# Patient Record
Sex: Female | Born: 1955 | Race: Black or African American | Hispanic: Yes | Marital: Single
Health system: Midwestern US, Community
[De-identification: ages and names within clinical notes are randomized; demographics above are authoritative.]

## PROBLEM LIST (undated history)

## (undated) DIAGNOSIS — I6350 Cerebral infarction due to unspecified occlusion or stenosis of unspecified cerebral artery: Secondary | ICD-10-CM

## (undated) DIAGNOSIS — G894 Chronic pain syndrome: Secondary | ICD-10-CM

## (undated) DIAGNOSIS — E7800 Pure hypercholesterolemia, unspecified: Secondary | ICD-10-CM

## (undated) DIAGNOSIS — M545 Low back pain, unspecified: Secondary | ICD-10-CM

## (undated) DIAGNOSIS — R627 Adult failure to thrive: Secondary | ICD-10-CM

## (undated) DIAGNOSIS — F1120 Opioid dependence, uncomplicated: Secondary | ICD-10-CM

## (undated) DIAGNOSIS — D571 Sickle-cell disease without crisis: Secondary | ICD-10-CM

## (undated) DIAGNOSIS — D5720 Sickle-cell/Hb-C disease without crisis: Secondary | ICD-10-CM

## (undated) DIAGNOSIS — D649 Anemia, unspecified: Secondary | ICD-10-CM

## (undated) DIAGNOSIS — F1010 Alcohol abuse, uncomplicated: Secondary | ICD-10-CM

## (undated) DIAGNOSIS — I639 Cerebral infarction, unspecified: Secondary | ICD-10-CM

## (undated) DIAGNOSIS — Z813 Family history of other psychoactive substance abuse and dependence: Secondary | ICD-10-CM

## (undated) DIAGNOSIS — Z9181 History of falling: Secondary | ICD-10-CM

## (undated) DIAGNOSIS — I10 Essential (primary) hypertension: Secondary | ICD-10-CM

## (undated) DIAGNOSIS — K589 Irritable bowel syndrome without diarrhea: Secondary | ICD-10-CM

## (undated) DIAGNOSIS — E560 Deficiency of vitamin E: Secondary | ICD-10-CM

## (undated) DIAGNOSIS — R471 Dysarthria and anarthria: Secondary | ICD-10-CM

## (undated) DIAGNOSIS — K219 Gastro-esophageal reflux disease without esophagitis: Secondary | ICD-10-CM

## (undated) DIAGNOSIS — M2060 Acquired deformities of toe(s), unspecified, unspecified foot: Secondary | ICD-10-CM

## (undated) DIAGNOSIS — E538 Deficiency of other specified B group vitamins: Secondary | ICD-10-CM

## (undated) DIAGNOSIS — R569 Unspecified convulsions: Secondary | ICD-10-CM

## (undated) DIAGNOSIS — E559 Vitamin D deficiency, unspecified: Secondary | ICD-10-CM

## (undated) DIAGNOSIS — M48061 Spinal stenosis, lumbar region without neurogenic claudication: Secondary | ICD-10-CM

## (undated) DIAGNOSIS — G8929 Other chronic pain: Secondary | ICD-10-CM

## (undated) DIAGNOSIS — IMO0002 Reserved for concepts with insufficient information to code with codable children: Secondary | ICD-10-CM

## (undated) DIAGNOSIS — R634 Abnormal weight loss: Secondary | ICD-10-CM

## (undated) DIAGNOSIS — R928 Other abnormal and inconclusive findings on diagnostic imaging of breast: Secondary | ICD-10-CM

## (undated) HISTORY — DX: Dysarthria and anarthria: R47.1

## (undated) HISTORY — DX: Sickle-cell disease without crisis: D57.1

## (undated) HISTORY — DX: Anemia, unspecified: D64.9

## (undated) HISTORY — DX: Acquired deformities of toe(s), unspecified, unspecified foot: M20.60

## (undated) HISTORY — DX: Vitamin D deficiency, unspecified: E55.9

## (undated) HISTORY — DX: Gastro-esophageal reflux disease without esophagitis: K21.9

## (undated) HISTORY — PX: ABDOMINAL HYSTERECTOMY: SHX81

## (undated) HISTORY — DX: Family history of other psychoactive substance abuse and dependence: Z81.3

## (undated) HISTORY — DX: Alcohol abuse, uncomplicated: F10.10

## (undated) HISTORY — DX: Deficiency of vitamin E: E56.0

## (undated) HISTORY — DX: Irritable bowel syndrome, unspecified: K58.9

## (undated) HISTORY — DX: Essential (primary) hypertension: I10

## (undated) HISTORY — DX: History of falling: Z91.81

## (undated) HISTORY — DX: Deficiency of other specified B group vitamins: E53.8

## (undated) HISTORY — DX: Unspecified convulsions: R56.9

## (undated) HISTORY — DX: Sickle-cell/Hb-C disease without crisis: D57.20

## (undated) HISTORY — DX: Cerebral infarction, unspecified: I63.9

## (undated) MED ORDER — METHOCARBAMOL 750 MG TAB
750 mg | ORAL_TABLET | Freq: Four times a day (QID) | ORAL | Status: AC
Start: ? — End: 2011-09-23

---

## 2011-01-18 NOTE — ED Provider Notes (Signed)
MEDICATION ADMINISTRATION SUMMARY              Drug Name: Levaquin, Dose Ordered: 750 mg, Route: IV Med Infusion,         Status: Given, Time: 15:55 01/18/2011,          Drug Name: Ibuprofen, Dose Ordered: 400 mg, Route: Oral, Status:         Given, Time: 14:30 01/18/2011,          Drug Name: *Sodium Chloride 0.9%, Intravenous, Dose Ordered: 1000 mL,         Route: IV Fluid, Status: Given, Time: 14:30 01/18/2011,          Drug Name: Acetaminophen, Dose Ordered: 975 mg, Route: Oral, Status:         Given, Time: 14:30 01/18/2011, *Additional information available in         notes, Detailed record available in Medication Service section.       KNOWN ALLERGIES   Penicillins       TRIAGE Lucia Bitter Jan 18, 2011 13:04 MNL1)   PATIENT: NAME: Sofie Rower, AGE: 56, GENDER: female, DOB: [birth year]         [birth year], TIME OF GREET: Wed Jan 18, 2011 12:59, SSN: [id number],         KG WEIGHT: 45.4, HEIGHT: 165cm, MEDICAL RECORD NUMBER: [phone],         ACCOUNT NUMBER: [id number], PCP: Pt Denies,. Lucia Bitter Jan 18, 2011 13:04         MNL1)   ADMISSION: URGENCY: 2, AMBULANCE: [facility], TRANSPORT:         Ambulance - BLS, DEPT: Emergency, BED: 2ED 20Iso. Lucia Bitter Jan 18, 2011         13:04 MNL1)   COMPLAINT:  Medic Feels Sick. Lucia Bitter Jan 18, 2011 13:04 MNL1)   PRESENTING COMPLAINT:  Headache, generalized body aches for 2         weeks getting progressively worse. (13:13 MNL1)   PAIN: Patient complains of pain, On a scale 0-10 patient rates         pain as 5. (13:13 MNL1)   LMP: LMP: Hysterectomy. (13:13 MNL1)   TB SCREENING: TB screen negative for this patient. (13:13         MNL1)   ABUSE SCREENING: Patient denies physical abuse or threats. (13:13         MNL1)   FALL RISK: Patient has a low risk of falling. (13:13 MNL1)   SUICIDAL IDEATION: Suicidal ideation is not present. (13:13         MNL1)   ADVANCE DIRECTIVES: Patient does not have advance directives.         (13:13 MNL1)   PROVIDERS: TRIAGE NURSE: Trecia Rogers, RN, CEN. Lucia Bitter Jan 18, 2011 13:04 MNL1)       PRESENTING PROBLEM (13:04 MNL1)      Presenting problems: Fever, Body Aches (general).       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       CURRENT MEDICATIONS (13:14 MNL1)   Patient not taking meds       MEDICATION SERVICE   Acetaminophen:  Order: Acetaminophen - Dose: 975 mg :         Oral         Ordered by: Zola Button, PA-C         Entered by: Zola Button, PA-C Wed Jan 18, 2011 14:15 ,  Acknowledged by: Trecia Rogers, RN, CEN Wed Jan 18, 2011 14:28         Documented as given by: Trecia Rogers, RN, CEN Wed Jan 18, 2011         14:30          Patient, Medication, Dose, Route and Time verified prior to         administration.          Amount given: 975mg , Site: Medication administered P.O., Correct         patient, time, route, dose and medication confirmed prior to         administration, Patient advised of actions and side-effects prior to         administration, Allergies confirmed and medications reviewed prior to         administration, Patient in position of comfort, Side rails up, Cart         in lowest position, Call light in reach.   Ibuprofen:  Order: Ibuprofen - Dose: 400 mg : Oral         Ordered by: Zola Button, PA-C         Entered by: Zola Button, PA-C Wed Jan 18, 2011 14:15 ,          Acknowledged by: Trecia Rogers, RN, CEN Wed Jan 18, 2011 14:28         Documented as given by: Trecia Rogers, RN, CEN Wed Jan 18, 2011         14:30          Patient, Medication, Dose, Route and Time verified prior to         administration.          Amount given: 400mg , Site: Medication administered P.O., Correct         patient, time, route, dose and medication confirmed prior to         administration, Patient advised of actions and side-effects prior to         administration, Allergies confirmed and medications reviewed prior to         administration, Patient in position of comfort, Side rails up, Cart          in lowest position, Call light in reach.   Levaquin:  Order: Levaquin (Levofloxacin) - Dose: 750         mg : IV Med Infusion         Ordered by: Zola Button, PA-C         Entered by: Zola Button, PA-C Wed Jan 18, 2011 15:36 ,          Acknowledged by: Trecia Rogers, RN, CEN Wed Jan 18, 2011 15:40         Documented as given by: Trecia Rogers, RN, CEN Wed Jan 18, 2011         15:55          Patient, Medication, Dose, Route and Time verified prior to         administration.          Amount given: 750mg , IV SITE #1 into left forearm, IV SITE #1 IVPB         or drip, initial infusion, Premixed, via pump tubing, on an IV pump,         at 100 ml/hr, Connections checked prior to administration, Line         traced prior to administration, Catheter placement confirmed via  flush prior to administration, IV site without signs or symptoms of         infiltration during medication administration, No swelling during         administration, No drainage during administration, IV flushed after         administration, Correct patient, time, route, dose and medication         confirmed prior to administration, Patient advised of actions and         side-effects prior to administration, Allergies confirmed and         medications reviewed prior to administration, Patient in position of         comfort, Side rails up, Cart in lowest position, Call light in reach.    : Follow Up : _IV SITE #1:_, Medication infusion discontinued,         on Wed Jan 18, 2011 17:40, Total infusion time IV site 1 1 hour, 45         minutes, ., Total amount infused: . (17:40 MNL1)   Sodium Chloride 0.9%, Intravenous:  Order: Sodium Chloride 0.9%,         Intravenous (Sodium Chloride) - Dose: 1000 mL : IV Fluid         Notes: For Hydration         Ordered by: Zola Button, PA-C         Entered by: Zola Button, PA-C Wed Jan 18, 2011 14:15 ,          Acknowledged by: Trecia Rogers, RN, CEN Wed Jan 18, 2011 14:28          Documented as given by: Trecia Rogers, RN, CEN Wed Jan 18, 2011         14:30          Patient, Medication, Dose, Route and Time verified prior to         administration.          Amount given: , IV SITE #1 into left forearm, IV SITE #1 IV         fluids established, IV SITE #1 1st bag hung, amount 1 Liter, IV SITE         #1 bolus of 1000 ml established, IV SITE #1 Rate of bolus, wide open,         via primary tubing, Connections checked prior to administration, Line         traced prior to administration, Catheter placement confirmed via         flush prior to administration, IV site without signs or symptoms of         infiltration during medication administration, No swelling during         administration, No drainage during administration, IV flushed after         administration, Correct patient, time, route, dose and medication         confirmed prior to administration, Patient advised of actions and         side-effects prior to administration, Allergies confirmed and         medications reviewed prior to administration, Patient in position of         comfort, Side rails up, Cart in lowest position, Call light in reach.    : Follow Up : _IV SITE #1:_, IV fluid infusion discontinued, on         Wed Jan 18, 2011 16:05, Total fluid hydration time IV site 1 1 hour,  35 minutes, ., Total amount infused: . (16:05 MNL1)       ORDERS   BLOOD CULTURE:  Ordered for: Truddie Crumble, MD, Rob         Status: Active. (14:00 MNL1)      Ordered for: Truddie Crumble, MD, Rob         Status: Active. (14:00 MNL1)   Apply O2 if sat &lt;92% and/or symptomatic:  Ordered for: Truddie Crumble,         MD, Rob         Status: Done by Dalene Carrow RN, CEN, Marvetta Gibbons Jan 18, 2011 14:27.         (14:00 MNL1)   CBC, MANUAL DIFFERENTIAL:  Ordered for: Truddie Crumble, MD, Rob         Status: Active. (14:00 MNL1)   COMPREHENSIVE METABOLIC PANEL:  Ordered for: Truddie Crumble, MD, Rob          Status: Done by System Wed Jan 18, 2011 15:41. (14:00 MNL1)   Acetaminophen/ibuprofen Protocol:  Ordered for: Truddie Crumble, MD,         Rob         Status: Done by Dalene Carrow RN, CEN, Marvetta Gibbons Jan 18, 2011 14:27.         (14:00 MNL1)   CHEST 2 VIEWS:  Ordered for: Truddie Crumble, MD, Rob         Status: Active. (14:00 MNL1)   IV- Saline Lock:  Ordered for: Wenda Overland, DO, Jennifer         Status: Done by Dalene Carrow RN, CEN, Marvetta Gibbons Jan 18, 2011 14:54.         (14:14 NVR)   Cardiac Monitor:  Ordered for: Wenda Overland, DO, Jennifer         Status: Done by Dalene Carrow RN, CEN, Marvetta Gibbons Jan 18, 2011 14:28.         (14:14 NVR)   HAND HELD NEBULIZER:  Ordered for: Wenda Overland, DO, Jennifer         Status: Active. (14:14 NVR)   CBC, AUTOMATED DIFFERENTIAL:  Ordered for: Wenda Overland, DO,         Jennifer         Status: Done by System Wed Jan 18, 2011 15:12. (14:14 NVR)   LIPASE:  Ordered for: Wenda Overland, DO, Jennifer         Status: Done by System Wed Jan 18, 2011 15:32. (14:14 NVR)   BP Monitor:  Ordered for: Wenda Overland, DO, Jennifer         Status: Done by Dalene Carrow RN, CEN, Marvetta Gibbons Jan 18, 2011 14:27.         (14:14 NVR)   O2 sat Monitor:  Ordered for: Wenda Overland, DO, Victorino Dike         Status: Done by Dalene Carrow RN, CEN, Marvetta Gibbons Jan 18, 2011 14:28.         (14:14 NVR)   Urine dip (send for lab U/A if positive):  Ordered for: Wenda Overland, DO, Jennifer         Status: Done by Dalene Carrow RN, CEN, Marvetta Gibbons Jan 18, 2011 14:59.         (14:15 NVR)   DILANTIN:  Ordered for: Wenda Overland, DO, Jennifer         Status: Done by System Wed Jan 18, 2011 15:33. (14:16 NVR)   Flu PCR:  Ordered for: Wenda Overland, DO, Victorino Dike         Status:  Done by System Wed Jan 18, 2011 16:55. (14:16 NVR)   Please order I-Med pump Single:  Ordered for: Wenda Overland, DO,         Jennifer         Status: Done by Deforest Hoyles Wed Jan 18, 2011 15:45. (15:41         MNL1)    REGULAR for [facility] Hospitalist/Aleti:  Ordered for: Wenda Overland,         DO, Jennifer         Status: Done by Deforest Hoyles Wed Jan 18, 2011 18:11. (18:06         JHS2)   Elita Boone IV Cath:  Ordered for: Wenda Overland, DO, Jennifer         Status: Active. (18:55 MNL1)   Pump Tubing - Smart Site:  Ordered for: Himmel Salch, DO,         Jennifer         Status: Active. (18:55 MNL1)   IV Set - Primary Tubing:  Ordered for: Himmel Salch, DO, Jennifer         Status: Active. (18:55 MNL1)   BP Cuff Adult Small:  Ordered for: Himmel Salch, DO, Jennifer         Status: Active. (18:55 MNL1)   Elita Boone IV Cath:  Ordered for: Himmel Salch, DO, Jennifer         Status: Active. (18:55 MNL1)   IV Start kit:  Ordered for: Himmel Salch, DO, Jennifer         Status: Active. (18:55 MNL1)   MONITOR ELECTRODE:  Ordered for: Himmel Salch, DO, Jennifer         Status: Active. (18:55 MNL1)   IV Start kit:  Ordered for: Himmel Salch, DO, Jennifer         Status: Active. (18:55 MNL1)       NURSING ASSESSMENT: RESPIRATORY /CHEST (14:05 MNL1)   CONSTITUTIONAL: Complex assessment performed, History obtained         from patient, Patient arrives, via stretcher, via [facility], Gait steady, Patient appears comfortable, Patient         cooperative, Patient alert, Oriented to person, place and time, Skin         warm, Skin dry, Skin normal in color, Mucous membranes pink, Mucous         membranes moist.   RESPIRATORY/CHEST: Respiratory assessment findings include         respiratory effort easy, Respirations regular, Conversing normally,         no signs of distress, Breath sounds diminished, to bilateral lower         lobes, Associated with cough, non-productive.   SAFETY: Side rails up, Cart/Stretcher in lowest position, Family         at bedside, Call light within reach, Hospital ID band on.       NURSING PROCEDURE: ADMISSION (19:51 HCF1)   ADMISSION: Patient admitted to a medical-surgical unit, room          number 5105, Report faxed to unit, Fax receipt confirmed, by Jamesetta So,         Admission orders received and completed, Bed assigned at 1950, Report         called at 1950, Patient Admited at. 52, Transported via wheelchair.   BELONGINGS: Belongings remain with patient, Valuables remain with         patient.   SAFETY: Side rails up, Cart/Stretcher in lowest  position, Call         light within reach, Hospital ID band on.       NURSING PROCEDURE: IV (14:30 MNL1)   IV SITE 1: IV therapy indicated for hydration, IV therapy         indicated for medication administration, IV established, to the left         forearm, using a 22 gauge catheter, in one attempt, Saline lock         established, Flushed with normal saline (mls): 10, Labs drawn at time         of placement, labeled in the presence of the patient and sent to lab,         Labs drawn at 1430, Blood cultures drawn at time of placement,         labeled in the presence of the patient and sent to lab, Blood         cultures drawn at 1430, Tourniquet removed from patient after         procedure., Labs labeled in the presence of the patient and then sent         to the Lab, Blood Cultures labeled in the presence of the patient and         then sent to the Lab.       NURSING PROCEDURE: LAB DRAW (15:10 MNL1)   LAB DRAW: Lab draw indicated for obtaining specimens for         evaluation, Subsequent lab draw performed, by venipuncture, from         right forearm, in one attempt, Blood cultures labeled in the presence         of the patient and sent to lab, Tourniquet removed from patient after         procedure.   SAFETY: Side rails up, Cart/Stretcher in lowest position, Call         light within reach, Hospital ID band on.       NURSING PROCEDURE: NURSE NOTES   NURSES NOTES: Patient is improving, Patient in no apparent         distress, Patient resting quietly. (18:52 MNL1)     Notes: Chart faxed to [facility]. (19:43 HCF1)      Beverage given to patient, Meal tray given to patient. (19:49 HCF1)     Beverage given to patient, Meal tray given to patient. (20:00 HCF1)       DIAGNOSIS (18:05 JHS2)   FINAL: PRIMARY: Influenza with pneumonia.       DISPOSITION   PATIENT:  Disposition Type: Inpatient, Disposition: Regular Bed         Admission, Condition: Stable. (18:05 JHS2)      Frequent Flyer: No, Disposition Transport: Accompanied by staff. (20:56         HCF1)      Patient left the department. (20:57 HCF1)       VITAL SIGNS   VITAL SIGNS: BP: 125/77 (Sitting), Pulse: 111, Resp: 23, Temp:         102.4 (Oral), Pain: 5, O2 sat: 91 on Room air, Time: 01/18/2011 13:07.         (13:07 MNL1)     BP: 140/88 (Lying), Pulse: 107, Resp: 28, Temp: 99.6 (Oral), Pain: 5, O2         sat: 95 on Room air, Time: 01/18/2011 15:22. (15:22 MNL1)     BP: 126/71 (Lying), Pulse: 90, Resp: 25, Pain: 5, O2 sat: 91 on  Room air,         Time: 01/18/2011 17:18. (17:18 MNL1)     BP: 105/59 (Sitting), Pulse: 79, Resp: 23, Pain: 5, O2 sat: 95% on Room         air, Time: 01/18/2011 19:39. (19:39 HCF1)       PRESCRIPTION     No recorded prescriptions       ADMIN (20:56 HCF1)   DIGITAL SIGNATURE:  Susann Givens, RN, CEN, Henry (Clint).   Key:     CML2=Loebe, RN, [employer]  HCF1=Franklin, RN, CEN, Sherilyn Cooter (Clint)      JHS2=Himmel Adron Bene, DO, [employer]     MNL1=Lantry, RN, CEN, [employer]  NVR=Rice, PA-C, [employer]

## 2011-01-24 NOTE — ED Provider Notes (Signed)
KNOWN ALLERGIES   Penicillins: Source: Patient       TRIAGE Halford Decamp Jan 24, 2011 10:37 LDD0)   [name] NOTES:  headache, she states she was here recently and was         diagnosed with pneumonia, no relief from meds. (Tue Jan 24, 2011         10:37 LDD0)   PATIENT: NAME: Wanda Doyle, AGE: 56, GENDER: female, DOB: [birth year]         [birth year], TIME OF GREET: Tue Jan 24, 2011 10:28, Frequent Flyer:         No, SSN: [id number], MEDICAL RECORD NUMBER: [id number], ACCOUNT NUMBER:         [id number], PCP: Pt Denies,. (Tue Jan 24, 2011 10:37 LDD0)   ADMISSION: URGENCY: 3, TRANSPORT: Ambulatory, DEPT: Emergency,         BED: WAITING. (Tue Jan 24, 2011 10:37 LDD0)   VITAL SIGNS: BP 135/79, (Sitting), Pulse 101, Resp 20, Temp 97.8,         (Oral), Pain 5, O2 Sat 99, on Room air, Time 01/24/2011 10:34. (10:34         LDD0)   COMPLAINT:  Pneumonia/Poss Flu. (Tue Jan 24, 2011 10:37         LDD0)   PRESENTING COMPLAINT:  Pneumonia/Poss Flu. (11:33 AC)   LMP: LMP: Hysterectomy. (11:33 AC)   TB SCREENING: TB screen negative for this patient. (11:33         AC)   FALL RISK: Fall risk assessment not applicable to this patient.         (11:33 AC)   SUICIDAL IDEATION: Suicidal ideation is not present. (11:33         AC)   ADVANCE DIRECTIVES: Patient does not have advance directives.         (11:33 AC)   PROVIDERS: TRIAGE NURSE: Annamary Carolin, RN. Halford Decamp Jan 24, 2011 10:37         LDD0)   PREVIOUS VISIT ALLERGIES: Penicillins. Halford Decamp Jan 24, 2011 10:37         LDD0)       PRESENTING PROBLEM (Tue Jan 24, 2011 10:37 LDD0)      Presenting problems: Headache.       CURRENT MEDICATIONS     No recorded medications       ORDERS   CHEST 2 VIEWS:  Ordered for: Arvella Merles, MD, Rolm Gala         Status: Active. (11:48 DIH0)   I-Stat Chem8+ (complete):  Ordered for: Arvella Merles, MD, Rolm Gala         Status: Done by Campbell Lerner III, [facility]) Tue Jan 24, 2011         13:43. (13:22 [facility])   Pillow Disposable:  Ordered for: Arvella Merles, MD, Rolm Gala         Status: Active. (14:31 AC)    BP Cuff Adult Regular:  Ordered for: Arvella Merles, MD, Rolm Gala         Status: Active. (14:31 AC)       NURSING ASSESSMENT: ABDOMEN (11:35 AC)   PAIN:  generalized body aches, on a scale 0-10 patient rates pain         as 5.   ABDOMEN: Abdomen assessment findings include abdomen symmetrical,         Abdomen soft, non-tender, Bowel sound normal, no associated nausea,         no associated vomiting, no associated diarrhea, no associated         constipation,  Associated with appetite change, decrease.   GENITOURINARY FEMALE: no associated urinary complaints.       NURSING ASSESSMENT: RESPIRATORY /CHEST (11:33 AC)   CONSTITUTIONAL: Complex assessment performed, History obtained         from patient, Patient arrives ambulatory, Gait steady, Patient         appears comfortable, Patient cooperative, Patient alert, Oriented to         person, place and time, Skin warm, Skin dry, Skin normal in color,         Mucous membranes pink, Mucous membranes moist, Patient is         well-groomed.   RESPIRATORY/CHEST: Respiratory assessment findings include         respiratory effort easy, Respirations regular, Conversing normally,         Breath sounds diminished, Neck and chest exam findings include         trachea midline, Chest expansion equal, Chest movement symmetrical,         Associated with cough, white sputum, Associated with fever, oral, pt         staes she has expereinced sweats but has not taken her temp, Notes:         pt was seen here on wed and was diagnosised with pneumonia and the         flu pt was prescried antibiotics and tamiflu states shehas not had         relief.   ENT: Ear assessment findings include ear normal to inspection,         Nasal assessment findings include nose normal to inspection, Sinuses         normal, Nasal mucosa normal, Mouth and throat assessment findings         include mouth inspection normal, Uvula normal, Tonsils normal, Mucous          membranes pink, and moist, Able to swallow, Speech normal, Notes: pt         reprots feeling of dry mouth.       NURSING PROCEDURE: DISCHARGE NOTE (14:31 AC)   DISCHARGE: Patient discharged to home, ambulating without         assistance, transported via taxi, unaccompanied, Discharge         instructions given to patient, Simple or moderate discharge teaching         performed, Patient treated and evaluated by physician, Notes: pt         waiting for a medicare cab.       NURSING PROCEDURE: LAB DRAW (13:43 AAD)   PATIENT IDENTIFIER: Patient's identity verified by patient         stating name, Patient's identity verified by patient stating birth         date, Patient's identity verified by hospital ID bracelet, Patient         actively involved in identification process.   LAB DRAW: Lab draw indicated for obtaining specimens for         evaluation, Initial lab draw performed, by venipuncture, from left         antecubital, in one attempt, Tourniquet removed from patient after         procedure.   FOLLOW-UP: After procedure, dressing applied to site, After         procedure, no swelling at site, After procedure, no active bleeding         from site.   NOTES: Patient tolerated procedure well.   SAFETY: Side  rails up, Cart/Stretcher in lowest position, Call         light within reach, Hospital ID band on.       DIAGNOSIS (14:10 DIH0)   FINAL: PRIMARY: resolving pneumonia, ADDITIONAL: Hyponatremia.       DISPOSITION   PATIENT:  Disposition Type: Discharged, Disposition: Discharged,         Condition: Stable. (14:10 DIH0)      Patient left the department. (14:36 AC)       VITAL SIGNS (10:34 LDD0)   VITAL SIGNS: BP: 135/79 (Sitting), Pulse: 101, Resp: 20, Temp:         97.8 (Oral), Pain: 5, O2 sat: 99 on Room air, Time: 01/24/2011 10:34.       INSTRUCTION (14:11 DIH0)   DISCHARGE:  HYPONATREMIA (LOW SODIUM).   FOLLOWUP:  Val Eagle, , MEDICINE.   SPECIAL:  follow up at the clinic          Return to the ER if condition worsens or new symptoms develop.       PRESCRIPTION     No recorded prescriptions       ADMIN (14:31 AC)   DIGITAL SIGNATURE:  Katherine Roan, RN, Marchelle Folks.   Key:     AAD=Deguzman, ACT III, [facility] (Rain)  AC=Conley, RN, Marchelle Folks  DIH0=Houle,     PA-C, Lafonda Mosses     LDD0=David, RN, Lyn

## 2011-05-20 NOTE — Procedures (Signed)
Test Reason : Chest pain   Blood Pressure : ***/*** mmHG   Vent. Rate : 069 BPM     Atrial Rate : 069 BPM      P-R Int : 174 ms          QRS Dur : 080 ms       QT Int : 394 ms       P-R-T Axes : 073 -16 058 degrees      QTc Int : 422 ms   Normal sinus rhythm   Possible Left atrial enlargement   Low voltage QRS   Borderline ECG   No previous ECGs available   Confirmed by Ashby, M.D., Charles Jr. ([id number]) on 05/21/2011 7:31:37 PM   Referred By:             Overread By: Charles J    Ashby, M.D.

## 2011-05-20 NOTE — Procedures (Signed)
Test Reason : Chest pain   Blood Pressure : ***/*** mmHG   Vent. Rate : 069 BPM     Atrial Rate : 069 BPM      P-R Int : 174 ms          QRS Dur : 080 ms       QT Int : 394 ms       P-R-T Axes : 073 -16 058 degrees      QTc Int : 422 ms   Normal sinus rhythm   Possible Left atrial enlargement   Low voltage QRS   Borderline ECG   No previous ECGs available   Confirmed by Cleon Gustin, M.D., Madelynn Done. (30) on 05/21/2011 7:31:37 PM   Referred By:             Overread By: Merlene Pulling, M.D.

## 2011-05-20 NOTE — ED Provider Notes (Signed)
MEDICATION ADMINISTRATION SUMMARY              Drug Name: Morphine Sulfate, Dose Ordered: 4 mg, Route: IV Push,         Status: Given, Time: 23:53 05/20/2011,          Drug Name: Ibuprofen, Dose Ordered: 800 mg, Route: Oral, Status:         Given, Time: 23:53 05/20/2011, Detailed record available in Medication         Service section.       KNOWN ALLERGIES   Penicillins: Source: Patient       TRIAGE (Sat May 20, 2011 20:59 MAA2)   PATIENT: NAME: Wanda Doyle, AGE: 56, GENDER: female, DOB: Sun         1955/03/21, TIME OF GREET: Sat May 20, 2011 20:47, LANGUAGE:         English, Frequent Flyer: No, SSN: [id number], MEDICAL RECORD NUMBER:         [id number], ACCOUNT NUMBER: [id number], PCP: Pt Denies,. (Sat May 20, 2011         20:59 MAA2)   ADMISSION: URGENCY: 3, TRANSPORT: Ambulance - ALS, DEPT:         Emergency, BED: *QCC 08. (Sat May 20, 2011 20:59 MAA2)   COMPLAINT:  Medic; Cp. (Sat May 20, 2011 20:59 MAA2)   PRESENTING COMPLAINT:  L sided CP with radiation to L shoulder,         began a few days ago and hurts to palpation. (21:56 EAG1)   PAIN: Patient complains of pain, Pain described as sharp, On a         scale 0-10 patient rates pain as 8, L sided CP, Pain is intermittent,         Onset was a few days. (21:56 EAG1)     Exacerbating factors include Palpation. (21:56 EAG1)   LMP: LMP: Hysterectomy. (21:56 EAG1)   TREATMENT PRIOR TO ARRIVAL: Medication: NTG/ASA 324mg , Site: L         hand, Gauge: 20. (21:56 EAG1)   TB SCREENING: TB screen negative for this patient. (21:56         EAG1)   ABUSE SCREENING: Patient denies physical abuse or threats. (21:56         EAG1)   FALL RISK: Patient has a low risk of falling. (21:56 EAG1)   SUICIDAL IDEATION: Suicidal ideation is not present. (21:56         EAG1)   ADVANCE DIRECTIVES: Patient does not have advance directives.         (21:56 EAG1)   PROVIDERS: TRIAGE NURSE: Bennetta Laos, RN, MSN. [phone] May 20, 2011         20:59 MAA2)    PREVIOUS VISIT ALLERGIES: Penicillins. (Sat May 20, 2011 20:59         MAA2)       PRESENTING PROBLEM (Sat May 20, 2011 20:59 MAA2)      Presenting problems: Chest Pain - Adult.       CURRENT MEDICATIONS   Ventolin HFA:  2 puff(s) Oral As needed every four hours. (21:59         EAG1)   Flovent HFA:  2 puff(s) Oral 2 times a day. (21:59 EAG1)   Tobradex:  1 Drop(s) Eye 2 times a day. (22:00 EAG1)   Phenytoin:  100 mg Oral 2 times a day. (22:01 EAG1)   Pulmicort Flexhaler:  1 puff(s) Oral 2 times a day. (22:02  EAG1)   Keflex:  500 mg Oral every 6 hours. (22:02 EAG1)       MEDICATION SERVICE (23:53 ZOX0)   Ibuprofen:  Order: Ibuprofen - Dose: 800 mg : Oral         Ordered by: Vinnie Langton, MD         Entered by: Vinnie Langton, MD Sat May 20, 2011 22:55 ,          Acknowledged by: Berton Lan, LPN Sat May 20, 2011 23:15         Documented as given by: Berton Lan, LPN Sat May 20, 2011 23:53          Patient, Medication, Dose, Route and Time verified prior to         administration.          Amount given: 800mg , Site: Medication administered P.O., Correct         patient, time, route, dose and medication confirmed prior to         administration, Patient advised of actions and side-effects prior to         administration, Allergies confirmed and medications reviewed prior to         administration, Patient in position of comfort, Side rails up, Cart         in lowest position, Call light in reach.   Morphine Sulfate:  Order: Morphine Sulfate - Dose: 4 mg         : IV Push         Ordered by: Vinnie Langton, MD         Entered by: Vinnie Langton, MD Sat May 20, 2011 22:55 ,          Acknowledged by: Berton Lan, LPN Sat May 20, 2011 23:15         Documented as given by: Berton Lan, LPN Sat May 20, 2011 23:53          Patient, Medication, Dose, Route and Time verified prior to         administration.          Amount given: 4mg , IV SITE #2 into left antecubital, IVP, Initial          medication, Slowly, Connections checked prior to administration, Line         traced prior to administration, Catheter placement confirmed via         flush prior to administration, IV site without signs or symptoms of         infiltration during medication administration, No swelling during         administration, No drainage during administration, IV flushed after         administration, Correct patient, time, route, dose and medication         confirmed prior to administration, Patient advised of actions and         side-effects prior to administration, Allergies confirmed and         medications reviewed prior to administration, Patient in position of         comfort, Side rails up, Cart in lowest position, Call light in reach.       ORDERS   Monitor strip:  Ordered for: Arvella Merles, MD, Rolm Gala         Status: Done by Wallene Huh III, [place] Sat May 20, 2011 21:21.         (21:00 MAA2)   O2 sat Monitor:  Ordered for: Arvella Merles, MD, Rolm Gala  Status: Done by Hollice Espy, LPN, [facility] May 20, 2011 21:02. (21:00         MAA2)   IV- Saline Lock:  Ordered for: Arvella Merles, MD, Rolm Gala         Status: Done by Hollice Espy, LPN, [facility] May 20, 2011 21:01. (21:00         MAA2)   MYOGLOBIN (BLOOD):  Ordered for: Arvella Merles, MD, Rolm Gala         Status: Done by System Sat May 20, 2011 21:54. (21:00 MAA2)   CPK PROFILE:  Ordered for: Arvella Merles, MD, Rolm Gala         Status: Done by System Sat May 20, 2011 21:54. (21:00 MAA2)   Cardiac Monitor:  Ordered for: Arvella Merles, MD, Rolm Gala         Status: Done by Hollice Espy, LPN, [facility] May 20, 2011 21:01. (21:00         MAA2)   BASIC METABOLIC PANEL:  Ordered for: Arvella Merles, MD, Rolm Gala         Status: Done by System Sat May 20, 2011 21:54. (21:00 MAA2)   CBC, AUTOMATED DIFFERENTIAL:  Ordered for: Arvella Merles, MD, Rolm Gala         Status: Done by System Sat May 20, 2011 21:32. (21:00 MAA2)   O2 2L NC:  Ordered for: Arvella Merles, MD, Rolm Gala         Status: Done by Hollice Espy, LPN, [facility] May 20, 2011 21:02. (21:00         MAA2)    TROPONIN I:  Ordered for: Arvella Merles, MD, Rolm Gala         Status: Done by System Sat May 20, 2011 21:54. (21:00 MAA2)   BP Monitor:  Ordered for: Arvella Merles, MD, Rolm Gala         Status: Done by Hollice Espy, LPN, [facility] May 20, 2011 21:01. (21:00         MAA2)   12 LEAD EKG:  Ordered for: Arvella Merles, MD, Rolm Gala         Status: Active. (21:00 MAA2)   CHEST 2 VIEWS:  Ordered for: Arvella Merles, MD, Rolm Gala         Status: Active. (21:56 [email or web address])   D-DIMER:  Ordered for: Arvella Merles, MD, Rolm Gala         Status: Active. (21:56 ZOX0)   CTA Chest:  Ordered for: Arvella Merles, MD, Rolm Gala         Status: Active         Comment: Suspect pulmonary embolism. (22:48 EHK1)   BP Cuff Adult Regular:  Ordered for: Arvella Merles, MD, Rolm Gala         Status: Active. Wynelle Link May 21, 2011 00:03 TLD0)   IV Start kit:  Ordered for: Arvella Merles, MD, Rolm Gala         Status: Active. Wynelle Link May 21, 2011 00:03 TLD0)   MONITOR ELECTRODE:  Ordered for: Arvella Merles, MD, Rolm Gala         Status: Active. Wynelle Link May 21, 2011 00:03 TLD0)   CONTINUOUS PULSE OX:  Ordered for: Arvella Merles, MD, Rolm Gala         Status: Active. Wynelle Link May 21, 2011 00:03 TLD0)   Elita Boone IV Cath:  Ordered for: Arvella Merles, MD, Rolm Gala         Status: Active. Wynelle Link May 21, 2011 00:03 TLD0)      Ordered for: Arvella Merles, MD, Rolm Gala         Status: Active. Wynelle Link May 21, 2011 00:03 TLD0)       NURSING ASSESSMENT: CARDIOVASCULAR Wynelle Link May 21, 2011 00:01 PTS0)   CONSTITUTIONAL: Complex assessment performed, History obtained         from patient, Patient arrives ambulatory, Gait steady, Patient         appears comfortable, Patient cooperative, Patient alert, Oriented to         person, place and time, Skin warm, Skin dry, Skin normal in color,         Mucous membranes pink, Mucous membranes moist, Patient, poorly         groomed, with poor personal hygiene, improperly dressed for the         weather.   PAIN: sharp pain, to the left chest, Pain radiates, to the left         shoulder, Onset of pain a few days ago, on a scale 0-10 patient rates         pain as 8, Pain exacerbated by, palpation.    CARDIOVASCULAR: Cardiovascular assessment findings include heart         rate normal, Heart rhythm normal sinus, Heart sounds normal, S1, S2,         No history of pulmonary embolism, No history of DVT or leg swelling.   RESPIRATORY/CHEST: Respiratory assessment findings include         respiratory effort easy, Respirations regular, Conversing normally,         no signs of distress, Breath sounds clear, to bilateral upper lobes,         to bilateral lower lobes, Tenderness, to L Chest Wall.   SAFETY: Side rails up, Cart/Stretcher in lowest position, Call         light within reach, Hospital ID band on.       NURSING PROCEDURE: DISCHARGE NOTE Wynelle Link May 21, 2011 03:10 EAG1)   DISCHARGE: Patient discharged to home, ambulating without         assistance, transported via taxi, unaccompanied, Discharge         instructions given to patient, Simple or moderate discharge teaching         performed, by Arvella Merles, MD, Prescriptions given and instructions on side         effects given, Name of prescription(s) given: Oxycodone, Above         person(s) verbalized understanding of discharge instructions and         follow-up care, Notes: Pt is waiting for Medicaid cab in waiting         room, Cab was called at 0305, expected ETA of 0500.   IV FOLLOW-UP: After procedure, no drainage at IV site, After         procedure, no swelling at IV site, After procedure, no redness at IV         site, IV discontinued, due to patient being discharged, catheter         intact.   SAFETY: Side rails up, Cart/Stretcher in lowest position, Call         light within reach, Hospital ID band on.       NURSING PROCEDURE: EKG CHART (20:59 EAG1)   PATIENT IDENTIFIER: Patient's identity verified by patient         stating name, Patient's identity verified by hospital ID bracelet.   EKG: EKG indicated for complaint of chest pain, 12 lead EKG         performed on the left chest, done by Sampson Si, LPN, first EKG.    FOLLOW-UP: After procedure, EKG for interpretation given to Dr.  Dr. Ashley Royalty, MD, EKG was given to Dr. at 2055, Notes: EKG performed         at 2054.   SAFETY: Side rails up, Cart/Stretcher in lowest position, Call         light within reach, Hospital ID band on.       NURSING PROCEDURE: IV (23:22 TLD0)   IV SITE 2: IV therapy indicated for medication administration, IV         established, to the left antecubital, using a 20 gauge catheter, in         two attempts, Saline lock established, Flushed with normal saline         (mls): , Tourniquet removed from patient after procedure.,         Notes: 20G IV NEEDED FOR CT.   NOTES: Patient tolerated procedure well.   SAFETY: Side rails up, Cart/Stretcher in lowest position, Family         at bedside, Hospital ID band on.       NURSING PROCEDURE: LAB DRAW (22:28 EAG1)   PATIENT IDENTIFIER: Patient's identity verified by patient         stating name, Patient's identity verified by hospital ID bracelet.   LAB DRAW: Subsequent lab draw performed, by venipuncture, from         right antecubital, in one attempt, Labs were drawn at 2225, Lab         specimens labeled in the presence of the patient and sent to lab.   FOLLOW-UP: After procedure, dressing applied to site, After         procedure, no swelling at site, After procedure, no active bleeding         from site.   SAFETY: Side rails up, Cart/Stretcher in lowest position, Call         light within reach, Hospital ID band on.       NURSING PROCEDURE: TRANSPORT TO TESTS   PATIENT IDENTIFIER: Patient's identity verified by patient         stating name, Patient's identity verified by patient stating birth         date, Patient's identity verified by hospital ID bracelet. (22:01         CSG2)   TRANSPORT TO TESTS: Patient transported to x-ray, via wheelchair,         Accompanied by x-ray technician, JTS. (22:01 CSG2)   FOLLOW-UP: After procedure, patient returned to emergency          department, Notes: JTS. (22:06 CSG2)       DIAGNOSIS Wynelle Link May 21, 2011 00:32 [facility])   FINAL: PRIMARY: Pleuritic chest pain.       DISPOSITION   PATIENT:  Disposition Type: Discharged, Disposition: Discharge,         Condition: Stable. Wynelle Link May 21, 2011 00:32 [facility])      Patient left the department. Wynelle Link May 21, 2011 03:12 EAG1)       VITAL SIGNS   VITAL SIGNS: BP: 139/82 (Lying), Pulse: 78, Resp: 17, Temp: 98.         (Oral), Pain: 8, O2 sat: 98 on Room air, Time: 05/20/2011 21:07.         (21:07 TLD0)     BP: 142/79 (Lying), Pulse: 68, Resp: 16, Pain: 8, O2 sat: 100 on Room         air, Time: 05/20/2011 21:57. (21:57 EAG1)     BP: 178/97 (Lying), Pulse: 67, Resp: 14, Pain: 7, O2 sat: 100  on Room         air, Time: 05/20/2011 23:49. (23:49 EAG1)     BP: 153/74, Pulse: 72, Resp: 12, Pain: 5, Time: 05/21/2011 03:04. Wynelle Link May 21, 2011 03:04 EAG1)       INSTRUCTION Wynelle Link May 21, 2011 00:33 [facility])   DISCHARGE:  PLEURISY - WITH SPIRAL CT TO R/O PE (PLEURITIC CHEST         PAIN), PLEURISY [facility]).   FOLLOWUPErnst Spell, GMD, [address] [place] [id number], [place]         [id number], [phone].   SPECIAL:  Return if vomit, fever, more pain, short of breath         Follow up with primary care physician         Return to the ER if condition worsens or new symptoms develop.         Take 600 mg (3 tabs) ibuprofen OTC 3 times daily for inflammation         Do not drive or operate machinery while medicated         Advance activity as tolerated         No heavy lifting.       PRESCRIPTION Wynelle Link May 21, 2011 00:32 [facility])   Oxycodone Hydrochloride:  Capsule : 5 mg : Oral : Quantity: ***         1-2 *** Unit: pill Route: Oral Schedule: As needed every 6 hours         Dispense: *** 15 ***.   NOTES:  No refills          May use generic.   Key:     CSG2=Guise, RAD TECH, Chris  EAG1=Gibson, LPN, Alene Mires, MD,     Rolm Gala     MAA2=Alcos, RN, MSN, Mariana Kaufman  PTS0=Smith, RN, Boyd Kerbs  TLD0=Dunbar, ACT III,     [place]ette

## 2011-06-25 NOTE — ED Provider Notes (Signed)
MEDICATION ADMINISTRATION SUMMARY              Drug Name: Cephalexin Monohydrate, Dose Ordered: 500 mg, Route: Oral,         Status: Given, Time: 02:34 06/26/2011,          Drug Name: Benadryl, Dose Ordered: 50 mg, Route: Oral, Status: Given,         Time: 02:34 06/26/2011,          Drug Name: Pepcid, Dose Ordered: 20 mg, Route: IV Push, Status:         Given, Time: 22:42 06/25/2011,          Drug Name: DiphenhydrAMINE Hydrochloride, Dose Ordered: 25 mg, Route:         IV Push, Status: Given, Time: 22:40 06/25/2011,          Drug Name: Solu-Medrol, Dose Ordered: 125 mg, Route: IV Push, Status:         Given, Time: 22:38 06/25/2011, Detailed record available in Medication         Service section.       KNOWN ALLERGIES   Penicillins: Source: Patient       [name] Wynelle Link Jun 25, 2011 21:22 MWW1)   PATIENT: NAME: Wanda Doyle, AGE: 56, GENDER: female, DOB: Sun         02/08/1955, TIME OF GREET: Sun Jun 25, 2011 21:17, LANGUAGE:         English, Frequent Flyer: No, SSN: [id number], MEDICAL RECORD NUMBER:         [id number], ACCOUNT NUMBER: [id number], PCP: Pt Denies,. Wynelle Link Jun 25, 2011         21:22 MWW1)   ADMISSION: URGENCY: 3, AMBULANCE: [facility], TRANSPORT:         Ambulance - BLS, DEPT: Emergency, BED: 2ED 31. Wynelle Link Jun 25, 2011         21:22 MWW1)   VITAL SIGNS: BP 151/90, (Sitting), Pulse 73, Resp 16, Temp 98.4,         (Oral), Pain 8, O2 Sat 99%, on Room air, Time 06/25/2011 21:21. (21:21         MWW1)   COMPLAINT:  Medic Back Pain/Face Pain. Wynelle Link Jun 25, 2011 21:22         MWW1)   PRESENTING COMPLAINT:  hives to face and chronic back pain.         (21:24 MWW1)   PAIN: Patient complains of pain, Pain described as burning, On a         scale 0-10 patient rates pain as 8, face, Pain is constant. (21:24         MWW1)   TB SCREENING: TB screen negative for this patient. (21:24         MWW1)   ABUSE SCREENING: Patient denies physical abuse or threats. (21:24         MWW1)    FALL RISK: Patient has a low risk of falling, Patient has no         history of falling (0), No secondary diagnosis (0), None/bed         rest/nurse assist (0), No IV or IV access (0), Normal/bed         rest/wheelchair (0), Oriented to own ability (0), Total 0. (21:24         MWW1)   SUICIDAL IDEATION: Suicidal ideation is not present. (21:24         MWW1)   ADVANCE DIRECTIVES: Patient does not have advance directives.         (  21:24 MWW1)   PROVIDERS: TRIAGE NURSE: Hoyle Sauer, RN. Wynelle Link Jun 25, 2011         21:22 MWW1)   PREVIOUS VISIT ALLERGIES: Penicillins. Wynelle Link Jun 25, 2011 21:22         MWW1)       PRESENTING PROBLEM Wynelle Link Jun 25, 2011 21:22 MWW1)      Presenting problems: Allergic Reaction - Minor.       AMBULANCE (20:49 SNF2)   AMBULANCE: Ambulance: Sun Jun 25, 2011 20:49.       CURRENT MEDICATIONS   Pulmicort Flexhaler:  1 puff(s) Oral 2 times a day. (21:22         MWW1)   Tobradex:  1 Drop(s) Eye 2 times a day. (21:22 MWW1)   Phenytoin:  100 mg Oral 2 times a day. (21:22 MWW1)   Ventolin HFA:  2 puff(s) Oral As needed every four hours. (21:22         MWW1)   Flovent HFA:  2 puff(s) Oral 2 times a day. (21:22 MWW1)   Alendronate:  70 mg once a week. (21:23 MWW1)   Ketoconazole Topical:  once a day. (21:23 MWW1)   Prilosec:  20 mg once a day. (21:23 MWW1)       MEDICATION SERVICE   Benadryl:  Order: Benadryl (Diphenhydramine Hydrochloride) -         Dose: 50 mg : Oral         Ordered by: Farrel Demark, PA-C         Entered by: Farrel Demark, PA-C Mon Jun 26, 2011 02:07 ,          Acknowledged by: Hoyle Sauer, RN Mon Jun 26, 2011 02:09         Documented as given by: Hoyle Sauer, RN Mon Jun 26, 2011 02:34          Patient, Medication, Dose, Route and Time verified prior to         administration.          Amount given: 50mg , Site: Medication administered P.O., Correct         patient, time, route, dose and medication confirmed prior to          administration, Patient advised of actions and side-effects prior to         administration, Allergies confirmed and medications reviewed prior to         administration, Patient in position of comfort, Side rails up, Cart         in lowest position, Call light in reach.   Cephalexin Monohydrate:  Order: Cephalexin Monohydrate         (Cephalexin (As Monohydrate)) - Dose: 500 mg : Oral         POTENTIAL ALLERGY REACTION: 'Penicillins' - Not a true allergy         Ordered by: Farrel Demark, PA-C         Entered by: Farrel Demark, PA-C Mon Jun 26, 2011 02:08 ,          Acknowledged by: Hoyle Sauer, RN Mon Jun 26, 2011 02:08         Documented as given by: Hoyle Sauer, RN Mon Jun 26, 2011 02:34          Patient, Medication, Dose, Route and Time verified prior to         administration.          Amount given: 500mg , Site: Medication administered P.O., Correct  patient, time, route, dose and medication confirmed prior to         administration, Patient advised of actions and side-effects prior to         administration, Allergies confirmed and medications reviewed prior to         administration, Patient in position of comfort, Side rails up, Cart         in lowest position, Call light in reach.   DiphenhydrAMINE Hydrochloride:  Order: DiphenhydrAMINE         Hydrochloride (Diphenhydramine Hydrochloride) - Dose: 25 mg         : IV Push         Ordered by: Farrel Demark, PA-C         Entered by: Farrel Demark, PA-C Sun Jun 25, 2011 22:30          Documented as given by: Hoyle Sauer, RN Sun Jun 25, 2011 22:40          Patient, Medication, Dose, Route and Time verified prior to         administration.          Amount given: 25mg , IV SITE #1 into right antecubital, IV SITE #1         IVP, subsequent different medication, Slowly, Connections checked         prior to administration, Line traced prior to administration,         Catheter placement confirmed via flush prior to administration, IV          site without signs or symptoms of infiltration during medication         administration, No swelling during administration, No drainage during         administration, IV flushed after administration, Correct patient,         time, route, dose and medication confirmed prior to administration,         Patient advised of actions and side-effects prior to administration,         Allergies confirmed and medications reviewed prior to administration,         Patient in position of comfort, Side rails up, Cart in lowest         position, Call light in reach.   Pepcid:  Order: Pepcid (Famotidine) - Dose: 20 mg : IV         Push         Ordered by: Farrel Demark, PA-C         Entered by: Farrel Demark, PA-C Sun Jun 25, 2011 22:30          Documented as given by: Hoyle Sauer, RN Sun Jun 25, 2011 22:42          Patient, Medication, Dose, Route and Time verified prior to         administration.          Amount given: 20mg , IV SITE #1 into right antecubital, IV SITE #1         IVP, subsequent different medication, Slowly, Connections checked         prior to administration, Line traced prior to administration,         Catheter placement confirmed via flush prior to administration, IV         site without signs or symptoms of infiltration during medication         administration, No swelling during administration, No drainage during         administration, IV  flushed after administration, Correct patient,         time, route, dose and medication confirmed prior to administration,         Patient advised of actions and side-effects prior to administration,         Allergies confirmed and medications reviewed prior to administration,         Patient in position of comfort, Side rails up, Cart in lowest         position, Call light in reach.   Solu-Medrol:  Order: Solu-Medrol (Methylprednisolone Sodium         Succinate) - Dose: 125 mg : IV Push         Ordered by: Farrel Demark, PA-C          Entered by: Farrel Demark, PA-C Sun Jun 25, 2011 22:30          Documented as given by: Hoyle Sauer, RN Sun Jun 25, 2011 22:38          Patient, Medication, Dose, Route and Time verified prior to         administration.          Amount given: 125mg , IV SITE #1 into right antecubital, IV SITE #1         IVP, initial medication, Slowly, Connections checked prior to         administration, Line traced prior to administration, Catheter         placement confirmed via flush prior to administration, IV site         without signs or symptoms of infiltration during medication         administration, No swelling during administration, No drainage during         administration, IV flushed after administration, Correct patient,         time, route, dose and medication confirmed prior to administration,         Patient advised of actions and side-effects prior to administration,         Allergies confirmed and medications reviewed prior to administration,         Patient in position of comfort, Side rails up, Cart in lowest         position, Family at bedside, Call light in reach.       ORDERS   IV- Saline Lock:  Ordered for: Sherlon Handing, M.D., Christiane Ha         Status: Done by Kennon Portela, ACT III, Mathis Fare [facility]) Sun Jun 25, 2011         22:33. (22:29 DIH0)   Blood Glucose:  Ordered for: Sherlon Handing, M.D., Christiane Ha         Status: Done by Kennon Portela, ACT III, Mathis Fare [facility]) Sun Jun 25, 2011         22:36. (22:29 [facility])   Elita Boone IV Cath:  Ordered for: Sherlon Handing, M.D., Christiane Ha         Status: Active. [facility] Jun 26, 2011 02:43 MWW1)   IV Start kit:  Ordered for: Sherlon Handing, M.D., Christiane Ha         Status: Active. (Mon Jun 26, 2011 02:43 MWW1)       NURSING ASSESSMENT: ALLERGIC REACTION (21:25 MWW1)   CONSTITUTIONAL: History obtained from patient, Patient arrives,         via stretcher, via [facility], Gait steady, Patient         appears comfortable, Patient cooperative, Patient alert, Oriented to          person,  place and time, Skin warm, Skin dry, Skin normal in color,         Mucous membranes pink, Mucous membranes moist, Patient is         well-groomed.   ALLERGIC REACTION: Allergic reaction to unknown allergen,         Allergic reaction symptoms include no difficulty breathing, Allergic         reaction symptoms include no difficulty swallowing, Allergic reaction         symptoms include hives, Allergic reaction symptoms include no         wheezing, Notes: pt c/o hives with burning to face. states began         using medication for yeast infection 3 days ago and noted         blistering/hives to face today.   RESPIRATORY: Respiratory assessment findings include respiratory         effort easy, Respirations regular, Conversing normally, no signs of         distress, Breath sounds clear, to bilateral upper lobes, to bilateral         lower lobes.   SAFETY: Side rails up, Cart/Stretcher in lowest position, Call         light within reach, Hospital ID band on.       NURSING PROCEDURE: DISCHARGE NOTE (Mon Jun 26, 2011 02:44 MWW1)   DISCHARGE: Patient discharged to home, ambulating without         assistance, transported via taxi, unaccompanied, Discharge         instructions given to patient, Simple or moderate discharge teaching         performed, Prescriptions given and instructions on side effects         given, Above person(s) verbalized understanding of discharge         instructions and follow-up care.   BELONGINGS: Belongings remain with patient, Valuables remain with         patient.       NURSING PROCEDURE: IV   PATIENT IDENITIFIER: Patient's identity verified by patient         stating name, Patient's identity verified by patient stating birth         date, Patient's identity verified by hospital ID bracelet, Patient         actively involved in identification process. (22:36 AAD)   IV SITE 1: IV therapy indicated for hydration, IV therapy          indicated for medication administration, IV established, to the right         antecubital, using a 20 gauge catheter, in one attempt, Tourniquet         removed from patient after procedure., Notes: iv put in by student.         (22:36 AAD)   FOLLOW-UP SITE 1: After procedure, sterile transparent dressing         applied, After procedure, no drainage at IV site, After procedure, no         swelling at IV site, After procedure, no redness at IV site. (22:36         AAD)     IV discontinued, due to patient being discharged, catheter intact. [facility]         Jun 26, 2011 01:55 MWW1)   NOTES: Patient tolerated procedure well. (22:36 AAD)   SAFETY: Side rails up, Cart/Stretcher in lowest position, Call         light within reach, [facility]  ID band on. (22:36 AAD)       NURSING PROCEDURE: NURSE NOTES   NURSES NOTES: Patient in no apparent distress, Patient resting         quietly. (22:56 MWW1)     Notes: pt resting with no distress noted. Sheral Flow Jun 26, 2011 00:00         MWW1)       DIAGNOSIS (Mon Jun 26, 2011 01:46 DIH0)   FINAL: PRIMARY: rash face, ADDITIONAL: possible impetigo.       DISPOSITION   PATIENT:  Disposition Type: Discharged, Disposition: Discharge,         Condition: Stable. Sheral Flow Jun 26, 2011 01:46 [facility])      Patient left the department. [facility] Jun 26, 2011 02:45 MWW1)       VITAL SIGNS   VITAL SIGNS: BP: 151/90 (Sitting), Pulse: 73, Resp: 16, Temp:         98.4 (Oral), Pain: 8, O2 sat: 99% on Room air, Time: 06/25/2011 21:21.         (21:21 MWW1)     BP: 156/79 (Sitting), Pulse: 76, Resp: 16, Pain: 7, O2 sat: 98 on Room         air, Time: 06/25/2011 23:00. (23:00 AAD)     BP: 149/76, Pulse: 65, Resp: 16, Pain: 6, O2 sat: 100 on Room air, Time:         06/26/2011 01:52. (Mon Jun 26, 2011 01:52 [facility])       INSTRUCTION (Mon Jun 26, 2011 01:45 DIH0)   DISCHARGE:  IMPETIGO (SKIN INFECTION, STAPH INFECTION).   [address], [place]          [place] [address], [phone], Ivin Booty, DERMATOLOGY, [address]         [address], [place] [address], [phone].       PRESCRIPTION   Keflex:  Capsule : monohydrate 500 mg : Oral : Quantity: *** 1         *** Unit: Route: Oral Schedule: 4 times a day Dispense: *** 40 ***         POTENTIAL ALLERGY REACTION: 'Penicillins'         Override Rationale: Not a true allergy. Sheral Flow Jun 26, 2011 01:42         [facility])   NOTES:  No refills          May use generic. (Mon Jun 26, 2011 01:42 DIH0)   HydrOXYzine Hydrochloride:  Tablet : Hydrochloride 25 Mg : Oral :         Quantity: *** 1 *** Unit: tab(s) Route: Oral Schedule: As needed         every 6 hours Dispense: *** 24 ***. (Mon Jun 26, 2011 01:43         [facility])   NOTES:  prn itch          No refills          May use generic. [facility] Jun 26, 2011 01:43 [facility])       ADMIN (Mon Jun 26, 2011 02:45 MWW1)   DIGITAL SIGNATURE:  Claudette Laws, RN, Marcelino Duster.   Key:     AAD=Deguzman, ACT III, Mathis Fare (Rain)  DIH0=Houle, PA-C, Lafonda Mosses      MCC=Childs, ACT III, Huey Bienenstock)     MWW1=Watson, RN, Marcelino Duster  SNF2=Fleming, RN, Talbert Forest

## 2011-06-27 NOTE — ED Provider Notes (Signed)
MEDICATION ADMINISTRATION SUMMARY              Drug Name: Dexamethasone Sodium Phosphate, Dose Ordered: 10 mg,         Route: Intramuscular, Status: Given, Time: 21:47 06/27/2011,          Drug Name: DiphenhydrAMINE Hydrochloride, Dose Ordered: 50 mg, Route:         Intramuscular, Status: Given, Time: 21:47 06/27/2011, Detailed record         available in Medication Service section.       KNOWN ALLERGIES   Penicillins: Source: Patient       [name] (Tue Jun 27, 2011 21:08 MAA2)   PATIENT: NAME: Wanda Doyle, AGE: 56, GENDER: female, DOB: [birth year]         October 30, 1955, TIME OF GREET: Tue Jun 27, 2011 21:06, LANGUAGE:         English, Frequent Flyer: No, SSN: [id number], MEDICAL RECORD NUMBER:         [id number], ACCOUNT NUMBER: [id number], PCP: Pt Denies,. (Tue Jun 27, 2011         21:08 MAA2)   ADMISSION: URGENCY: 2, TRANSPORT: Ambulance - ALS, DEPT:         Emergency, BED: 2ED 32. (Tue Jun 27, 2011 21:08 MAA2)   COMPLAINT:  Medic Allergic Reaction. (Tue Jun 27, 2011 21:08         MAA2)   PRESENTING COMPLAINT:  Hives all over the body, mostly face and         back.Pateint keep taking prilosec and not sure if this medicine         causing the allergy. (21:09 MAA2)      allergic reaction. (21:33 TKT1)   PAIN: No complaint of pain. (21:33 TKT1)   TREATMENT PRIOR TO ARRIVAL: None. (21:33 TKT1)   TB SCREENING: TB screen negative for this patient. (21:33         TKT1)   ABUSE SCREENING: Patient denies physical abuse or threats. (21:33         TKT1)   FALL RISK: Patient has a low risk of falling. (21:33 TKT1)   SUICIDAL IDEATION: Suicidal ideation is not present. (21:33         TKT1)   ADVANCE DIRECTIVES: Patient does not have advance directives.         (21:33 TKT1)   PROVIDERS: TRIAGE NURSE: Bennetta Laos, RN, MSN. (Tue Jun 27, 2011         21:08 MAA2)   PREVIOUS VISIT ALLERGIES: Penicillins. (Tue Jun 27, 2011 21:08         MAA2)       PRESENTING PROBLEM (21:08 MAA2)      Presenting problems: Allergic Reaction - Major.        CURRENT MEDICATIONS (21:35 TKT1)   Prilosec:  20 mg once a day.   Ketoconazole Topical:  once a day.   Pulmicort Flexhaler:  1 puff(s) Oral 2 times a day.   Flovent HFA:  2 puff(s) Oral 2 times a day.   Phenytoin:  100 mg Oral 2 times a day.   Alendronate:  70 mg once a week.   Hydrochlorothiazide:  1 tab Oral once a day. x 25 mg - Oral -         QDAY.   Ventolin HFA:  2 puff(s) Oral As needed every four hours.   HydrOXYzine Hydrochloride:  1 tab(s) Oral As needed every 6         hours. x Hydrochloride  25 Mg - Oral - Q6PRN.   Tobradex:  1 Drop(s) Eye 2 times a day.       MEDICATION SERVICE (21:47 BAF)   Dexamethasone Sodium Phosphate:  Order: Dexamethasone Sodium         Phosphate - Dose: 10 mg : Intramuscular         Ordered by: Dixie Dials, MD         Entered by: Dixie Dials, MD Tue Jun 27, 2011 21:30 ,          Acknowledged by: Paulita Fujita Tue Jun 27, 2011 21:36         Documented as given by: Paulita Fujita Tue Jun 27, 2011 21:47          Patient, Medication, Dose, Route and Time verified prior to         administration.         IM medication, Amount given: 10mg , Medication administered to left         deltoid, Correct patient, time, route, dose and medication confirmed         prior to administration, Patient advised of actions and side-effects         prior to administration, Allergies confirmed and medications reviewed         prior to administration.   DiphenhydrAMINE Hydrochloride:  Order: DiphenhydrAMINE         Hydrochloride (Diphenhydramine Hydrochloride) - Dose: 50 mg         : Intramuscular         Ordered by: Dixie Dials, MD         Entered by: Dixie Dials, MD Tue Jun 27, 2011 21:30 ,          Acknowledged by: Paulita Fujita Tue Jun 27, 2011 21:36         Documented as given by: Paulita Fujita Tue Jun 27, 2011 21:47          Patient, Medication, Dose, Route and Time verified prior to         administration.         IM medication, Amount given: 50mg , Medication administered to right          deltoid, Correct patient, time, route, dose and medication confirmed         prior to administration, Patient advised of actions and side-effects         prior to administration, Allergies confirmed and medications reviewed         prior to administration.       ORDERS (21:53 TKT1)   Elita Boone IV Cath:  Ordered for: [name], MD, Ben         Status: Active.   IV Start kit:  Ordered for: [name], MD, Ben         Status: Active.       NURSING ASSESSMENT: ALLERGIC REACTION (21:35 TKT1)   CONSTITUTIONAL: History obtained from patient, Patient arrives,         via [facility], Gait steady, Patient appears         comfortable, Patient cooperative, Patient alert, Oriented to person,         place and time.   RESPIRATORY: Respirations regular, Conversing normally, Breath         sounds clear, to bilateral upper lobes, to bilateral lower lobes.   SKIN: Inspection findings include rash, flesh colored, itchy,         with pustules, to face, ears, chest, back, and arms.  NURSING PROCEDURE: DISCHARGE NOTE (21:52 TKT1)   DISCHARGE: Patient discharged to home, ambulating without         assistance, driving self, unaccompanied, Discharge instructions given         to patient, Prescriptions given and instructions on side effects         given, Name of prescription(s) given: HCTZ.   IV FOLLOW-UP: IV discontinued, as ordered, due to patient being         discharged, catheter intact.   BELONGINGS: Belongings and valuables with patient at time of         discharge include:, Belongings remain with patient, Valuables remain         with patient.       NURSING PROCEDURE: IV (21:18 JEA1)   PATIENT IDENITIFIER: Patient's identity verified by patient         stating name, Patient's identity verified by patient stating birth         date, Patient's identity verified by hospital ID bracelet.   IV SITE 1: IV established, to the right antecubital, using a 20          gauge catheter, in one attempt, Saline lock established, Flushed with         normal saline (mls): 8, Labs drawn at time of placement, labeled in         the presence of the patient and sent to lab, Tourniquet removed from         patient after procedure.   FOLLOW-UP SITE 1: After procedure, sterile transparent dressing         applied, After procedure, IV secured by, After procedure, IV line         connections checked and properly labeled, After procedure, no         drainage at IV site, After procedure, no swelling at IV site, After         procedure, no redness at IV site.   NOTES: Patient tolerated procedure well.   SAFETY: Side rails up, Cart/Stretcher in lowest position, Call         light within reach, Hospital ID band on.       DIAGNOSIS (21:31 BAF)   FINAL: PRIMARY: acute dermatitis, ADDITIONAL: Tinea cruris.       DISPOSITION   PATIENT:  Disposition Type: Discharged, Disposition: Discharge,         Condition: Stable. (21:31 BAF)      Patient left the department. (22:15 TKT1)       VITAL SIGNS (21:23 TNB2)   VITAL SIGNS: BP: 173/70 (Lying), Pulse: 59, Resp: 16, Temp: 97.7         (Oral), Pain: denies, O2 sat: 99 on Room air, Time: 06/27/2011 21:23.       INSTRUCTION (21:31 BAF)   DISCHARGE:  RASH, NONSPECIFIC - WITH DIPHENHYDRAMINE / BENADRYL         (VIRAL EXANTHEM), TINEA CRURIS (JOCK ITCH, FUNGAL INFECTION).   FOLLOWUP:  [facility] [place], , MEDICINE.   SPECIAL:  Follow up with primary care physician.       PRESCRIPTION (21:32 BAF)   Hydrochlorothiazide:  Tablet : 25 mg : Oral : Quantity: *** 1 ***         Unit: tab Route: Oral Schedule: once a day Dispense: *** 30 ***.   NOTES:  No refills          May use generic.   Key:     BAF=Fickenscher, MD, Ben  JEA1=Alindogan, ACT III, Barbara Cower  MAA2=Alcos, RN,     MSN, Mariana Kaufman     TKT1=Thomas, Tanyell  TNB2=Branch, ACT III, Myer Peer

## 2011-09-18 LAB — DRUG SCREEN, URINE
AMPHETAMINES: NEGATIVE
BARBITURATES: NEGATIVE
BENZODIAZEPINES: NEGATIVE
COCAINE: POSITIVE — AB
METHADONE: NEGATIVE
OPIATES: NEGATIVE
PCP(PHENCYCLIDINE): NEGATIVE
THC (TH-CANNABINOL): NEGATIVE

## 2011-09-18 LAB — METABOLIC PANEL, COMPREHENSIVE
A-G Ratio: 0.9 (ref 0.8–1.7)
ALT (SGPT): 30 U/L (ref 12.0–78.0)
AST (SGOT): 31 U/L (ref 15–37)
Albumin: 4 g/dL (ref 3.4–5.0)
Alk. phosphatase: 108 U/L (ref 50–136)
Anion gap: 11 mmol/L (ref 3.0–18)
BUN/Creatinine ratio: 13 (ref 12–20)
BUN: 10 MG/DL (ref 7.0–18)
Bilirubin, total: 0.4 MG/DL (ref 0.2–1.0)
CO2: 23 MMOL/L (ref 21–32)
Calcium: 8.9 MG/DL (ref 8.5–10.1)
Chloride: 113 MMOL/L — ABNORMAL HIGH (ref 100–108)
Creatinine: 0.8 MG/DL (ref 0.6–1.3)
GFR est AA: >60 mL/min/{1.73_m2} (ref >60)
GFR est non-AA: >60 mL/min/{1.73_m2} (ref >60)
Globulin: 4.3 g/dL — ABNORMAL HIGH (ref 2.0–4.0)
Glucose: 76 MG/DL (ref 74–99)
Potassium: 4.5 MMOL/L (ref 3.5–5.5)
Protein, total: 8.3 g/dL — ABNORMAL HIGH (ref 6.4–8.2)
Sodium: 147 MMOL/L — ABNORMAL HIGH (ref 136–145)

## 2011-09-18 LAB — URINALYSIS W/ RFLX MICROSCOPIC
Bilirubin: NEGATIVE
Blood: NEGATIVE
Glucose: NEGATIVE MG/DL
Ketone: NEGATIVE MG/DL
Leukocyte Esterase: NEGATIVE
Nitrites: NEGATIVE
Protein: NEGATIVE MG/DL
Specific gravity: <1.005 (ref 1.003–1.030)
Urobilinogen: 0.2 EU/DL (ref 0.2–1.0)
pH (UA): 5.5 (ref 5.0–8.0)

## 2011-09-18 LAB — CBC WITH AUTOMATED DIFF
ABS. BASOPHILS: 0 10*3/uL (ref 0.0–0.1)
ABS. EOSINOPHILS: 0.2 10*3/uL (ref 0.0–0.4)
ABS. LYMPHOCYTES: 2.6 10*3/uL (ref 0.9–3.6)
ABS. MONOCYTES: 0.4 10*3/uL (ref 0.05–1.2)
ABS. NEUTROPHILS: 3 10*3/uL (ref 1.8–8.0)
BASOPHILS: 0 % (ref 0–2)
EOSINOPHILS: 3 % (ref 0–5)
HCT: 34.4 % — ABNORMAL LOW (ref 35.0–45.0)
HGB: 12.1 g/dL (ref 12.0–16.0)
LYMPHOCYTES: 42 % (ref 21–52)
MCH: 30.7 PG (ref 24.0–34.0)
MCHC: 35.2 g/dL (ref 31.0–37.0)
MCV: 87.3 FL (ref 74.0–97.0)
MONOCYTES: 6 % (ref 3–10)
MPV: 10.3 FL (ref 9.2–11.8)
NEUTROPHILS: 49 % (ref 40–73)
PLATELET: 242 10*3/uL (ref 135–420)
RBC: 3.94 M/uL — ABNORMAL LOW (ref 4.20–5.30)
RDW: 13.5 % (ref 11.6–14.5)
WBC: 6.1 10*3/uL (ref 4.6–13.2)

## 2011-09-18 LAB — ETHYL ALCOHOL: ALCOHOL(ETHYL),SERUM: 213 MG/DL — ABNORMAL HIGH (ref 0–3)

## 2011-09-18 MED ADMIN — 0.9% sodium chloride 1,000 mL with mvi, adult no.4 with vit K 10 mL, thiamine 100 mg, folic acid 1 mg infusion: INTRAVENOUS | @ 20:00:00 | NDC 99999323202

## 2011-09-18 MED ADMIN — LORazepam (ATIVAN) injection 0.5 mg: INTRAVENOUS | @ 19:00:00 | NDC 10019010239

## 2011-09-18 MED ADMIN — sodium chloride 0.9 % bolus infusion 1,000 mL: INTRAVENOUS | @ 19:00:00 | NDC 00409798309

## 2011-09-18 MED FILL — LORAZEPAM 2 MG/ML IJ SOLN: 2 mg/mL | INTRAMUSCULAR | Qty: 1

## 2011-09-18 MED FILL — SODIUM CHLORIDE 0.9 % IV: INTRAVENOUS | Qty: 1000

## 2011-09-18 NOTE — ED Provider Notes (Signed)
I was personally available for consultation in the emergency department.  I have reviewed the chart prior to the patient being discharged and agree with the documentation recorded by the MLP, including the assessment, treatment plan, and disposition.  DANIELI Alekhya Gravlin, MD

## 2011-09-18 NOTE — ED Notes (Signed)
Bedside shift change report given to [place] RN (oncoming nurse) by Cheral Marker RN (offgoing nurse).  Report given with SBAR, ED Summary and MAR.

## 2011-09-18 NOTE — ED Notes (Signed)
Pt continues to rest. No new orders received. Will continue to monitor.

## 2011-09-18 NOTE — ED Notes (Addendum)
Pt sleeping. Respirations easy non-labored. Ativan presently held

## 2011-09-18 NOTE — ED Notes (Signed)
PA to bedside to speak with pt. Pt with increased agitation and agressiveness. Pt medicated as ordered.

## 2011-09-18 NOTE — ED Notes (Signed)
Pt able to void on own. Pt states shoulder and neck pain have been going on "for months"

## 2011-09-18 NOTE — ED Notes (Signed)
Healthy choice meal given along with gingerale.

## 2011-09-18 NOTE — ED Notes (Signed)
Received c/o pt. Lying in bed with eyes closed, arouses easily, but groggy with verbal stimulus. NAD observed.

## 2011-09-18 NOTE — ED Notes (Signed)
Pt sleeping. Respirations easy non-labored. VSS. Will continue to monitor.

## 2011-09-18 NOTE — ED Notes (Signed)
Pt log rolled off of backboard by Barbara Cower C PA. Pt with complaints of pain to shoulders. C-collar remains on. Pt encouraged to lay flat until further evaluation.

## 2011-09-18 NOTE — ED Notes (Signed)
Received shift change report from Evelyn, RN

## 2011-09-18 NOTE — ED Notes (Signed)
Pt with increased agitation and restless. Pt medicated as ordered.

## 2011-09-18 NOTE — ED Provider Notes (Addendum)
HPI Comments: Wanda Doyle is a  56 y.o. Normal build african [place] female who smells of ETOH h/o Seizures, HTN presents to the ED via EMS on backboard and c-collar after being found laying outside of a hotel room door on the cement. This was an unwitnessed fall. Patient relates pain to palpation of the neck.  She says the pain over her shoulder blades has been hurting for months.       Patient is a 56 y.o. female presenting with fall. The history is provided by the patient.   Fall         Past Medical History   Diagnosis Date   ??? Seizures    ??? Hypertension         History reviewed. No pertinent past surgical history.      History reviewed. No pertinent family history.     History     Social History   ??? Marital Status: SINGLE     Spouse Name: N/A     Number of Children: N/A   ??? Years of Education: N/A     Occupational History   ??? Not on file.     Social History Main Topics   ??? Smoking status: Not on file   ??? Smokeless tobacco: Not on file   ??? Alcohol Use:    ??? Drug Use:    ??? Sexually Active:      Other Topics Concern   ??? Not on file     Social History Narrative   ??? No narrative on file                  ALLERGIES: Penicillins      Review of Systems   Unable to perform ROS: Other       Filed Vitals:    09/18/11 1616 09/18/11 1617 09/18/11 1618 09/18/11 1715   BP:   96/55 112/67   Pulse: 67 69 71 72   Temp:       Resp: 14 13 16 15    SpO2: 100% 99% 100% 100%            Physical Exam   Nursing note and vitals reviewed.  Constitutional: She is oriented to person, place, and time. She appears well-developed and well-nourished. No distress.   HENT:   Head: Normocephalic and atraumatic.   Right Ear: Hearing, tympanic membrane, external ear and ear canal normal.   Left Ear: Hearing, tympanic membrane, external ear and ear canal normal.   Nose: Nose normal. No mucosal edema or rhinorrhea.   Mouth/Throat: Uvula is midline, oropharynx is clear and moist and mucous membranes are normal. No uvula swelling. No oropharyngeal  exudate, posterior oropharyngeal edema, posterior oropharyngeal erythema or tonsillar abscesses.   Eyes: Conjunctivae and EOM are normal. Pupils are equal, round, and reactive to light. Right eye exhibits no discharge. Left eye exhibits no discharge.   Neck: Trachea normal, normal range of motion, full passive range of motion without pain and phonation normal. Neck supple. No tracheal tenderness, no spinous process tenderness and no muscular tenderness present. No rigidity. No tracheal deviation, no edema, no erythema and normal range of motion present. No mass and no thyromegaly present.   Cardiovascular: Normal rate, regular rhythm and normal heart sounds.  Exam reveals no gallop and no friction rub.    No murmur heard.  Pulmonary/Chest: Effort normal and breath sounds normal. No accessory muscle usage or stridor. Not tachypneic. No respiratory distress. She has no decreased breath sounds. She  has no wheezes. She has no rhonchi. She has no rales.   Abdominal: Soft. Bowel sounds are normal. She exhibits no mass. There is no tenderness. There is no rigidity, no rebound, no guarding, no CVA tenderness, no tenderness at McBurney's point and negative Murphy's sign.   Musculoskeletal:        Right shoulder: Normal.        Left shoulder: Normal.        Cervical back: She exhibits pain and spasm. She exhibits normal range of motion, no tenderness, no bony tenderness, no swelling, no edema, no deformity and normal pulse.        Thoracic back: She exhibits pain and spasm. She exhibits normal range of motion, no tenderness, no bony tenderness, no swelling, no edema, no deformity, no laceration and normal pulse.        Lumbar back: Normal.        Cervical Spine: + Paracervical muscle TTP. No prevertebral swelling. No midline TTP.     Thoracic Spine: + Parathoracic muscle TTP. No midline TTP. Normal inspection.     L-S: Normal inspection. NTTP. No midline TTP.    Lymphadenopathy:     She has no cervical adenopathy.    Neurological: She is alert and oriented to person, place, and time. She has normal strength and normal reflexes. No sensory deficit.   Skin: Skin is warm and dry. She is not diaphoretic.   Psychiatric: She has a normal mood and affect. Her behavior is normal.        MDM     Differential Diagnosis; Clinical Impression; Plan:     DDX: Cervical Sprain/Strain, Fracture (Atlantoaxial Injury and Dysfunction), Brachial Plexus Injury, Cervical Disc Injury, Cervical Discogenic Pain Syndrome, Cervical Facet Syndrome, Cervical Radiculopathy, Myofascial Pain.    DDX: Sciatica, Lumbar radiculopathy, Back Sprain, Back Strain, DJD, HNP, Epidural abscess, Cauda Equina Syndrome, Contusion, Pyelonephritis, Renal Colic, Rib Fracture, Rib Contusion, Costochondritis, Pleurisy, Pleurodynia, Pneumothorax, Thoracic Aneurysm, Abdominal Aortic Aneurysm, Nephro-ureteral Calculus, Acute Myocardial Infarction.       XR CHEST PORT (Final result)   Result time:09/18/11 1459     Final result by Rad Results In Edi (09/18/11 14:59:41)     Impression:     IMPRESSION:    No evidence for active cardiopulmonary disease.       Narrative:     Chest One View    CPT CODE: 36644    HISTORY: Unwitnessed fall    COMPARISON: No priors.    FINDINGS: AP upright semierect chest. No pneumothorax is demonstrated. Linear  band of opacity left lung base compatible discoid atelectasis or fibrosis.  Asymmetrical smooth right apical pleural thickening is present consistent with  postinflammatory scar.    . Left basilar discoid atelectasis. The heart is  normal in size. The mediastinum appears normal. The lateral sulci are sharp.  Bone structures appear unremarkable for age.    .                   CT SPINE CERV WO CONT (Final result)   Result time:09/18/11 1439     Final result by Rad Results In Edi (09/18/11 14:39:57)     Impression:     IMPRESSION:    Negative for acute sequelae of trauma.     Narrative:     CT Cervical Spine    CPT CODE: 03474    AXIAL VIEWS OF THE  CERVICAL SPINE ARE REVIEWED.  IN ORDER TO ASSESS THE  ALIGNMENT AND DISC  HEIGHT RECONSTRUCTIONS WERE CREATED IN BOTH THE SAGITTAL AND  CORONAL PLANES    INDICATION: Trauma.    COMPARISON: No priors.    FINDINGS:    Images through the pulmonary apices show some smooth apical capping on the  right. There are some paraseptal and centrilobular emphysema..  There is no adenopathy or mass evident.  The thyroid is unremarkable.  The cervical alignment is satisfactory.  There is multilevel disc space narrowing.  There are some mild sclerosis of the facet joints.  No fracture or subluxation is evident  .  ..                   CT HEAD WO CONT (Final result)   Result time:09/18/11 1435     Final result by Rad Results In Edi (09/18/11 14:35:19)     Impression:     IMPRESSION:    Negative study    .  Marland Kitchen       Narrative:     CT OF THE BRAIN    CPT CODE: 14782    COMPARISON: None.    INDICATIONS: Fall.    TECHNIQUE: Axial sections through the brain at 5 mm collimation without IV  contrast are obtained.    FINDINGS:    The ventricles are normal in size and midline in position..  The brain parenchyma is of normal attenuation.. There is no hemorrhage.  No pathologic fluid collections are evident..  No mass lesions are identified..  No intra-or extra-axial fluid collections are seen..  The calvarium is intact.    The visible portions of the paranasal sinuses are clear.     5:29 PM  ETOH: 213   UDS: + Cocaine    CBC, CMP are largely unremarkable. UA is negative.     2 LNS have been given. Patient has received a total of Ativan 1 mg IV for sedation as she has become periodically agitated.     Discussed with PA Spenser Harren concerning patient Wanda Doyle, standard discussion of reason for visit, HPI, ROS, PE, and current results available. She agrees to assume the care of this patient at this time as the previous provider's shift has come to a close.     Michaelyn Barter, PA-C  September 18, 2011  6:00 PM     PROGRESS NOTES:     6:10 PM   Assumed  care of patient at the beginning of my shift from preceding provider Bronson Curb PA-C.   I introduced myself, explained that I was the provider who would be followed the remaining emergency department course, reaffirmed the history and reexamined the patient.   Resting at time of assuming care.   Avamarie Crossley, PA-C     9:09 PM   Pt was sleeping until now. She is awake and appears sober. Will ambulate. PLAN: d/c unless EtOH is still high.  Rozanna Box, PA-C       Amount and/or Complexity of Data Reviewed:   Clinical lab tests:  Ordered and reviewed  Tests in the radiology section of CPT??:  Ordered and reviewed  Tests in the medicine section of the CPT??:  Ordered and reviewed  Discussion of test results with the performing providers:  Yes   Decide to obtain previous medical records or to obtain history from someone other than the patient:  Yes   Obtain history from someone other than the patient:  No   Review and summarize past medical records:  Yes   Discuss the  patient with another provider:  Yes   Independant visualization of image, tracing, or specimen:  Yes  Risk of Significant Complications, Morbidity, and/or Mortality:   Presenting problems:  Moderate  Diagnostic procedures:  Moderate  Management options:  Moderate      Procedures    Recent Results (from the past 12 hour(s))   ETHYL ALCOHOL    Collection Time    09/18/11  2:20 PM       Component Value Range    ALCOHOL(ETHYL),SERUM 213 (*) 0 - 3 MG/DL   DRUG SCREEN, URINE    Collection Time    09/18/11  2:20 PM       Component Value Range    BENZODIAZEPINE NEGATIVE   NEGATIVE    BARBITURATES NEGATIVE   NEGATIVE    THC (TH-CANNABINOL) NEGATIVE   NEGATIVE    OPIATES NEGATIVE   NEGATIVE    PCP(PHENCYCLIDINE) NEGATIVE   NEGATIVE    COCAINE POSITIVE (*) NEGATIVE    AMPHETAMINE NEGATIVE   NEGATIVE    METHADONE NEGATIVE   NEGATIVE    HDSCOM (NOTE)     URINALYSIS W/ RFLX MICROSCOPIC    Collection Time    09/18/11  2:20 PM       Component Value Range    Color YELLOW       Appearance CLEAR      Specific gravity <1.005  1.003 - 1.030      pH 5.5  5.0 - 8.0      Protein NEGATIVE   NEGATIVE MG/DL    Glucose NEGATIVE   NEGATIVE MG/DL    Ketone NEGATIVE   NEGATIVE MG/DL    Bilirubin NEGATIVE   NEGATIVE    Blood NEGATIVE   NEGATIVE    Urobilinogen 0.2  0.2 - 1.0 EU/DL    Nitrites NEGATIVE   NEGATIVE    Leukocyte Esterase NEGATIVE   NEGATIVE   CBC WITH AUTOMATED DIFF    Collection Time    09/18/11  2:20 PM       Component Value Range    WBC 6.1  4.6 - 13.2 K/uL    RBC 3.94 (*) 4.20 - 5.30 M/uL    HGB 12.1  12.0 - 16.0 g/dL    HCT 11.9 (*) 14.7 - 45.0 %    MCV 87.3  74.0 - 97.0 FL    MCH 30.7  24.0 - 34.0 PG    MCHC 35.2  31.0 - 37.0 g/dL    RDW 82.9  56.2 - 13.0 %    PLATELET 242  135 - 420 K/uL    MPV 10.3  9.2 - 11.8 FL    NEUTROPHILS 49  40 - 73 %    LYMPHOCYTES 42  21 - 52 %    MONOCYTES 6  3 - 10 %    EOSINOPHILS 3  0 - 5 %    BASOPHILS 0  0 - 2 %    ABS. NEUTROPHILS 3.0  1.8 - 8.0 K/UL    ABS. LYMPHOCYTES 2.6  0.9 - 3.6 K/UL    ABS. MONOCYTES 0.4  0.05 - 1.2 K/UL    ABS. EOSINOPHILS 0.2  0.0 - 0.4 K/UL    ABS. BASOPHILS 0.0  0.0 - 0.1 K/UL    DF AUTOMATED     METABOLIC PANEL, COMPREHENSIVE    Collection Time    09/18/11  2:20 PM       Component Value Range    Sodium 147 (*) 136 - 145 MMOL/L  Potassium 4.5  3.5 - 5.5 MMOL/L    Chloride 113 (*) 100 - 108 MMOL/L    CO2 23  21 - 32 MMOL/L    Anion gap 11  3.0 - 18 mmol/L    Glucose 76  74 - 99 MG/DL    BUN 10  7.0 - 18 MG/DL    Creatinine 2.95  0.6 - 1.3 MG/DL    BUN/Creatinine ratio 13  12 - 20      GFR est AA >60  >60 ml/min/1.68m2    GFR est non-AA >60  >60 ml/min/1.85m2    Calcium 8.9  8.5 - 10.1 MG/DL    Bilirubin, total 0.4  0.2 - 1.0 MG/DL    ALT 30  62.1 - 30.8 U/L    AST 31  15 - 37 U/L    Alk. phosphatase 108  50 - 136 U/L    Protein, total 8.3 (*) 6.4 - 8.2 g/dL    Albumin 4.0  3.4 - 5.0 g/dL    Globulin 4.3 (*) 2.0 - 4.0 g/dL    A-G Ratio 0.9  0.8 - 1.7

## 2011-09-18 NOTE — ED Notes (Addendum)
Pt with increased agitation when asked to lay flat for BP then falls back asleep. Pt awakens easily to verbal stimuli. Will continue to monitor.

## 2011-09-18 NOTE — ED Notes (Signed)
I have reviewed discharge instructions with the patient.  The patient verbalized understanding. Patient armband removed and shredded

## 2011-09-18 NOTE — ED Notes (Addendum)
Pt laying outside of hotel room door on cement on EMS arrival Un witnessed if pt fell. Pt very inconsistent with story. Pt with pain to palpation by EMS to neck. Pt originally admitted to drinking wine today. Pt moving all extremities purposefully. Pt told EMS hands and legs went numb, tried to call for help and unable to so crawled out of door and laid flat on ground

## 2011-09-18 NOTE — ED Notes (Signed)
Pt voided approx 400 cc of urine. Pt able to use bedpan without difficulty.

## 2011-09-19 LAB — ETHYL ALCOHOL: ALCOHOL(ETHYL),SERUM: 84 MG/DL — ABNORMAL HIGH (ref 0–3)

## 2012-02-14 NOTE — Progress Notes (Signed)
ST DAILY TREATMENT NOTE    Patient Name: Wanda Doyle  Date:02/14/2012  DOB: April 11, 1955  [x]   Patient DOB Verified  [place]or: [place] MED[place]  Plan: [place] MEDICARE PART A & B  Product Type: Medicare    In time:2:05  Out time:3:35  Total Treatment Time (min): 150  Total Timed Codes (min): NA  1:1 Treatment Time (MC only): 150  Visit #: 1 of 24    SUBJECTIVE  Pain Level (0-10 scale):0  Any medication changes, allergies to medications, adverse drug reactions, diagnosis change, or new procedure performed?: [x]  No    []  Yes (see summary sheet for update)  Subjective functional status/changes:   []  No changes reported  I can remember things but when I start talking...she could not  communicate thee rest secondary to her aphasia.   Date of Onset: 11/21/11  Social History: widow; no children, currently unemployed. 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 She lives alone in a motel. She has friends and family in the area who check on her. She completed 10th grade with with some difficulty with reading and writing (but hard to understand this). She also communicated but said also that she had above average grades but this SLP could not be sure due to her aphasia.  She has a step-father who lives in [place], She has 3 brothers living and 3 deceased.   Prior Functional level: speech, language skills were normal. No dysarthria, verbal apraxia, nor articulation difficulty. She said that her memory was good.   Radiology:from MD notes: MRI and CT scans done at [facility] while she was hospitalized 11/12/-11/23/12 confirm Left lateral CVA.  OBJECTIVE    OUTPATIENT SPEECH-LANGUAGE EVALUATION    Receptive Language:  Receptive vocabulary    []  WNL    [x]  Impaired [x]  Mild [x]  Mod []  Severe  Reliability of yes/no responses to questions []  WNL    [x]  Impaired [x]  Mild [x]  Mod []  Severe   Reliability of responses to complex ideation []  WNL    []  Impaired [x]   Mild [x]  Mod []  Severe    Follow commands [x]  1 Step [x]  2 Step []  3 Step []  complex  Objective Information:Boston Diagnostic Aphasia Examination-3rd Edition (BDAE-3rd Ed). Short form administered:  Auditory Verbal Comprehension: Word Comprehension: Basic word discrimination: 15/16 chin/cheek. 60%ile ranking; commands: 40%ile ranking; She was able to follow 1, 2, 3 step commands with visual support with objects, partial completion of complex commands. Additional: She was partially correct for a 3 step command without visual support (auditory only). Complex Ideational Material (yes/no abstract questions, and answering yes/no questions from spoken paragraphs): 50%ile ranking (moderate deficits) breakdown beginning at the sentence level.    Overall: moderately impaired receptive language    Expressive Language  Automatic speech   [x]  WNL    []  Impaired []  Mild []  Mod []  Severe  Able to identify objects/pictures []  WNL    [x]  Impaired []  Mild [x]  Mod []  Severe  Preservations    []  WNL    []  Impaired [x]  Mild []  Mod []  Severe  Word retrieval    []  WNL    []  Impaired []  Mild []  Mod [x]  Severe   Paraphasias    [] None[]  Literal    [x]  Phonemic   [x]  Jargon   [x]  Semantic [x]  Neologisms   Able to make needs known at level []  Word   []  Phrase   []  Sentence   []   Gesture-she did gesture to augment her speech.       []  often unable verbally. verbal + gesture    Objective Information: Boston Diagnostic Aphasia Examination-3rd Edition (BDAE-3rd Ed.) short form was administered. Conversational speech: Her conversational speech was fluent  And anomic although the flow of her speech was frequently interrupted by word finding pauses or attempts to self-correct her errors. The sentences were of normal average phrase length and syntax. She spoke in sentences but had noticeable word retrieval difficulty and hesitations related to word retrieval. She exhibited many phonemic paraphasias which she would often recognize and with repeated  attempts to correct them. She often exhibited repeated attempts to come closer and closer to the target word in a conduit d'approach or further away in her attempts. She exhibited frequent circumlocution to compensate for the failure to achieve the correct word and often the circumlocutions were not much better. In her conversational sample only approximately 5% of her message was communicated without visual support (gestures) which increased to about 20-25% to convey her message. Picture description of "The Cookie Thief": Phonemic paraphasias in every sentence and in most partial sentences. She attempted self correction which were successful 50% of the time of her attempt. Approximately 30% they were not attempted. The content was relatively cummunicated correctly about 65%. She exhibited some minor syntax problems and there were a few semantic paraphasias.  Automatic sequences: 100%ile ranking; repetition: words; 30%ile ranking; breakdown at the multi-syllabic level. (severe deficits) Repetition of sentences: 30%ile ranking (severe deficits).  Responsive Naming; 30%ile ranking (severe deficits) She was able to gesture 3/5 of her responses correctly. Visual confrontation naming: short form of Boston Naming Test (BNT) :  3/15 spontaneously named correctly; no improvement with semantic cue. 12 phonemic cues given with no further successful naming. 12 multiple choice cues given after semantic and phonemic cues failed. 9 successful pointing to the target written word from a choice of 4 words. She often could not say the word from visual stimulus.  20%ile ranking (profound deficits). Conduit approach to word retrieval -trying to hone in on the word, closer to target but at times she would get farther away-often unsuccessful.    Aphia severity rating profile: rating of 2 -2.5 on a 5 point scale with 0 profound  and 5 minimal handicap. Most communication is fragmentary or with great need for inference questioning, and  guessing by the listener and the listener and patient carry the burden of communication. There are frequent failures to convey the idea.   Writing: []  L or []  R []  WNL    []  Impaired @ []  Word []  Sentence []  Paragraph  To be assessed.   Reading:  []  WNL    []  Impaired @ []  Word []  Sentence []  Paragraph  Wears reading glasses, has them today  She was able to recognize words to pictures in BNT and with medical hx form but not formally assessed due to time constraints. To be assessed.   Cognition:  Attention: [x]  Alert    []  Drowsy  Orientation: [x]  Person   [x]  Place    [x]  Time  Memory: []  WNL    []  Impaired ST   []  Impaired LT  Comments: Cognition appears to be relatively intact but unable to assess secondary to the severity of her aphasia.    Executive Functions:  Problem Solving: []  WNL     []  Impaired []  Mild []  Mod []  Severe to be determined. Unable to fully assess due to the severity  of her aphasia.  Neglect:  [x]  None    []  Left   []  Right  Self-regulation  [x]  Appropriate   []  Impulsive   []  Requires verbal cues  Awareness:  [x]  aware of her deficits. Poor initiation   []  Disinhibition   []  Constant supervision        Speech:  Oral Verbal Apraxia: [x]  None    []  Mild    []  Moderate    []  Severe   Dysarthria:  [x]  None    []  Mild    []  Moderate    []  Severe   Intelligibility:  []  WNL    []  Words   []  Phrases   []  Sentences   []  Conversation      % Intelligible:  Voice:   [x]  WNL    []  Hoarse   []  Breathy   Comments:  Fluency:  [x]  WNL    []  Dysfluent:    Patient Education: []  Review HEP    []  Progressed/Changed HEP based on:_  HEP/Handouts given:_none  Patient/Caregiver instruction/education:_testing instructions, results of assessment, short and long term goals were discussed.(minutes: 125 )      Pain Level (0-10 scale) post treatment: 0    ASSESSMENT  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   [x]   See Plan of Care    PLAN  []   Upgrade activities as tolerated     []   Continue plan of care  []   Discharge due  to:__  [x]  Other: Initiate speech therapy for treatment of aphasia__    Ronnell Gu[place], SLP 02/14/2012  2:12 PM

## 2012-02-14 NOTE — Progress Notes (Signed)
[facility] ??? [facility]  [address]  [place], [place] [address]  [phone]  [phone] fax    Plan of Care/ Statement of Necessity for Speech Therapy Services    Patient name: Wanda Doyle      Start of Care: 02/14/2012   Referral source: Ernst Spell, MD    DOB: 1955-07-24   Diagnosis: CVA (cerebral infarction) [434.91]   Onset Date: 11/21/11    Prior Hospitalization:11/12-11/15/13    Provider#: [id number]  Comorbidities: htn, asthma, arthritis, osteoporosis, epilepsy, ETHOH use,, cocaine use, tobacco use   Prior Level of Function: Normal speech, language and memory problems. Not driving, doing own bill pay with checking    The Plan of Care and following information is based on the information from the initial evaluation.  Assessment/ key information: Patient is a 57 year old RHD female s/p left lateral cerebrum CVA. She lives alone. She went to the dentist and apparently as this SLP could make out from her aphasic communication that she was sent from the office to ED. MD notes report that she was hospitalized at [facility] from 11/12-11/15/13. She presented with slurred speech and right facial and arm weakness. MD notes stated that [facility] [place] [facility] was scheduled but there were no further entries regarding follow up on that. Patient mentioned something about someone coming to her home but with the extent of her aphasia and decreased communication performance the message was not conveyed. She tried to give her history but it was very sketchy due to her aphasia. The above information from her MD was obtained after her visit as his office was closed by that time.      An aphasia evaluation was conducted with the following information. The short form of Boston Diagnostic Aphasia Examination-3rd Edition (BDAE-3rd Ed.) was administered. 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 Her auditory comprehension skills are moderately impaired. Her verbal skills  are severely impaired. She exhibited fluent anomic speech although the flow of her speech was frequently interrupted by word finding pauses or attempts to self-correct her very frequent phonemic paraphasic errors. She did exhibit semantic paraphasias as well and there were some neologisms and phonemic jargon occasionally.  The sentences were of normal average phrase length and mostly correct syntax and morphology.  She exhibited many phonemic paraphasias which she would often recognize and with repeated attempts to correct them. She often exhibited repeated attempts to come closer and closer to the target word in a conduit d'approach or further away in her attempts. She exhibited frequent circumlocution to compensate for the failure to achieve the correct word and often the circumlocutions were not much better. In her conversational sample only approximately 5% of her message was communicated without visual support (gestures) which increased to about 20-25% to convey her message. Picture description of "The Cookie Thief" as better but there were phonemic paraphasias in every sentence and in most partial sentences. She attempted self correction which were successful 50% of the time of her attempt. In about 30% they were not attempted. The content was relatively communicated correctly about 65%. She exhibited some minor syntax problems and there were a few semantic paraphasias.     Repetition of words, sentences, responsive naming and visual confrontation naming were severely impaired, with 30%ile ranking for each of these categories. She used the same honing in attempts to self correct and mostly unsuccessful. There were some perseverations noted in naming.  Automatic speech for counting, days/wk, mo/year were  all correct.   Gestures: Patient was able to augment her speech with gestures, observed in conversation and in responsive naming when she realized that there was a communication breakdown. She is not using  decreased decreased circumlocution once her message is successfully conveyed.    Aphia severity rating profile: rating of 2 -2.5 on a 5 point scale with 0 profound and 5 minimal handicap. Most communication is fragmentary or with great need for inference questioning, and guessing by the listener and the listener and patient carry the burden of communication. She is able to communicate some information for familiar subjects with assistance from the listener.There are frequent failures to convey the idea.     While reading was not formally assessed due to time constraints, she was able to match up the written word with the pictured object on BNT. Writing was not assessed due to time constraints and will be assessed. Her cognitive skills were not assessed due to the severity of her aphasia but they seem to be relatively intact (first impression, this could change).    It is recommended that Wanda Doyle receive skilled speech therapy services to address the above mentioned deficits as it is medically necessary.     Problem List:    [x] aphasic  [] dysarthric  [] dysphagic       [] alexic  [] agraphic  [] dysphonia       [] dysfluency   [] Cognitive-Linguistic Disorder       [] other   Treatment Plan may include any combination of the following: Aphasia Treatment      Patient / Family readiness to learn indicated by: asking questions, trying to perform skills and interest    Persons(s) to be included in education:   patient (P)    Barriers to Learning/Limitations: yes;  Speech: nature of communication with conduction aphasia (per naming research: Dr. Alanson Puls Raymer at [facility]: often unable to fully correct many paraphasias. Patient will need to learn stop honing in so much and with decreased circumlocution once her message is conveyed. Her communication partner will need to signal the attempt was successful and continue with communication.    Patient Goal (s): ???I want to.Marland KitchenMarland Kitchen??? She stopped, unable to find the rest  of the words. When SLP said talk better she said yes.    Rehabilitation Potential: good    Short Term Goals: To be accomplished in 16-18 visits   1. Patient will demonstrate increased auditory comprehension skills at answering simple to beginning moderately complex yes/no questions presented verbally by clinician with 80% accuracy with one repeat needed or min verbal cues.   2. Patient will follow  simple to beginning moderately 3 step commands with and without visual support demonstrate increased auditory comprehension skills at word level by pointing to objects named by clinician out of field of 2, 80% accuracy with min verbal cues.   3. Patient will demonstrate increased  auditory comprehension by answering yes/no and some WH questions for simple to beginning moderately complex short spoken paragraphs presented at 3/4 speed to normal speed with 70% accuracy with min cues.    4. Patient will demonstrate increased expressive language skills by naming basic objects, 60% accuracy, accepting errors that are very close to target word 50% of the time (see barriers to learning) with min-mod assist.  5. Patient will demonstrate increased expressive language skills by performing simple sentence completion tasks with 60% accuracy with min assist.  6. Patient will answer simple questions and perform responsive naming tasks and simple picture description  task  with 60% accuracy with min-mod assist, accepting accepting errors that are very close to target word (s) 30% of the time (see barriers to learning) with min-mod assist.  7. Patient will demonstrate improved ability to use compensatory communication strategies related to recognizing when her message was successful and decreased circumlocution once her message is conveyed, utilizing more gestures, asking for repeats with mod assist.   7. Patient will participate in additional speech/language assessments, including reading and writing within 6 treatment sessions.        Long Term Goals: To be accomplished in 36 visits   1. Patient will demonstrate increased auditory comprehension skills at moderately complex sentence level to short  paragraph level with  75% accuracy with min assist.   3. Patient will demonstrate increased expressive language skills to communicate her wants, needs, and thoughts for functional communication related to self, home and medical and basic community with 60% accuracy with min-mod assist and with alternative/augmentative communication device or gestures if needed.     Frequency / Duration: Patient to be seen 2-3 times per week for  16-24 treatments:    G Codes:  Expression: G9162 Current  CL= 60-79%  G9163 Goals  CK= 40-59%  The severity rating is based on the following outcomes:    National Outcomes Measures (NOMS)    Patient/ Caregiver education and instruction:results of assessment, short and long term goals were discussed     Certification Period: 02/14/2012 to 05/14/2012    Ronnell Guadalajara, SLP 02/14/2012 2:13 PM  ________________________________________________________________________    I certify that the above Therapy Services are being furnished while the patient is under my care. I agree with the treatment plan and certify that this therapy is necessary.    Physician's Signature:____________________  Date:___________Time:_________    Please sign and return to [facility] ??? [facility]  [address]  [place], [place] [address]  [phone]  [phone] fax     Thank you

## 2012-02-26 NOTE — Progress Notes (Signed)
ST DAILY TREATMENT NOTE    Patient Name: Wanda Doyle  Date:02/26/2012  DOB: [birth year]  [x]   Patient DOB Verified  Payor: [place] MEDICARE  Plan: [place] MEDICARE PART A & B  Product Type: Medicare    In time: 2:55  Out time:3: 33  Total Treatment Time (min): 38  Total Timed Codes (min): NA  1:1 Treatment Time (MC only): 38  Visit #: 2 of 16-24    SUBJECTIVE  Pain Level (0-10 scale): 0  Any medication changes, allergies to medications, adverse drug reactions, diagnosis change, or new procedure performed?: [x]  No    []  Yes (see summary sheet for update)  Subjective functional status/changes:   []  No changes reported  The Dr. Rhina Brackett me to the service and that's how they found they sent me to one. He want to get a cleaning.  I had a new one (tooth) -when she fell. It lost it and I don't know where it got to. They been gone a long time-in [place].     OBJECTIVE  Treatment provided includes:_Aphasia_  Increase/Improve:  []   Voice Quality []   Eating/Swallowing Skills  []   Writing Skills   []   Vocal Loudness []   Cognitive Linguistic Skills []   Speech Intelligibility   []   Fluency []   Auditory Comprehension []   Oral Motor Skills   []   Reading Comprehension []   Expressive Language []   Safety Awareness   []   Breath Support/Coord. []   Articulation      Decrease:  []   Dysphagia []   Apraxia []   Dysphonia   []   Dysarthria []   Dysfluency []   Cognitive Ling. Deficit   [x]   Aphasia         Patient Education: []  Review HEP    []  Progressed/Changed HEP based on:_  HEP/Handouts given:_  Patient/Caregiver instruction/education:_  (minutes: )  Patient was able to:  Other Objective/Functional Measures: Reading assessment :  Portions of Boston Diagnostic Aphasia Examination-3rd edition were administered with the following percentile rankings:short form:  Basic symbol recognition: 100%ile ranking number matching: 100%ile ranking;   Word identification:  Picture-word matching: 40%ile missed binocular-pointed to telescope. (moderate-severe  deficits)Oral reading: basic oral word reading: 40%ile; (moderate-severe deficits); oral reading of sentences with comprehension: oral sentence reading: 60%ile; (moderate deficits)Comprehension; 100%ile; Reading comprehension-sentence and paragraphs: 40%ile (moderate-severe deficits).   Writing: mechanics of writing: well-formedness of letters: 10%ile ranking, correctness of letter choice: 60%ile ranking; motor facility: 50%ile ranking.  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 She seemed to be discussing her teeth as that may be what she was comprehending. Reading comprehension is moderately impaired at the word level and moderate-severely impaired at the sentence and paragraph level. Oral reading of basic words and sentences is moderately impaired with phonemic paraphasias and attempts to correct them. There were also errors in changing the word to a different word (decreased attention to details).  Content for writing related to mechanics showed minor errors. There was some difficulty with motoric facility and well-formedness of the letters. Her writing was legible though for the assessment conducted so far.   []   See Plan of Care  []   See progress note/recertification  []   Patient will continue to benefit from skilled therapy to address remaining functional deficits: severe conduction aphasia    Progress towards goals / Updated goals:_  Working towards  STG# 7  PLAN  []   Upgrade activities as tolerated     [x]   Continue plan of care  []   Discharge due to:__  []  Other:__    Ronnell Guadalajara, SLP 02/26/2012  2:52 PM

## 2012-03-01 NOTE — Progress Notes (Signed)
ST DAILY TREATMENT NOTE    Patient Name: Wanda Doyle  Date:03/01/2012  DOB: 1955/10/27  [x]   Patient DOB Verified  Payor: [place] MEDICARE  Plan: [place] MEDICARE PART A & B  Product Type: Medicare    In time: 11:17  Out time:12:02  Total Treatment Time (min): 45  Total Timed Codes (min): NA  1:1 Treatment Time (MC only): 45  Visit #: 3 of 16-24    SUBJECTIVE  Pain Level (0-10 scale): 0  Any medication changes, allergies to medications, adverse drug reactions, diagnosis change, or new procedure performed?: []  No    []  Yes (see summary sheet for update)  Subjective functional status/changes:   []  No changes reported  I have trouble (and she gestured to her throat).    OBJECTIVE  Treatment provided includes:_Aphasia_  Increase/Improve:  []   Voice Quality []   Eating/Swallowing Skills  []   Writing Skills   []   Vocal Loudness []   Cognitive Linguistic Skills []   Speech Intelligibility   []   Fluency []   Auditory Comprehension []   Oral Motor Skills   []   Reading Comprehension [x]   Expressive Language []   Safety Awareness   []   Breath Support/Coord. []   Articulation      Decrease:  []   Dysphagia []   Apraxia []   Dysphonia   []   Dysarthria []   Dysfluency []   Cognitive Ling. Deficit   [x]   Aphasia       Patient was able to:  Data           #correct  #trials  %  Cues/Comments   Teaching: sentence: repetition; I have trouble talking 1 0 1 1 1 1 1 1    7/8 87 With mod cues dec to S   Simple sentence completions 1 1 1 1 1  0 1 1 1 1  12/13 92     1 1 1              Visual confrontation naming of simple pictured objects            22: pica score: 13.4=     89 See data sheet with stimulus and scores in paper chart   Production of multi-syllabic words with speech strategies:                 Bi-syllabic words (from reading stimulus)           28/35 80 With min assist   Verbal expression: communicate her thoughts             She was able to tell about her appointment with a neurologist (bone/brain) coming up next Wed. She was able to tell the name  of where she lives (best [place] hotel) with min A. Also she was able to communicate that her ride was not answering re her request to pick her up (with S) and also that she was out of allotted minutes on her cell phone.                     Patient Education: [x]  Review HEP    []  Progressed/Changed HEP based on:_  HEP/Handouts given:_speech strategies written, bisyllabic and tri-syllabic words, 2 word phrases using speech strategies.  Patient/Caregiver instruction/education:_strategies to improve production (decrease paraphasias, increase planning). Plan what you want to say first, think of the first letter and first syllable, tap out each syllable starting with the first syllable. Focus on the first syllable so that you don't move sounds from the middle to the front.   (  minutes: 20)    Other Objective/Functional Measures:    Pain Level (0-10 scale) post treatment: 0    ASSESSMENT  She is able to communicate more of her thoughts with decreased aphasia.     []   See Plan of Care  []   See progress note/recertification  []   Patient will continue to benefit from skilled therapy to address remaining functional deficits: severe conduction aphasia      Progress towards goals / Updated goals:_  Progressing with STG's 4,5,6, 7 STG's # 1,2,3 not yet targeted.  PLAN  []   Upgrade activities as tolerated     [x]   Continue plan of care  []   Discharge due to:__  []  Other:__    Ronnell Guadalajara, SLP 03/01/2012  11:20 AM

## 2012-04-09 NOTE — Progress Notes (Signed)
ST DAILY TREATMENT NOTE    Patient Name: Wanda Doyle  Date:04/09/2012  DOB: 1956/01/05  [x]   Patient DOB Verified  Payor: VA MEDICARE  Plan: VA MEDICARE PART A & B  Product Type: Medicare    In time: 12:22 Out time: 1:10  Total Treatment Time (min): 43  Total Timed Codes (min): NA  1:1 Treatment Time (MC only): 43   Visit #: 4 of 16-18     SUBJECTIVE  Pain Level (0-10 scale): 7/10 (right leg)  Any medication changes, allergies to medications, adverse drug reactions, diagnosis change, or new procedure performed?: [x]  No    []  Yes (see summary sheet for update)  Subjective functional status/changes:   []  No changes reported  She said that the cab people are not showing to pick her up from therapy. She has 2 friends that can help her with the cab and with HEP.   OBJECTIVE  Treatment provided includes:_Aphasia  Increase/Improve:  []   Voice Quality []   Eating/Swallowing Skills  []   Writing Skills   []   Vocal Loudness []   Cognitive Linguistic Skills []   Speech Intelligibility   []   Fluency [x]   Auditory Comprehension []   Oral Motor Skills   []   Reading Comprehension [x]   Expressive Language []   Safety Awareness   []   Breath Support/Coord. []   Articulation      Decrease:  []   Dysphagia []   Apraxia []   Dysphonia   []   Dysarthria []   Dysfluency []   Cognitive Ling. Deficit   [x]   Aphasia       Patient was able to:  Data  Pica scoring: 15= immediate, accurate, 14= named but with apraxic searching and apraxic off target (close to) response,13= accurate delayed; 12=incomplete; 10= corrected;  8= correct with cue; 6 = error.           #correct  #trials  %  Cues/Comments   VNSAnswer yes/no questions: VNS vol 1. p 19 9 15 15 15 15 15 15 15 15 15  17/20 85     15 15 15 6 6 15 15 15 15 15       p20 15 15 15 15 15 6 15 15 15  15 18/20 90     15 15 15 15 6 15 15 15 15 15       Simple sentence completion: opposites: from [facility] 1: Adult Rehab pp 61 and 62 15 9 15 8 8 8 15 9 15  15 24; pica score: 11.5= 77 Assist with: Right and left, big and  little, clean and dirty, empty and full, rough and smooth, cloudy and sunny, wet an dry, peace and war,more and less, whisper and shout, adult and child, straight and crooked, shiny and dull       15 8 15 15 8 15 9 15 8 9       6 15 8 14                                                                               Patient Education: [x]  Review HEP    []  Progressed/Changed HEP based on:_  HEP/Handouts given:Simple sentence completion: opposites: from Adult Rehab pp 52 and 62_  Patient/Caregiver instruction/education:_take the time  to process what you heard. If you don't know ask for a repeat. Explained missed answers (minutes:30 )    Other Objective/Functional Measures:    Pain Level (0-10 scale) post treatment: 7    ASSESSMENT  Patient exhibited mod difficulty with opposite phrase completions. Progressing with auditory comprehension at the abstract sentence level.    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   _    Progress towards goals / Updated goals:_  Working and progressing with STG's 1, 5. 7 other goals not yet targeted due to limited visits attended.  PLAN  []   Upgrade activities as tolerated     [x]   Continue plan of care  []   Discharge due to:__  []  Other:__    Ronnell Guadalajara, SLP 04/09/2012  12:22 PM

## 2012-05-02 NOTE — Progress Notes (Addendum)
ST DAILY TREATMENT NOTE    Patient Name: Wanda Doyle  Date:05/02/2012  DOB: 1955/06/25  [x]   Patient DOB Verified  [place]or: [place] MED[place]  Plan: [place] MED[place]ARE [place] A & B  Product Type: Medicare    In time: 12:22 Out time: 1:10  Total Treatment Time (min): 43  Total Timed Codes (min): NA  1:1 Treatment Time (MC only): 43   Visit #: 5 of 16-18     SUBJECTIVE  Pain Level (0-10 scale): 0  Any medication changes, allergies to medications, adverse drug reactions, diagnosis change, or new procedure performed?: [x]  No    []  Yes (see summary sheet for update)  Subjective functional status/changes:   [x]  No changes reported   She said that she got her friend to call the cab transport company and he got really mad with them and they changed companies. She doesn't think that transportation will be a problem anymore.   OBJECTIVE  Treatment provided includes:_Aphasia  Increase/Improve:  []   Voice Quality []   Eating/Swallowing Skills  []   Writing Skills   []   Vocal Loudness []   Cognitive Linguistic Skills []   Speech Intelligibility   []   Fluency [x]   Auditory Comprehension []   Oral Motor Skills   []   Reading Comprehension [x]   Expressive Language []   Safety Awareness   []   Breath Support/Coord. []   Articulation      Decrease:  []   Dysphagia []   Apraxia []   Dysphonia   []   Dysarthria []   Dysfluency []   Cognitive Ling. Deficit   [x]   Aphasia       Patient was able to:  Data  Pica scoring: 15= immediate, accurate, 14= named but with apraxic searching and apraxic off target (close to) response,13= accurate delayed; 12=incomplete; 10= corrected;  8= correct with cue; 6 = error.           #correct  #trials  %  Cues/Comments   VNSAnswer yes/no questions: VNS vol 1. p 21-moderately complex 1 0 1 1 1 0 1 1 0 0  12/20 60 Mod cues to take the time to process-wrong answers explained    0 1 1 0 1 1 1 0 0 1       Simple sentence completion: opposites: from [facility]: [facility] pp 61 and 62 (the ones that she missed last time) 15 15 15 15 6 15 15 15 8 6   23; pica score: 10.8= 72 Assist with: Right and left, big and little, clean and dirty, empty and full, rough and smooth, cloudy and sunny, wet an dry, peace and war,more and less, whisper and shout, adult and child, straight and crooked, shiny and dull       9 6 6 15 8 9 6 6 15 15       8 15 6              Visual confrontation naming common/less common pictured objects           20: pica score: 14.0= 93    Answer questions: responsive naming: tell fx of objects 1 1 1  1c 1 1c     4/6 67 Inc to 100% with min cues                                     Patient Education: [x]  Review HEP    []  Progressed/Changed HEP based on:_  HEP/Handouts given:Simple sentence completion: opposites: list given  the ones that were targeted today, pictures of missed vocabulary and new ones re to grooming (to name) _  Patient/Caregiver instruction/education:_take the time to process what you heard. If you don't know ask for a repeat. Explained missed answers, listen to what you say when you speak so that you can catch wrong words that you say and correct them. (minutes:30 )    Other Objective/Functional Measures:    Pain Level (0-10 scale) post treatment: 0    ASSESSMENT  Patient exhibited mod difficulty with opposite phrase completions which were given to her in HEP to study. She had moderate difficulty with auditory comprehension/processing at the abstract moderately complex sentence level for yes/no questions. Improved re to expressing her thoughts and naming.   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   _    Progress towards goals / Updated goals:_  Working and progressing with STG's 1,4, 5 6, 7 other goals not yet targeted due to limited visits attended.  PLAN  []   Upgrade activities as tolerated     [x]   Continue plan  of care  []   Discharge due to:__  []  Other:__    Ronnell Guadalajara, SLP 05/02/2012  12:22 PM

## 2012-05-08 NOTE — Progress Notes (Signed)
ST DAILY TREATMENT NOTE    Patient Name: Wanda Doyle  Date:05/08/2012  DOB: [birth year]  [x]   Patient DOB Verified  Payor: [place] MEDICARE  Plan: [place] MEDICARE PART A & B  Product Type: Medicare    In time:9:50  Out time: 10:35  Total Treatment Time (min): 45  Total Timed Codes (min): NA  1:1 Treatment Time (MC only): 45   Visit #: 6 of 16-24    SUBJECTIVE  Pain Level (0-10 scale): 6 right hip (intermittent, thobbing)  Any medication changes, allergies to medications, adverse drug reactions, diagnosis change, or new procedure performed?: [x]  No    []  Yes (see summary sheet for update)  Subjective functional status/changes:   []  No changes reported  I have to go sleep tomorrow morning at 7:30. (sleep study).She said she had 2 shots for pain for back and right hip last month and she said that she had to go back for more.     OBJECTIVE  Treatment provided includes:_re-assessment_  Increase/Improve:  []   Voice Quality []   Eating/Swallowing Skills  []   Writing Skills   []   Vocal Loudness []   Cognitive Linguistic Skills []   Speech Intelligibility   []   Fluency []   Auditory Comprehension []   Oral Motor Skills   []   Reading Comprehension []   Expressive Language []   Safety Awareness   []   Breath Support/Coord. []   Articulation      Decrease:  []   Dysphagia []   Apraxia []   Dysphonia   []   Dysarthria []   Dysfluency []   Cognitive Ling. Deficit   [x]   Aphasia       Patient was able to:  Data           #correct  #trials  %  Cues/Comments   Verbal expression: stating medical information (see subjective comments)                                                                                                                                  Patient Education: []  Review HEP    []  Progressed/Changed HEP based on:_  HEP/Handouts given:_continue previous HEP  Patient/Caregiver instruction/education:_testing instructions, results of re-assessment, continued and new STG's were discussed.(minutes:30 )    Other Objective/Functional Measures:   Objective Information:Boston Diagnostic Aphasia Examination-3rd Edition (BDAE-3rd Ed). Short form administered: scores compared to the initial assessment: Auditory Verbal Comprehension: Word Comprehension: Basic word discrimination: 15/16 chin/cheek. 60%ile ranking (no change, same error) ; commands: 70%ile ranking (increased 30) moderate deficits. ( She was able to follow 1, 2, 3 step commands with visual support with objects, partial completion of complex commands. Additional: She was partially correct for a 3 step command without visual support (auditory only) (omitted the middle command)-no change. Complex Ideational Material (yes/no abstract questions, and answering yes/no questions from spoken paragraphs): 70%ile ranking ( increased 20%) (moderate deficits) breakdown beginning at the paragraph level (improved-it was from the sentence level at the initial evaluation.  Objective Information: Boston Diagnostic Aphasia Examination-3rd Edition (BDAE-3rd Ed.) short form was administered. Conversational speech: Her conversational speech was fluent with some anomia although the flow of her speech has improved. She would use some semantic and phonemic paraphasias and did not try to correct as much. She spoke at a fast rate of speech which made her speech somewhat difficult to comprehend.  In her conversational sample about 60% of her message was communicated without visual support (gestures) (increased 60%). She would not finish some of her sentences and her sentence would sometimes trial off and the listener carried the burden of the communication for theses. Picture description of "The Cookie Thief": was at approx 80% accuracy, with a few self corrections. Content was at 90%  (increased approx 25%). Phonemic paraphasias in every sentence and in most partial sentences. She attempted self correction which were successful 50% of the time of her attempt. Approximately 30% they were not attempted. She exhibited some  minor syntax problems and there were a few semantic paraphasias.  repetition: words; 30%ile ranking (no change) breakdown at the multi-syllabic level. (severe deficits) Repetition of sentences: 60%ile ranking (increased 30% (moderate deficits, improved from severe deficits). Responsive Naming; 60%ile ranking (increased 30%) (moderate deficits, improved from severe deficits) . Visual confrontation naming: short form of Boston Naming Test (BNT) : 6/15 spontaneously named correctly;( increased 50%), 7/15 correct with semantic cue. 50%ile ranking increased 30% from the evaluation and improved from severe to moderate severity. 8/15 correct with phonemic cues.  7 multiple choice cues given after semantic and phonemic cues failed. With 4 successful pointing to the target written word from a choice of 4 words.       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  []   See Plan of Care  [x]   See progress note/recertification  [x]   Patient will continue to benefit from skilled therapy to address remaining functional deficits: see recert for this information.,    Progress towards goals / Updated goals:_  See recert  PLAN  []   Upgrade activities as tolerated     [x]   Continue plan of care-updated-recert  []   Discharge due to:__  []  Other:__    Ronnell Guadalajara, SLP 05/08/2012  9:50 AM

## 2012-05-08 NOTE — Progress Notes (Signed)
[facility] ??? [facility]  [address]  [place], [place] [address]  [phone]  [phone] fax    Continued Plan of Care/ Re-certification for Speech Therapy Services    Patient name: Wanda Doyle      Start of Care: 02/14/12  Referral source: Ernst Spell, MD     DOB: [birth year]   Diagnosis: CVA (cerebral infarction) [434.91]    Onset Date: 11/21/2011    Prior Hospitalization:see medical history    Provider#: [id number]  Prior Level of Function:Normal speech, language and memory problems. Not driving, doing own bill pay with checking           Visits from Start of Care: 6      Missed Visits: 6    The Plan of Care and following information is based on the patient's current status:  .  Goal:1. Patient will demonstrate increased auditory comprehension skills at answering simple to beginning moderately complex yes/no questions presented verbally by clinician with 80% accuracy with one repeat needed or min verbal cues.    Status at last note/certification: missed 50% of yes/no questions.    Current Status: Goal met. She was able to answer simple to beginning moderately complex questions with 85-90% accuracy with an occasional repeat and or cue to take the time to process. 60% accuracy for moderately complex.     Goal: 2. Patient will follow simple to beginning moderately 3 step commands with and without visual support demonstrate increased auditory comprehension skills at word level by pointing to objects named by clinician out of field of 2, 80% accuracy with min verbal cues.   Status at last note/certification: On BDAE: commands: 40%ile ranking; She was able to follow 1, 2, 3 step commands with visual support with objects, partial completion of complex commands. Additional: She was partially correct for a 3 step command without visual support (auditory only).      Current Status: not met due to not initiated due to work on the other goals and limited attendance. Continue goal.    Goal 3.  Patient will demonstrate increased auditory comprehension by answering yes/no and some WH questions for simple to beginning moderately complex short spoken paragraphs presented at 3/4 speed to normal speed with 70% accuracy with min cues.   Status at last note/certification: missed 50% of yes/no questions and great difficulty with answering questions from spoken paragraphs.    Current Status: not met due to not initiated due to work on lower level auditory comprehension goals first (see STG# 1) and the other goals and also due to limited attendance. Continue goal.     Goal 4:  Patient will demonstrate increased expressive language skills by naming basic objects, 60% accuracy, accepting errors that are very close to target word 50% of the time (see barriers to learning) with min-mod assist.   Status at last note/certification: severe naming deficits ( 20%ile ranking on [employer])    Current Status: not met but progressing. For 22 everyday vocabulary pictured items she attained 93% accuracy in visual confrontation naming. Continue goal for more vocabulary.    Goal 5: Patient will demonstrate increased expressive language skills by performing simple sentence completion tasks with 60% accuracy with min assist.   Status at last note/certification: poor  Current Status: not met but progressing. Goal not met due to decreased attendance.  For 24 completions she attained 77% accuracy, and 72% accuracy on the ones that she missed out of the 24. Continue goal  for more stimulus.    Goal 6 : Patient will answer simple questions and perform responsive naming tasks and simple picture description task with 60% accuracy with min-mod assist, accepting accepting errors that are very close to target word (s) 30% of the time (see barriers to learning) with min-mod assist.     Status at last note/certification: 30%ile ranking on BDAE-3rd Edition: (severe deficits). Poor picture description.     Current Status: not met due to  limited attendance and working on other goals but it was targeted one visit: progressing : 67% accuracy Continue goal for more opportunities.     Goal 7. Patient will demonstrate improved ability to use compensatory communication strategies related to recognizing when her message was successful and decreased circumlocution once her message is conveyed, utilizing more gestures, asking for repeats with mod assist.    Status at last note/certification:  Conveyed only 5% compressible speech     Current Status: progressing. She has increased this ability by approximately 30%. Continue goal as she has had decreased opportunity due to only attending 6 tx session.    Goal 7. Patient will participate in additional speech/language assessments, including reading and writing within 6 treatment sessions.     Status at last note/certification:  N/a: initial evaluation    Current Status: not met due to limited attendance and working on other goals but it was targeted one visit: progressing and partially met.  Reading: on 02/26/12: Reading assessment : Portions of Boston Diagnostic Aphasia Examination-3rd edition were administered with the following percentile rankings:short form: Basic symbol recognition: 100%ile ranking number matching: 100%ile ranking; Word identification: Picture-word matching: 40%ile missed binocular-pointed to telescope. (moderate-severe deficits)Oral reading: basic oral word reading: 40%ile; (moderate-severe deficits); oral reading of sentences with comprehension: oral sentence reading: 60%ile; (moderate deficits)Comprehension; 100%ile; Reading comprehension-sentence and paragraphs: 40%ile (moderate-severe deficits).   Writing: mechanics of writing: well-formedness of letters: 10%ile ranking, correctness of letter choice: 60%ile ranking; motor facility: 50%ile ranking.     Summary: Reading comprehension is moderately impaired at the word level and moderate-severely impaired at the sentence and paragraph  level. Oral reading of basic words and sentences is moderately impaired with phonemic paraphasias and attempts to correct them. There were also errors in changing the word to a different word (decreased attention to details). Content for writing related to mechanics showed minor errors. There was some difficulty with motoric facility and well-formedness of the letters. Her writing was legible though for the assessment conducted so far.      Key functional changes: Patient is able to comprehend more and is expressing more of her wants, needs, thoughts. She is able to communicate medical informaton with more ease. She has been able to explain issues with transportation, her right hip pain and back pain and treatments. She is exhibiting less semantic and phonemic paraphasias.      Problems/ barriers to goal attainment: excessive absences some due to transportion issues.     Problem List:      [x] aphasic  [] dysarthric  [] dysphagic       [] alexic  [] agraphic  [] dysphonia       [] dysfluency  [] Cognitive-Linguistic Disorder       [] other    Treatment Plan: Aphasia Treatment    Patient Goal (s) has been updated and includes: "I want to got my talking right"     Goals for this certification period to be accomplished in 16-24 treatments:  1. Patient will demonstrate increased auditory comprehension skills at answering moderately complex yes/no  questions presented verbally by clinician with 80% accuracy with one repeat needed or min verbal cues.   2. Patient will follow simple to beginning moderately 3 step commands with and without visual support demonstrate increased auditory comprehension skills at word level by pointing to objects named by clinician out of field of 2, 80% accuracy with min verbal cues.   3. Patient will demonstrate increased auditory comprehension by answering yes/no and some WH questions for simple to beginning moderately complex short spoken paragraphs presented at 3/4 speed to normal speed with 70%  accuracy with min cues.   4. Patient will demonstrate increased expressive language skills by naming basic objects, 80% accuracy, accepting errors that are very close to target word 40% of the time (see barriers to learning) with min-mod assist.   5. Patient will demonstrate increased expressive language skills by performing simple to beginning mod complex sentence completion tasks with 80% accuracy with min assist.   6. Patient will answer simple questions and perform responsive naming tasks and simple picture description task with 80% accuracy with min-mod assist, accepting accepting errors that are very close to target word (s) 30% of the time (see barriers to learning) with min-mod assist.   7. Patient will demonstrate improved ability to use compensatory communication strategies related to recognizing when her message was successful and decreased circumlocution once her message is conveyed, utilizing more gestures, asking for repeats with mod assist.  8. Patient will perform assorted reading comprehension tasks from the word to the beginning mod complex sentence level with 80% accuracy with min cues.    8. Patient will participate in additional speech/language assessments, writing and math computations within 6 treatment sessions.   9. Patient will receptive identify body parts on self with 90% accuracy with min cues to S to improve auditory word recognition.      Frequency / Duration: Patient to be seen  2-3 times per week for 16-24  treatments:    Assessment: Objective Information:Boston Diagnostic Aphasia Examination-3rd Edition (BDAE-3rd Ed). Short form administered: scores compared to the initial assessment: Auditory Verbal Comprehension: Word Comprehension: Basic word discrimination: 15/16 chin/cheek. 60%ile ranking (no change, same error) ; commands: 70%ile ranking (increased 30) moderate deficits. ( She was able to follow 1, 2, 3 step commands with visual support with objects, partial completion of  complex commands. Additional: She was partially correct for a 3 step command without visual support (auditory only) (omitted the middle command)-no change. Complex Ideational Material (yes/no abstract questions, and answering yes/no questions from spoken paragraphs): 70%ile ranking ( increased 20%) (moderate deficits) breakdown beginning at the paragraph level (improved-it was from the sentence level at the initial evaluation.   Objective Information: Boston Diagnostic Aphasia Examination-3rd Edition (BDAE-3rd Ed.) short form was administered. Conversational speech: Her conversational speech was fluent with some anomia although the flow of her speech has improved. She would use some semantic and phonemic paraphasias and did not try to correct as much. She spoke at a fast rate of speech which made her speech somewhat difficult to comprehend. In her conversational sample about 60% of her message was communicated without visual support (gestures) (increased 60%). She would not finish some of her sentences and her sentence would sometimes trial off and the listener carried the burden of the communication for theses. Picture description of "The Cookie Thief": was at approx 80% accuracy, with a few self corrections. Content was at 90% (increased approx 25%). Phonemic paraphasias in every sentence and in most partial sentences. She attempted  self correction which were successful 50% of the time of her attempt. Approximately 30% they were not attempted. She exhibited some minor syntax problems and there were a few semantic paraphasias. repetition: words; 30%ile ranking (no change) breakdown at the multi-syllabic level. (severe deficits) Repetition of sentences: 60%ile ranking (increased 30% (moderate deficits, improved from severe deficits). Responsive Naming; 60%ile ranking (increased 30%) (moderate deficits, improved from severe deficits) . Visual confrontation naming: short form of Boston Naming Test (BNT) : 6/15  spontaneously named correctly;( increased 50%), 7/15 correct with semantic cue. 50%ile ranking increased 30% from the evaluation and improved from severe to moderate severity. 8/15 correct with phonemic cues. 7 multiple choice cues given after semantic and phonemic cues failed. With 4 successful pointing to the target written word from a choice of 4 words.     Moderate conduction aphasia, improved from severe for auditory comprehension and verbal expression. Moderate reading deficits, with  moderate-severely impairement at the sentence and paragraph level.       Recommendations:Continue speech therapy to address the above mentioned deficits as it is medically necessary.     G Codes:    Expression: [id number] Current  CL= 60-79%  [id number] Goals  CK= 40-59%    The severity rating is based on the following outcomes:    National Outcomes Measures (NOMS)    Certification Period: 05/15/2012 to 08/13/2012    Ronnell Guadalajara, SLP 05/08/2012 10:31 AM    ________________________________________________________________________  I certify that the above Therapy Services are being furnished while the patient is under my care. I agree with the treatment plan and certify that this therapy is necessary.    []   I have read the above report and request that my patient continue as recommended.  []   I have read the above report and request that my patient continue therapy with the following changes/special instructions:________________________________________  [] I have read the above report and request that my patient be discharged from therapy.    Physician's Signature:_________________ Date:___________Time:__________      Please sign and return to [facility] ??? [facility]   [address]  [place], [place] [address]  [phone]   [phone] fax

## 2012-05-17 NOTE — Progress Notes (Addendum)
ST DAILY TREATMENT NOTE    Patient Name: Wanda Doyle  Date:05/17/2012  DOB: 04-29-1955  [x]   Patient DOB Verified  Payor: [place]  Plan: [place] MED[place] A & B  Product Type: Medicare    In time: 11: 35  Out time: 12:20  Total Treatment Time (min): 45  Total Timed Codes (min): NA  1:1 Treatment Time (MC only): 45   Visit #: 1 of 16-24    SUBJECTIVE  Pain Level (0-10 scale): 0  Any medication changes, allergies to medications, adverse drug reactions, diagnosis change, or new procedure performed?: [x]  No    []  Yes (see summary sheet for update)  Subjective functional status/changes:   [x]  No changes reported  She said that she does not have much appetite but denies any difficulty with swallowing, chewing. She said that the next time she goes to her doctor she will ask him for something for her appetite.    OBJECTIVE  Treatment provided includes:_aphasia_  Increase/Improve:  []   Voice Quality []   Eating/Swallowing Skills  []   Writing Skills   []   Vocal Loudness []   Cognitive Linguistic Skills []   Speech Intelligibility   []   Fluency [x]   Auditory Comprehension []   Oral Motor Skills   []   Reading Comprehension [x]   Expressive Language []   Safety Awareness   []   Breath Support/Coord. []   Articulation      Decrease:  []   Dysphagia []   Apraxia []   Dysphonia   []   Dysarthria []   Dysfluency []   Cognitive Ling. Deficit   [x]   Aphasia       Patient was able to:  Data  Pica scoring: 15= immediate, accurate, 14= named but with apraxic searching and apraxic off target (close to) response,13= accurate delayed; 12=incomplete; 10= corrected;  8= correct with cue; 6 = error.Pica scoring: 15= immediate, accurate, 14= named but with apraxic searching and apraxic off target (close to) response,13= accurate delayed; 12=incomplete; 10= corrected;  8= correct with cue; 6 = error.           #correct  #trials  %  Cues/Comments   Receptive ID of body parts           22 93 Trouble with chin, eyebrow and wrist: min cues   Answer questions  re to self, orientation to time, biographical information 8 8 13 13 15 6 6 15 8 13  10:  Pica score: 11.5 77  Orientation to time: Current month, current year, current date, age, dob, current address,city, state, zip code, name of motel that she lives in; Dr's name, type of insurance, phone number                   State current address 8 8 13 8 11 11 11 11 14 13 10  Pica score: 9.4= 63 Mod cues to plan and use tapping   State medical insurance 6 8 8 13 13 8 13 13 13 13  10: pica score: 10.5= 70 With min cues   Sentence completions:  Responsive naming:  Opposites (previously missed) 15 9 15 14 15 15 13 9 15 8  10: pica score 12.8= 85   She needed a few reps of stimulus and 2 needed min cues.                                     Patient Education: [x]  Review HEP    []  Progressed/Changed  HEP based on:_  HEP/Handouts given:_opposites from today, missed items from today's questions  Patient/Caregiver instruction/education: cuing with missed items today, plan assist with missed it; plan what you to say, tap out each syllable slowly. Stay louder to the end of your utterance, don't get softer._  (minutes: 30 )    Other Objective/Functional Measures:    Pain Level (0-10 scale) post treatment: 0    ASSESSMENT  Progressing with sentence completion opposites missed last session. Mod difficulty answering questions about orientation to time, biographical information. Min difficulty with receptive ID of body parts.  []   See Plan of Care  []   See progress note/recertification  []   Patient will continue to benefit from skilled therapy to address remaining functional deficits: Moderate conduction aphasia, with deficits in auditory comprehension and verbal expression. Moderate reading deficits, with moderate-severely impairement at the sentence and paragraph level.     G Codes:   Expression: G9162 Current CL= 60-79%   [id number] Goals CK= 40-59%   The severity rating is based on the following outcomes:   National Outcomes Measures (NOMS)     Progress towards goals / Updated goals:_  Progressing with STG# 9, 5 and 6. The other goals were not targeted yet.   PLAN  []   Upgrade activities as tolerated     [x]   Continue plan of care  []   Discharge due to:__  []  Other:__    Ronnell Guadalajara, SLP 05/17/2012  11:40 AM

## 2012-06-06 NOTE — Progress Notes (Signed)
ST DAILY TREATMENT NOTE    Patient Name: Wanda Doyle  Date:06/06/2012  DOB: 13-Jan-1955  [x]   Patient DOB Verified  Payor: [place] MEDICARE  Plan: [place] MEDICARE PART A & B  Product Type: Medicare    In time: 10: 10  Out time: 10:50 (patient 10 minutes late due to transportation issues)   Total Treatment Time (min): 40  Total Timed Codes (min): NA  1:1 Treatment Time (MC only): 40  Visit #: 2 of 16-24    SUBJECTIVE  Pain Level (0-10 scale): 0  Any medication changes, allergies to medications, adverse drug reactions, diagnosis change, or new procedure performed?: [x]  No    []  Yes (see summary sheet for update)  Subjective functional status/changes:   [x]  No changes reported  She said that she she had started PT at [facility] but did not have that information to give me. She said that she is is to return to Dr. Girtha Hake soon and went to a pain center. She still does not have much appetite and will address this issue with him at her next appointment.She apologized for being late-said the medicaid cab was late and it did not matter what time they tell them they are still late.  OBJECTIVE  Treatment provided includes:_aphasia treatment/mini-re-assessment as she had a seizure she said on 5/16. _  Increase/Improve:  []   Voice Quality []   Eating/Swallowing Skills  []   Writing Skills   []   Vocal Loudness []   Cognitive Linguistic Skills []   Speech Intelligibility   []   Fluency [x]   Auditory Comprehension []   Oral Motor Skills   []   Reading Comprehension [x]   Expressive Language []   Safety Awareness   []   Breath Support/Coord. []   Articulation      Decrease:  []   Dysphagia []   Apraxia []   Dysphonia   []   Dysarthria []   Dysfluency []   Cognitive Ling. Deficit   [x]   Aphasia       Patient was able to:  Data  Pica scoring: 15= immediate, accurate, 14= named but with apraxic searching and apraxic off target (close to) response,13= accurate delayed; 12=incomplete; 10= corrected;  8= correct with cue; 6 = error.Pica scoring: 15= immediate,  accurate, 14= named but with apraxic searching and apraxic off target (close to) response,13= accurate delayed; 12=incomplete; 10= corrected;  8= correct with cue; 6 = error.           #correct  #trials  %  Cues/Comments   Receptive ID of body parts           22 100    Answer questions re to self, orientation to time, biographical information 8 8 15 15 15 15 15 15 15 15  10:  Pica score: 13.6 91  Orientation to time: Current month, current year, current date, age, dob, current address,city, state, zip code, name of motel that she lives in; Dr's name, type of insurance, phone number (increased 14% from last session)   Visual confrontation naming: electrical appliances            20: pica score: 11.1= 74 Difficulty pronouncing vacuum, crockpot (kept saying crackpot), freezer, remote control, refrigerator. frequent perseveration for the last 10 items compared to the first.See data sheet with stimulus and scores in paper chart    Trials: imitative to produce the words missed with naming:            10 minutes  The only word she was successful woth was remote control. Mod verbal/visual cues re  to tapping out each syllable, reading each words/syllable correctly   Verbal expression-expressing thoughts-medical, appointments, feelings            70% With listener bearing more than half the burden,  Requests for repeats and clarification with patient unable to impart portions of information-said she could not recall or remember.    Recognizing her inability to communicate her message effectively             approx 10% recognition in conversation today      Patient Education: [x]  Review HEP    []  Progressed/Changed HEP based on:_  HEP/Handouts given:_new vocabulary words and corresponding picturespictures  Patient/Caregiver instruction/education:  As you are reading the word say it syllable for syllable. Assist with word production for multi-syllabic words missed. Slow your speech and take your time.plan what you want to say.  Do not keep saying the same thing for words that we have finished going over.     (minutes: 30 )    Other Objective/Functional Measures:Boston Diagnostic Aphasia Examination-3rd Edition (BDAE-3rd Ed. Short and long versons  Compared to 05/08/12: Auditory word recognition:short form  100%ile ranking (inShe was able to follow 1, 2, 3 step commands, partial completion of complex commands. No change: 40%ile ranking (missed complex) Complex Ideational Material (yes/no abstract questions, and answering yes/no questions from spoken paragraphs): long form: 10/12 : 80%ile ranking  Short form  (Inc 30% from 05/08/12 for short form all correct on these). Responsive naming: short form: 100%ile ranking (inc 40%).    Pain Level (0-10 scale) post treatment: 0    ASSESSMENT  Re-assessment shows no decline in her communication skills. In fact she improved on standardized aphasia re-assessment (short assessment). Significant improvement stating biographical information. Min A with stating current time with semantic paraphasias. Moderate difficulty with visual confrontation naming for new core of vocabulary today. Some errors due to patient having difficulty with pronounciation (phonemic paraphasias and perseverations noted). Not much improvement with practicing the missed words, made the same phonemic errors. Great with receptive ID of body parts. She was able to express more of her thoughts re to medical information, upcoming appointments, her frustrations with   []   See Plan of Care  []   See progress note/recertification  []   Patient will continue to benefit from skilled therapy to address remaining functional deficits: Moderate conduction aphasia, with now mild deficits in auditory comprehension. mild and verbal expression. Moderate reading deficits, with moderate-severely impairement at the sentence and paragraph level.     Progress towards goals / Updated goals:_  Progressing with STG# 1,4,6, 7, 8 Goal # 9 met. Progressing with  STG's 5   The other goals were not targeted yet.   PLAN  []   Upgrade activities as tolerated     [x]   Continue plan of care  []   Discharge due to:__  []  Other:__    Ronnell Guadalajara, SLP 06/06/2012  10:10  AM (note : check in time said 11:13 but error: it was off by ~1 hr

## 2012-06-19 NOTE — Progress Notes (Signed)
ST DAILY TREATMENT NOTE    Patient Name: Wanda Doyle  Date:06/19/2012  DOB: 1955-07-25  [x]   Patient DOB Verified  Payor: [place] MEDICARE  Plan: [place] MEDICARE PART A & B  Product Type: Medicare    In time: 9: 12  Out time: 10:00   Total Treatment Time (min): 48  Total Timed Codes (min): NA  1:1 Treatment Time (MC only): 48  Visit #: 3 of 16-24    SUBJECTIVE  Pain Level (0-10 scale): 0 but it is worse with her   Any medication changes, allergies to medications, adverse drug reactions, diagnosis change, or new procedure performed?: [x]  No    []  Yes (see summary sheet for update)  Subjective functional status/changes:   [x]  No changes reported  She said that she not started any PT. This left side (pointing to her right) is bothering her when she walks-it comes and goes. I can't walk on it-my shoes. She said that social services had scheduled appointment that was in conflict with her appointment on June 9th. I had mail in my drawer that I didn't open. She called on that morning   OBJECTIVE  Treatment provided includes:_aphasia _  Increase/Improve:  []   Voice Quality []   Eating/Swallowing Skills  []   Writing Skills   []   Vocal Loudness []   Cognitive Linguistic Skills [x]   Speech Intelligibility   []   Fluency [x]   Auditory Comprehension []   Oral Motor Skills   []   Reading Comprehension [x]   Expressive Language []   Safety Awareness   []   Breath Support/Coord. [x]   Articulation-motor speech      Decrease:  []   Dysphagia [x]   Apraxia []   Dysphonia   []   Dysarthria []   Dysfluency []   Cognitive Ling. Deficit   [x]   Aphasia       Patient was able to:  Data  Pica scoring: 15= immediate, accurate, 14= named but with apraxic searching and apraxic off target (close to) response,13= accurate delayed; 12=incomplete; 10= corrected;  8= correct with cue; 6 = error.Pica scoring: 15= immediate, accurate, 14= named but with apraxic searching and apraxic off target (close to) response,13= accurate delayed; 12=incomplete; 10= corrected;  8=  correct with cue; 6 = error.           #correct  #trials  %  Cues/Comments   Auditory comp yes/no and wh questions-spoken paragraphs 3-6 statements. p 159 paragraph # 1 1 0 1c 1 0c      2/5 40 Inc to 60% with min cue.   1 repeat of paragraph #1 1 0c 1 1 0c      3/5 60 No improvement with mult choice cues.   Unable to repeat 'smith'     (dec length/complexity to level 1 3 sentences paragraphs answering yes/no and wh questions: p 157 #1 1 1 1 1 1       5/5 100    #2  1 1 1  1c       3/4 75 Inc to 100% with mult choice cue   #3 1 1c 1 1 1       5/5 100 Min cue to plan first and speak slowly   #4 1 1 1 1  0c 1     5/6 83                 89% avg acc for the 4 paragraphs, inc to 96% acc with min cues.   Answer questions re to self, orientation to time, biographical information 15 10  10 10 15 15 15 15 10 15  13:  Pica score: 12.7 85  Orientation to time: Current month, current year, current date, age, dob, current address,city, state, zip code, name of motel that she lives in; Dr's name, type of insurance, phone number (increased 14% from last session) (scores decreased from 15 to 10 if she did not plan first and self corrected)    15 12 8              Visual confrontation naming: electrical appliances            20: pica score: 11.6= 77 Difficulty pronouncing several of the words: worked on tapping, speech strategies to produce them; .See data sheet with stimulus and scores in paper chart : increased 3% from last session.   Trials: imitative to produce the words missed with naming:            10 minutes  ~40 . Mod verbal/visual cues re to tapping out each syllable, reading each words/syllable correctly   Verbal expression-expressing thoughts-medical, appointments,             8 minutes 60% With listener bearing more than half the burden,  Requests for repeats and clarification with patient unable to impart portions of information-decreased planning and decreased referencing her pronouns    Recognizing her inability to  communicate her message effectively             approx 20% recognition in conversation today      Patient Education: [x]  Review HEP    []  Progressed/Changed HEP based on:_  HEP/Handouts given:_same HEP as last time   Patient/Caregiver instruction/education:  Speak slowly, over-exaggerate your speech-say each sound and syllable. Reference what you want to say. Plan what you want to say before speaking.      (minutes: 20 )    Other Objective/Functional Measures:    Pain Level (0-10 scale) post treatment: 0    ASSESSMENT    []   See Plan of Care  []   See progress note/recertification  []   Patient will continue to benefit from skilled therapy to address remaining functional deficits: Moderate conduction aphasia, with now mild deficits in auditory comprehension. mild and verbal expression. Moderate reading deficits, with moderate-severely impairement at the sentence and paragraph level.     Progress towards goals / Updated goals:_   Decreased planning today affected stating biographical information and naming pictured vocabulary. Decreased semantic paraphasias and perseveration today. She appears to have some dysarthria as well as aphasia. She is generally not aware of decreased ability to convey her message in conversation. Progressing with STG# 1,4,6, 7, 8 Goal # 9 met. Progressing with STG's 5 Working on STG# 3.   The other goals were not targeted yet.   PLAN  []   Upgrade activities as tolerated     [x]   Continue plan of care  []   Discharge due to:__  []  Other:__    Ronnell Guadalajara, SLP 06/19/2012  9:12 AM

## 2012-06-26 NOTE — Progress Notes (Signed)
ST DAILY TREATMENT NOTE    Patient Name: Wanda Doyle  Date:06/26/2012  DOB: 05-12-1955  [x]   Patient DOB Verified  Payor: [place] MEDICARE  Plan: [place] MEDICARE PART A & B  Product Type: Medicare    In time: 9: 10  Out time: 10:50   Total Treatment Time (min): 40  Total Timed Codes (min): NA  1:1 Treatment Time (MC only): 40  Visit #: 4 of 16-24    SUBJECTIVE  Pain Level (0-10 scale): 0 but it is worse with her   Any medication changes, allergies to medications, adverse drug reactions, diagnosis change, or new procedure performed?: [x]  No    []  Yes (see summary sheet for update)  Subjective functional status/changes:   [x]  No changes reported  She said that her brother died on May 28, 2022 and that she is going to [facility] for the funeral (min cues to slow down and organize her speech). Last time she was weighed she 97 lb. I am staying the 90's. I used to weigh 112 dollars... She said that the dr called her to see another kind of doctor. She said she has an appointment with an gyn md-she has to have a papsmear or some other procedure-she did not know the name of it-conflict with her June 30th appointment.    OBJECTIVE  Treatment provided includes:_aphasia _  Increase/Improve:  []   Voice Quality []   Eating/Swallowing Skills  []   Writing Skills   []   Vocal Loudness []   Cognitive Linguistic Skills [x]   Speech Intelligibility   []   Fluency [x]   Auditory Comprehension []   Oral Motor Skills   []   Reading Comprehension [x]   Expressive Language []   Safety Awareness   []   Breath Support/Coord. [x]   Articulation-motor speech      Decrease:  []   Dysphagia [x]   Apraxia []   Dysphonia   []   Dysarthria []   Dysfluency []   Cognitive Ling. Deficit   [x]   Aphasia       Patient was able to:  Data  Pica scoring: 15= immediate, accurate, 14= named but with apraxic searching and apraxic off target (close to) response,13= accurate delayed; 12=incomplete; 10= corrected;  8= correct with cue; 6 = error.Pica scoring: 15= immediate, accurate, 14=  named but with apraxic searching and apraxic off target (close to) response,13= accurate delayed; 12=incomplete; 10= corrected;  8= correct with cue; 6 = error.           #correct  #trials  %  Cues/Comments   Auditory comp yes/no and wh questions-spoken paragraphs 3-6 statements. p 159 paragraph # 1 1 0 1c 1 0c      2/5 40 Inc to 60% with min cue.   1 repeat of paragraph #1 1 0c 1 1 0c      3/5 60 No improvement with mult choice cues.   Unable to repeat 'smith'     (dec length/complexity to level 1 3 sentences paragraphs answering yes/no and wh questions: p 157 #1 1 1 1 1 1       5/5 100    #2  1 1 1  1c       3/4 75 Inc to 100% with mult choice cue   #3 1 1c 1 1 1       5/5 100 Min cue to plan first and speak slowly   #4 1 1 1 1  0c 1     5/6 83  89% avg acc for the 4 paragraphs, inc to 96% acc with min cues.   Answer questions re to self, orientation to time, biographical information 15 15 10 15 15 10 13 15 15 15  13:  Pica score: 13.5 90  Orientation to time: Current month, current year, current date, age, dob, current address,city, state, zip code, name of motel that she lives in; Dr's name, type of insurance, phone number (increased 5% from last session)     15 15 8              Visual confrontation naming: electrical appliances            20: pica score: 11.6= 80 Difficulty pronouncing several of the words: worked on tapping, speech strategies to produce them which helped just a little; Marland KitchenSee data sheet with stimulus and scores in paper chart : increased 3% from last session.   Trials: imitative to produce the words missed with naming:            15 minutes  ~50 . Mod verbal/visual cues re to tapping out each syllable, reading each words/syllable correctly : for smoke alarm; extension cord, sewing machine, remote control, coffee maker, heating pad; refrigerator, crackpot/crockpot, mixer    Verbal expression-expressing thoughts-medical, appointments,             7 minutes ~75% With listener bearing  little more than half the burden,  Requests for repeats and clarification occasionally and min cues to take her time, plan and choose her words carefully (see subjective comments by patient)     Recognizing her inability to communicate her message effectively             approx 40% recognition in conversation today      Patient Education: [x]  Review HEP    []  Progressed/Changed HEP based on:_  HEP/Handouts given:_same HEP as last time -added the names on the back of more of the pictures   Patient/Caregiver instruction/education:  Speak slowly, over-exaggerate your speech-say each sound and syllable. Reference what you want to say. Plan what you want to say before speaking.  Stop smoking completely . Say each sound and syllable for the words with tapping slowly, pacing. Don't forget to say the first. The name of the picture was added to the back last time-be sure to study them.   (minutes: 20 )    Other Objective/Functional Measures:    Pain Level (0-10 scale) post treatment: 0    ASSESSMENT    []   See Plan of Care  []   See progress note/recertification  []   Patient will continue to benefit from skilled therapy to address remaining functional deficits: Moderate conduction aphasia, with now mild deficits in auditory comprehension. mild and verbal expression. Moderate reading deficits, with moderate-severely impairement at the sentence and paragraph level.     Progress towards goals / Updated goals:_   Decreased planning today affected naming pictures today, with min improvement with speech strategies of tapping pacing. Better with stating biographical information-still a problem with decreased planning and production.  Decreased semantic paraphasias and perseveration today. She appears to have some mild articulation deficits pre-stroke per patient. She had increased ability to convey her message in conversation re to self, family and medical information.    . Progressing with STG# 1,4,6, 7, 8 Goal # 9 met. Progressing  with STG's 5 Working on STG# 3.   The other goals were not targeted yet.   PLAN  []   Upgrade activities as tolerated     [  x]  Continue plan of care  []   Discharge due to:__  []  Other:__    Ronnell Guadalajara, SLP 06/26/2012  9:10 AM

## 2012-07-23 NOTE — Progress Notes (Signed)
[facility] ??? [facility]  [address]  [place], [place] [address]  [phone]  [phone] fax    Speech Therapy Discharge Summary    Patient name: Wanda Doyle Referral source: Ernst Spell, MD   DOB: May 31, 1955 Diagnosis: CVA (cerebral infarction) [434.91]     Start of Care: 02/14/12    Visits from Start of Care: 10 Missed Visits: 15   Reporting Period:05/15/12  to 07/23/12        Summary of Care:  Status with Short Term Goals:  Goal:1. Patient will demonstrate increased auditory comprehension skills at answering moderately complex yes/no questions presented verbally by clinician with 80% accuracy with one repeat needed or min verbal cues.     Status at last note/certification: She was able to answer simple to beginning moderately complex questions with 85-90% accuracy with an occasional repeat and or cue to take the time to process. 60% accuracy for moderately complex.     Status at discharge: not met: not initiated due to work on the other goals and limited attendance.      Goal:2. Patient will follow simple to beginning moderately 3 step commands with and without visual support demonstrate increased auditory comprehension skills at word level by pointing to objects named by clinician out of field of 2, 80% accuracy with min verbal cues.   Status at last note/certification:not initiated due to work on the other goals and limited attendance.     Status at discharge: not met    Goal:3. Patient will demonstrate increased auditory comprehension by answering yes/no and some WH questions for simple to beginning moderately complex short spoken paragraphs presented at 3/4 speed to normal speed with 70% accuracy with min cues.   Status at last note/certification: not initiated due to work on lower level comprehension goals first and limited attendance. 60% accuracy for moderately complex yes/no questions.    Status at discharge: For level 1 sentence paragraphs, 89% avg acc for the 4  paragraphs, inc to 96% acc with min cues.   not met due to limited attendance.    Goal 4. Patient will demonstrate increased expressive language skills by naming basic objects, 80% accuracy, accepting errors that are very close to target word 40% of the time (see barriers to learning) with min-mod assist.   :  Status at last note/certification: progressing. For 22 everyday vocabulary pictured items she attained 93% accuracy in visual confrontation naming. Continue goal for more vocabulary.    Status at discharge: not met due to limited attendance, but progressing.Visual confrontation naming: electrical appliances (20) 80% accuracy    Goal:5. Patient will demonstrate increased expressive language skills by performing simple to beginning mod complex sentence completion tasks with 80% accuracy with min assist.   Status at last note/certification: For 24 completions she attained 77% accuracy, and 72% accuracy on the ones that she missed out of the 24. Continue goal for more stimulus.     Status at discharge: not met due to decreased targeting due to poor attendence. For 10 0pposites (previously missed) 85% accuracy.     Goal:6. Patient will answer simple questions and perform responsive naming tasks and simple picture description task with 80% accuracy with min-mod assist, accepting accepting errors that are very close to target word (s) 30% of the time (see barriers to learning) with min-mod assist.   Status at last note/certification: not met due to limited attendance and working on other goals but it was targeted one visit: progressing :  67% accuracy Continue goal for more opportunities.    Status at discharge: not met due to limited attendance but progressing. For answering questions re to self, orientation to time, biographical information: 90% accuracy.; Expressing thoughts re information from medical appointments: 75% accuracy     Goal:7. Patient will demonstrate improved ability to use compensatory communication  strategies related to recognizing when her message was successful and decreased circumlocution once her message is conveyed, utilizing more gestures, asking for repeats with mod assist.   Status at last note/certification: increased approx 30% but still with decreased opportunity due to patient's excessive cancellations  Status at discharge: not met but progressing. Range 10-. 40% accuracy (at last visit it was 40%)    Goal 8:  Patient will perform assorted reading comprehension tasks from the word to the beginning mod complex sentence level with 80% accuracy with min cues.   Status at last note/certification: not a previous goal  Status at discharge: not met due to not initiated due to limited attendance and work on her other goals.     Goal:  9. Patient will participate in additional speech/language assessments, writing and math computations within 6 treatment sessions.   Status at last note/certification: she had reading assessment but no others due to limited attendance and work on other goals.  Status at discharge: not met due to limited attendance    Goal 10: Patient will receptive identify body parts on self with 90% accuracy with min cues to S to improve auditory word recognition.   Status at last note/certification: not previously targeted goal  Status at discharge:  Met with 92% accuracy.     Status with Long Term Goals  Goal:1. Patient will demonstrate increased auditory comprehension skills at moderately complex sentence level to short paragraph level with 75% accuracy with min assist.   Status at last note/certification:progressing  Status at discharge: not menot met due to  poor attendance.    Goal:2. Patient will demonstrate increased expressive language skills to communicate her wants, needs, and thoughts for functional communication related to self, home and medical and basic community with 60% accuracy with min-mod assist and with alternative/augmentative communication device or gestures if needed.      Status at last note/certification: progressing    Status at discharge: not met due to poor attendance, but progressed. She was able to express her medical information from her doctor visits with ~75% accuracy with min cues.  She was able to answer questions re to self, orientation to time, biographical information: 90% accuracy.    Assessment at her last visit 06/26/12: Decreased planning today affected naming pictures today, with min improvement with speech strategies of tapping pacing. Better with stating biographical information-still a problem with decreased planning and production. Decreased semantic paraphasias and perseveration today. She appears to have some mild articulation deficits pre-stroke per patient. She had increased ability to convey her message in conversation re to self, family and medical information.    ASSESSMENT: Patient has missed the past 2 treatment sessions due to transportation not picking her up she said. She did not return for re-assessment prior to discharge.     G Codes:  Since she was unexpected d/c due to cancelling tx and then d/c'd due to too many absences she was not re-assessed so not G codes will be applied.   This SLP spoke with patient and the man who she lives with regarding patient's excessive and continued absences. The clinic policy is 3 or more no shows or  cancellations a patient can be discharged and the physician notified.  They said that the medicaid transportation was not picking her up and at time no available driver. He said that since the distance was so short that the cab drivers may have wanted more money with a longer drive. (she lives about 8 minutes away from the clinic). However, there is another patient that comes that lives just 5 minutes away and there has been no trouble with transportation. Patient is being discharged due to poor attendance due to so many cancellations.     ( A copy of the telephone logs can be requested by her physician documenting  the many cancellations and telephone discussion with patient and her s/o).    RECOMMENDATIONS:  [x] Discontinue therapy: [] Patient has reached or is progressing toward set goals      [] Patient is non-compliant or has abdicated        [x] Due to lack of appreciable progress towards set goals due to poor attendance.    Ronnell Guadalajara, SLP 07/23/2012 11:53 AM

## 2012-10-30 LAB — CBC WITH AUTOMATED DIFF
BASOPHILS: 0.3 % (ref 0–3)
EOSINOPHILS: 0.9 % (ref 0–5)
HCT: 32.2 % — ABNORMAL LOW (ref 37.0–50.0)
HGB: 11.2 gm/dl — ABNORMAL LOW (ref 13.0–17.2)
IMMATURE GRANULOCYTES: 0.2 % (ref 0.0–3.0)
LYMPHOCYTES: 27.9 % — ABNORMAL LOW (ref 28–48)
MCH: 28.6 pg (ref 25.4–34.6)
MCHC: 34.8 gm/dl (ref 30.0–36.0)
MCV: 82.1 fL (ref 80.0–98.0)
MONOCYTES: 9.5 % (ref 1–13)
MPV: 10.8 fL — ABNORMAL HIGH (ref 6.0–10.0)
NEUTROPHILS: 61.2 % (ref 34–64)
NRBC: 0 (ref 0–0)
PLATELET: 282 10*3/uL (ref 140–450)
RBC: 3.92 M/uL (ref 3.60–5.20)
RDW-SD: 36.7 (ref 36.4–46.3)
WBC: 6.6 10*3/uL (ref 4.0–11.0)

## 2012-10-30 LAB — METABOLIC PANEL, BASIC
BUN: 13 mg/dl (ref 7–25)
CO2: 26 mEq/L (ref 21–32)
Calcium: 8.6 mg/dl (ref 8.5–10.1)
Chloride: 106 mEq/L (ref 98–107)
Creatinine: 1 mg/dl (ref 0.6–1.3)
GFR est AA: >60
GFR est non-AA: 60
Glucose: 85 mg/dl (ref 74–106)
Potassium: 3.9 mEq/L (ref 3.5–5.1)
Sodium: 138 mEq/L (ref 136–145)

## 2012-10-30 NOTE — ED Provider Notes (Signed)
[facility]  EMERGENCY DEPARTMENT TREATMENT REPORT  NAME:  Wanda Doyle  SEX:   F  ADMIT: 10/30/2012  DOB:   1955-07-22  MR#    [id number]  ROOM:    TIME DICTATED: 01 32 PM  ACCT#  [id number]    cc: Estrellita Ludwig M.D.    PRIMARY CARE PHYSICIAN:  Estrellita Ludwig, MD     EVALUATION TIME:   9:25 a.m.    CHIEF COMPLAINT:  Seizure and fall.    HISTORY OF PRESENT ILLNESS:  A 57 year old female had a seizure yesterday and has pain in her right wrist   and her left cheek following the seizure.  She states that she has been placed   on Keppra recently, and she has not had a seizure in a few weeks.  She denies   a severe headache or vision changes.  Denies any pain in her neck or back,   was concerned because the facial swelling to be evaluated.    REVIEW OF SYSTEMS:  CONSTITUTIONAL:  No fevers, no chills.  EYES:  No vision changes.  ENT:  No congestion.  RESPIRATORY:  Intermittent mild cough, no dyspnea.  GASTROINTESTINAL:  No vomiting.  She has chronic abdominal pain and is being   worked up by her doctor as an outpatient.  MUSCULOSKELETAL:  Right wrist pain and facial pain.  SKIN:  No lacerations.  NEUROLOGIC:  Seizure yesterday.    PAST MEDICAL HISTORY:  Chronic back pain.  She has hepatitis C, hypertension, seizure disorder, prior   stroke, arthritis, asthma, hysterectomy, anxiety.    SOCIAL HISTORY:  Is a smoker.    FAMILY HISTORY:  Not known.    ALLERGIES:  PENICILLIN.    MEDICATIONS:  Listed and reviewed in [facility].    PHYSICAL EXAMINATION:  GENERAL APPEARANCE:  Patient appears well developed and well nourished.    Appearance and behavior are age and situation appropriate.  VITAL SIGNS:  Blood pressure 122/70, pulse 72, respirations 17, temperature   98.2, rating pain at an 8, 99% on room air.  HEENT:  Eyes:  Pupils equal, symmetrical and normally reactive.  The patient   has bruising and swelling below the left eye which is tender in the   infraorbital region.  She has extraocular movements intact; however, she  is a   little sluggish with her left eye saying it hurts to try to move it.    Mouth/Throat:  Surfaces of the pharynx, palate, and tongue are pink, moist,   and without lesions.  NECK:  Supple, nontender, symmetrical, no masses or JVD, trachea midline,   thyroid not enlarged, nodular, or tender.  No cervical or submandibular   lymphadenopathy palpated.  RESPIRATORY:  Clear and equal breath sounds.  No respiratory distress,   tachypnea, or accessory muscle use.   CARDIOVASCULAR:   Heart regular, without murmurs, gallops, rubs, or thrills.    GASTROINTESTINAL:  Abdomen is soft.  She complains of generalized pain, but   nothing that has been tender to palpation.  Again this has been a chronic   problem.  She has been worked up as an outpatient by her doctor.    SPINE:  There is no localized cervical, thoracic, lumbar or sacral body   tenderness to palpation or fist percussion.  There are no bony step-offs,   ecchymosis, areas of soft tissue swelling or deformities.    EXTREMITIES:  Intact throughout with the exception of her right wrist which is   sore.  She  says that this is also chronically painful but worse after she had   a seizure last night.  She does not have any pain or snuff box or in her   elbow or shoulder with intact range of motion.  NEUROLOGIC:  The patient is awake, alert, converses normally.  She has normal   speech and sensation.  She has normal strength throughout.  PSYCHIATRIC:  Recent and remote memory appear to be intact.     ASSESSMENT AND MANAGEMENT PLAN:  The patient after a seizure last evening having some facial pain.  She has   bruising, swelling and some pain with extraocular movements.  We will get a CT   to look for any acute fracture of the orbit.  She has no headache or   neurologic symptoms.  I do not think we need to do a head CT.  We will check a   chemistry to look for any electrolyte abnormalities as well as get an x-ray    of her wrist.  She says she is taking her Keppra as prescribed.  Would have   her follow up with her doctors as an outpatient to see if they want to adjust   the medication.    DIAGNOSTIC INTERPRETATION:  CT orbit shows a small left intraorbital soft tissue hematoma and ethmoid   sinusitis.  No fracture.  Chemistry and CBC are unremarkable.  Wrist film   showed soft tissue swelling with a deformity of the radius and ulna from an   old fracture and otherwise normal study by Dr. Jodie Echevaria.  Dr. Jimmey Ralph did not see   any acute S-T segment or T-wave abnormalities that are consistent with acute   ischemia or infarction.     COURSE IN THE EMERGENCY ROOM:    The patient was given Tylenol.  She was placed in a wrist splint.  She is   advised to ice and elevate and follow up with her family doctor as an   outpatient.  She is given a prescription for Norco to take 1/2 to 1 tablet as   needed, dispensed 10.  Advised these can make her drowsy.  She is to see her   doctor concerning her seizure as well.    DIAGNOSES:   1.  Seizure.   2.  Facial contusion.  3.  Wrist sprain.    DISPOSITION:   Home.  The patient was personally evaluated by myself and Dr. Jimmey Ralph who   agrees with the above assessment and plan.   The patient is discharged home in   stable condition, with instructions to follow up with their regular doctor.    They are advised to return immediately for any worsening or symptoms of   concern.     CONTINUATION BY Calayah Guadarrama A. Jimmey Ralph, MD:    I interviewed and examined the patient.  I discussed with the mid-level   provider and agree with their evaluation and plan as documented here.  This is   a 57 year old female who has a history of a seizure disorder.  She had a   breakthrough seizure.  She also hit her face and sprained her wrist.  At this   time, there is no evidence of any fracture.  We are sending her home with   Norco for pain.  She is to follow up with Dr. Girtha Hake and return for any change   or worsening.       ___________________  Johny Drilling MD  Dictated By:  Marlana Salvage, PA    My signature above authenticates this document and my orders, the final  diagnosis (es), discharge prescription (s), and instructions in the [facility]   [facility] record.  Nursing notes have been reviewed by the physician/mid-level provider.    If you have any questions please contact [phone].    LH  D:10/30/2012 13:32:05  T: 10/30/2012 14:22:00  [id number]  Authenticated by Johny Drilling, M.D. On 11/06/2012 03:45:19 PM

## 2013-03-18 NOTE — Progress Notes (Signed)
Advance Care Planning:   Patient was offered the opportunity to discuss advance care planning YES   Does patient have an Advance Directive:  NO   If no, did you provide information on [facility]?  YES

## 2013-03-18 NOTE — Patient Instructions (Signed)
Hepatitis C: After Your Visit  Your Care Instructions  Hepatitis C is an infection of the liver caused by a virus. This virus spreads when blood or body fluids from an infected person enter another person's body. This occurs most often when people share needles that have the virus on them. In the past, people got the virus through blood transfusions and organ transplants. But since 1992, all donated blood and organs have been screened for hepatitis C. So getting the virus this way is now very rare.  Less often, hepatitis C can spread through sex and sharing items such as razor blades or toothbrushes. Needles used for tattoos and body piercings can also spread the virus.  The virus doesn't always cause symptoms. But you may feel tired. And you may have a headache, sore muscles, nausea, and pain in the upper right belly. Other symptoms include yellowish skin and dark urine. Home treatment can help ease symptoms. And your doctor may prescribe antiviral medicine.  Long-term infection can lead to severe liver damage. So make sure to go to your follow-up appointments.  Follow-up care is a key part of your treatment and safety. Be sure to make and go to all appointments, and call your doctor if you are having problems. It's also a good idea to know your test results and keep a list of the medicines you take.  How can you care for yourself at home?  ?? Be safe with medicines. If your doctor prescribes antiviral medicine, take it exactly as prescribed. Call your doctor if you think you are having a problem with your medicine.  ?? Do not drink alcohol. Alcohol can damage the liver. Tell your doctor if you need help to quit. Counseling, support groups, and sometimes medicines can help you stay sober.  ?? Do not take drugs or herbal medicines. They can make liver problems worse.  ?? Make sure your doctor knows all of the medicines you take. Some medicines, such as acetaminophen (Tylenol), can make liver problems worse. Do not  take any new medicines unless your doctor tells you to. This includes over-the-counter medicines.  ?? Maintain a healthy lifestyle. Get plenty of exercise if you feel up to it. Eat a healthy diet.  ?? Drink plenty of fluids, enough so that your urine is light yellow or clear like water. If you have kidney, heart, or liver disease and have to limit fluids, talk with your doctor before you increase the amount of fluids you drink.  ?? Get the vaccines (if you have not already) to protect yourself from hepatitis A and hepatitis B, influenza, and pneumococcus.  ?? The infection can make you itch. Keep cool and stay out of the sun. Try to wear cotton clothing. Talk to your doctor about using over-the-counter medicines for itching. These include diphenhydramine (Benadryl) and chlorpheniramine (Chlor-Trimeton). Follow the instructions on the label.  ?? If you feel depressed, talk to your doctor about treatment. Many people who have long-term illnesses get depressed. Keep in mind that antiviral medicine can make depression worse.  To avoid spreading hepatitis C to others  ?? Tell the people that you live with or have sex with about your illness as soon as you can.  ?? Don't share needles to inject drugs. Don't share other equipment (such as cotton, spoons, and water) with others. Find out if a needle exchange program is available in your area, and use it. Get into a drug treatment program.  ?? Practice safer sex. Reduce your number   of sex partners if you have more than one. Unless you are in a long-term relationship in which neither partner has sex with anyone else, always use latex condoms when you have sex.  ?? Don't donate blood or blood products, organs, semen, or eggs (ova).  ?? Make sure that all equipment is sterilized if you get a tattoo, have your body pierced, or have acupuncture.  ?? Do not share your personal items. These include razors, toothbrushes, towels, and nail files.  ?? Tell your doctor, dentist, and anyone else who  may come in contact with your blood about your illness.  ?? Prevent others from coming in contact with your blood and other body fluids. Keep any cuts, scrapes, or blisters covered.  ?? Wash your hands???and any object that has come in contact with your blood???thoroughly with water and soap.  When should you call for help?  Call 911 anytime you think you may need emergency care. For example, call if:  ?? You passed out (lost consciousness).  ?? You have severe trouble breathing.  ?? You feel very confused and can't think clearly.  ?? You vomit blood or what looks like coffee grounds.  ?? You pass maroon or very bloody stools.  Call your doctor now or seek immediate medical care if:  ?? You have any trouble breathing.  ?? You have new bruises or blood spots under your skin.  ?? Your stools are black and tarlike or have streaks of blood.  ?? You have signs of needing more fluids. You have sunken eyes and a dry mouth, and you pass only a little dark urine.  Watch closely for changes in your health, and be sure to contact your doctor if:  ?? You have a nosebleed.  ?? Your gums bleed when you brush your teeth.   Where can you learn more?   Go to [email or web address]  Enter [phone] in the search box to learn more about "Hepatitis C: After Your Visit."   ?? 2006-2014 [employer]. Care instructions adapted under license by [employer] (which disclaims liability or warranty for this information). This care instruction is for use with your licensed healthcare professional. If you have questions about a medical condition or this instruction, always ask your healthcare professional. [employer] disclaims any warranty or liability for your use of this information.  Content Version: 10.2.346038; Current as of: June 12, 2012              Learning About High Blood Pressure  What is high blood pressure?     Blood pressure is a measure of how hard the blood pushes against the walls of your arteries. It's normal  for blood pressure to go up and down throughout the day, but if it stays up, you have high blood pressure. Another name for high blood pressure is hypertension.  Two numbers tell you your blood pressure. The first number is the systolic pressure. It shows how hard the blood pushes when your heart is pumping. The second number is the diastolic pressure. It shows how hard the blood pushes between heartbeats, when your heart is relaxed and filling with blood.  A blood pressure of less than 120/80 (say "120 over 80") is ideal for an adult. High blood pressure is 140/90 or higher. Many people fall into the category in between, called prehypertension. People with prehypertension need to make lifestyle changes to bring their blood pressure down and help prevent or delay high blood pressure.  What happens when  you have high blood pressure?  ?? Blood flows through your arteries with too much force. Over time, this damages the walls of your arteries. But you can't feel it. High blood pressure usually doesn't cause symptoms.  ?? Fat and calcium start to build up in your arteries. This buildup is called plaque. Plaque makes your arteries narrower and stiffer. Blood can't flow through them as easily.  ?? This lack of good blood flow starts to damage some of the organs in your body. This can lead to problems such as coronary artery disease and heart attack, heart failure, stroke, kidney failure, and eye damage.  How can you prevent high blood pressure?  ?? Stay at a healthy weight.  ?? Try to limit how much sodium you eat to less than 2,300 milligrams (mg) a day. And try to limit the sodium you eat to less than 1,500 mg a day if you are 51 or older, are black, or have high blood pressure, diabetes, or chronic kidney disease.  ?? Buy foods that are labeled "unsalted," "sodium-free," or "low-sodium." Foods labeled "reduced-sodium" and "light sodium" may still have too much sodium.  ?? Flavor your food with garlic, lemon juice, onion,  vinegar, herbs, and spices instead of salt. Do not use soy sauce, steak sauce, onion salt, garlic salt, mustard, or ketchup on your food.  ?? Use less salt (or none) when recipes call for it. You can often use half the salt a recipe calls for without losing flavor.  ?? Be physically active. Get at least 30 minutes of exercise on most days of the week. Walking is a good choice. You also may want to do other activities, such as running, swimming, cycling, or playing tennis or team sports.  ?? Limit alcohol to 2 drinks a day for men and 1 drink a day for women.  ?? Eat plenty of fruits, vegetables, and low-fat dairy products. Eat less saturated and total fats.  How is high blood pressure treated?  ?? Your doctor will suggest making lifestyle changes. For example, your doctor may ask you to eat healthy foods, quit smoking, lose extra weight, and be more active.  ?? If lifestyle changes don't help enough or your blood pressure is very high, you will have to take medicine every day.  Follow-up care is a key part of your treatment and safety. Be sure to make and go to all appointments, and call your doctor if you are having problems. It's also a good idea to know your test results and keep a list of the medicines you take.   Where can you learn more?   Go to [email or web address]  Enter P501 in the search box to learn more about "Learning About High Blood Pressure."   ?? 2006-2014 [employer]. Care instructions adapted under license by [employer] (which disclaims liability or warranty for this information). This care instruction is for use with your licensed healthcare professional. If you have questions about a medical condition or this instruction, always ask your healthcare professional. [employer] disclaims any warranty or liability for your use of this information.  Content Version: 10.2.346038; Current as of: March 20, 2012              Stopping Smoking: After Your Visit  Your Care  Instructions  Cigarette smokers crave the nicotine in cigarettes. Giving it up is much harder than simply changing a habit. Your body has to stop craving the nicotine. It is hard to quit, but you can  do it. There are many tools that people use to quit smoking. You may find that combining tools works best for you.  There are several steps to quitting. First you get ready to quit. Then you get support to help you. After that, you learn new skills and behaviors to become a nonsmoker. For many people, a necessary step is getting and using medicine.  Your doctor will help you set up the plan that best meets your needs. You may want to attend a smoking cessation program to help you quit smoking. When you choose a program, look for one that has proven success. Ask your doctor for ideas. You will greatly increase your chances of success if you take medicine as well as get counseling or join a cessation program.  Some of the changes you feel when you first quit tobacco are uncomfortable. Your body will miss the nicotine at first, and you may feel short-tempered and grumpy. You may have trouble sleeping or concentrating. Medicine can help you deal with these symptoms. You may struggle with changing your smoking habits and rituals. The last step is the tricky one: Be prepared for the smoking urge to continue for a time. This is a lot to deal with, but keep at it. You will feel better.  Follow-up care is a key part of your treatment and safety. Be sure to make and go to all appointments, and call your doctor if you are having problems. It???s also a good idea to know your test results and keep a list of the medicines you take.  How can you care for yourself at home?  ?? Ask your family, friends, and coworkers for support. You have a better chance of quitting if you have help and support.  ?? Join a support group, such as Nicotine Anonymous, for people who are trying to quit smoking.  ?? Consider signing up for a smoking cessation  program, such as the American [employer] Association's Freedom from Smoking program.  ?? Set a quit date. Pick your date carefully so that it is not right in the middle of a big deadline or stressful time. Once you quit, do not even take a puff. Get rid of all ashtrays and lighters after your last cigarette. Clean your house and your clothes so that they do not smell of smoke.  ?? Learn how to be a nonsmoker. Think about ways you can avoid those things that make you reach for a cigarette.  ?? Avoid situations that put you at greatest risk for smoking. For some people, it is hard to have a drink with friends without smoking. For others, they might skip a coffee break with coworkers who smoke.  ?? Change your daily routine. Take a different route to work or eat a meal in a different place.  ?? Cut down on stress. Calm yourself or release tension by doing an activity you enjoy, such as reading a book, taking a hot bath, or gardening.  ?? Talk to your doctor or pharmacist about nicotine replacement therapy, which replaces the nicotine in your body. You still get nicotine but you do not use tobacco. Nicotine replacement products help you slowly reduce the amount of nicotine you need. These products come in several forms, many of them available over-the-counter:  ?? Nicotine patches  ?? Nicotine gum and lozenges  ?? Nicotine inhaler  ?? Ask your doctor about bupropion (Wellbutrin) or varenicline (Chantix), which are prescription medicines. They do not contain nicotine. They help you by  reducing withdrawal symptoms, such as stress and anxiety.  ?? Some people find hypnosis, acupuncture, and massage helpful for ending the smoking habit.  ?? Eat a healthy diet and get regular exercise. Having healthy habits will help your body move past its craving for nicotine.  ?? Be prepared to keep trying. Most people are not successful the first few times they try to quit. Do not get mad at yourself if you smoke again. Make a list of things you learned  and think about when you want to try again, such as next week, next month, or next year.   Where can you learn more?   Go to [email or web address]  Enter Y522 in the search box to learn more about "Stopping Smoking: After Your Visit."   ?? 2006-2014 [employer]. Care instructions adapted under license by [employer] (which disclaims liability or warranty for this information). This care instruction is for use with your licensed healthcare professional. If you have questions about a medical condition or this instruction, always ask your healthcare professional. [employer] disclaims any warranty or liability for your use of this information.  Content Version: 10.2.346038; Current as of: August 15, 2011              Stroke: After Your Visit  Your Care Instructions  You have had a stroke. This means that the blood flow to a part of your brain was blocked for some time, which damages the nerve cells in that part of the brain. The part of your body controlled by that part of your brain may not function properly now.  The brain is an amazing organ that can heal itself to some degree. The stroke you had damaged part of your brain. But other parts of your brain may take over in some way for the damaged areas. You have already started this process.  Your doctor will talk with you about what you can do to prevent another stroke. High blood pressure, high cholesterol, and diabetes are all risk factors for stroke. If you have any of these conditions, work with your doctor to make sure they are under control. Other risk factors for stroke include being overweight, smoking, and not getting regular exercise.  Going home may be hard for you and your family. The more you can try to do for yourself, the better. Remember to take each day one at a time.  Follow-up care is a key part of your treatment and safety. Be sure to make and go to all appointments, and call your doctor if you are having  problems. It's also a good idea to know your test results and keep a list of the medicines you take.  How can you care for yourself at home?  ?? Enter a stroke rehabilitation (rehab) program, if your doctor recommends it. Physical, speech, and occupational therapies can help you manage bathing, dressing, eating, and other basics of daily living.  ?? Do not drive until your doctor says it is okay.  ?? It is normal to feel sad or depressed after a stroke. If these feelings last, talk to your doctor.  ?? If you are having problems with urine leakage, go to the bathroom at regular times, including when you first wake up and at bedtime. Also, limit fluids after dinner.  ?? If you are constipated, drink plenty of fluids, enough so that your urine is light yellow or clear like water. If you have kidney, heart, or liver disease and have to limit fluids,  talk with your doctor before you increase the amount of fluids you drink. Set up a regular time for using the toilet. If you continue to have constipation, your doctor may suggest using a bulking agent, such as Metamucil, or a stool softener, laxative, or enema.  Medicines  ?? Take your medicines exactly as prescribed. Call your doctor if you think you are having a problem with your medicine. You may be taking several medicines. ACE (angiotensin-converting enzyme) inhibitors, angiotensin II receptor blockers (ARBs), beta-blockers, diuretics (water pills), and calcium channel blockers control your blood pressure. Statins help lower cholesterol. Your doctor may also prescribe medicines for depression, pain, sleep problems, anxiety, or agitation.  ?? If your doctor has given you a blood thinner to prevent another stroke, be sure you get instructions about how to take your medicine safely. Blood thinners can cause serious bleeding problems.  ?? Do not take any over-the-counter medicines or herbal products without talking to your doctor first.  ?? If you take birth control pills or  hormone therapy, talk to your doctor about whether they are right for you.  For family members and caregivers  ?? Make the home safe. Set up a room so that your loved one does not have to climb stairs. Be sure the bathroom is on the same floor. Move throw rugs and furniture that could cause falls. Make sure that the lighting is good. Put grab bars and seats in tubs and showers.  ?? Find out what your loved one can do and what he or she needs help with. Try not to do things for your loved one that your loved one can do on his or her own. Help him or her learn and practice new skills.  ?? Visit and talk with your loved one often. Try doing activities together that you both enjoy, such as playing cards or board games. Keep in touch with your loved one's friends as much as you can. Encourage them to visit.  ?? Take care of yourself. Do not try to do everything yourself. Ask other family members to help. Eat well, get enough rest, and take time to do things that you enjoy. Keep up with your own doctor visits, and make sure to take your medicines regularly. Get out of the house as much as you can. Join a local support group. Find out if you qualify for home health care visits to help with rehab or for adult day care.  When should you call for help?  Call 911 anytime you think you may need emergency care. For example, call if:  ?? You have signs of another stroke. These may include:  ?? Sudden numbness, paralysis, or weakness in your face, arm, or leg, especially on only one side of your body.  ?? New problems with walking or balance.  ?? Sudden vision changes.  ?? Drooling or slurred speech.  ?? New problems speaking or understanding simple statements, or you feel confused.  ?? A sudden, severe headache that is different from past headaches.  Call 911 even if these symptoms go away in a few minutes.  Call your doctor now or seek immediate medical care if:  ?? You have new symptoms that may be related to your stroke, such as falls or  trouble swallowing.  Watch closely for changes in your health, and be sure to contact your doctor if you have any problems.   Where can you learn more?   Go to [email or web address]  Enter C294 in the  search box to learn more about "Stroke: After Your Visit."   ?? 2006-2014 [employer]. Care instructions adapted under license by [employer] (which disclaims liability or warranty for this information). This care instruction is for use with your licensed healthcare professional. If you have questions about a medical condition or this instruction, always ask your healthcare professional. [employer] disclaims any warranty or liability for your use of this information.  Content Version: 10.2.346038; Current as of: May 15, 2012

## 2013-03-18 NOTE — Progress Notes (Signed)
HISTORY OF PRESENT ILLNESS  Wanda Doyle is a 58 y.o. BLACK OR [place] female.  Chief Complaint   Patient presents with   ??? Establish Care     Pt have weakness on the left side due to a stroke that happen over a year ago, also have back pain. Previous provider Dr. Metro Kunghandi   ??? Weight Loss     pt is concerned about constant weight loss   ??? Nicotine Dependence       HPI Comments: Note: pt pleasant and cooperative, but an extremely poor historian.  Markedly dysarthric from her previous Lt CVA. Pt with multiple medical issues. Old records unavailable at this time for review.    Establish Care  The history is provided by the patient (Pt seeking to establish new PCP.). Pertinent negatives include no chest pain, no abdominal pain, no headaches and no shortness of breath.   Weight Loss  The history is provided by the patient (Pt with c/o continued weight loss, poor appetite). The problem has been gradually worsening. Pertinent negatives include no chest pain, no abdominal pain, no headaches and no shortness of breath.   Nicotine Dependence  The history is provided by the patient (Relates she continues to smoke sevral cigarettes daily). The problem has not changed since onset.Pertinent negatives include no chest pain, no abdominal pain, no headaches and no shortness of breath.     Past Medical History   Diagnosis Date   ??? Seizures    ??? Hypertension    ??? Stroke      Current Outpatient Prescriptions   Medication Sig Dispense Refill   ??? megestrol (MEGACE) 40 mg tablet Take 40 mg by mouth two (2) times a day.       ??? simvastatin (ZOCOR) 40 mg tablet Take 40 mg by mouth nightly.       ??? omeprazole (PRILOSEC) 20 mg capsule Take 20 mg by mouth daily.       ??? hydrochlorothiazide (HYDRODIURIL) 25 mg tablet Take 25 mg by mouth daily.       ??? levETIRAcetam (KEPPRA) 500 mg tablet Take  by mouth two (2) times a day.       ??? potassium chloride (KLOR-CON) 20 mEq packet Take 20 mEq by mouth daily.       ??? aspirin 81 mg chewable  tablet 162 mg.       ??? acetaminophen (TYLENOL) 325 mg tablet 650 mg.       ??? albuterol (PROVENTIL HFA, VENTOLIN HFA, PROAIR HFA) 90 mcg/actuation inhaler 2 Puffs.         Allergies   Allergen Reactions   ??? Penicillins Nausea and Vomiting       Review of Systems   Constitutional: Positive for weight loss and malaise/fatigue. Negative for fever, chills and diaphoresis.   HENT: Negative for congestion, hearing loss, sore throat and tinnitus.    Eyes: Negative for blurred vision, double vision and pain.   Respiratory: Negative for cough, hemoptysis, sputum production, shortness of breath and wheezing.    Cardiovascular: Negative for chest pain, palpitations, orthopnea and PND.   Gastrointestinal: Positive for heartburn. Negative for nausea, vomiting, abdominal pain, diarrhea, constipation, blood in stool and melena.   Genitourinary: Negative for dysuria, urgency and frequency.   Musculoskeletal: Positive for back pain and neck pain. Negative for falls.        CHRONIC PAIN IN HER NECK AND BACK FOR WHICH SHE IS FOLLOWED BY PAIN MANAGEMENT   Skin: Negative for itching  and rash.   Neurological: Positive for speech change, focal weakness, seizures and weakness (Chronic Rt hemiparesis since CVA). Negative for dizziness, tremors, loss of consciousness and headaches.   Endo/Heme/Allergies: Negative for polydipsia. Does not bruise/bleed easily.   Psychiatric/Behavioral: Positive for memory loss. Negative for depression, suicidal ideas, hallucinations and substance abuse. The patient is not nervous/anxious and does not have insomnia.        Physical Exam   Constitutional: She is oriented to person, place, and time. She appears distressed.   Frail woman who appears older than stated age.   HENT:   Head: Normocephalic and atraumatic.   Mouth/Throat: No oropharyngeal exudate.   Eyes: Conjunctivae and EOM are normal. Pupils are equal, round, and reactive to light. No scleral icterus.   Neck: Neck supple. No tracheal deviation  present. No thyromegaly present.   Cardiovascular: Normal rate and regular rhythm.  Exam reveals no gallop and no friction rub.    No murmur heard.  Pulmonary/Chest: No respiratory distress. She has no wheezes. She has no rales.   Musculoskeletal: She exhibits no edema or tenderness.   Lymphadenopathy:     She has no cervical adenopathy.   Neurological: She is alert and oriented to person, place, and time.   Skin: She is not diaphoretic.   Psychiatric: She has a normal mood and affect. Her behavior is normal. Thought content normal.       ASSESSMENT and PLAN    ICD-9-CM    1. Chronic back pain 724.5     338.29     Pt followed at [facility]( Dr. Geanie Logan)   2. Hemiparesis affecting right side as late effect of cerebrovascular accident 438.20    3. Hepatitis C 070.70    4. Tobacco use disorder 305.1 VITAMIN D, 1, 25 DIHYDROXY   5. Hyperlipidemia 272.4 simvastatin (ZOCOR) 40 mg tablet     METABOLIC PANEL, COMPREHENSIVE     LIPID PANEL   6. Weight loss, unintentional 783.21 megestrol (MEGACE) 40 mg tablet     METABOLIC PANEL, COMPREHENSIVE     CBC WITH AUTOMATED DIFF     T4, FREE     TSH, 3RD GENERATION     VITAMIN D, 1, 25 DIHYDROXY   7. Cerebral artery occlusion with cerebral infarction 434.91 aspirin 81 mg chewable tablet   8. Unspecified asthma 493.90    9. HTN (hypertension) 401.9 hydrochlorothiazide (HYDRODIURIL) 25 mg tablet     potassium chloride (KLOR-CON) 20 mEq packet   10. GERD (gastroesophageal reflux disease) 530.81 omeprazole (PRILOSEC) 20 mg capsule   11. Seizure disorder 345.90 levETIRAcetam (KEPPRA) 500 mg tablet     Wanda Doyle was seen today for establish care, weight loss and nicotine dependence.    Diagnoses and associated orders for this visit:    Chronic back pain  Comments: Pt followed at [facility]( Dr. Geanie Logan)    Hemiparesis affecting right side as late effect of cerebrovascular accident    Hepatitis C    Tobacco use disorder  - VITAMIN D, 1, 25  DIHYDROXY    Hyperlipidemia  - METABOLIC PANEL, COMPREHENSIVE  - LIPID PANEL    Weight loss, unintentional  - METABOLIC PANEL, COMPREHENSIVE  - CBC WITH AUTOMATED DIFF  - T4, FREE  - TSH, 3RD GENERATION  - VITAMIN D, 1, 25 DIHYDROXY    Cerebral artery occlusion with cerebral infarction    Unspecified asthma    HTN (hypertension)    GERD (gastroesophageal reflux disease)  Seizure disorder        Follow-up Disposition:  Return in about 4 weeks (around 04/15/2013) for RE-EVALUATION.  lab results and schedule of future lab studies reviewed with patient  very strongly urged to quit smoking to reduce cardiovascular risk    Reviewed with patient the treatment plan, goals of treatment plan, and limitations of treatment plan.      Patient verbalized understanding and is in agreement with treatment plan as outlined above.  All questions answered.

## 2013-03-20 LAB — METABOLIC PANEL, COMPREHENSIVE
A-G Ratio: 1.5 (ref 1.1–2.5)
ALT (SGPT): 18 IU/L (ref 0–32)
AST (SGOT): 26 IU/L (ref 0–40)
Albumin: 4.7 g/dL (ref 3.6–4.8)
Alk. phosphatase: 47 IU/L (ref 39–117)
BUN/Creatinine ratio: 12 (ref 11–26)
BUN: 12 mg/dL (ref 8–27)
Bilirubin, total: 1.2 mg/dL (ref 0.0–1.2)
CO2: 19 mmol/L (ref 18–29)
Calcium: 10 mg/dL (ref 8.7–10.3)
Chloride: 105 mmol/L (ref 97–108)
Creatinine: 0.97 mg/dL (ref 0.57–1.00)
GFR est AA: 73 mL/min/{1.73_m2} (ref >59)
GFR est non-AA: 64 mL/min/{1.73_m2} (ref >59)
GLOBULIN, TOTAL: 3.1 g/dL (ref 1.5–4.5)
Glucose: 89 mg/dL (ref 65–99)
Potassium: 4.9 mmol/L (ref 3.5–5.2)
Protein, total: 7.8 g/dL (ref 6.0–8.5)
Sodium: 145 mmol/L — ABNORMAL HIGH (ref 134–144)

## 2013-03-20 LAB — CBC WITH AUTOMATED DIFF
ABS. BASOPHILS: 0 10*3/uL (ref 0.0–0.2)
ABS. EOSINOPHILS: 0.1 10*3/uL (ref 0.0–0.4)
ABS. IMM. GRANS.: 0 10*3/uL (ref 0.0–0.1)
ABS. MONOCYTES: 0.4 10*3/uL (ref 0.1–0.9)
ABS. NEUTROPHILS: 3.1 10*3/uL (ref 1.4–7.0)
Abs Lymphocytes: 2.4 10*3/uL (ref 0.7–3.1)
BASOPHILS: 0 %
EOSINOPHILS: 2 %
HCT: 36.8 % (ref 34.0–46.6)
HGB: 12.2 g/dL (ref 11.1–15.9)
IMMATURE GRANULOCYTES: 0 %
Lymphocytes: 39 %
MCH: 29.8 pg (ref 26.6–33.0)
MCHC: 33.2 g/dL (ref 31.5–35.7)
MCV: 90 fL (ref 79–97)
MONOCYTES: 7 %
NEUTROPHILS: 52 %
PLATELET: 282 10*3/uL (ref 150–379)
RBC: 4.1 x10E6/uL (ref 3.77–5.28)
RDW: 14.3 % (ref 12.3–15.4)
WBC: 6 10*3/uL (ref 3.4–10.8)

## 2013-03-20 LAB — LIPID PANEL
Cholesterol, total: 171 mg/dL (ref 100–199)
HDL Cholesterol: 87 mg/dL (ref >39)
LDL, calculated: 72 mg/dL (ref 0–99)
Triglyceride: 58 mg/dL (ref 0–149)
VLDL, calculated: 12 mg/dL (ref 5–40)

## 2013-03-20 LAB — TSH 3RD GENERATION: TSH: 1.38 u[IU]/mL (ref 0.450–4.500)

## 2013-03-20 LAB — VITAMIN D, 1, 25 DIHYDROXY: Calcitriol (Vit D 1, 25 di-OH): 37.1 pg/mL (ref 10.0–75.0)

## 2013-03-20 LAB — T4, FREE: T4, Free: 1.06 ng/dL (ref 0.82–1.77)

## 2013-03-21 ENCOUNTER — Encounter

## 2013-03-21 MED ORDER — MEGESTROL 400 MG/10 ML ORAL SUSP
400 mg/10 mL (10 mL) | Freq: Two times a day (BID) | ORAL | Status: DC
Start: 2013-03-21 — End: 2013-06-23

## 2013-03-21 NOTE — Telephone Encounter (Signed)
Notified pt that she should increase her water intake. Encouraged pt to pick up medication from pharmacy. Pt repeated several times that she was unable to drink a lot of water and she would get someone to pick up her medications. Pt did not verbalize understanding. After several attempts pt continued not to verbalize understanding. Appt made for May 5 at 10am.

## 2013-03-21 NOTE — Telephone Encounter (Signed)
Please inform Pt labs good except elevated NA+. NEEDS TO INCREASE FREE WATER INTAKE. Also need to increase Megace dose, new Rx sent to pharmacy.

## 2013-03-26 ENCOUNTER — Encounter

## 2013-03-26 NOTE — Telephone Encounter (Signed)
Pt requested a referral to speech therapy through her insurance. Insurance company would like a order fax to [phone]. After order sent, they will be able to recommend pt to speech therapy.

## 2013-04-09 ENCOUNTER — Encounter

## 2013-04-09 NOTE — Telephone Encounter (Signed)
According to chart pt is being followed [facility].  Per Pt, she is no longer seeing [facility], pt states they gave her a 30 day supply of pain medication  and told her see needs to find another physician. Pt has a f/u appt scheduled for 05/12/13. Unable to get clear responses from patient via telephone. Please see message below.

## 2013-04-09 NOTE — Telephone Encounter (Signed)
Pt called stating she had discussed with Dr. Yetta BarreJones a referral regarding her back but has heard nothing further. She also stated she is in a lot of pain & out of medication.

## 2013-04-09 NOTE — Telephone Encounter (Signed)
Will send referral to pain mangement

## 2013-04-09 NOTE — Telephone Encounter (Signed)
Called [facility] and they was unable to give accurate information on the patient call. Contacted pt with not answer, [facility] and they was unable to give accurate information on respiratory supplies needed. Pt is schedule to be seen on 05/12/13, another attempt will be made to contact pt concerning what is needed.

## 2013-04-09 NOTE — Telephone Encounter (Signed)
Wanda Doyle with [facility] called stating she received a call from the patient stating she is unable to get her respiratory supplies from her current supplier because they do not accept her insurance. [facility] [phone]) does but they require a RX. Please contact ASAP.

## 2013-04-15 NOTE — Telephone Encounter (Signed)
Unable to reach pt.

## 2013-04-17 NOTE — Telephone Encounter (Signed)
Patient stated pain was a 8 out 10. I advised patient to go to er or make an appt. Patient stated that she had no transportation to get to doctor office so I advised patient to go to er. Patient said ok.

## 2013-04-17 NOTE — Telephone Encounter (Signed)
Pt will not be able to get an appointment with pain management for 3-5 weeks & states she is in a lot of pain. Please call her to discuss. [phone]    room 116

## 2013-04-22 ENCOUNTER — Encounter

## 2013-04-22 MED ORDER — HYDROCODONE-ACETAMINOPHEN 5 MG-325 MG TAB
5-325 mg | ORAL_TABLET | Freq: Three times a day (TID) | ORAL | Status: DC | PRN
Start: 2013-04-22 — End: 2013-05-07

## 2013-04-22 NOTE — Telephone Encounter (Signed)
ERROR

## 2013-04-22 NOTE — Telephone Encounter (Signed)
Pts friend Casimiro NeedleMichael called, pt requesting something for her back pain.  Pt had a stroke recently so she has trouble speaking. Please call them at room 116 at number listed above.  Pts referral for pain management was submitted on 04/09/13.  They request 4-6 weeks to process and review paperwork before they accept new pts.  Dr. Maggie FontHolzer.

## 2013-04-22 NOTE — Telephone Encounter (Signed)
Pt called back.

## 2013-04-22 NOTE — Telephone Encounter (Signed)
Please refer pt to a speech therapist. Pt insurance was suppose to notify pt of a speech therapist but per pt, they have not. Pt is also request pain medication. Pt referred to Dr. Maggie FontHolzer with a 4-6 week process. Pt verbalized understanding. Pt was offered and earlier appt but declined due to transportation issue. Pt was notified that if rx is written, she would have to come to office to pick up prescription.

## 2013-04-30 NOTE — Telephone Encounter (Signed)
Wanda EvensMichael Doyle [phone] room 116) called stating pt is in a lot of pain. She was referred to pain management but it could be 4-6 weeks before she can get an appt & it looks like her Norco RX may be out. Pt had a stroke & has trouble communicating. Please call him ASAP to discuss.

## 2013-04-30 NOTE — Telephone Encounter (Signed)
Spoke with Lemmie EvensMichael Law concerning pt. Pt c/o swelling and pain. Explained to Mr. Law that a rx is ready for patient to pick up. Due to transportation issue pt cannot come pick up her prescription. Pt was offered a early appointment and encouraged pt to go to ER due to current complaints. Ms. Gayla DossJoyner and Mr. Law verbalized understanding and is willing to go to the er today (04/30/13). Encourage them to call back with an update concerning pt on 05/01/13. Pt needs a new pain management referral completed due to [facility]. Pt may have an issue with insurance being accepted at other pain management physicians.

## 2013-05-01 ENCOUNTER — Encounter

## 2013-05-07 MED ORDER — METHOCARBAMOL 500 MG TAB
500 mg | ORAL | Status: AC
Start: 2013-05-07 — End: 2013-05-07
  Administered 2013-05-07: 14:00:00 via ORAL

## 2013-05-07 MED ORDER — HYDROCODONE-ACETAMINOPHEN 5 MG-325 MG TAB
5-325 mg | ORAL_TABLET | ORAL | Status: DC | PRN
Start: 2013-05-07 — End: 2013-05-12

## 2013-05-07 MED ORDER — HYDROCODONE-ACETAMINOPHEN 5 MG-325 MG TAB
5-325 mg | ORAL | Status: AC
Start: 2013-05-07 — End: 2013-05-07
  Administered 2013-05-07: 14:00:00 via ORAL

## 2013-05-07 MED ORDER — METHOCARBAMOL 500 MG TAB
500 mg | ORAL_TABLET | Freq: Four times a day (QID) | ORAL | Status: DC
Start: 2013-05-07 — End: 2013-05-21

## 2013-05-07 MED FILL — HYDROCODONE-ACETAMINOPHEN 5 MG-325 MG TAB: 5-325 mg | ORAL | Qty: 1

## 2013-05-07 MED FILL — METHOCARBAMOL 500 MG TAB: 500 mg | ORAL | Qty: 1

## 2013-05-07 NOTE — ED Notes (Signed)
Patient complains of back pain all over and it goes down her leg.

## 2013-05-07 NOTE — ED Notes (Signed)
I have reviewed discharge instructions with the patient.  The patient verbalized understanding. All belongings with patient. All questions answered.

## 2013-05-07 NOTE — ED Notes (Addendum)
Pt c/o lower chronic back pain that radiates left leg.  Pt denies loss of bowel and bladder function.  Pt is able to ambulate with an unsteady gait.

## 2013-05-07 NOTE — ED Provider Notes (Signed)
Patient is a 58 y.o. female presenting with back pain.   Back Pain        58 yo female to the ER with c/o Acute on chronic lower back pain x 1 week, pt repots chronic lower back pain for years, pt reports that she has been doing more work around the house since the weather has gotten warm, pt reports that her pain today is the same pain that she normally has in her back, pt reports that she is waiting for Pain management appt next week, pt reports that her PCP Dr Yetta BarreJones referred her to pain management, pt reports that she has prescription for norco waiting to be picked up at PCP's office but has been able to find a ride, pt denies any fever, nausea, vomiting, diarrhea, CP, SOB, HA, dizziness, ABD pain, weakness, paresthesias, injury/surgery/trauma, or any other concerns, pt reports that her pain is exacerbated with movement, improved with rest, pt didn't drive to the ER        Past Medical History   Diagnosis Date   ??? Seizures (HCC)    ??? Hypertension    ??? Stroke [facility](HCC)         Past Surgical History   Procedure Laterality Date   ??? Hx hysterectomy           Family History   Problem Relation Age of Onset   ??? Family history unknown: Yes        History     Social History   ??? Marital Status: SINGLE     Spouse Name: N/A     Number of Children: N/A   ??? Years of Education: N/A     Occupational History   ??? Not on file.     Social History Main Topics   ??? Smoking status: Light Tobacco Smoker     Types: Cigarettes   ??? Smokeless tobacco: Never Used   ??? Alcohol Use: No      Comment: drinks a beer every once in a while   ??? Drug Use: No   ??? Sexual Activity: Not on file     Other Topics Concern   ??? Not on file     Social History Narrative                  ALLERGIES: Penicillins      Review of Systems   Musculoskeletal: Positive for back pain.     ROS   Constitutional:?? Denies malaise, fever, chills.   Head:?? Denies injury.   Face:?? Denies injury or pain.   ENMT:?? Denies sore throat.   Neck:?? Denies injury or pain.   Chest:?? Denies  injury.   Cardiac:?? Denies chest pain or palpitations.   Respiratory:?? Denies cough, wheezing, difficulty breathing, shortness of breath.   GI/ABD:?? Denies injury, pain, distention, nausea, vomiting, diarrhea.   GU:?? Denies injury, pain, dysuria or urgency.   Back:?? see HPI  Pelvis:?? Denies injury or pain.   Extremity/MS:?? Denies injury or pain.   Neuro:?? Denies headache, LOC, dizziness, neurologic symptoms/ deficits/ paresthesias.   Skin: Denies injury, rash, itching or skin changes.       Filed Vitals:    05/07/13 0954 05/07/13 1019   BP: 129/89 138/78   Pulse: 102 88   Temp: 97.8 ??F (36.6 ??C)    Resp: 18 18   Height: 5\' 5"  (1.651 m)    Weight: 45.36 kg (100 lb)    SpO2: 96% 97%  Physical Exam   Constitutional: She is oriented to person, place, and time. She appears well-developed and well-nourished.   HENT:   Head: Normocephalic and atraumatic.   Right Ear: External ear normal.   Left Ear: External ear normal.   Nose: Nose normal.   Mouth/Throat: Oropharynx is clear and moist.   +uvula midline, +able to handle oral secretions without difficulty, +airway patent   Eyes: Conjunctivae and EOM are normal. Pupils are equal, round, and reactive to light.   Neck: Normal range of motion. Neck supple.   Non-tender   Cardiovascular: Normal rate, regular rhythm and normal heart sounds.    Pulmonary/Chest: Effort normal and breath sounds normal. No respiratory distress. She has no wheezes. She has no rales.   Lungs CTA bilaterally, no wheezing, rhonchi or rales   Abdominal: Soft. Bowel sounds are normal. She exhibits no distension. There is no tenderness. There is no rebound and no guarding.   Positive BS noted, ABD soft with no tenderness, no rebound, guarding or rigidity noted   Musculoskeletal:   BACK FROM in back with no midline spinal tenderness, +Bilateral lumbar paraspinal muscle tenderness, no swelling, bruising, ecchymosis, deformity or step off noted    EXT FROM in BUE and BLE with no tenderness, swelling,  bruising, ecchymosis, deformity, dislocation or open wound noted, nv intact, no sensory deficits, motor 5/5, normal gait   Neurological: She is alert and oriented to person, place, and time.   No  Neuro deficits, no nystagmus   Skin: Skin is warm and dry.   Psychiatric: She has a normal mood and affect. Her behavior is normal.   Nursing note and vitals reviewed.       MDM  Number of Diagnoses or Management Options  Diagnosis management comments: DDX: Sciatica, Lumbar radiculopathy, Back Sprain, Back Strain, DJD, HNP, Epidural abscess, Cauda Equina Syndrome, Contusion, Pyelonephritis, Renal Colic, Rib Fracture, Rib Contusion, Costochondritis, Pleurisy, Pleurodynia, Pneumothorax, Thoracic Aneurysm, Abdominal Aortic Aneurysm, Nephro-ureteral Calculus, Acute Myocardial Infarction.     Plan pt presented  to the ER with c/o Acute on chronic lower back pain x 1 week, pt repots chronic lower back pain for years, pt reports that she has been doing more work around the house since the weather has gotten warm, pt reports that her pain today is the same pain that she normally has in her back, pt reports that she is waiting for Pain management appt next week, pt reports that her PCP Dr Yetta BarreJones referred her to pain management, pt reports that she has prescription for norco waiting to be picked up at PCP's office but has been able to find a ride, no injury/surgery/trauma no weakness, paresthesias incontinence, pt is able to ambulate around the ER without difficulty, no emergent imaging needed, will discharge home with norco, robaxin, and outpt f/u       Amount and/or Complexity of Data Reviewed  Decide to obtain previous medical records or to obtain history from someone other than the patient: yes  Review and summarize past medical records: yes    Risk of Complications, Morbidity, and/or Mortality  Presenting problems: low  Diagnostic procedures: low  Management options: low    Patient Progress  Patient progress: stable       Procedures    Patient was counseled on risks and long term effects of smoking, Patient was advised of importance of smoking cessation, Patient verbally acknowledged understanding the risks of smoking and benefits of smoking cessation.     Progress Note  Patient is improved, resting quietly and comfortably, The patient will be discharged home.  The patient was reassured that these symptoms do not appear to represent a serious or life threatening condition at this time.  Warning signs of worsening condition were discussed and understood by the patient.  Based on patient's age, coexisting illness, exam, and the results of this ED evaluation, the decision to treat as an outpatient was made.  Based on the information available at time of discharge, acute pathology requiring immediate intervention was deemed relative unlikely.  While it is impossible to completely exclude the possibility of underlying serious disease or worsening of condition, I feel the relative likelihood is extremely low.  I discussed this uncertainty with the patient, who understood ED evaluation and treatment and felt comfortable with the outpatient treatment plan.  All questions regarding care, test results, and follow up were answered.  The patient is stable and appropriate to discharge.  They understand that they should return to the emergency department for any new or worsening symptoms.  I stressed the importance of follow up for repeat assessment and possibly further evaluation/treatment.      Zella Richer, PA  May 07, 2013  10:20 AM

## 2013-05-12 MED ORDER — ALBUTEROL SULFATE 2.5 MG/0.5 ML NEB SOLUTION
2.5 mg/0.5 mL | RESPIRATORY_TRACT | Status: DC | PRN
Start: 2013-05-12 — End: 2014-04-21

## 2013-05-12 MED ORDER — HYDROCODONE-ACETAMINOPHEN 5 MG-325 MG TAB
5-325 mg | ORAL_TABLET | ORAL | Status: DC | PRN
Start: 2013-05-12 — End: 2013-05-22

## 2013-05-12 MED ORDER — ALBUTEROL SULFATE HFA 90 MCG/ACTUATION AEROSOL INHALER
90 mcg/actuation | Freq: Four times a day (QID) | RESPIRATORY_TRACT | Status: AC | PRN
Start: 2013-05-12 — End: ?

## 2013-05-12 NOTE — Progress Notes (Signed)
Internal Medicine  Progress Note  Today's Date:  05/12/2013   Patient:  Wanda Doyle  Patient DOB:  [birth year]    Subjective:     Chief Complaint   Patient presents with   ??? Hypertension     follow up   ??? Seizure     follow up        HPI: Wanda Doyle is a 58 y.o. African American female who presents for scheduled f/u. Pt requesting refill of medications. States speech and pain management appointments are pending. Continues to have LBP. States megace has helped her appetite.    ROS:   General: negative for - chills, fatigue, fever, intentional weight gain.  Resp: negative for - cough, sputum production, hemoptysis, shortness of breath, or wheezing  CV: negative for - chest pain, edema or palpitations  GI: negative for - abdominal pain, change in bowel habits, constipation, diarrhea or nausea/vomiting  GU: negative for - dysuria,frequency, urgency, hematuria, incontinence  Neuro: negative for - confusion, seizures or weakness  Psych: negative for - anxiety, depression, irritability or mood swings,suicidal, or homicidal ideation      Past Medical History   Diagnosis Date   ??? Seizures ([place])    ??? Hypertension    ??? Stroke [facility])      Past Surgical History   Procedure Laterality Date   ??? Hx hysterectomy           Current Outpatient Meds and Allergies     Current Outpatient Prescriptions   Medication Sig Dispense Refill   ??? albuterol (PROVENTIL HFA, VENTOLIN HFA, PROAIR HFA) 90 mcg/actuation inhaler Take 2 Puffs by inhalation every six (6) hours as needed for Wheezing.  1 Inhaler  6   ??? HYDROcodone-acetaminophen (NORCO) 5-325 mg per tablet Take 1 Tab by mouth every four (4) hours as needed for Pain. Max Daily Amount: 6 Tabs.  12 Tab  0   ??? albuterol sulfate (PROVENTIL;VENTOLIN) 2.5 mg/0.5 mL nebu nebulizer solution 0.5 mL by Nebulization route every four (4) hours as needed for Wheezing.  20 mL  11   ??? methocarbamol (ROBAXIN) 500 mg tablet Take 1 Tab by mouth four (4) times daily.  20 Tab  0   ??? simvastatin (ZOCOR)  40 mg tablet Take 40 mg by mouth nightly.       ??? omeprazole (PRILOSEC) 20 mg capsule Take 20 mg by mouth daily.       ??? hydrochlorothiazide (HYDRODIURIL) 25 mg tablet Take 25 mg by mouth daily.       ??? levETIRAcetam (KEPPRA) 500 mg tablet Take  by mouth two (2) times a day.       ??? potassium chloride (KLOR-CON) 20 mEq packet Take 20 mEq by mouth daily.       ??? aspirin 81 mg chewable tablet 162 mg.       ??? megestrol (MEGACE) 400 mg/10 mL (10 mL) suspension Take 10 mL by mouth two (2) times a day.  600 mL  3         Allergies   Allergen Reactions   ??? Penicillins Nausea and Vomiting          Objective:       BP Readings from Last 3 Encounters:   05/12/13 125/78   05/07/13 138/78   03/18/13 116/76     VS:  BP 125/78   Pulse 80   Temp(Src) 98.3 ??F (36.8 ??C) (Oral)   Resp 18   Ht 5' 5"  (1.651 m)  Wt 103 lb (46.72 kg)   BMI 17.14 kg/m2   SpO2 97%    Body mass index is 17.14 kg/(m^2).  General:   W/D, W/N, well-groomed,  pleasant, in no acute distress.      Head:  NCAT,                                  Eyes:             PERRLA,EOMI, sclera clear, conjunctiva pink.  Neck:              Supple with normal ROM for age, no adenopathy, thyromegaly, JVD, or bruits.  Cardiovasc:   Regular rate and rhythm, no murmurs, no rubs, no gallops.  Pulmonary:    Normal respiratory effort, good air movement, no wheezing, rales, rhonchi.    Abdomen:      Soft, nontender, nondistended, no HSM, nl bowel sounds.  Extremities:   No clubbing,cyanosis, edema.  Neuro:   A&Ox3, markedly dysarthric, appropriate.     Results for orders placed in visit on 56/43/32   METABOLIC PANEL, COMPREHENSIVE       Result Value Ref Range    Glucose 89  65 - 99 mg/dL    BUN 12  8 - 27 mg/dL    Creatinine 0.97  0.57 - 1.00 mg/dL    GFR est non-AA 64  >59 mL/min/1.73    GFR est AA 73  >59 mL/min/1.73    BUN/Creatinine ratio 12  11 - 26    Sodium 145 (*) 134 - 144 mmol/L    Potassium 4.9  3.5 - 5.2 mmol/L    Chloride 105  97 - 108 mmol/L    CO2 19  18 - 29 mmol/L     Calcium 10.0  8.7 - 10.3 mg/dL    Protein, total 7.8  6.0 - 8.5 g/dL    Albumin 4.7  3.6 - 4.8 g/dL    GLOBULIN, TOTAL 3.1  1.5 - 4.5 g/dL    A-G Ratio 1.5  1.1 - 2.5    Bilirubin, total 1.2  0.0 - 1.2 mg/dL    Alk. phosphatase 47  39 - 117 IU/L    AST 26  0 - 40 IU/L    ALT 18  0 - 32 IU/L   CBC WITH AUTOMATED DIFF       Result Value Ref Range    WBC 6.0  3.4 - 10.8 x10E3/uL    RBC 4.10  3.77 - 5.28 x10E6/uL    HGB 12.2  11.1 - 15.9 g/dL    HCT 36.8  34.0 - 46.6 %    MCV 90  79 - 97 fL    MCH 29.8  26.6 - 33.0 pg    MCHC 33.2  31.5 - 35.7 g/dL    RDW 14.3  12.3 - 15.4 %    PLATELET 282  150 - 379 x10E3/uL    NEUTROPHILS 52      Lymphocytes 39      MONOCYTES 7      EOSINOPHILS 2      BASOPHILS 0      ABS. NEUTROPHILS 3.1  1.4 - 7.0 x10E3/uL    Abs Lymphocytes 2.4  0.7 - 3.1 x10E3/uL    ABS. MONOCYTES 0.4  0.1 - 0.9 x10E3/uL    ABS. EOSINOPHILS 0.1  0.0 - 0.4 x10E3/uL    ABS. BASOPHILS 0.0  0.0 - 0.2  x10E3/uL    IMMATURE GRANULOCYTES 0      ABS. IMM. GRANS. 0.0  0.0 - 0.1 x10E3/uL   T4, FREE       Result Value Ref Range    T4, Free 1.06  0.82 - 1.77 ng/dL   TSH, 3RD GENERATION       Result Value Ref Range    TSH 1.380  0.450 - 4.500 uIU/mL   VITAMIN D, 1, 25 DIHYDROXY       Result Value Ref Range    Calcitriol (Vit D 1, 25 di-OH) 37.1  10.0 - 75.0 pg/mL   LIPID PANEL       Result Value Ref Range    Cholesterol, total 171  100 - 199 mg/dL    Triglyceride 58  0 - 149 mg/dL    HDL Cholesterol 87  >39 mg/dL    VLDL, calculated 12  5 - 40 mg/dL    LDL, calculated 72  0 - 99 mg/dL       Assessment/Plan & Orders:     Imojean was seen today for hypertension and seizure.    Diagnoses and associated orders for this visit:    Unspecified asthma(493.90)    Dysarthria due to cerebrovascular accident  Comments: Pt to see Speech therapy in next few days.    Essential hypertension  Comments: Stable    Chronic LBP  Comments: Pt management appt pending, will continue pain mediction here in the short term.    Tobacco use disorder   Comments: Pt states she has cut down, but is still smoking.    Other Orders  - albuterol (PROVENTIL HFA, VENTOLIN HFA, PROAIR HFA) 90 mcg/actuation inhaler; Take 2 Puffs by inhalation every six (6) hours as needed for Wheezing.  - HYDROcodone-acetaminophen (NORCO) 5-325 mg per tablet; Take 1 Tab by mouth every four (4) hours as needed for Pain. Max Daily Amount: 6 Tabs.  - albuterol sulfate (PROVENTIL;VENTOLIN) 2.5 mg/0.5 mL nebu nebulizer solution; 0.5 mL by Nebulization route every four (4) hours as needed for Wheezing.          Follow-up Disposition:  Return in about 6 weeks (around 06/23/2013) for RE-EVALUATION.   Reviewed with patient the treatment plan, goals of treatment plan, and limitations of treatment plan, to include the potential for side effects from medications.      Patient verbalized understanding and is in agreement with treatment plan as outlined above.  All questions answered.        Charlett Nose MD  [facility]  Ph - [phone]  Fax [phone]

## 2013-05-12 NOTE — Progress Notes (Signed)
1. Have you been to the ER, urgent care clinic since your last visit?  Hospitalized since your last visit?[facility]    2. Have you seen or consulted any other health care providers outside of [facility] since your last visit?  Include any pap smears or colon screening. No     Pt states Methocarbamol 500mg  is making her drowsy and fall asleep fast.

## 2013-05-12 NOTE — Patient Instructions (Signed)
Asthma in Adults: After Your Visit  Your Care Instructions     During an asthma attack, your airways swell and narrow as a reaction to certain things (triggers). This makes it hard to breathe.  You may be able to prevent asthma attacks if you avoid the things that set off your asthma symptoms. Keeping your asthma under control and treating symptoms before they get bad can help you avoid severe attacks.  If you can control your asthma, you may be able to do all of your normal daily activities. You may also avoid asthma attacks and trips to the hospital.  Follow-up care is a key part of your treatment and safety. Be sure to make and go to all appointments, and call your doctor if you are having problems. It's also a good idea to know your test results and keep a list of the medicines you take.  How can you care for yourself at home?  ?? Follow your asthma action plan so you can manage your symptoms at home. An asthma action plan will help you prevent and control airway reactions and will tell you what to do during an asthma attack. If you do not have an asthma action plan, work with your doctor to build one.  ?? Take your asthma medicine exactly as prescribed. Medicine plays an important role in controlling asthma. Talk to your doctor right away if you have any questions about what to take and how to take it.  ?? Use your quick-relief medicine when you have symptoms of an attack. Quick-relief medicine often is an albuterol inhaler. Some people need to use quick-relief medicine before they exercise.  ?? Take your controller medicine every day, not just when you have symptoms. Controller medicine is usually an inhaled corticosteroid. The goal is to prevent problems before they occur. Do not use your controller medicine to try to treat an attack that has already started. It does not work fast enough to help.  ?? If your doctor prescribed corticosteroid pills to use during an attack, take them as directed. They may take  hours to work, but they may shorten the attack and help you breathe better.  ?? Keep your quick-relief medicine with you at all times.  ?? Talk to your doctor before using other medicines. Some medicines, such as aspirin, can cause asthma attacks in some people.  ?? If you have a peak flow meter, use it to check how well you are breathing. This can help you predict when an asthma attack is going to occur. Then you can take medicine to prevent the asthma attack or make it less severe.  ?? See your doctor regularly. These visits will help you learn more about asthma and what you can do to control it. Your doctor will monitor your treatment to make sure the medicine is helping you.  ?? Keep track of your asthma attacks and your treatment. After you have had an attack, write down what triggered it, what helped end it, and any concerns you have about your asthma action plan. Take your diary when you see your doctor. You can then review your asthma action plan and decide if it is working.  ?? Do not smoke or allow others to smoke around you. Avoid smoky places. Smoking makes asthma worse. If you need help quitting, talk to your doctor about stop-smoking programs and medicines. These can increase your chances of quitting for good.  ?? Learn what triggers an asthma attack for you, and avoid   the triggers when you can. Common triggers include colds, smoke, air pollution, dust, pollen, mold, pets, cockroaches, stress, and cold air.  ?? Avoid colds and the flu. Get a pneumococcal vaccine shot. If you have had one before, ask your doctor whether you need a second dose. Get a flu vaccine every fall. If you must be around people with colds or the flu, wash your hands often.  When should you call for help?  Call 911 anytime you think you may need emergency care. For example, call if:  ?? You have severe trouble breathing.  Call your doctor now or seek immediate medical care if:  ?? Your symptoms do not get better after you have followed your  asthma action plan.  ?? You cough up yellow, dark brown, or bloody mucus (sputum).  Watch closely for changes in your health, and be sure to contact your doctor if:  ?? Your coughing and wheezing get worse.  ?? You need to use quick-relief medicine on more than 2 days a week (unless it is just for exercise).  ?? You need help figuring out what is triggering your asthma attacks.   Where can you learn more?   Go to [email or web address].[email or web address]/[email or web address]  Enter P597 in the search box to learn more about "Asthma in Adults: After Your Visit."   ?? 2006-2015 Healthwise, Incorporated. Care instructions adapted under license by [employer] (which disclaims liability or warranty for this information). This care instruction is for use with your licensed healthcare professional. If you have questions about a medical condition or this instruction, always ask your healthcare professional. Healthwise, Incorporated disclaims any warranty or liability for your use of this information.  Content Version: 10.4.390249; Current as of: September 17, 2012              Developing a Pain Management Plan: After Your Visit  Your Care Instructions  Some diseases and injuries can cause pain that lasts a long time. You don't need to live with uncontrolled pain. A pain management plan helps you find ways to control pain with side effects you can live with.  There are things you can do to help with pain. Only you know how much pain you feel. Constant pain can make you depressed. It can cause stress and make it hard for you to eat and sleep. But there are ways to control the pain. They can help you stay active, improve your mood, and heal faster.  Your plan can include many types of pain control. You may take prescription or over-the-counter drugs. You can also try physical treatments and behavioral methods. Some medical treatments also help with pain. For example, radiation can be used to reduce pain from bone cancer.  You and your doctor will work to  make your plan. Be sure to read it often. Change it if your pain is not under control.  Follow-up care is a key part of your treatment and safety. Be sure to make and go to all appointments, and call your doctor if you are having problems. It's also a good idea to know your test results and keep a list of the medicines you take.  How can you care for yourself at home?  Physical treatments  ?? Be safe with medicines. Take pain medicines exactly as directed.  ?? If the doctor gave you a prescription medicine for pain, take it exactly as prescribed.  ?? If you are not taking a prescription pain medicine, ask your doctor if you can take  an over-the-counter medicine.  ?? If your pain medicine causes side effects such as constipation or nausea, you may need to take other medicines for those problems. Talk to your doctor about any side effects you have.  ?? Hot or cold compresses applied directly to the skin can help sore muscles. Put ice or a cold pack on the painful area for 10 to 20 minutes at a time. Put a thin cloth between the ice and your skin. You may find that it helps to switch between cold and heat. Try a heating pad set on low or a warm cloth on the area for 10 to 15 minutes at a time.  ?? Hydrotherapy uses flowing water to relax muscles. You may want to sit in a hot tub or a steam bath. Or use a shower or a sitz bath to help your pain.  ?? Massage therapy is rubbing the soft tissues of the body. It eases tension and pain. It also improves blood flow and helps you relax.  ?? Transcutaneous electrical nerve stimulation (TENS) uses a gentle electric current applied to the skin for pain relief. You can use TENS at home after you learn how.  ?? Acupuncture is a form of traditional [place] medicine. It uses very thin needles inserted into certain points of the body. It is done by a person with special training (acupuncturist).  ?? If you get physical therapy, make sure to do any home exercises or stretching your therapist has  prescribed.  ?? Stay as active as you can. Try to get some physical activity every day.  Behavioral treatments   ?? Biofeedback teaches you to control a body function that you normally don't think about. These are things like skin temperature, heart rate, and blood pressure. At first, you will use a machine to give you feedback on the function you want to control. Then a therapist will teach you what to do next. Over time, you can stop using the machine.  ?? Breathing techniques can help you relax and get rid of tension.  ?? Guided imagery is a series of thoughts and images that can focus your attention away from your pain. When you do it, you use all your senses to help your body respond as though what you are imagining is real.  ?? Hypnosis is a state of focused concentration that makes you less aware of your surroundings. It may cause your brain to release chemicals that relieve pain. You can have a therapist help you through hypnosis. Or you can learn to use it on yourself.  ?? Cognitive behavioral therapy is a type of counseling. It helps you change your thought patterns. Changes in your thoughts can help change your behavior and the way you perceive your body.  Other treatments and ideas  ?? Aromatherapy uses the scent of oils obtained from plants to help you relax or to relieve stress.  ?? Meditation focuses your attention to give a clear awareness of your life. You sit quietly, focus on one image or sound, and breathe deeply.  ?? Yoga uses stretching and exercises (called postures) to reduce stress and improve flexibility and health.  ?? Keep track of your pain in a pain diary. This can help you understand how the things you do affect your pain.  ?? In rare cases, your doctor may advise you to get a nerve block to help with pain. A nerve block is a shot of medicine to interrupt pain signals to your brain.  ??  Some hospitals have special pain clinics or centers. Your doctor may refer you to a pain clinic or center. Or  you can ask your doctor about them.   Where can you learn more?   Go to [email or web address].huhttp://www.healthwise.net/[email or web address]  Enter I556 in the search box to learn more about "Developing a Pain Management Plan: After Your Visit."   ?? 2006-2015 Healthwise, Incorporated. Care instructions adapted under license by [employer] (which disclaims liability or warranty for this information). This care instruction is for use with your licensed healthcare professional. If you have questions about a medical condition or this instruction, always ask your healthcare professional. Healthwise, Incorporated disclaims any warranty or liability for your use of this information.  Content Version: 10.4.390249; Current as of: April 25, 2012              Stopping Smoking: After Your Visit  Your Care Instructions  Cigarette smokers crave the nicotine in cigarettes. Giving it up is much harder than simply changing a habit. Your body has to stop craving the nicotine. It is hard to quit, but you can do it. There are many tools that people use to quit smoking. You may find that combining tools works best for you.  There are several steps to quitting. First you get ready to quit. Then you get support to help you. After that, you learn new skills and behaviors to become a nonsmoker. For many people, a necessary step is getting and using medicine.  Your doctor will help you set up the plan that best meets your needs. You may want to attend a smoking cessation program to help you quit smoking. When you choose a program, look for one that has proven success. Ask your doctor for ideas. You will greatly increase your chances of success if you take medicine as well as get counseling or join a cessation program.  Some of the changes you feel when you first quit tobacco are uncomfortable. Your body will miss the nicotine at first, and you may feel short-tempered and grumpy. You may have trouble sleeping or concentrating. Medicine can help you deal with these symptoms.  You may struggle with changing your smoking habits and rituals. The last step is the tricky one: Be prepared for the smoking urge to continue for a time. This is a lot to deal with, but keep at it. You will feel better.  Follow-up care is a key part of your treatment and safety. Be sure to make and go to all appointments, and call your doctor if you are having problems. It???s also a good idea to know your test results and keep a list of the medicines you take.  How can you care for yourself at home?  ?? Ask your family, friends, and coworkers for support. You have a better chance of quitting if you have help and support.  ?? Join a support group, such as Nicotine Anonymous, for people who are trying to quit smoking.  ?? Consider signing up for a smoking cessation program, such as the American Lung Association's Freedom from Smoking program.  ?? Set a quit date. Pick your date carefully so that it is not right in the middle of a big deadline or stressful time. Once you quit, do not even take a puff. Get rid of all ashtrays and lighters after your last cigarette. Clean your house and your clothes so that they do not smell of smoke.  ?? Learn how to be a nonsmoker. Think about ways you  can avoid those things that make you reach for a cigarette.  ?? Avoid situations that put you at greatest risk for smoking. For some people, it is hard to have a drink with friends without smoking. For others, they might skip a coffee break with coworkers who smoke.  ?? Change your daily routine. Take a different route to work or eat a meal in a different place.  ?? Cut down on stress. Calm yourself or release tension by doing an activity you enjoy, such as reading a book, taking a hot bath, or gardening.  ?? Talk to your doctor or pharmacist about nicotine replacement therapy, which replaces the nicotine in your body. You still get nicotine but you do not use tobacco. Nicotine replacement products help you slowly reduce the amount of nicotine you  need. These products come in several forms, many of them available over-the-counter:  ?? Nicotine patches  ?? Nicotine gum and lozenges  ?? Nicotine inhaler  ?? Ask your doctor about bupropion (Wellbutrin) or varenicline (Chantix), which are prescription medicines. They do not contain nicotine. They help you by reducing withdrawal symptoms, such as stress and anxiety.  ?? Some people find hypnosis, acupuncture, and massage helpful for ending the smoking habit.  ?? Eat a healthy diet and get regular exercise. Having healthy habits will help your body move past its craving for nicotine.  ?? Be prepared to keep trying. Most people are not successful the first few times they try to quit. Do not get mad at yourself if you smoke again. Make a list of things you learned and think about when you want to try again, such as next week, next month, or next year.   Where can you learn more?   Go to [facility]  Enter Y522 in the search box to learn more about "Stopping Smoking: After Your Visit."   ?? 2006-2015 Healthwise, Incorporated. Care instructions adapted under license by [employer] (which disclaims liability or warranty for this information). This care instruction is for use with your licensed healthcare professional. If you have questions about a medical condition or this instruction, always ask your healthcare professional. Healthwise, Incorporated disclaims any warranty or liability for your use of this information.  Content Version: 10.4.390249; Current as of: September 17, 2012

## 2013-05-14 NOTE — Telephone Encounter (Signed)
Pt called but I was unable to help her, I could not understand her reason for calling, pt indicated that she would have someone else call.

## 2013-05-21 NOTE — Telephone Encounter (Signed)
Requested Prescriptions     Pending Prescriptions Disp Refills   ??? methocarbamol (ROBAXIN) 500 mg tablet 20 Tab 0     Sig: Take 1 Tab by mouth four (4) times daily.

## 2013-05-21 NOTE — Telephone Encounter (Signed)
Pt stated that she is in a lot of pain and need a refill on pain medicine. Pt did not state which medication she needed. It was very hard to understand what she was saying.

## 2013-05-22 MED ORDER — HYDROCODONE-ACETAMINOPHEN 5 MG-325 MG TAB
5-325 mg | ORAL_TABLET | ORAL | Status: DC | PRN
Start: 2013-05-22 — End: 2013-06-23

## 2013-05-22 MED ORDER — METHOCARBAMOL 500 MG TAB
500 mg | ORAL_TABLET | Freq: Four times a day (QID) | ORAL | Status: DC
Start: 2013-05-22 — End: 2013-06-23

## 2013-05-22 NOTE — Telephone Encounter (Signed)
Pt is requesting refill on pain medication.

## 2013-05-23 NOTE — Telephone Encounter (Signed)
Attempted to reach pt concerning this matter, tried number on chart, unable to reach pt. Pt has a rx that is ready for pickup.

## 2013-05-23 NOTE — Telephone Encounter (Signed)
Pt says the muscle relaxers are not helping her, and she is getting worse, not able to walk very well.  Please call and discuss options.Marland Kitchen..Marland Kitchen

## 2013-05-26 NOTE — Telephone Encounter (Signed)
1st attempt to reach patient.

## 2013-05-26 NOTE — Telephone Encounter (Signed)
Notified pt that rx is ready for pick-up. Pt verbalized understanding.

## 2013-05-26 NOTE — Telephone Encounter (Signed)
Pt is requesting to speak with a nurse reguarding transportation to pick up meds.

## 2013-05-27 NOTE — Telephone Encounter (Signed)
Pt called stating she received a letter from our office & she stated she had "already done all that". Please call her to discuss.

## 2013-05-28 NOTE — Telephone Encounter (Signed)
Medication is upfront waiting for pick up.

## 2013-06-09 NOTE — Telephone Encounter (Signed)
Spoke with [employer], MD's office and asked status of referral- Dr Towana Badger will see pt- they will call today to set up an appointment.  I spoke with pts friend Casimiro Needle, informed pt to be expecting a call today from Dr Towana Badger.

## 2013-06-09 NOTE — Telephone Encounter (Signed)
Cindy with  pain management stated that the practice will not accept patient Jniah Fishburne as a patient due to no stable home environment. Stated she spoke with Barry Dienes on the pt's behalf. Stated the address on file for her is a hotel and has been living there for 3 years. Arline Asp stated Barry Dienes stated she will have a stable residence with in a month. Arline Asp stated cannot accept pt.

## 2013-06-10 NOTE — Progress Notes (Signed)
[facility]  [address], [address]  [place], [place] [address]  Ph: [phone] Fax: [phone]     Plan of Care/ Statement of Necessity for Speech Therapy Services    Wanda Doyle        Date: 06/10/2013   DOB: 1955-06-07  Daryll Drown, MD  Diagnosis: Aphasia  Onset Date:11/21/2011  Start of Care: 06/10/2013  Prior Level of Function: normal speech-language function, not driving, managing finances and appointments, independent with husband  Comorbidities: HTN, asthma, arthritis, osteoporosis, epilepsy, ETOH use, cocaine use, tobacco use    The Plan of Care and following information is based on the information from the initial evaluation.  Assessment/ key information: Patient is a 58 year old female s/p left lateral cerebellar CVA. She currently lives alone. Patient reports that she does not remember the CVA; she woke up in the hospital unable to move the right side of her body, swallow, or talk. She reports her current level of speech functioning has worsened since the day of CVA. Per initial interview patient is deemed a poor historian, presenting with conflicting stories.    An evaluation was conducted with the use of various sub-tests of Aphasia assessments revealing the following results. 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 Her verbal skills are severely impaired with frequent breakdown at the word level, articulation errors, and circumlocution. Patient exhibited instances of semantic paraphasias and phonemic jargon to avoid breakdown in speech. She often exhibited repeated attempts to reach desired word or phrase using a conduit d'approach and circumlocutions; it is important to note that these attempts would often end in giving up saying "I don't know what I am trying to say." Patient's basic auditory comprehension was within  functional limits with the ability to answer simple yes/no questions, basic problem solving statements, simple orientation and awareness questions, and reading comprehension questions.     Skilled speech therapy intervention warranted to facilitate effective expressive communication for participation in daily living activities requiring intact communication; specifically communication ADLs and social re-integration.     Problem List:   Aphasia    Treatment Plan may include any combination of the following: Aphasia Treatment    Patient / Family readiness to learn indicated by: trying to perform skills    Persons(s) to be included in education:   patient (P)    Barriers to Learning/Limitations: yes; Speech; nature of broca's aphasia    Patient Goal (s): "I want to speak better"    Rehabilitation Potential: fair    Short Term Goals: To be accomplished in 8-10  weeks  1. Patient will continue to participate in diagnostic procedures to further assess deficits within 6 treatment sessions.  2. Patient will demonstrate increased expressive language skills by performing simple sentence completion tasks with 80% accuracy with min assist.  3. Patient will answer simple questions and perform responsive naming tasks and simple picture description task with 80% accuracy with min-mod assist.  4. Patient will demonstrate improved ability to use compensatory communication strategies related to recognizing when her message was successful and decreased circumlocution once her message is conveyed, with mod visual/verbal cues.    Long Term Goals: To be accomplished in 12  weeks   1. Patient will demonstrate increased expressive language skills to communicate her wants, needs, and thoughts for functional communication related to self-care, safety awaraeness, and medical and community environments with  80% accuracy with min-mod assist.     Frequency / Duration: Patient to be seen 1-2 times per week for 12 weeks:    Patient/ Caregiver  education and instruction: Alternate Communication modes and Compensatory Techniques    Certification Period: 06/20/2013 - 09/02/2013    Dana Allan SLP Intern for  Felecia Jan, SLP 06/10/2013 2:40 PM    OUTPATIENT COGNITIVE-COMMUNICATION EVALUATION    OBJECTIVE  Receptive Language:  Receptive vocabulary    []  WNL    []  Impaired []  Mild []  Mod []  Severe  Reliability of yes/no responses to questions [x]  WNL    []  Impaired []  Mild []  Mod []  Severe   Reliability of responses to complex ideation []  WNL    []  Impaired []  Mild []  Mod []  Severe  Auditory retention/processing   []  WNL    [x]  Impaired []  Mild []  Mod []  Severe  Follow commands []  1 Step []  2 Step []  3 Step []  Plex  Objective Information:    Expressive Language  Automatic speech   []  WNL    []  Impaired []  Mild [x]  Mod []  Severe  Able to identify objects/pictures  []  WNL    []  Impaired []  Mild [x]  Mod []  Severe  Preservations    []  WNL    []  Impaired []  Mild []  Mod []  Severe  Word retrieval    []  WNL    []  Impaired []  Mild []  Mod [x]  Severe  Paraphasias    []  Literal    [x]  Phenomic   []  Jargon   [x]  Semantic  Able to make needs known at level []  Word   [x]  Phrase   []  Sentence   []  Gesture  Objective Information:    Cognition:  Attention:  [x]  Alert    []  Drowsy  Orientation:  [x]  Person   []  Place    []  Time  Attention to task: []  Able to sustain for (min):     []  Able to redirect with cues Min:    Mod:    Max:   Focused Attention:  Memory:  [x]  WNL    []  Impaired ST   []  Impaired LT   Digit Span: 4   Sentence Length:   Paragraph Length:  Objective information:    Executive Functions:  Neglect:  []  None    []  Left   []  Right  Self-regulation  [x]  Appropriate   []  Impulsive   []  Requires verbal cues  Awareness:  []  Poor initiation   []  Disinhibition   []  Constant supervision  Problem Solving: [x]  WNL    []  Impaired []  Mild []  Mod []  Severe  Verbal Problem Solving:  Situational Problem Solving:  Judgement:  Reasoning:  Inferences:  Abstract Language:   Language Organization:    Speech:  Oral Verbal Apraxia: []  None    []  Mild    [x]  Moderate    []  Severe   Dysarthria:  [x]  None    []  Mild    []  Moderate    []  Severe   Intelligibility:  []  WNL    []  Words   []  Phrases   []  Sentences   [x]  Conversation      % Intelligible: 50%    Writing:  []  L or []  R []  WNL    [x]  Impaired @ [x]  Word []  Sentence []  Paragraph    Reading:  []  WNL    [x]  Impaired @ []  Word []  Sentence [x]  Paragraph  ________________________________________________________________________    I certify that the above Therapy Services are being furnished while the patient is under my care. I agree with the treatment plan and certify that this therapy is necessary.    Y or N I have read the above and request that my patient continue as recommended.  Y or N I have read the above report and request that my patient continue therapy with the following changes/special instructions:  Y or N I have read the above report and request that my patient be discharged from therapy    Physician's Signature:____________________  Date/Time:________________      Please sign and return to [facility]   Fax: [phone]

## 2013-06-23 MED ORDER — METHOCARBAMOL 500 MG TAB
500 mg | ORAL_TABLET | Freq: Four times a day (QID) | ORAL | Status: DC
Start: 2013-06-23 — End: 2014-06-17

## 2013-06-23 MED ORDER — HYDROCODONE-ACETAMINOPHEN 5 MG-325 MG TAB
5-325 mg | ORAL_TABLET | ORAL | Status: DC | PRN
Start: 2013-06-23 — End: 2013-07-04

## 2013-06-23 NOTE — Progress Notes (Signed)
Internal Medicine  Progress Note  Today's Date:  06/23/2013   Patient:  Wanda Doyle  Patient DOB:  09/12/1955    Subjective:     Chief Complaint   Patient presents with   ??? Back Pain   ??? Knee Pain        HPI: Laqueshia Cihlar is a 58 y.o. African American female who presents for scheduled f/u. Pt still with complaint of LBP, now with c/o Rt knee pain as well. Denies specific knee injury, or previous similar episodes. No swelling, discoloration, locking, or popping.     ROS:   General: negative for - chills, fatigue, fever, weight change  ENT: negative for - headaches,eye pain.  Resp: negative for - cough, shortness of breath, or wheezing  CV: negative for - chest pain, edema or palpitations  Heme/ Lymph: negative for - bleeding problems, bruising, pallor or swollen lymph nodes  Derm: negative for - dry skin, hair changes, rash or skin lesion changes  Psych: negative for - anxiety, depression, irritability or mood swings,suicidal, or homicidal ideation      Past Medical History   Diagnosis Date   ??? Seizures ([place])    ??? Hypertension    ??? Stroke [facility])      Past Surgical History   Procedure Laterality Date   ??? Hx hysterectomy           Current Outpatient Meds and Allergies     Current Outpatient Prescriptions   Medication Sig Dispense Refill   ??? ribavirin (RIBASPHERE) 200 mg tablet Take 200 mg by mouth two (2) times a day. Take two tablets in the am and two tablets in the pm by mouth       ??? sofosbuvir (SOVALDI) 400 mg tab Take  by mouth.       ??? methocarbamol (ROBAXIN) 500 mg tablet Take 1 Tab by mouth four (4) times daily.  20 Tab  0   ??? HYDROcodone-acetaminophen (NORCO) 5-325 mg per tablet Take 1 Tab by mouth every four (4) hours as needed for Pain. Max Daily Amount: 6 Tabs.  12 Tab  0   ??? albuterol (PROVENTIL HFA, VENTOLIN HFA, PROAIR HFA) 90 mcg/actuation inhaler Take 2 Puffs by inhalation every six (6) hours as needed for Wheezing.  1 Inhaler  6   ??? albuterol sulfate (PROVENTIL;VENTOLIN) 2.5 mg/0.5 mL nebu  nebulizer solution 0.5 mL by Nebulization route every four (4) hours as needed for Wheezing.  20 mL  11   ??? simvastatin (ZOCOR) 40 mg tablet Take 40 mg by mouth nightly.       ??? omeprazole (PRILOSEC) 20 mg capsule Take 20 mg by mouth daily.       ??? hydrochlorothiazide (HYDRODIURIL) 25 mg tablet Take 25 mg by mouth daily.       ??? levETIRAcetam (KEPPRA) 500 mg tablet Take  by mouth two (2) times a day.       ??? potassium chloride (KLOR-CON) 20 mEq packet Take 20 mEq by mouth daily.       ??? aspirin 81 mg chewable tablet 162 mg.       ??? megestrol (MEGACE) 400 mg/10 mL (40 mg/mL) suspension     3         Allergies   Allergen Reactions   ??? Penicillins Nausea and Vomiting          Objective:       BP Readings from Last 3 Encounters:   06/23/13 139/88   05/12/13 125/78  05/07/13 138/78     VS:  BP 139/88   Pulse 84   Temp(Src) 98.5 ??F (36.9 ??C) (Oral)   Resp 18   Ht _0  (1.651 m)   Wt 109 lb (49.442 kg)   BMI 18.14 kg/m2   SpO2 97%    Body mass index is 18.14 kg/(m^2).  General:   W/D, W/N, well-groomed,  pleasant, in no acute distress.      Head:  NCAT,                                  Eyes:             PERRLA,EOMI, sclera clear, conjunctiva pink.  Neck:              no adenopathy, thyromegaly, JVD, or bruits.  Cardiovasc:   Regular rate and rhythm, no murmurs, no rubs, no gallops.  Pulmonary:    Normal respiratory effort, good air movement, no wheezing, rales, rhonchi.   Extremities:   No clubbing,cyanosis, edema.  MUS/SKE:     Tenderness in para-spinous muscles L-S spine region, reduced ROM due too pain. Rt knee FROM, minimal crepitus, no swelling or discoloration. Tenderness along medial joint line and over the patella, ligaments intact.  Integumentary: no rashes, or lesions noted. Skin dry, smooth, and warm.  Neuro:   A&Ox3, conversant, appropriate,remains markedly dysarthric.    Results for orders placed in visit on 95/62/13   METABOLIC PANEL, COMPREHENSIVE       Result Value Ref Range    Glucose 89  65 - 99 mg/dL     BUN 12  8 - 27 mg/dL    Creatinine 0.97  0.57 - 1.00 mg/dL    GFR est non-AA 64  >59 mL/min/1.73    GFR est AA 73  >59 mL/min/1.73    BUN/Creatinine ratio 12  11 - 26    Sodium 145 (*) 134 - 144 mmol/L    Potassium 4.9  3.5 - 5.2 mmol/L    Chloride 105  97 - 108 mmol/L    CO2 19  18 - 29 mmol/L    Calcium 10.0  8.7 - 10.3 mg/dL    Protein, total 7.8  6.0 - 8.5 g/dL    Albumin 4.7  3.6 - 4.8 g/dL    GLOBULIN, TOTAL 3.1  1.5 - 4.5 g/dL    A-G Ratio 1.5  1.1 - 2.5    Bilirubin, total 1.2  0.0 - 1.2 mg/dL    Alk. phosphatase 47  39 - 117 IU/L    AST 26  0 - 40 IU/L    ALT 18  0 - 32 IU/L   CBC WITH AUTOMATED DIFF       Result Value Ref Range    WBC 6.0  3.4 - 10.8 x10E3/uL    RBC 4.10  3.77 - 5.28 x10E6/uL    HGB 12.2  11.1 - 15.9 g/dL    HCT 36.8  34.0 - 46.6 %    MCV 90  79 - 97 fL    MCH 29.8  26.6 - 33.0 pg    MCHC 33.2  31.5 - 35.7 g/dL    RDW 14.3  12.3 - 15.4 %    PLATELET 282  150 - 379 x10E3/uL    NEUTROPHILS 52      Lymphocytes 39      MONOCYTES 7      EOSINOPHILS 2  BASOPHILS 0      ABS. NEUTROPHILS 3.1  1.4 - 7.0 x10E3/uL    Abs Lymphocytes 2.4  0.7 - 3.1 x10E3/uL    ABS. MONOCYTES 0.4  0.1 - 0.9 x10E3/uL    ABS. EOSINOPHILS 0.1  0.0 - 0.4 x10E3/uL    ABS. BASOPHILS 0.0  0.0 - 0.2 x10E3/uL    IMMATURE GRANULOCYTES 0      ABS. IMM. GRANS. 0.0  0.0 - 0.1 x10E3/uL   T4, FREE       Result Value Ref Range    T4, Free 1.06  0.82 - 1.77 ng/dL   TSH, 3RD GENERATION       Result Value Ref Range    TSH 1.380  0.450 - 4.500 uIU/mL   VITAMIN D, 1, 25 DIHYDROXY       Result Value Ref Range    Calcitriol (Vit D 1, 25 di-OH) 37.1  10.0 - 75.0 pg/mL   LIPID PANEL       Result Value Ref Range    Cholesterol, total 171  100 - 199 mg/dL    Triglyceride 58  0 - 149 mg/dL    HDL Cholesterol 87  >39 mg/dL    VLDL, calculated 12  5 - 40 mg/dL    LDL, calculated 72  0 - 99 mg/dL       Assessment/Plan & Orders:     Kailany was seen today for no specified reason.    Diagnoses and associated orders for this visit:    Chronic LBP   Comments: Awaiting Pain Management evaluation  - Cancel: XR SPINE LUMB 2 OR 3 V; Future  - XR SPINE LUMB MIN 4 V; Future    Knee pain, acute, right  - XR KNEE RT MIN 4 V; Future    Chronic hepatitis C without hepatic coma ([place])  Comments: Per GI    Serum sodium elevated  - METABOLIC PANEL, BASIC    Other Orders  - methocarbamol (ROBAXIN) 500 mg tablet; Take 1 Tab by mouth four (4) times daily.  - HYDROcodone-acetaminophen (NORCO) 5-325 mg per tablet; Take 1 Tab by mouth every four (4) hours as needed for Pain. Max Daily Amount: 6 Tabs.          Follow-up Disposition:  Return in about 8 weeks (around 08/18/2013) for RE-EVALUATION.   Reviewed with patient the treatment plan, goals of treatment plan, and limitations of treatment plan, to include the potential for side effects from medications and procedures. If side effects occur, it is the responsibility of the patient to inform the clinic so that a change in the treatment plan can be made in a safe manner. The patient is advised that stopping prescribed medication may cause an increase in symptoms and possible medication withdrawal symptoms. The patient is informed an emergency room evaluation may be necessary if this occurs.      Patient verbalized understanding and is in agreement with treatment plan as outlined above.  All questions answered.        Charlett Nose MD  [facility]  Ph - [phone]  Fax [phone]

## 2013-06-23 NOTE — Progress Notes (Signed)
1. Have you been to the ER, urgent care clinic since your last visit?  Hospitalized since your last visit?No    2. Have you seen or consulted any other health care providers outside of the [facility] since your last visit?  Include any pap smears or colon screening. No     Pt was referred to Dr. Kristin BruinsAntonio Quidgley-Nevares, MD for pain management. Pt states that she is cannot afford the visit co-pays. Referral has also been made to [facility].

## 2013-06-24 ENCOUNTER — Encounter

## 2013-06-24 LAB — METABOLIC PANEL, BASIC
BUN/Creatinine ratio: 20 (ref 11–26)
BUN: 19 mg/dL (ref 8–27)
CO2: 17 mmol/L — ABNORMAL LOW (ref 18–29)
Calcium: 9.5 mg/dL (ref 8.7–10.3)
Chloride: 100 mmol/L (ref 97–108)
Creatinine: 0.97 mg/dL (ref 0.57–1.00)
GFR est AA: 73 mL/min/{1.73_m2} (ref >59)
GFR est non-AA: 64 mL/min/{1.73_m2} (ref >59)
Glucose: 78 mg/dL (ref 65–99)
Potassium: 4.4 mmol/L (ref 3.5–5.2)
Sodium: 138 mmol/L (ref 134–144)

## 2013-06-24 NOTE — Progress Notes (Signed)
Quick Note:    Please inform Pt lab normal. Continue present management, f/u as scheduled, or PRN.    ______

## 2013-06-24 NOTE — Progress Notes (Signed)
Quick Note:    Please inform Pt Xrays were remarkable for some minimal degenerative changes. Will need MRI of back, and PT before any further Narcotics are written from this office    ______

## 2013-06-24 NOTE — Progress Notes (Signed)
ST DAILY TREATMENT NOTE    Patient Name: Wanda Doyle  Date:06/24/2013  DOB: 1955/03/10  [x]   Patient DOB Verified  Payor: [place] [employer] / Plan: [place]  [employer] / Product Type: Managed Care Medicare /   In time:1130  Out time:1155  Total Treatment Time (min): 25  Total Timed Codes (min): 25  1:1 Treatment Time (MC only): 25   Visit #: 2 of 2    SUBJECTIVE  Pain Level (0-10 scale): 0  Any medication changes, allergies to medications, adverse drug reactions, diagnosis change, or new procedure performed?: [x]  No    []  Yes (see summary sheet for update)  Subjective functional status/changes:   []  No changes reported  "I just want to get my talking straight"    OBJECTIVE  Treatment provided includes:__  Increase/Improve:  []   Voice Quality []   Eating/Swallowing Skills  []   Writing Skills   []   Vocal Loudness []   Cognitive Linguistic Skills []   Speech Intelligibility   []   Fluency []   Auditory Comprehension []   Oral Motor Skills   []   Reading Comprehension [x]   Expressive Language []   Safety Awareness   []   Breath Support/Coord. []   Articulation      Decrease:  []   Dysphagia []   Apraxia []   Dysphonia   []   Dysarthria []   Dysfluency []   Cognitive Ling. Deficit   [x]   Aphasia       Patient was able to:  Data           #correct  #trails  %  Cues/Comments   See descriptive date below.                                                                                    Patient Education: []  Review HEP    []  Progressed/Changed HEP based on:_  HEP/Handouts given:_    Patient/Caregiver instruction/education:_ Educated patient regarding purpose of therapy tasks and diagnostic results.  (minutes: 5)    Pain Level (0-10 scale) post treatment: 0    ASSESSMENT  []   See Plan of Care  []   See progress note/recertification  [x]   Patient will continue to benefit from skilled therapy to address remaining functional deficits: _ Aphasia    Progress towards goals / Updated goals:_ Patient  presented to therapy ready to engage in all therapy tasks, which included administering subtests of various aphasia assessments for diagnostic purposes. Pt completed 4-sentence paragraph level retell task with 4/18 details retold (22%) with 0 cues, demonstrating breakdown in mod complex comprehension skills.  Patient appropriately answered logic questions correctly in 4/8 (50%) opportunities using the conduit d???approache x3 times. Pt sequenced daily activities in 2/5 (40%) opportunities allowing for circumlocutions x4. During verbal expression task targeting familiar word definition at phrase level, pt produced appropriate response in 70% opportunities x10 again allowing for circumlocutions. Pt continue to be motivated during ST sessions. Continue POC as indicated.    PLAN  []   Upgrade activities as tolerated     [x]   Continue plan of care  []   Discharge due to:__  []  Other:__    1. Patient will continue to participate in  diagnostic procedures to further assess deficits within 6 treatment sessions.  2. Patient will demonstrate increased expressive language skills by performing simple sentence completion tasks with 80% accuracy with min assist.  3. Patient will answer simple questions and perform responsive naming tasks and simple picture description task with 80% accuracy with min-mod assist.  4. Patient will demonstrate improved ability to use compensatory communication strategies related to recognizing when her message was successful and decreased circumlocution once her message is conveyed, with mod visual/verbal cues.    Dana Allanebecca Zens SLP Intern for  Wanda Doyle, SLP 06/24/2013  3:11 PM

## 2013-06-26 NOTE — Telephone Encounter (Signed)
Carney LivingLinda Clark with [facility] stated spoke with pt Wanda DimmerShiley Einstein. Bonita QuinLinda Stated pt requested to have MRI done at [facility] because she lives in [place].  Bonita QuinLinda also stated that pt has concerns with one of her medications but she did not specify which one.

## 2013-06-26 NOTE — Telephone Encounter (Signed)
MRI will be fax[facility] to [facility].

## 2013-06-26 NOTE — Telephone Encounter (Signed)
-----   Message from Daryll DrownEric L Jones, MD sent at 06/24/2013  8:28 AM EDT -----  Please inform Pt lab normal.  Continue present management, f/u as scheduled, or PRN.

## 2013-06-26 NOTE — Telephone Encounter (Signed)
Notified pt of lab and x ray results. Encouraged pt to get MRI completed soon. Pt understands that narcotics will not be rx'ed from this clinic. Pt verbalized understanding.

## 2013-07-01 NOTE — Progress Notes (Signed)
ST DAILY TREATMENT NOTE    Patient Name: Wanda Doyle  Date:07/01/2013  DOB: 11/26/1955    Patient DOB Verified  Payor: [place] [employer] [employer] / Plan: [facility] [place] / Product Type: Managed Care Medicare /   In time:930  Out time:1000  Total Treatment Time (min): 30  Total Timed Codes (min): 30  1:1 Treatment Time (MC only): 30   Visit #: 3 of 3    SUBJECTIVE  Pain Level (0-10 scale): 0  Any medication changes, allergies to medications, adverse drug reactions, diagnosis change, or new procedure performed?:  No     Yes (see summary sheet for update)  Subjective functional status/changes:    No changes reported  "I'm as good as I will be"    OBJECTIVE  Treatment provided includes:__  Increase/Improve:    Voice Audiological scientistQuality   Eating/Swallowing Skills    Writing Skills     Vocal Loudness   Cognitive Linguistic Skills   Speech Intelligibility     Fluency   Auditory Comprehension   Oral Motor Skills     Reading Comprehension   Expressive Language   Safety Awareness     Breath Support/Coord.   Articulation      Decrease:    Dysphagia   Apraxia   Dysphonia     Dysarthria   Dysfluency   Cognitive Ling. Deficit     Aphasia       Patient was able to:  Data           #correct  #trails  %  Cues/Comments   See descriptive data below.                                                                                  Patient Education:  Review HEP     Progressed/Changed HEP based on:_  HEP/Handouts given:_ Memory Techniques; paragraphs  Patient/Caregiver instruction/education:_ Discussed the importance of completing HEP; pt verbalized understanding  (minutes: 5)    Pain Level (0-10 scale) post treatment: 0    ASSESSMENT    See Plan of Care    See progress note/recertification    Patient will continue to benefit from skilled therapy to address remaining functional deficits: _ aphasia    Progress towards goals / Updated goals:_ Patient presented to therapy  ready to engage in all therapy tasks. Pt was able to repeat 2 sentence paragraphs with ~15% accuracy given 0 cues, 65% accuracy when using visualization memory enhancing strategies x1 occurrence of conduite d'approache. During degenerative naming task, pt was able to name 5 items in ~3 minutes given max cues for last two items. Pt continue to be motivated during ST sessions. Continue POC as indicated.      PLAN    Upgrade activities as tolerated       Continue plan of care    Discharge due to:__   Other:__    1. Patient will continue to participate in diagnostic procedures to further assess deficits within 6 treatment sessions.  2. Patient will demonstrate increased expressive language skills by performing simple sentence completion tasks with 80% accuracy with min assist.  3. Patient will answer simple questions  and perform responsive naming tasks and simple picture description task with 80% accuracy with min-mod assist.  4. Patient will demonstrate improved ability to use compensatory communication strategies related to recognizing when her message was successful and decreased circumlocution once her message is conveyed, with mod visual/verbal cues.    Dana Allanebecca Zens SLP Intern for  Janann Colonelara R Pak, SLP 07/01/2013  11:30 AM

## 2013-07-03 NOTE — Progress Notes (Signed)
ST DAILY TREATMENT NOTE    Patient Name: Wanda Doyle  Date:07/03/2013  DOB: 12/21/1955    Patient DOB Verified  Payor: [place] [employer] [employer] [facility] [facility] / Plan: [facility] / Product Type: Managed Care Medicare /   In time:0930  Out time:1000  Total Treatment Time (min): 30  Total Timed Codes (min): 30  1:1 Treatment Time (MC only): 30   Visit #: 4 of 4    SUBJECTIVE  Pain Level (0-10 scale): 0  Any medication changes, allergies to medications, adverse drug reactions, diagnosis change, or new procedure performed?:  No     Yes (see summary sheet for update)  Subjective functional status/changes:    No changes reported  "I have trouble finding my words still."    OBJECTIVE  Treatment provided includes:__  Increase/Improve:    Voice Location managerQuality   Eating/Swallowing Skills    Writing Skills     Vocal Loudness   Cognitive Linguistic Skills   Speech Intelligibility     Fluency   Auditory Comprehension   Oral Motor Skills     Reading Comprehension   Expressive Language   Safety Awareness     Breath Support/Coord.   Articulation      Decrease:    Dysphagia   Apraxia   Dysphonia     Dysarthria   Dysfluency   Cognitive Ling. Deficit     Aphasia       Patient was able to:  Data           #correct  #trails  %  Cues/Comments   See descriptive data below.                                                                                    Patient Education:  Review HEP     Progressed/Changed HEP based on:_    HEP/Handouts given:_ Analogies  Patient/Caregiver instruction/education:_ importance of completing HEP for carryover; pt verbalized comprehension.  (minutes: 5)    Pain Level (0-10 scale) post treatment: 0    ASSESSMENT    See Plan of Care    See progress note/recertification    Patient will continue to benefit from skilled therapy to address remaining functional deficits: _ Aphasia    Progress towards goals / Updated goals:_ Patient presented to therapy  ready to engage in all therapy tasks. SLP reviewed HEP memory enhancing techniques including visualization, rehearsal, and finding the main idea. Using memory enhancing techniques, pt was able to recall 2 sentence paragraphs with 50% accuracy independently when utilizing memory strategies. During sentence completion task, patient completed simple analogies independently with 30% accuracy, 90% accuracy given maximum cues and support. Pt continue to be motivated during ST sessions. Continue POC as indicated.     PLAN    Upgrade activities as tolerated       Continue plan of care    Discharge due to:__   Other:__    1. Patient will continue to participate in diagnostic procedures to further assess deficits within 6 treatment sessions.  2. Patient will demonstrate increased expressive language skills by performing simple sentence completion tasks with 80% accuracy with min assist.  3. Patient will answer simple questions and perform responsive naming tasks and simple picture description task with 80% accuracy with min-mod assist.  4. Patient will demonstrate improved ability to use compensatory communication strategies related to recognizing when her message was successful and decreased circumlocution once her message is conveyed, with mod visual/verbal cues.    Dana Allanebecca Zens  SLP Intern for  Felecia Janossana A Cardoso, SLP 07/03/2013  10:03 AM

## 2013-07-04 MED ORDER — HYDROCODONE-ACETAMINOPHEN 5 MG-325 MG TAB
5-325 mg | ORAL_TABLET | ORAL | Status: DC | PRN
Start: 2013-07-04 — End: 2013-09-09

## 2013-07-04 NOTE — Telephone Encounter (Signed)
Requested Prescriptions     Pending Prescriptions Disp Refills   ??? HYDROcodone-acetaminophen (NORCO) 5-325 mg per tablet 12 Tab 0     Sig: Take 1 Tab by mouth every four (4) hours as needed for Pain. Max Daily Amount: 6 Tabs.     Pt states will be going to pain management sometime next month. Need a refill on pain meds until then. States all out.

## 2013-07-04 NOTE — Telephone Encounter (Signed)
Inform Pt that no further narcotics will be written by this office after this refill. Am willing to refer Pt to a Opiate treatment program for alternative therapy

## 2013-07-08 NOTE — Progress Notes (Signed)
ST DAILY TREATMENT NOTE    Patient Name: Wanda Doyle  Date:07/08/2013  DOB: [birth year]    Patient DOB Verified  Payor: [facility] [place] [employer] [employer] [facility] / Plan: [facility] / Product Type: Managed Care Medicare /   In time:0900  Out time:0930  Total Treatment Time (min): 30  Total Timed Codes (min): 30  1:1 Treatment Time (MC only): 30   Visit #: 5 of 5    SUBJECTIVE  Pain Level (0-10 scale): 0  Any medication changes, allergies to medications, adverse drug reactions, diagnosis change, or new procedure performed?:  No     Yes (see summary sheet for update)  Subjective functional status/changes:    No changes reported  "I have been relaxing a lot"    OBJECTIVE  Treatment provided includes:__  Increase/Improve:    Voice Location managerQuality   Eating/Swallowing Skills    Writing Skills     Vocal Loudness   Cognitive Linguistic Skills   Speech Intelligibility     Fluency   Auditory Comprehension   Oral Motor Skills     Reading Comprehension   Expressive Language   Safety Awareness     Breath Support/Coord.   Articulation      Decrease:    Dysphagia   Apraxia   Dysphonia     Dysarthria   Dysfluency   Cognitive Ling. Deficit     Aphasia       Patient was able to:  Data           #correct  #trails  %  Cues/Comments   See descriptive data below.                                                                                  Patient Education:  Review HEP     Progressed/Changed HEP based on:_   HEP/Handouts given:_ analogy worksheet  Patient/Caregiver instruction/education:_ importance of completing HEP for carryover; pt verbalized comprehension.  (minutes: 5)    Pain Level (0-10 scale) post treatment: 0    ASSESSMENT    See Plan of Care    See progress note/recertification    Patient will continue to benefit from skilled therapy to address remaining functional deficits: _ aphasia    Progress towards goals / Updated goals:_  Patient presented to therapy  initiating review of her completed HEP packet. Pt was able to describe HEP answers with a notable decrease in instances of conduite d'approache; pt needed maximum verbal cues and multiple models to correct instances of semantic/phonemic paraphasias (x9). During delayed recall memory tasks, patient was able to recall ~89% of listed terms (sets of 3). During detail memory recall, patient was able to recall 60% of information when cued to use visualization and rehearsal memory enhancing technique. Pt continues motivated by ST; continue POC as indicated.    PLAN    Upgrade activities as tolerated       Continue plan of care    Discharge due to:__   Other:__    1. Patient will continue to participate in diagnostic procedures to further assess deficits within 6 treatment sessions.  2. Patient will demonstrate increased expressive language skills by performing  simple sentence completion tasks with 80% accuracy with min assist.  3. Patient will answer simple questions and perform responsive naming tasks and simple picture description task with 80% accuracy with min-mod assist.  4. Patient will demonstrate improved ability to use compensatory communication strategies related to recognizing when her message was successful and decreased circumlocution once her message is conveyed, with mod visual/verbal cues.    Prudencio PairRebecca A Zens 07/08/2013  10:11 AM  SLP Intern for   Tamsen Sniderossana Cardoso, MS CCC-SLP

## 2013-07-22 NOTE — Progress Notes (Signed)
ST DAILY TREATMENT NOTE    Patient Name: Salia Cangemi  Date:07/22/2013  DOB: 1955/10/28    Patient DOB Verified  Payor: [place] [employer] [employer] [facility] / Plan: [facility] / Product Type: Managed Care Medicare /   In time:1000  Out time:1030  Total Treatment Time (min): 30  Total Timed Codes (min): 30  1:1 Treatment Time (MC only): 30   Visit #: 6 of 6    SUBJECTIVE  Pain Level (0-10 scale): 0  Any medication changes, allergies to medications, adverse drug reactions, diagnosis change, or new procedure performed?:  No     Yes (see summary sheet for update)  Subjective functional status/changes:    No changes reported  "I'm doing all right today."    OBJECTIVE  Treatment provided includes:__  Increase/Improve:    Voice Location manager   Auditory Comprehension   Oral Motor Skills     Reading Comprehension   Expressive Language   Safety Awareness     Breath Support/Coord.   Articulation      Decrease:    Dysphagia   Apraxia   Dysphonia     Dysarthria   Dysfluency   Cognitive Ling. Deficit     Aphasia       Patient was able to:   SLP reviewed HEP word naming task; pt able to complete analogies with 75% accuracy when given maximum cues. During memory recall task, pt able to list 2/3 words in 3 trials when using chaining technique; 2-3/4 in 2 trials when given maximum cues and prompts. Pt held conversation with notable decrease in conduite d'approache; patient needed moderate cues and models to correct instances of semantic/phonemic paraphasias (x6) throughout the session.      Patient Education:  Review HEP     Progressed/Changed HEP based on:_  HEP/Handouts given:_ 3-4 word recall activity  Patient/Caregiver instruction/education:_ importance of completing HEP for carryover; pt verbalized comprehension.  (minutes: 5)    Pain Level (0-10 scale) post treatment: 0     ASSESSMENT    See Plan of Care    See progress note/recertification    Patient will continue to benefit from skilled therapy to address remaining functional deficits: _ Expressive language    Progress towards goals / Updated goals:_  Pt continues to make slow but steady gains. Pt continues to be motivated during ST sessions. Continue POC as indicated.     PLAN    Upgrade activities as tolerated       Continue plan of care    Discharge due to:__   Other:__     1. Patient will continue to participate in diagnostic procedures to further assess deficits within 6 treatment sessions.  2. Patient will demonstrate increased expressive language skills by performing simple sentence completion tasks with 80% accuracy with min assist.  3. Patient will answer simple questions and perform responsive naming tasks and simple picture description task with 80% accuracy with min-mod assist.  4. Patient will demonstrate improved ability to use compensatory communication strategies related to recognizing when her message was successful and decreased circumlocution once her message is conveyed, with mod visual/verbal cues.    Prudencio Pair Zens 07/22/2013  11:25 AM  SLP Intern for   Tamsen Snider, MS CCC-SLP

## 2013-07-24 NOTE — Progress Notes (Signed)
ST DAILY TREATMENT NOTE    Patient Name: Wanda Doyle  Date:07/24/2013  DOB: November 28, 1955    Patient DOB Verified  Payor: [place] [employer] [employer] [facility] [facility] / [facility]: [facility] / Product Type: Managed Care Medicare /    In time:1015  Out time:1030  Total Treatment Time (min): 15  Total Timed Codes (min): 15  1:1 Treatment Time (MC only): 15   Visit #: 7 of 7    SUBJECTIVE  Pain Level (0-10 scale): 0  Any medication changes, allergies to medications, adverse drug reactions, diagnosis change, or new procedure performed?:  No     Yes (see summary sheet for update)  Subjective functional status/changes:    No changes reported  "I'm doing fine."    OBJECTIVE  Treatment provided includes:__  Increase/Improve:    Voice Location managerQuality   Eating/Swallowing Skills    Writing Skills     Vocal Loudness   Cognitive Linguistic Skills   Speech Intelligibility     Fluency   Auditory Comprehension   Oral Motor Skills     Reading Comprehension   Expressive Language   Safety Awareness     Breath Support/Coord.   Articulation      Decrease:    Dysphagia   Apraxia   Dysphonia     Dysarthria   Dysfluency   Cognitive Ling. Deficit     Aphasia       Patient was able to:    Patient was able to complete divergent naming task independently with 50% accuracy. Patient able to complete opposite naming task with 61% accuracy given semantic cues on 3/23 targeted words, otherwise independently.        Patient Education:  Review HEP     Progressed/Changed HEP based on:_  HEP/Handouts given:_ Opposite naming task.  Patient/Caregiver instruction/education:_ importance of completing HEP for carryover; pt verbalized comprehension.  (minutes: 5)    Pain Level (0-10 scale) post treatment: 0    ASSESSMENT    See Plan of Care    See progress note/recertification    Patient will continue to benefit from skilled therapy to address remaining functional deficits: _ Aphasia     Progress towards goals / Updated goals: Patient making steady progress. Continues motivated by speech therapy. Continue POC as indicated.    PLAN    Upgrade activities as tolerated       Continue plan of care    Discharge due to:__   Other:__    1. Patient will continue to participate in diagnostic procedures to further assess deficits within 6 treatment sessions.  2. Patient will demonstrate increased expressive language skills by performing simple sentence completion tasks with 80% accuracy with min assist.  3. Patient will answer simple questions and perform responsive naming tasks and simple picture description task with 80% accuracy with min-mod assist.  4. Patient will demonstrate improved ability to use compensatory communication strategies related to recognizing when her message was successful and decreased circumlocution once her message is conveyed, with mod visual/verbal cues.    Rodney LangtonRebecca A Zens 07/24/2013  12:28 PM  SLP Intern for   Tamsen Sniderossana Cardoso, MS CCC-SLP

## 2013-07-25 NOTE — Telephone Encounter (Signed)
Spoke with patient and reiterated that Dr. Yetta Barre will not rx more narcotics from this office. Pt should call pain management physician to schedule appointment.

## 2013-07-25 NOTE — Telephone Encounter (Signed)
Pt states ran out of pain meds was not able to go to the pain manangement appt. States had a seizure at home and was not able to go the hospital and could not call. States need more pain medication until September. Related msg from Nurses stateting that she will not be able to get anymore pain meds from this office. Pt states does not understand why she cannot get anymore pain meds for her back.

## 2013-07-25 NOTE — Telephone Encounter (Signed)
Per Dr. Yetta Barre 06/23/13 refill encounter, pain medication will not be dispensed from this office. Will send to provider.

## 2013-07-25 NOTE — Telephone Encounter (Signed)
Requested Prescriptions     Pending Prescriptions Disp Refills   ??? megestrol (MEGACE) 400 mg/10 mL (40 mg/mL) suspension  3   ??? HYDROcodone-acetaminophen (NORCO) 5-325 mg per tablet 12 Tab 0     Sig: Take 1 Tab by mouth every four (4) hours as needed for Pain. Max Daily Amount: 6 Tabs.

## 2013-07-25 NOTE — Telephone Encounter (Signed)
No longer prescribing narcotics from this office.

## 2013-07-25 NOTE — Telephone Encounter (Signed)
Requested Prescriptions     Pending Prescriptions Disp Refills   ??? megestrol (MEGACE) 400 mg/10 mL (40 mg/mL) suspension  3

## 2013-07-25 NOTE — Telephone Encounter (Signed)
Notified pt that narcotics will not be filled at this office. Encouraged pt to call pain management to see if she can get an earlier appointment. Spoke with Casimiro Needle, and repeated the information. Both understood and will make appointment that is scheduled for august.

## 2013-07-28 MED ORDER — MEGESTROL 40 MG/ML ORAL SUSP
400 mg/10 mL (40 mg/mL) | Freq: Two times a day (BID) | ORAL | Status: DC
Start: 2013-07-28 — End: 2013-10-30

## 2013-07-28 NOTE — Telephone Encounter (Signed)
Requested Prescriptions     Pending Prescriptions Disp Refills   ??? megestrol (MEGACE) 400 mg/10 mL (40 mg/mL) suspension  3        Pt also requested "her pain medicine"  I see that per Steffi's last note no more narcotics from this office, I told her that, she indicated that she understood.

## 2013-07-29 NOTE — Progress Notes (Signed)
ST DAILY TREATMENT NOTE    Patient Name: Wanda Doyle  Date:07/29/2013  DOB: February 14, 1955    Patient DOB Verified  Payor: [place] [employer] [facility] / [facility]: [facility] / Product Type: Managed Care Medicare /   In time:1015  Out time:1045  Total Treatment Time (min): 30  Total Timed Codes (min): 30  1:1 Treatment Time (MC only): n/a   Visit #: 8 of 8    SUBJECTIVE  Pain Level (0-10 scale): 0  Any medication changes, allergies to medications, adverse drug reactions, diagnosis change, or new procedure performed?:  No     Yes (see summary sheet for update)  Subjective functional status/changes:    No changes reported  "Sometimes I can talk, sometimes not"    OBJECTIVE  Treatment provided includes:__  Increase/Improve:    Voice Audiological scientistQuality   Eating/Swallowing Skills    Writing Skills     Vocal Loudness   Cognitive Linguistic Skills   Speech Intelligibility     Fluency   Auditory Comprehension   Oral Motor Skills     Reading Comprehension   Expressive Language   Safety Awareness     Breath Support/Coord.   Articulation      Decrease:    Dysphagia   Apraxia   Dysphonia     Dysarthria   Dysfluency   Cognitive Ling. Deficit     Aphasia       Patient was able to:  Demonstrate increased expressive language skills by performing simple sentence completion tasks with 70% accuracy with min assist.   Answer simple questions and perform responsive naming tasks and simple picture description task with 80% accuracy with min-mod assist.  Use compensatory communication strategies related to recognizing when her message was successful and decreased circumlocution once her message is conveyed, with mod-max visual/verbal cues.   Complete divergent naming tasks with 70% acc, mod A.    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 Strong encouragement necessary for use of compensatory strategies outside of therapy session; pt responds "I don't have a lot of people to talk to". Pt further reports that her deficits are apparent more in person vs with phone calls; pt reports she is better understood when communicating over the phone.     PLAN    Upgrade activities as tolerated       Continue plan of care    Discharge due to:__   Other:__    1. Patient will continue to participate in diagnostic procedures to further assess deficits within 6 treatment sessions.  2. Patient will demonstrate increased expressive language skills by performing simple sentence completion tasks with 80% accuracy with min assist.  3. Patient will answer simple questions and perform responsive naming tasks and simple picture description task with 80% accuracy with min-mod assist.  4. Patient will demonstrate improved ability to use compensatory communication strategies related to recognizing when her message was successful  and decreased circumlocution once her message is conveyed, with mod visual/verbal cues.    Felecia Jan, SLP 07/29/2013  10:26 AM

## 2013-08-05 NOTE — Progress Notes (Addendum)
ST DAILY TREATMENT NOTE    Patient Name: Wanda Doyle  Date:08/05/2013  DOB: 03/15/1955    Patient DOB Verified  Payor: [place] [employer] / Plan: [facility] [place] [employer] [facility] / Product Type: Managed Care Medicare /   In time:1000  Out time:1030  Total Treatment Time (min): 30  Total Timed Codes (min): 30  1:1 Treatment Time (MC only): n/a   Visit #: 9 of 9    SUBJECTIVE  Pain Level (0-10 scale): 0  Any medication changes, allergies to medications, adverse drug reactions, diagnosis change, or new procedure performed?:  No     Yes (see summary sheet for update)  Subjective functional status/changes:    No changes reported  "I feel like I still can't speak well"    OBJECTIVE  Treatment provided includes:__  Increase/Improve:    Voice Location managerQuality   Eating/Swallowing Skills    Writing Skills     Vocal Loudness   Cognitive Linguistic Skills   Speech Intelligibility     Fluency   Auditory Comprehension   Oral Motor Skills     Reading Comprehension   Expressive Language   Safety Awareness     Breath Support/Coord.   Articulation      Decrease:    Dysphagia   Apraxia   Dysphonia     Dysarthria   Dysfluency   Cognitive Ling. Deficit     Aphasia       Patient was able to:  Patient was able to immediately recall 3/5 details from a picture; 5 minute delay patient able to recall 5/6 details. Patient was able complete complex divergent naming task with 40% accuracy; abstract naming task with 29% accuracy independently.    Patient Education:  Review HEP     Progressed/Changed HEP based on:_  HEP/Handouts given:_ Continue HEP from prior sessions  Patient/Caregiver instruction/education:_ importance of completing HEP for carryover; pt verbalized comprehension.  (minutes: 5)    Pain Level (0-10 scale) post treatment: 0    ASSESSMENT    See Plan of Care    See progress note/recertification    Patient will continue to benefit from skilled therapy to address remaining functional deficits: _ Aphasia     Progress towards goals / Updated goals:_ Patient continues to make slow and steady improvement in therapy given multimodal cues from clinician. Patient continues motivated by speech therapy. Continue POC as indicated.    PLAN    Upgrade activities as tolerated       Continue plan of care    Discharge due to:__   Other:__    1. Patient will continue to participate in diagnostic procedures to further assess deficits within 6 treatment sessions. (completed 08/05/13)  2. Patient will demonstrate increased expressive language skills by performing simple sentence completion tasks with 80% accuracy with min assist.  3. Patient will answer simple questions and perform responsive naming tasks and simple picture description task with 80% accuracy with min-mod assist.  4. Patient will demonstrate improved ability to use compensatory communication strategies related to recognizing when her message was successful and decreased circumlocution once her message is conveyed, with mod visual/verbal cues.    Prudencio PairRebecca A Zens 08/05/2013  11:21 AM  SLP Intern for   Tamsen Sniderossana Cardoso, MS CCC-SLP

## 2013-08-07 NOTE — Progress Notes (Signed)
ST DAILY TREATMENT NOTE    Patient Name: Wanda Doyle  Date:08/07/2013  DOB: [birth year]    Patient DOB Verified  Payor: [place] [employer] / Plan[place] [facility] [place] [employer] [facility] / Product Type: Managed Care Medicare /   In time:1000  Out time:1030  Total Treatment Time (min): 30  Total Timed Codes (min): 30  1:1 Treatment Time (MC only): n/a   Visit #: 10 of 10    SUBJECTIVE  Pain Level (0-10 scale): 0  Any medication changes, allergies to medications, adverse drug reactions, diagnosis change, or new procedure performed?:  No     Yes (see summary sheet for update)  Subjective functional status/changes:    No changes reported  "I am doing okay today"    OBJECTIVE  Treatment provided includes:__  Increase/Improve:    Voice Location manager   Auditory Comprehension   Oral Motor Skills     Reading Comprehension   Expressive Language   Safety Awareness     Breath Support/Coord.   Articulation      Decrease:    Dysphagia   Apraxia   Dysphonia     Dysarthria   Dysfluency   Cognitive Ling. Deficit     Aphasia       Patient was able to:  Patient was able to label concrete categories with 60% accuracy given x2 verbal cues; abstract categories with 80% accuracy given x3 max verbal cues. Patient was able to deduce item with 3 word descriptions with 70% accuracy with x2 verbal cues.    Patient Education:  Review HEP     Progressed/Changed HEP based on:_  HEP/Handouts given:_ Item deduction exercises.  Patient/Caregiver instruction/education:_ importance of completing HEP for carryover; pt verbalized comprehension.  (minutes: 5)    Pain Level (0-10 scale) post treatment: 0    ASSESSMENT    See Plan of Care    See progress note/recertification    Patient will continue to benefit from skilled therapy to address remaining functional deficits: _ aphasia     Progress towards goals / Updated goals:_ Patient making steady gains with verbal cues to visualize and take extra time with word retrieval. Patient continues motivated by speech therapy. Continue POC as indicated.    PLAN    Upgrade activities as tolerated       Continue plan of care    Discharge due to:__   Other:__    1. Patient will continue to participate in diagnostic procedures to further assess deficits within 6 treatment sessions. (completed 08/05/13)  2. Patient will demonstrate increased expressive language skills by performing simple sentence completion tasks with 80% accuracy with min assist.  3. Patient will answer simple questions and perform responsive naming tasks and simple picture description task with 80% accuracy with min-mod assist.  4. Patient will demonstrate improved ability to use compensatory communication strategies related to recognizing when her message was successful and decreased circumlocution once her message is conveyed, with mod visual/verbal cues.    Rodney Langton 08/07/2013  12:48 PM  SLP Intern for   Tamsen Snider, MS CCC-SLP

## 2013-08-08 LAB — METABOLIC PANEL, COMPREHENSIVE
A-G Ratio: 0.8 (ref 0.8–1.7)
ALT (SGPT): 22 U/L (ref 13–56)
AST (SGOT): 27 U/L (ref 15–37)
Albumin: 3.7 g/dL (ref 3.4–5.0)
Alk. phosphatase: 52 U/L (ref 45–117)
Anion gap: 12 mmol/L (ref 3.0–18)
BUN/Creatinine ratio: 10 — ABNORMAL LOW (ref 12–20)
BUN: 8 MG/DL (ref 7.0–18)
Bilirubin, total: 0.5 MG/DL (ref 0.2–1.0)
CO2: 19 mmol/L — ABNORMAL LOW (ref 21–32)
Calcium: 8.6 MG/DL (ref 8.5–10.1)
Chloride: 108 mmol/L (ref 100–108)
Creatinine: 0.78 MG/DL (ref 0.6–1.3)
GFR est AA: >60 mL/min/{1.73_m2} (ref >60)
GFR est non-AA: >60 mL/min/{1.73_m2} (ref >60)
Globulin: 4.8 g/dL — ABNORMAL HIGH (ref 2.0–4.0)
Glucose: 66 mg/dL — ABNORMAL LOW (ref 74–99)
Potassium: 4.1 mmol/L (ref 3.5–5.5)
Protein, total: 8.5 g/dL — ABNORMAL HIGH (ref 6.4–8.2)
Sodium: 139 mmol/L (ref 136–145)

## 2013-08-08 LAB — CBC WITH AUTOMATED DIFF
ABS. BASOPHILS: 0 10*3/uL (ref 0.0–0.1)
ABS. EOSINOPHILS: 0.2 10*3/uL (ref 0.0–0.4)
ABS. LYMPHOCYTES: 2.5 10*3/uL (ref 0.9–3.6)
ABS. MONOCYTES: 0.4 10*3/uL (ref 0.05–1.2)
ABS. NEUTROPHILS: 2.4 10*3/uL (ref 1.8–8.0)
BASOPHILS: 0 % (ref 0–2)
EOSINOPHILS: 4 % (ref 0–5)
HCT: 36.3 % (ref 35.0–45.0)
HGB: 12.4 g/dL (ref 12.0–16.0)
LYMPHOCYTES: 45 % (ref 21–52)
MCH: 29.1 PG (ref 24.0–34.0)
MCHC: 34.2 g/dL (ref 31.0–37.0)
MCV: 85.2 FL (ref 74.0–97.0)
MONOCYTES: 8 % (ref 3–10)
MPV: 9.3 FL (ref 9.2–11.8)
NEUTROPHILS: 43 % (ref 40–73)
PLATELET: 250 10*3/uL (ref 135–420)
RBC: 4.26 M/uL (ref 4.20–5.30)
RDW: 14.4 % (ref 11.6–14.5)
WBC: 5.5 10*3/uL (ref 4.6–13.2)

## 2013-08-08 LAB — MAGNESIUM: Magnesium: 1.9 mg/dL (ref 1.8–2.4)

## 2013-08-08 LAB — CK: CK: 119 U/L (ref 26–192)

## 2013-08-08 MED ORDER — HYDROMORPHONE (PF) 1 MG/ML IJ SOLN
1 mg/mL | INTRAMUSCULAR | Status: AC
Start: 2013-08-08 — End: 2013-08-08
  Administered 2013-08-08: 15:00:00 via INTRAVENOUS

## 2013-08-08 MED FILL — HYDROMORPHONE (PF) 1 MG/ML IJ SOLN: 1 mg/mL | INTRAMUSCULAR | Qty: 1

## 2013-08-08 NOTE — ED Notes (Signed)
Pt arrived via EMS in stable condition without distress. Pt with Left hand PIV #20gauge. EMS also stated pt was given 325mg  of baby ASA and EKG done.

## 2013-08-08 NOTE — ED Notes (Signed)
I have reviewed discharge instructions with the patient.  The patient verbalized understanding. Disc instruction sheet given to pt. Pt's IV d/c cather intact and site without any problems. Friend with patient taking pt home. Pt leaving in stable condition without distress or c/o

## 2013-08-08 NOTE — ED Provider Notes (Signed)
HPI Comments: Wanda Doyle is a 58 y.o. female who presents to the ED C/O pain in her right side. Pt states that she had a CVA in December 2014 and has had waxing and waning pain in her back and all along her right side. Pt states that she is not sure what exacerbates or relieves her pain. Pt has R side weakness and difficulty speaking from the CVA and is in therapy. PT states that her doctor has her on pain medication for this right sided pain. Pt denies abdominal pain and any other Sx.        Past Medical History   Diagnosis Date   ??? Seizures (HCC)    ??? Hypertension    ??? Stroke [facility])         Past Surgical History   Procedure Laterality Date   ??? Hx hysterectomy           Family History   Problem Relation Age of Onset   ??? Family history unknown: Yes        History     Social History   ??? Marital Status: SINGLE     Spouse Name: N/A     Number of Children: N/A   ??? Years of Education: N/A     Occupational History   ??? Not on file.     Social History Main Topics   ??? Smoking status: Light Tobacco Smoker     Types: Cigarettes   ??? Smokeless tobacco: Never Used   ??? Alcohol Use: No      Comment: drinks a beer every once in a while   ??? Drug Use: No   ??? Sexual Activity: Not on file     Other Topics Concern   ??? Not on file     Social History Narrative                  ALLERGIES: Penicillins      Review of Systems   Constitutional: Negative for fever and chills.   HENT: Negative for sore throat.    Respiratory: Negative for cough, shortness of breath and wheezing.    Cardiovascular: Negative for chest pain and palpitations.   Gastrointestinal: Negative for nausea, vomiting, abdominal pain, diarrhea and abdominal distention.   Genitourinary: Negative for dysuria and urgency.   Musculoskeletal: Positive for back pain.        See HPI.   Skin: Negative for rash.   Neurological: Positive for speech difficulty and weakness. Negative for dizziness, syncope, facial asymmetry and headaches.    All other systems reviewed and are negative.      There were no vitals filed for this visit.         Physical Exam   Constitutional: She is oriented to person, place, and time. She appears well-developed.   HENT:   Head: Normocephalic and atraumatic.   Eyes: EOM are normal. Pupils are equal, round, and reactive to light.   Neck: Normal range of motion. Neck supple.   Cardiovascular: Normal rate, regular rhythm and normal heart sounds.  Exam reveals no friction rub.    No murmur heard.  Pulmonary/Chest: Effort normal and breath sounds normal. No respiratory distress. She has no wheezes.   Abdominal: Soft. She exhibits no distension. There is no tenderness. There is no rebound and no guarding.   Musculoskeletal: Normal range of motion.   Neurological: She is alert and oriented to person, place, and time.   Patient with a prior CVA essentially  unable to lift up the right leg or lower left leg.  Unable to plantar flex either foot.  Strength is approximately 4-5 in each upper arm,     cranial nerves II through XII are grossly intact  Except patient does have slurred speech.       Skin: Skin is warm and dry.   Psychiatric: She has a normal mood and affect. Her behavior is normal. Thought content normal.        MDM  Number of Diagnoses or Management Options  Diagnosis management comments: 230 58-year-old female presents with pain.  Reportedly since her last stroke she's had diffuse right-sided pain that radiates up and down her right side occasionally shooting up into the left side.  This waxes and wanes over time.  She states that she's been given pain medication by her primary care doctor and has been referred to physical therapy as well as sees Drs. Approximately 2-3 times a week for her continued care.      Etiology is unclear however given duration of low likelihood of a  Problem requiring immediate medical or surgical intervention, will check basic labs and reevaluate       Patient feels better symptoms have been going on intermittently for approximately one year will refer back to pcp.   11:58 AM Patient is feeling better.      Procedures    Scribe Attestation:   August 08, 2013 at 9:54 AM - Janet BerlinGeorge Tiefenback scribing for and in the presence of Dr.Jalesha Plotz Lorie ApleyE Kimisha Eunice, MD     Janet BerlinGeorge Tiefenback, Scribe      Provider Attestation:   I personally performed the services described in the documentation, reviewed the documentation, as recorded by the scribe in my presence, and it accurately and completely records my words and actions. August 08, 2013 at 12:04 PM - Algis DownsMichael E. Raynelle Fujikawa, MD    '

## 2013-08-11 NOTE — Telephone Encounter (Signed)
NO NARCOTICS ARE TO BE WRITTEN FROM THIS OFFICE. AS PREVIOUSLY STATED, WILL ADDRESS ALL OTHER MEDICAL ISSUES EXCEPT PAIN CONTROL.

## 2013-08-11 NOTE — Telephone Encounter (Signed)
Notified pt of decision. Pt stated that she understood.

## 2013-08-11 NOTE — Telephone Encounter (Signed)
Pt states went to the hospital on Friday and is requesting pain medication. States does not know the name of the hospital. Pt is very hard to understand. Advised will give relate msg.

## 2013-08-11 NOTE — Telephone Encounter (Signed)
Pt is requesting pain medication, pt went to ER on 08/08/13.

## 2013-08-19 NOTE — Progress Notes (Signed)
[facility] ??? [facility]  [address] [address], [place], [place] [id number] - Phone: [phone]  Fax: [phone]  PLAN OF CARE / STATEMENT OF MEDICAL NECESSITY FOR PHYSICAL THERAPY SERVICES  Patient Name: Wanda Doyle DOB: 1955/03/29   Medical   Diagnosis: Lumbago [724.2]  Unspecified cerebral artery occlusion with cerebral infarction [facility](HCC) [434.91] Treatment Diagnosis: Chronic LBP   Onset Date: 12/2012     Referral Source: Daryll DrownJones, Eric L, MD Start of Care [facility](SOC): 08/19/2013   Prior Hospitalization: See medical history Provider #: [id number]   Prior Level of Function: independent    Comorbidities: Depression, osteoporosis, arthritis, HTN, asthma, Hepatitis C, alcohol and tobacco use, visual impairment   Medications: Verified on Patient Summary List   The Plan of Care and following information is based on the information from the initial evaluation.   ===========================================================================================  Assessment / key information:  Patient is a 58 y.o. year old female with chief complaint of LBP.  Patient has aphasia which could contribute to her difficulty providing a history.  She reports back pain began after her CVA in 12/2012.  She cannot provide information on any activities that increase or decrease pain.  Patient with decreased trunk AROM, decreased flexibility in B LE's (R>L), decreased strength in B LE's, and decreased core strength (evidenced by + form.force closure test).   Patient with a Functional Status score of 49 on FOTO (Focused on Therapeutic Outcomes), which corresponds to a functional limitation of 51%.  Patient will benefit from skilled physical therapy services to address these issues.  ===========================================================================================  Problem List: pain affecting function, decrease ROM, decrease strength,  impaired gait/ balance, decrease ADL/ functional abilitiies, decrease activity tolerance, decrease flexibility/ joint mobility and decrease transfer abilities   Treatment Plan may include any combination of the following: Therapeutic exercise, Therapeutic activities, Neuromuscular re-education, Physical agent/modality, Gait/balance training, Manual therapy and Patient education  Patient / Family readiness to learn indicated by: asking questions, trying to perform skills and interest  Persons(s) to be included in education: patient (P)  Barriers to Learning/Limitations: no  Measures taken:    Patient Goal (s): "decrease pain"   Patient self reported health status: fair  Rehabilitation Potential: good  ? Short Term Goals: To be accomplished in  2-3  weeks:  1.  Patient will be compliant with home exercise program.    ? Long Term Goals: To be accomplished in  4-6  weeks:  1.  Patient will increase FOTO Functional Status score to 57 to indicate decreased functional limitations.  2.  Patient will report ability to stand for 30 minutes with LBP < 5/10 to increase activity tolerance.  3.  Patient will be independent with home exercise program for self management of symptoms.    Frequency / Duration:   Patient to be seen  2-3  times per week for 4-6  weeks:  Patient / Caregiver education and instruction: self care  G-Codes (GP): PositionG8981 Current  CK= 40-59%  G8982 Goal  CK= 40-59%.  The severity rating is based on the FOTO Score   Therapist Signature: Elsie Lincolnerrilyn  Dali Kraner, PT Date: 08/19/2013   Certification Period: 08/19/2013 - 11/18/2013 Time: 2:23 PM   ===========================================================================================  I certify that the above Physical Therapy Services are being furnished while the patient is under my care.  I agree with the treatment plan and certify that this therapy is necessary.    Physician Signature:        Date:  Time:      Please sign and return to [facility] or you may fax the signed copy to [phone].  Thank you.

## 2013-08-19 NOTE — Progress Notes (Signed)
PHYSICAL THERAPY - DAILY TREATMENT NOTE    Patient Name: Wanda Doyle        Date: 08/19/2013  DOB: [birth year]   YES Patient DOB Verified  Visit #:   1   of   8-12  Insurance: Payor: [place] [employer] / Plan[place] [facility] [place] [employer] [employer] [facility] [facility] / Product Type: Managed Care Medicare /      In time: 9:20 Out time: 10:00   Total Treatment Time: 40     Medicare Time Tracking (below)   Total Timed Codes (min):  NA 1:1 Treatment Time:  NA     TREATMENT AREA =  L/S    SUBJECTIVE    Pain Level (on 0 to 10 scale):  9  / 10   Medication Changes/New allergies or changes in medical history, any new surgeries or procedures?    NO    If yes, update Summary List   Subjective Functional Status/Changes:    No changes reported     Reports LBP and R LE pain since her CVA in 12/2012.  States she has been to the ER for LBP 2-3 times.  Patient has expressive aphasia.  States the pain is constant in nature.  Patient is a poor historian and it is difficult to determine any activities that aggravate or alleviate pain.  States walking sometimes makes it worse. Reports pain in her upper back and pain in her R arm.  States intermittent numbness in R LE and R UE.  States she is currently living in a motel (1st floor).            OBJECTIVE  LOW BACK EVALUATION  Objective:      Gait: wide BOS with R LE held in an abducted position; antalgic limp on R LE    Palpation/Sensation:increased muscle turgor noted in B lumbar paraspinals    (N - normal; R - reduced; MR - markedly reduced)       L/S ROM      Range   Effect  Strength (MMT)          Right        Left    Flex 50% p! Psoas (L2,3) 3 3   Ext 10% p! Quads (L3) 3 5   R-lat flex 25% p! Ant tib(L4) 3 3   L-lat flex 25% p! Ext Hall (L4,L5)     R-rot   Glut Med(L5) 3 3   L-rot   Peroneals (L5,S1)        Hamstrings(S1,S2) 2 3     Strength (MMT)       Right     Left          Gastroc (S1,S2)     Glut Max (S1,S2) 2 3        Core: Sup Bridge     Core: Side Bridge      Core: Prone plank            Special Tests                       Right     Left          Flexibility         Right              Left    Slump   90/90 HS -45 -35   SLR   Ely     Form/Force closure  + + USAA  Sl screen 3/5=70% LR   Ober     Gaenslen test   Hip IR (prone)     Patrick/FABER sign   Hip ER (prone)     Thigh thrust        Distraction        Lateral Compression                  Effect of:  DKC    Supine Bridge    SL Supine Bridge    Prone on Elbows Increase R LE pain   RFIS    REIL          SI Symmetry:    Standing        Supine            Prone                Dynamic      +/- R/L  ASIS    Fwd Flex    IC level   Stork    PSIS level   Seated FF        Other Objective/Functional Measures:    See eval     Post Treatment Pain Level (on 0 to 10) scale:   9  / 10     ASSESSMENT    Assessment/Changes in Function:     See eval       See Progress Note/Recertification   Patient will continue to benefit from skilled PT services to modify and progress therapeutic interventions, address functional mobility deficits, address ROM deficits, address strength deficits, analyze and address soft tissue restrictions, analyze and cue movement patterns, analyze and modify body mechanics/ergonomics, assess and modify postural abnormalities and instruct in home and community integration to attain remaining goals.     Progress toward goals / Updated goals:    Goals established.      PLAN      Upgrade activities as tolerated YES Continue plan of care     Discharge due to :      Other:      Therapist: Elsie Lincoln, PT    Date: 08/19/2013 Time: 9:30 AM     Future Appointments  Date Time Provider Department Center   08/19/2013 10:30 AM Felecia Jan, SLP [facility]   08/20/2013 12:00 PM Daryll Drown, MD [facility]ED   08/21/2013 10:30 AM Lottie Rater [facility]   08/21/2013 11:00 AM Felecia Jan, SLP [facility]   08/28/2013 10:30 AM Sherlyn Lick, PT [facility]    08/28/2013 11:00 AM Felecia Jan, SLP [facility]   09/02/2013 9:30 AM Felecia Jan, SLP [facility]   09/02/2013 10:00 AM Lottie Rater [facility]   09/04/2013 9:30 AM Lottie Rater [facility]   09/04/2013 10:00 AM Felecia Jan, SLP [facility]

## 2013-08-19 NOTE — Progress Notes (Signed)
ST DAILY TREATMENT NOTE    Patient Name: Wanda Doyle  Date:08/19/2013  DOB: October 19, 1955    Patient DOB Verified  Payor: [employer] / Plan: [facility] [employer] / Product Type: Managed Care Medicare /   In time:1030  Out time:1100  Total Treatment Time (min): 30  Total Timed Codes (min): 30  1:1 Treatment Time ([facility] only): n/a   Visit #: 11 of 11    SUBJECTIVE  Pain Level (0-10 scale): 0  Any medication changes, allergies to medications, adverse drug reactions, diagnosis change, or new procedure performed?:  No     Yes (see summary sheet for update)  Subjective functional status/changes:    No changes reported  "I just can't speak".     OBJECTIVE  Treatment provided includes:__  Increase/Improve:    Voice Location managerQuality   Eating/Swallowing Skills    Writing Skills     Vocal Loudness   Cognitive Linguistic Skills   Speech Intelligibility     Fluency   Auditory Comprehension   Oral Motor Skills     Reading Comprehension   Expressive Language   Safety Awareness     Breath Support/Coord.   Articulation      Decrease:    Dysphagia   Apraxia   Dysphonia     Dysarthria   Dysfluency   Cognitive Ling. Deficit     Aphasia       Patient was able to:  1. demonstrate increased expressive language skills by performing simple sentence completion tasks: 70% multimodal cues  2. simple picture description task, providing 3 descriptions, with 80% accuracy with min A.  3. demonstrate improved ability to use compensatory communication strategies related to recognizing when her message was successful: 50% max A    Patient Education:  Review HEP     Progressed/Changed HEP based on:_  HEP/Handouts given:_continue HEP as indicated  Patient/Caregiver instruction/education:_importance of ID when message is not being rec'd by communication partner without getting frustrated with partner. Needs strong reinforcement.  (minutes: 5)    Pain Level (0-10 scale) post treatment: 0    ASSESSMENT     See Plan of Care    See progress note/recertification    Patient will continue to benefit from skilled therapy to address remaining functional deficits: _aphasia/apraxia/dysfluency    Progress towards goals / Updated goals:_Pt appears to be reaching plateau across all therapy goals; however, pt does not feel her speech is "where it should be". It was noted during therapy sessions, as well as with front desk staff, that when pt becomes frustrated with communication partner when message is not clearly received, pt's fluency and errors increase. However, during therapy session and with SLP, pt able to effectively communicate/describe situations in pictures. SLP educated pt at length re: importance of ID'ing when message is not being rec'd by communication partner, in order for pt to re-assess utterance and execution. Pt verbalized comprehension and in agreement with SLP education. Continue 1-3 visits to address effective communication as pt appears to be reaching plateau.    PLAN    Upgrade activities as tolerated       Continue plan of care    Discharge due to:__   Other:__    1. Patient will continue to participate in diagnostic procedures to further assess deficits within 6 treatment sessions. (completed 08/05/13)   2. Patient will demonstrate increased expressive language skills by performing simple sentence completion tasks with 80% accuracy with min assist.   3.  Patient will answer simple questions and perform responsive naming tasks and simple picture description task with 80% accuracy with min-mod assist.   4. Patient will demonstrate improved ability to use compensatory communication strategies related to recognizing when her message was successful and decreased circumlocution once her message is conveyed, with mod visual/verbal cues.    Felecia Jan, SLP 08/19/2013  1:19 PM

## 2013-08-20 MED ORDER — GABAPENTIN 300 MG CAP
300 mg | ORAL_CAPSULE | ORAL | Status: DC
Start: 2013-08-20 — End: 2013-09-09

## 2013-08-20 NOTE — Progress Notes (Signed)
1. Have you been to the ER, urgent care clinic since your last visit?  Hospitalized since your last visit? [facility], c/o pain    2. Have you seen or consulted any other health care providers outside of the [facility] since your last visit?  Include any pap smears or colon screening. Yes, physical Therapy

## 2013-08-20 NOTE — Progress Notes (Signed)
Internal Medicine  Progress Note  Today's Date:  08/20/2013   Patient:  Wanda Doyle  Patient DOB:  08/08/1955    Subjective:     Chief Complaint   Patient presents with   ??? Pain (Chronic)     Follow up        HPI: Wanda Doyle is a 58 y.o. African American female who presents for scheduled f/u, Pt still with complaint of LBP. Has experienced some improvement with PT.    ROS:   General: negative for - chills, fatigue, fever, weight change  Resp: negative for - cough, shortness of breath, or wheezing  CV: negative for - chest pain, edema or palpitations  GI: negative for - abdominal pain, change in bowel habits, constipation, diarrhea or nausea/vomiting  GU: negative for - dysuria,frequency, urgency, hematuria, incontinence.  Neuro: negative for -  seizures or weakness  Psych: negative for - anxiety, depression, irritability or mood swings,suicidal, or homicidal ideation      Past Medical History   Diagnosis Date   ??? Seizures ([place])    ??? Hypertension    ??? Stroke [facility])      Past Surgical History   Procedure Laterality Date   ??? Hx hysterectomy           Current Outpatient Meds and Allergies     Current Outpatient Prescriptions   Medication Sig Dispense Refill   ??? megestrol (MEGACE) 400 mg/10 mL (40 mg/mL) suspension Take 10 mL by mouth two (2) times a day. 480 mL 3   ??? HYDROcodone-acetaminophen (NORCO) 5-325 mg per tablet Take 1 Tab by mouth every four (4) hours as needed for Pain. Max Daily Amount: 6 Tabs. 12 Tab 0   ??? ribavirin (RIBASPHERE) 200 mg tablet Take 200 mg by mouth two (2) times a day. Take two tablets in the am and two tablets in the pm by mouth     ??? sofosbuvir (SOVALDI) 400 mg tab Take  by mouth.     ??? methocarbamol (ROBAXIN) 500 mg tablet Take 1 Tab by mouth four (4) times daily. 20 Tab 0   ??? albuterol (PROVENTIL HFA, VENTOLIN HFA, PROAIR HFA) 90 mcg/actuation inhaler Take 2 Puffs by inhalation every six (6) hours as needed for Wheezing. 1 Inhaler 6    ??? albuterol sulfate (PROVENTIL;VENTOLIN) 2.5 mg/0.5 mL nebu nebulizer solution 0.5 mL by Nebulization route every four (4) hours as needed for Wheezing. 20 mL 11   ??? simvastatin (ZOCOR) 40 mg tablet Take 40 mg by mouth nightly.     ??? omeprazole (PRILOSEC) 20 mg capsule Take 20 mg by mouth daily.     ??? hydrochlorothiazide (HYDRODIURIL) 25 mg tablet Take 25 mg by mouth daily.     ??? levETIRAcetam (KEPPRA) 500 mg tablet Take  by mouth two (2) times a day.     ??? potassium chloride (KLOR-CON) 20 mEq packet Take 20 mEq by mouth daily.     ??? aspirin 81 mg chewable tablet 162 mg.           Allergies   Allergen Reactions   ??? Penicillins Nausea and Vomiting          Objective:       BP Readings from Last 3 Encounters:   08/20/13 141/96   08/08/13 131/70   06/23/13 139/88     VS:  BP 141/96 mmHg   Pulse 92   Temp(Src) 97.7 ??F (36.5 ??C) (Oral)   Resp 18   Ht 5' 5"  (  1.651 m)   Wt 108 lb 3.2 oz (49.079 kg)   BMI 18.01 kg/m2   SpO2 98%    Body mass index is 18.01 kg/(m^2).  General:   W/D, W/N, well-groomed,  pleasant, in no acute distress. Remains dysarthric.   Head:  NCAT,                                  Eyes:             PERRLA,EOMI, sclera clear, conjunctiva pink.  Cardiovasc:   Regular rate and rhythm, no murmurs, no rubs, no gallops.  Pulmonary:    Normal respiratory effort, good air movement, no wheezing, rales, rhonchi.   Extremities:   No clubbing,cyanosis, edema.  Neuro:   A&Ox3, conversant, appropriate.    Results for orders placed or performed during the hospital encounter of 08/08/13   CBC WITH AUTOMATED DIFF   Result Value Ref Range    WBC 5.5 4.6 - 13.2 K/uL    RBC 4.26 4.20 - 5.30 M/uL    HGB 12.4 12.0 - 16.0 g/dL    HCT 36.3 35.0 - 45.0 %    MCV 85.2 74.0 - 97.0 FL    MCH 29.1 24.0 - 34.0 PG    MCHC 34.2 31.0 - 37.0 g/dL    RDW 14.4 11.6 - 14.5 %    PLATELET 250 135 - 420 K/uL    MPV 9.3 9.2 - 11.8 FL    NEUTROPHILS 43 40 - 73 %    LYMPHOCYTES 45 21 - 52 %    MONOCYTES 8 3 - 10 %    EOSINOPHILS 4 0 - 5 %     BASOPHILS 0 0 - 2 %    ABS. NEUTROPHILS 2.4 1.8 - 8.0 K/UL    ABS. LYMPHOCYTES 2.5 0.9 - 3.6 K/UL    ABS. MONOCYTES 0.4 0.05 - 1.2 K/UL    ABS. EOSINOPHILS 0.2 0.0 - 0.4 K/UL    ABS. BASOPHILS 0.0 0.0 - 0.1 K/UL    DF AUTOMATED     METABOLIC PANEL, COMPREHENSIVE   Result Value Ref Range    Sodium 139 136 - 145 mmol/L    Potassium 4.1 3.5 - 5.5 mmol/L    Chloride 108 100 - 108 mmol/L    CO2 19 (L) 21 - 32 mmol/L    Anion gap 12 3.0 - 18 mmol/L    Glucose 66 (L) 74 - 99 mg/dL    BUN 8 7.0 - 18 MG/DL    Creatinine 0.78 0.6 - 1.3 MG/DL    BUN/Creatinine ratio 10 (L) 12 - 20      GFR est AA >60 >60 ml/min/1.39m    GFR est non-AA >60 >60 ml/min/1.770m   Calcium 8.6 8.5 - 10.1 MG/DL    Bilirubin, total 0.5 0.2 - 1.0 MG/DL    ALT 22 13 - 56 U/L    AST 27 15 - 37 U/L    Alk. phosphatase 52 45 - 117 U/L    Protein, total 8.5 (H) 6.4 - 8.2 g/dL    Albumin 3.7 3.4 - 5.0 g/dL    Globulin 4.8 (H) 2.0 - 4.0 g/dL    A-G Ratio 0.8 0.8 - 1.7     CK   Result Value Ref Range    CK 119 26 - 192 U/L   MAGNESIUM   Result Value Ref Range    Magnesium 1.9  1.8 - 2.4 mg/dL       Assessment/Plan & Orders:     [name] was seen today for pain (chronic).    Diagnoses and associated orders for this visit:    Chronic LBP    Dysarthria due to cerebrovascular accident  Comments: Speech has improved.    Healthcare maintenance  - MAM MAMMO BI SCREENING DIGTL; Future    Other Orders  - gabapentin (NEURONTIN) 300 mg capsule; One tab at bedtime for 5 days, then one tab twice daily.          Follow-up Disposition:  Return in about 2 weeks (around 09/03/2013) for RE-EVALUATION.   Reviewed with patient the treatment plan, goals of treatment plan, and limitations of treatment plan, to include the potential for side effects from medications and procedures. If side effects occur, it is the responsibility of the patient to inform the clinic so that a change in the treatment plan can be made in a safe manner.       Patient verbalized understanding and is in agreement with treatment plan as outlined above.  All questions answered.        Charlett Nose MD  [facility]  Ph - [phone]  Fax [phone]

## 2013-08-27 ENCOUNTER — Encounter

## 2013-08-29 NOTE — Telephone Encounter (Signed)
Please read message below

## 2013-08-29 NOTE — Telephone Encounter (Signed)
Pt states the medication neurontin in not working.  Does not know what to do with them.

## 2013-09-01 NOTE — Telephone Encounter (Signed)
Please instruct Pt to take 1 tab tid. F/U as scheduled.

## 2013-09-01 NOTE — Telephone Encounter (Signed)
Spoke with patient in regards to note below

## 2013-09-04 NOTE — Progress Notes (Signed)
[facility]  [address]  [place], [place] [address]  Ph: [phone] Fax: [phone]     Speech Pathology -- Discharge Summary    Name: Wanda Doyle        Date: 09/04/2013   DOB: [birth year]   MD: Daryll Drown, MD    Treatment Diagnosis: Aphasia   Start of Care: 06/10/13    Visits from Start of Care: 14  Missed Visits: 3    Summary of Care: Pt was being seen for expressive aphasia, making minimal gains. However, pt has been unable to attend last four scheduled visits as a result of difficulty with transportation. Please note that out pt clinic staff has attempted to assist pt with transportation issues; albeit unsuccessful. Due to the inability to further her care from non-attendance, we are discharging the patient from speech therapy at this time. SLP called and discussed with pt and pt's dtr; all parties agreeable to d/c.     Assessment / Recommendations:   Discontinue therapy due to lack of attendance or compliance.    Felecia Jan, SLP 09/04/2013 10:22 AM    ________________________________________________________________________    NOTE TO PHYSICIAN:  Please complete the following and fax to:   [facility] at  [facility] at [phone]    . Retain this original for your records.  If you are unable to process this request in   24 hours, please contact our office.     ____ I have read the above report and request that my patient continue therapy with the following changes/special instructions:  ____ I have read the above report and request that my patient be discharged from therapy    Physician's Signature:_____________________ Date:___________Time:__________

## 2013-09-09 MED ORDER — GABAPENTIN 300 MG CAP
300 mg | ORAL_CAPSULE | ORAL | Status: DC
Start: 2013-09-09 — End: 2013-11-04

## 2013-09-09 NOTE — Patient Instructions (Signed)
TAKE GABAPENTIN, 300 MG TABLETS 3 TIMES A DAY    Hepatitis C: After Your Visit  Your Care Instructions  Hepatitis C is an infection of the liver caused by a virus. This virus spreads when blood or body fluids from an infected person enter another person's body. This occurs most often when people share needles that have the virus on them. In the past, people got the virus through blood transfusions and organ transplants. But since 1992, all donated blood and organs have been screened for hepatitis C. So getting the virus this way is now very rare.  Less often, hepatitis C can spread through sex and sharing items such as razor blades or toothbrushes. Needles used for tattoos and body piercings can also spread the virus.  The virus doesn't always cause symptoms. But you may feel tired. And you may have a headache, sore muscles, nausea, and pain in the upper right belly. Other symptoms include yellowish skin and dark urine. Home treatment can help ease symptoms. And your doctor may prescribe antiviral medicine.  Long-term infection can lead to severe liver damage. So make sure to go to your follow-up appointments.  Follow-up care is a key part of your treatment and safety. Be sure to make and go to all appointments, and call your doctor if you are having problems. It's also a good idea to know your test results and keep a list of the medicines you take.  How can you care for yourself at home?  ?? Be safe with medicines. If your doctor prescribes antiviral medicine, take it exactly as prescribed. Call your doctor if you think you are having a problem with your medicine.  ?? Do not drink alcohol. Alcohol can damage the liver. Tell your doctor if you need help to quit. Counseling, support groups, and sometimes medicines can help you stay sober.  ?? Do not take drugs or herbal medicines. They can make liver problems worse.  ?? Make sure your doctor knows all of the medicines you take. Some  medicines, such as acetaminophen (Tylenol), can make liver problems worse. Do not take any new medicines unless your doctor tells you to. This includes over-the-counter medicines.  ?? Maintain a healthy lifestyle. Get plenty of exercise if you feel up to it. Eat a healthy diet.  ?? Drink plenty of fluids, enough so that your urine is light yellow or clear like water. If you have kidney, heart, or liver disease and have to limit fluids, talk with your doctor before you increase the amount of fluids you drink.  ?? Get the vaccines (if you have not already) to protect yourself from hepatitis A and hepatitis B, influenza, and pneumococcus.  ?? The infection can make you itch. Keep cool and stay out of the sun. Try to wear cotton clothing. Talk to your doctor about using over-the-counter medicines for itching. These include diphenhydramine (Benadryl) and chlorpheniramine (Chlor-Trimeton). Follow the instructions on the label.  ?? If you feel depressed, talk to your doctor about treatment. Many people who have long-term illnesses get depressed. Keep in mind that antiviral medicine can make depression worse.  To avoid spreading hepatitis C to others  ?? Tell the people that you live with or have sex with about your illness as soon as you can.  ?? Don't share needles to inject drugs. Don't share other equipment (such as cotton, spoons, and water) with others. Find out if a needle exchange program is available in your area, and use it. Get into  a drug treatment program.  ?? Practice safer sex. Reduce your number of sex partners if you have more than one. Unless you are in a long-term relationship in which neither partner has sex with anyone else, always use latex condoms when you have sex.  ?? Don't donate blood or blood products, organs, semen, or eggs (ova).  ?? Make sure that all equipment is sterilized if you get a tattoo, have your body pierced, or have acupuncture.   ?? Do not share your personal items. These include razors, toothbrushes, towels, and nail files.  ?? Tell your doctor, dentist, and anyone else who may come in contact with your blood about your illness.  ?? Prevent others from coming in contact with your blood and other body fluids. Keep any cuts, scrapes, or blisters covered.  ?? Wash your hands???and any object that has come in contact with your blood???thoroughly with water and soap.  When should you call for help?  Call 911 anytime you think you may need emergency care. For example, call if:  ?? You passed out (lost consciousness).  ?? You have severe trouble breathing.  ?? You feel very confused and can't think clearly.  ?? You vomit blood or what looks like coffee grounds.  ?? You pass maroon or very bloody stools.  Call your doctor now or seek immediate medical care if:  ?? You have any trouble breathing.  ?? You have new bruises or blood spots under your skin.  ?? Your stools are black and tarlike or have streaks of blood.  ?? You have signs of needing more fluids. You have sunken eyes and a dry mouth, and you pass only a little dark urine.  Watch closely for changes in your health, and be sure to contact your doctor if:  ?? You have a nosebleed.  ?? Your gums bleed when you brush your teeth.   Where can you learn more?   Go to [employer]  Enter [id number] in the search box to learn more about "Hepatitis C: After Your Visit."   ?? 2006-2015 Healthwise, Incorporated. Care instructions adapted under license by [employer] (which disclaims liability or warranty for this information). This care instruction is for use with your licensed healthcare professional. If you have questions about a medical condition or this instruction, always ask your healthcare professional. [place] any warranty or liability for your use of this information.  Content Version: 10.5.422740; Current as of: November 22, 2012               Learning About Hepatitis C  What is hepatitis C?     Hepatitis C is a liver infection. It is caused by the hepatitis C virus. The virus is spread through infected blood and body fluids.  Hepatitis C is often spread when a person shares infected needles used to inject illegal drugs. It also can be spread if a person uses a needle that has infected blood on it. This could happen when you get a tattoo or piercing. Or it can happen when you get a shot in some developing countries where they use needles more than once to give shots.  In rare cases, a mother with hepatitis C can spread the virus to her baby at birth. Or a health care worker may accidentally be exposed to blood that is infected with hepatitis C.  Experts are not sure if you can get hepatitis C through sexual contact. If there is a risk of getting the virus through  sexual contact, it is very small. The risk is higher if your sex partner has hepatitis C or if you have many sex partners.  You can't get hepatitis C from casual contact. This is contact such as hugging, kissing, sneezing, coughing, and sharing food or drinks.  What happens when you have hepatitis C?  Some people who get hepatitis C have it for a short time and then get better. This is called acute hepatitis C.  But most people get long-term, or chronic, hepatitis C. This can lead to liver damage as well as cirrhosis, liver cancer, and liver failure.  Experts recommend that certain groups of people get tested for the virus. These include people who have signs of liver disease, have ever shared needles while using illegal drugs, or have had many sex partners. Ask your doctor if testing is right for you.  You can also buy a home test called a Home Access Hepatitis C Check kit at most drugstores. If the test shows that you have been exposed to the virus in the past, be sure to talk to your doctor to find out if you have the virus now.  What are the symptoms?   Most people who get hepatitis C do not have symptoms at first. Symptoms may include:  ?? Tiredness.  ?? Headache.  ?? Sore muscles.  ?? Nausea.  ?? Pain in the upper right belly.  ?? Yellowing of your skin and eyes (jaundice).  ?? Dark urine.  How can you prevent hepatitis C?  There is no vaccine to prevent the disease. Anyone who has hepatitis C can spread the virus to someone else. You can take steps to make infection less likely.  ?? Do not share needles to inject drugs.  ?? Follow safety guidelines if you work in a health care setting. Wear protective gloves and clothing. Dispose of needles and other sharp objects properly.  ?? Make sure all instruments and supplies are sterilized if you get a tattoo, have your body pierced, or have acupuncture.  To avoid spreading hepatitis C if you have it:  ?? Do not share needles or other equipment, such as cotton, spoons, and water, if you use needles to inject drugs.  ?? Keep cuts, scrapes, and blisters covered. This will prevent others from coming in contact with your blood and other body fluids. Throw out any blood-soaked items such as used bandages.  ?? Do not donate blood or sperm.  ?? Wash your hands and anything that has come in contact with your blood. Use soap and water.  ?? Do not share your toothbrush, razor, nail clippers, or anything else that might have your blood on it.  How is hepatitis C treated?  ?? If you have acute hepatitis C, your doctor will probably prescribe medicine.  ?? If you have chronic hepatitis C, your treatment depends on whether you have liver damage, other health problems you may have, and how much virus is in your body and what type it is.  ?? You will need to see your doctor regularly to have blood tests to check your liver.  Follow-up care is a key part of your treatment and safety. Be sure to make and go to all appointments, and call your doctor if you are having problems. It's also a good idea to know your test results and keep a list  of the medicines you take.   Where can you learn more?   Go to [employer]  Enter C666 in the  search box to learn more about "Learning About Hepatitis C."   ?? 2006-2015 Healthwise, Incorporated. Care instructions adapted under license by [employer] (which disclaims liability or warranty for this information). This care instruction is for use with your licensed healthcare professional. If you have questions about a medical condition or this instruction, always ask your healthcare professional. [place] any warranty or liability for your use of this information.  Content Version: 10.5.422740; Current as of: June 12, 2012              Stopping Smoking: After Your Visit  Your Care Instructions  Cigarette smokers crave the nicotine in cigarettes. Giving it up is much harder than simply changing a habit. Your body has to stop craving the nicotine. It is hard to quit, but you can do it. There are many tools that people use to quit smoking. You may find that combining tools works best for you.  There are several steps to quitting. First you get ready to quit. Then you get support to help you. After that, you learn new skills and behaviors to become a nonsmoker. For many people, a necessary step is getting and using medicine.  Your doctor will help you set up the plan that best meets your needs. You may want to attend a smoking cessation program to help you quit smoking. When you choose a program, look for one that has proven success. Ask your doctor for ideas. You will greatly increase your chances of success if you take medicine as well as get counseling or join a cessation program.  Some of the changes you feel when you first quit tobacco are uncomfortable. Your body will miss the nicotine at first, and you may feel short-tempered and grumpy. You may have trouble sleeping or concentrating. Medicine can help you deal with these symptoms. You may struggle with  changing your smoking habits and rituals. The last step is the tricky one: Be prepared for the smoking urge to continue for a time. This is a lot to deal with, but keep at it. You will feel better.  Follow-up care is a key part of your treatment and safety. Be sure to make and go to all appointments, and call your doctor if you are having problems. It???s also a good idea to know your test results and keep a list of the medicines you take.  How can you care for yourself at home?  ?? Ask your family, friends, and coworkers for support. You have a better chance of quitting if you have help and support.  ?? Join a support group, such as Nicotine Anonymous, for people who are trying to quit smoking.  ?? Consider signing up for a smoking cessation program, such as the American Lung Association's Freedom from Smoking program.  ?? Set a quit date. Pick your date carefully so that it is not right in the middle of a big deadline or stressful time. Once you quit, do not even take a puff. Get rid of all ashtrays and lighters after your last cigarette. Clean your house and your clothes so that they do not smell of smoke.  ?? Learn how to be a nonsmoker. Think about ways you can avoid those things that make you reach for a cigarette.  ?? Avoid situations that put you at greatest risk for smoking. For some people, it is hard to have a drink with friends without smoking. For others, they might skip a coffee break with coworkers who smoke.  ??  Change your daily routine. Take a different route to work or eat a meal in a different place.  ?? Cut down on stress. Calm yourself or release tension by doing an activity you enjoy, such as reading a book, taking a hot bath, or gardening.  ?? Talk to your doctor or pharmacist about nicotine replacement therapy, which replaces the nicotine in your body. You still get nicotine but you do not use tobacco. Nicotine replacement products help you slowly reduce  the amount of nicotine you need. These products come in several forms, many of them available over-the-counter:  ?? Nicotine patches  ?? Nicotine gum and lozenges  ?? Nicotine inhaler  ?? Ask your doctor about bupropion (Wellbutrin) or varenicline (Chantix), which are prescription medicines. They do not contain nicotine. They help you by reducing withdrawal symptoms, such as stress and anxiety.  ?? Some people find hypnosis, acupuncture, and massage helpful for ending the smoking habit.  ?? Eat a healthy diet and get regular exercise. Having healthy habits will help your body move past its craving for nicotine.  ?? Be prepared to keep trying. Most people are not successful the first few times they try to quit. Do not get mad at yourself if you smoke again. Make a list of things you learned and think about when you want to try again, such as next week, next month, or next year.   Where can you learn more?   Go to [employer].com.cy  Enter Y522 in the search box to learn more about "Stopping Smoking: After Your Visit."   ?? 2006-2015 Healthwise, Incorporated. Care instructions adapted under license by [employer] (which disclaims liability or warranty for this information). This care instruction is for use with your licensed healthcare professional. If you have questions about a medical condition or this instruction, always ask your healthcare professional. [place] any warranty or liability for your use of this information.  Content Version: 10.5.422740; Current as of: September 17, 2012

## 2013-09-09 NOTE — Progress Notes (Signed)
1. Have you been to the ER, urgent care clinic since your last visit?  Hospitalized since your last visit?No    2. Have you seen or consulted any other health care providers outside of the [facility] since your last visit?  Include any pap smears or colon screening. No

## 2013-09-09 NOTE — Progress Notes (Signed)
Internal Medicine  Progress Note  Today's Date:  09/09/2013   Patient:  Wanda Doyle  Patient DOB:  May 12, 1955    Subjective:     Chief Complaint   Patient presents with   ??? Medication Evaluation     follow up on new medication        HPI: Wanda Doyle is a 58 y.o. African American female who presents for scheduled f/u, states pain symptoms are improved to some degree on gabapentin. Otherwise no new complaints. States she continues to smoke several cigars weekly. Is being followed by GI for her Hep C, which is presently stable.    ROS:   General: negative for - chills, fatigue, fever, patient with intentional weight gain.  ENT: negative for - headaches, sneezing or sore throat  Resp: negative for - cough, sputum production, hemoptysis, shortness of breath, or wheezing  CV: negative for - chest pain, edema or palpitations  GI: negative for - abdominal pain, change in bowel habits, constipation, diarrhea or nausea/vomiting  GU: negative for - dysuria,frequency, urgency, hematuria, incontinence  Neuro: negative for - confusion, seizures.  Psych: negative for - anxiety, depression, irritability or mood swings,suicidal, or homicidal ideation.      Past Medical History   Diagnosis Date   ??? Seizures ([place])    ??? Hypertension    ??? Stroke [facility])      Past Surgical History   Procedure Laterality Date   ??? Hx hysterectomy           Current Outpatient Meds and Allergies     Current Outpatient Prescriptions   Medication Sig Dispense Refill   ??? gabapentin (NEURONTIN) 300 mg capsule Take one tab po tid  Indications: NEUROPATHIC PAIN 90 Cap 2   ??? megestrol (MEGACE) 400 mg/10 mL (40 mg/mL) suspension Take 10 mL by mouth two (2) times a day. 480 mL 3   ??? ribavirin (RIBASPHERE) 200 mg tablet Take 200 mg by mouth two (2) times a day. Take two tablets in the am and two tablets in the pm by mouth     ??? sofosbuvir (SOVALDI) 400 mg tab Take  by mouth.     ??? albuterol (PROVENTIL HFA, VENTOLIN HFA, PROAIR HFA) 90 mcg/actuation  inhaler Take 2 Puffs by inhalation every six (6) hours as needed for Wheezing. 1 Inhaler 6   ??? albuterol sulfate (PROVENTIL;VENTOLIN) 2.5 mg/0.5 mL nebu nebulizer solution 0.5 mL by Nebulization route every four (4) hours as needed for Wheezing. 20 mL 11   ??? simvastatin (ZOCOR) 40 mg tablet Take 40 mg by mouth nightly.     ??? omeprazole (PRILOSEC) 20 mg capsule Take 20 mg by mouth daily.     ??? hydrochlorothiazide (HYDRODIURIL) 25 mg tablet Take 25 mg by mouth daily.     ??? levETIRAcetam (KEPPRA) 500 mg tablet Take  by mouth two (2) times a day.     ??? potassium chloride (KLOR-CON) 20 mEq packet Take 20 mEq by mouth daily.     ??? aspirin 81 mg chewable tablet 162 mg.     ??? methocarbamol (ROBAXIN) 500 mg tablet Take 1 Tab by mouth four (4) times daily. 20 Tab 0         Allergies   Allergen Reactions   ??? Penicillins Nausea and Vomiting          Objective:       BP Readings from Last 3 Encounters:   09/09/13 125/80   08/20/13 141/96   08/08/13 131/70  VS:  BP 125/80 mmHg   Pulse 81   Temp(Src) 98 ??F (36.7 ??C) (Oral)   Resp 18   Ht 5' 5"  (1.651 m)   Wt 110 lb 9.6 oz (50.168 kg)   BMI 18.40 kg/m2   SpO2 98%    Body mass index is 18.4 kg/(m^2).  General:   W/D, W/N, well-groomed,  pleasant, in no acute distress. Remains markedly dysarthric     Head:  NCAT,                                  Neck:              Supple with normal ROM for age, no adenopathy, thyromegaly.  Cardiovasc:   Regular rate and rhythm, no murmurs, no rubs, no gallops.  Pulmonary:    Normal respiratory effort, good air movement, no wheezing, rales, rhonchi.   Extremities:   No clubbing,cyanosis, edema.  Neuro:   A&Ox3, conversant, appropriate.    Results for orders placed or performed during the hospital encounter of 08/08/13   CBC WITH AUTOMATED DIFF   Result Value Ref Range    WBC 5.5 4.6 - 13.2 K/uL    RBC 4.26 4.20 - 5.30 M/uL    HGB 12.4 12.0 - 16.0 g/dL    HCT 36.3 35.0 - 45.0 %    MCV 85.2 74.0 - 97.0 FL    MCH 29.1 24.0 - 34.0 PG     MCHC 34.2 31.0 - 37.0 g/dL    RDW 14.4 11.6 - 14.5 %    PLATELET 250 135 - 420 K/uL    MPV 9.3 9.2 - 11.8 FL    NEUTROPHILS 43 40 - 73 %    LYMPHOCYTES 45 21 - 52 %    MONOCYTES 8 3 - 10 %    EOSINOPHILS 4 0 - 5 %    BASOPHILS 0 0 - 2 %    ABS. NEUTROPHILS 2.4 1.8 - 8.0 K/UL    ABS. LYMPHOCYTES 2.5 0.9 - 3.6 K/UL    ABS. MONOCYTES 0.4 0.05 - 1.2 K/UL    ABS. EOSINOPHILS 0.2 0.0 - 0.4 K/UL    ABS. BASOPHILS 0.0 0.0 - 0.1 K/UL    DF AUTOMATED     METABOLIC PANEL, COMPREHENSIVE   Result Value Ref Range    Sodium 139 136 - 145 mmol/L    Potassium 4.1 3.5 - 5.5 mmol/L    Chloride 108 100 - 108 mmol/L    CO2 19 (L) 21 - 32 mmol/L    Anion gap 12 3.0 - 18 mmol/L    Glucose 66 (L) 74 - 99 mg/dL    BUN 8 7.0 - 18 MG/DL    Creatinine 0.78 0.6 - 1.3 MG/DL    BUN/Creatinine ratio 10 (L) 12 - 20      GFR est AA >60 >60 ml/min/1.3m    GFR est non-AA >60 >60 ml/min/1.717m   Calcium 8.6 8.5 - 10.1 MG/DL    Bilirubin, total 0.5 0.2 - 1.0 MG/DL    ALT 22 13 - 56 U/L    AST 27 15 - 37 U/L    Alk. phosphatase 52 45 - 117 U/L    Protein, total 8.5 (H) 6.4 - 8.2 g/dL    Albumin 3.7 3.4 - 5.0 g/dL    Globulin 4.8 (H) 2.0 - 4.0 g/dL    A-G Ratio 0.8 0.8 - 1.7  CK   Result Value Ref Range    CK 119 26 - 192 U/L   MAGNESIUM   Result Value Ref Range    Magnesium 1.9 1.8 - 2.4 mg/dL       Assessment/Plan & Orders:     Yaffa was seen today for medication evaluation.    Diagnoses and associated orders for this visit:    Chronic LBP  Comments: Increase gabapentin from 345m bid to 300 mg tid.    Essential hypertension  - METABOLIC PANEL, COMPREHENSIVE    Tobacco use disorder  Comments: Pt still smoking, again discussed smoking cessation.    [place]  Comments: Stable    Chronic hepatitis C without hepatic coma (HCC)  - METABOLIC PANEL, COMPREHENSIVE    History of metabolic alkalosis  - METABOLIC PANEL, COMPREHENSIVE    Other Orders  - gabapentin (NEURONTIN) 300 mg capsule; Take one tab po tid  Indications: NEUROPATHIC PAIN           Follow-up Disposition:  Return in about 8 weeks (around 11/04/2013) for RE-EVALUATION.   Reviewed with patient the treatment plan, goals of treatment plan, and limitations of treatment plan, to include the potential for side effects from medications and procedures. If side effects occur, it is the responsibility of the patient to inform the clinic so that a change in the treatment plan can be made in a safe manner.      Patient verbalized understanding and is in agreement with treatment plan as outlined above.  All questions answered.        ECharlett NoseMD  [facility]  Ph - [phone] Fax [phone]

## 2013-09-10 LAB — METABOLIC PANEL, COMPREHENSIVE
A-G Ratio: 1.4 (ref 1.1–2.5)
ALT (SGPT): 11 IU/L (ref 0–32)
AST (SGOT): 17 IU/L (ref 0–40)
Albumin: 4.6 g/dL (ref 3.6–4.8)
Alk. phosphatase: 40 IU/L (ref 39–117)
BUN/Creatinine ratio: 16 (ref 11–26)
BUN: 16 mg/dL (ref 8–27)
Bilirubin, total: 1 mg/dL (ref 0.0–1.2)
CO2: 21 mmol/L (ref 18–29)
Calcium: 9.8 mg/dL (ref 8.7–10.3)
Chloride: 100 mmol/L (ref 97–108)
Creatinine: 1.01 mg/dL — ABNORMAL HIGH (ref 0.57–1.00)
GFR est AA: 70 mL/min/{1.73_m2} (ref >59)
GFR est non-AA: 61 mL/min/{1.73_m2} (ref >59)
GLOBULIN, TOTAL: 3.2 g/dL (ref 1.5–4.5)
Glucose: 78 mg/dL (ref 65–99)
Potassium: 4.6 mmol/L (ref 3.5–5.2)
Protein, total: 7.8 g/dL (ref 6.0–8.5)
Sodium: 139 mmol/L (ref 134–144)

## 2013-09-10 NOTE — Progress Notes (Signed)
Quick Note:        Please inform Pt labs normal. Continue present management, f/u as scheduled, or PRN.    ______

## 2013-09-29 NOTE — Progress Notes (Signed)
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    The patient was scheduled for 12 visits.  They attended 1 of them and was last seen on 08/19/2013.    Patient did not return to PT after her initial evaluation.  She had to cancel several appointments due to transportation issues.    Feel she may be better suited for home health physical therapy instead of outpatient physical therapy due to transportation issues.       We appreciate the kind referral and would willingly work with this patient again, should they be able to arrange regular attendance.  Your patient's health is our primary concern.    Should you have any further questions or concerns, please feel free to contact me at your convenience.    Sincerely,     Elsie Lincoln, PT                   09/29/2013

## 2013-10-08 NOTE — Telephone Encounter (Signed)
Mr. Wanda Doyle called to inform us that pt has an [facility]  Their original appt needed to be RS because for insurance reasons, she had to wait 1 year.

## 2013-10-30 ENCOUNTER — Encounter

## 2013-10-30 MED ORDER — MEGESTROL 40 MG/ML ORAL SUSP
400 mg/10 mL (40 mg/mL) | Freq: Two times a day (BID) | ORAL | Status: DC
Start: 2013-10-30 — End: 2014-01-06

## 2013-10-30 NOTE — Telephone Encounter (Signed)
Requested Prescriptions     Pending Prescriptions Disp Refills   ??? megestrol (MEGACE) 400 mg/10 mL (40 mg/mL) suspension 480 mL 3     Sig: Take 10 mL by mouth two (2) times a day.       Pharmacy confirmed, pts husband states that this is the second time this week he has called ( I see no record)- he does not remember the name of the person whom he spoke to.  Pt is totally out of medication.  Please call them when we send script to pharmacy

## 2013-10-30 NOTE — Telephone Encounter (Signed)
Following up on refill request. Casimiro NeedleMichael stated called the pharmacy and not in yet. I told him the request is in the system once provider approves it, it will be sent to the pharmacy and they will call when ready for pick up. Stated understood.

## 2013-11-04 ENCOUNTER — Ambulatory Visit
Admit: 2013-11-04 | Discharge: 2013-11-04 | Payer: MEDICARE | Attending: Internal Medicine | Primary: Student in an Organized Health Care Education/Training Program

## 2013-11-04 DIAGNOSIS — I10 Essential (primary) hypertension: Secondary | ICD-10-CM

## 2013-11-04 MED ORDER — GABAPENTIN 300 MG CAP
300 mg | ORAL_CAPSULE | ORAL | Status: DC
Start: 2013-11-04 — End: 2014-01-19

## 2013-11-04 MED ORDER — NICOTINE 21 MG/24 HR DAILY PATCH
21 mg/24 hr | MEDICATED_PATCH | TRANSDERMAL | Status: AC
Start: 2013-11-04 — End: 2013-12-04

## 2013-11-04 MED ORDER — LISINOPRIL-HYDROCHLOROTHIAZIDE 10 MG-12.5 MG TAB
ORAL_TABLET | Freq: Every day | ORAL | Status: DC
Start: 2013-11-04 — End: 2014-03-12

## 2013-11-04 NOTE — Patient Instructions (Signed)
DASH Diet: After Your Visit  Your Care Instructions  The DASH diet is an eating plan that can help lower your blood pressure. DASH stands for Dietary Approaches to Stop Hypertension. Hypertension is high blood pressure.  The DASH diet focuses on eating foods that are high in calcium, potassium, and magnesium. These nutrients can lower blood pressure. The foods that are highest in these nutrients are fruits, vegetables, low-fat dairy products, nuts, seeds, and legumes. But taking calcium, potassium, and magnesium supplements instead of eating foods that are high in those nutrients does not have the same effect. The DASH diet also includes whole grains, fish, and poultry.  The DASH diet is one of several lifestyle changes your doctor may recommend to lower your high blood pressure. Your doctor may also want you to decrease the amount of sodium in your diet. Lowering sodium while following the DASH diet can lower blood pressure even further than just the DASH diet alone.  Follow-up care is a key part of your treatment and safety. Be sure to make and go to all appointments, and call your doctor if you are having problems. It's also a good idea to know your test results and keep a list of the medicines you take.  How can you care for yourself at home?  Following the DASH diet  ?? Eat 4 to 5 servings of fruit each day. A serving is 1 medium-sized piece of fruit, ?? cup chopped or canned fruit, 1/4 cup dried fruit, or 4 ounces (?? cup) of fruit juice. Choose fruit more often than fruit juice.  ?? Eat 4 to 5 servings of vegetables each day. A serving is 1 cup of lettuce or raw leafy vegetables, ?? cup of chopped or cooked vegetables, or 4 ounces (?? cup) of vegetable juice. Choose vegetables more often than vegetable juice.  ?? Get 2 to 3 servings of low-fat and fat-free dairy each day. A serving is 8 ounces of milk, 1 cup of yogurt, or 1 ?? ounces of cheese.   ?? Eat 6 to 8 servings of grains each day. A serving is 1 slice of bread, 1 ounce of dry cereal, or ?? cup of cooked rice, pasta, or cooked cereal. Try to choose whole-grain products as much as possible.  ?? Limit lean meat, poultry, and fish to 2 servings each day. A serving is 3 ounces, about the size of a deck of cards.  ?? Eat 4 to 5 servings of nuts, seeds, and legumes (cooked dried beans, lentils, and split peas) each week. A serving is 1/3 cup of nuts, 2 tablespoons of seeds, or ?? cup of cooked beans or peas.  ?? Limit fats and oils to 2 to 3 servings each day. A serving is 1 teaspoon of vegetable oil or 2 tablespoons of salad dressing.  ?? Limit sweets and added sugars to 5 servings or less a week. A serving is 1 tablespoon jelly or jam, ?? cup sorbet, or 1 cup of lemonade.  ?? Eat less than 2,300 milligrams (mg) of sodium a day. If you have high blood pressure, diabetes, or chronic kidney disease, if you are African-American, or if you are older than age 50, try to limit the amount of sodium you eat to less than 1,500 mg a day.  Tips for success  ?? Start small. Do not try to make dramatic changes to your diet all at once. You might feel that you are missing out on your favorite foods and then be more   likely to not follow the plan. Make small changes, and stick with them. Once those changes become habit, add a few more changes.  ?? Try some of the following:  ?? Make it a goal to eat a fruit or vegetable at every meal and at snacks. This will make it easy to get the recommended amount of fruits and vegetables each day.  ?? Try yogurt topped with fruit and nuts for a snack or healthy dessert.  ?? Add lettuce, tomato, cucumber, and onion to sandwiches.  ?? Combine a ready-made pizza crust with low-fat mozzarella cheese and lots of vegetable toppings. Try using tomatoes, squash, spinach, broccoli, carrots, cauliflower, and onions.  ?? Have a variety of cut-up vegetables with a low-fat dip as an appetizer  instead of chips and dip.  ?? Sprinkle sunflower seeds or chopped almonds over salads. Or try adding chopped walnuts or almonds to cooked vegetables.  ?? Try some vegetarian meals using beans and peas. Add garbanzo or kidney beans to salads. Make burritos and tacos with mashed pinto beans or black beans.   Where can you learn more?   Go to [email or web address]  Enter H967 in the search box to learn more about "DASH Diet: After Your Visit."   ?? 2006-2015 [employer]. Care instructions adapted under license by [employer] (which disclaims liability or warranty for this information). This care instruction is for use with your licensed healthcare professional. If you have questions about a medical condition or this instruction, always ask your healthcare professional. [employer], [employer] disclaims any warranty or liability for your use of this information.  Content Version: 10.5.422740; Current as of: February 28, 2013              Chronic Pain: After Your Visit  Your Care Instructions  Chronic pain is pain that lasts a long time (months or even years) and may or may not have a clear cause. It is different from acute pain, which usually does have a clear cause???like an injury or illness???and gets better over time. Chronic pain:  ?? Lasts over time but may vary from day to day.  ?? Does not go away despite efforts to end it.  ?? May disrupt your sleep and lead to fatigue.  ?? May cause depression or anxiety.  ?? May make your muscles tense, causing more pain.  ?? Can disrupt your work, hobbies, home life, and relationships with friends and family.  Chronic pain is a very real condition. It is not just in your head. Treatment can help and usually includes several methods used together, such as medicines, physical therapy, exercise, and other treatments. Learning how to relax and changing negative thought patterns can also help you cope.   Chronic pain is complex. Taking an active role in your treatment will help you better manage your pain. Tell your doctor if you have trouble dealing with your pain. You may have to try several things before you find what works best for you.  Follow-up care is a key part of your treatment and safety. Be sure to make and go to all appointments, and call your doctor if you are having problems. It???s also a good idea to know your test results and keep a list of the medicines you take.  How can you care for yourself at home?  ?? Pace yourself. Break up large jobs into smaller tasks. Save harder tasks for days when you have less pain, or go back and forth between hard tasks and easier  ones. Take rest breaks.  ?? Relax, and reduce stress. Relaxation techniques such as deep breathing or meditation can help.  ?? Keep moving. Gentle, daily exercise can help reduce pain over the long run. Try low- or no-impact exercises such as walking, swimming, and stationary biking. Do stretches to stay flexible.  ?? Try heat, cold packs, and massage.  ?? Get enough sleep. Chronic pain can make you tired and drain your energy. Talk with your doctor if you have trouble sleeping because of pain.  ?? Think positive. Your thoughts can affect your pain level. Do things that you enjoy to distract yourself when you have pain instead of focusing on the pain. See a movie, read a book, listen to music, or spend time with a friend.  ?? If you think you are depressed, talk to your doctor about treatment.  ?? Keep a daily pain diary. Record how your moods, thoughts, sleep patterns, activities, and medicine affect your pain. You may find that your pain is worse during or after certain activities or when you are feeling a certain emotion. Having a record of your pain can help you and your doctor find the best ways to treat your pain.  ?? Take pain medicines exactly as directed.  ?? If the doctor gave you a prescription medicine for pain, take it as prescribed.   ?? If you are not taking a prescription pain medicine, ask your doctor if you can take an over-the-counter medicine.  Reducing constipation caused by pain medicine  ?? Include fruits, vegetables, beans, and whole grains in your diet each day. These foods are high in fiber.  ?? Drink plenty of fluids, enough so that your urine is light yellow or clear like water. If you have kidney, heart, or liver disease and have to limit fluids, talk with your doctor before you increase the amount of fluids you drink.  ?? If your doctor recommends it, get more exercise. Walking is a good choice. Bit by bit, increase the amount you walk every day. Try for at least 30 minutes on most days of the week.  ?? Schedule time each day for a bowel movement. A daily routine may help. Take your time and do not strain when having a bowel movement.  When should you call for help?  Call your doctor now or seek immediate medical care if:  ?? Your pain gets worse or is out of control.  ?? You feel down or blue, or you do not enjoy things like you once did. You may be depressed, which is common in people with chronic pain. Depression can be treated.  ?? You have vomiting or cramps for more than 2 hours.  Watch closely for changes in your health, and be sure to contact your doctor if:  ?? You cannot sleep because of pain.  ?? You are very worried or anxious about your pain.  ?? You have trouble taking your pain medicine.  ?? You have any concerns about your pain medicine.  ?? You have trouble with bowel movements, such as:  ?? No bowel movement in 3 days.  ?? Blood in the anal area, in your stool, or on the toilet paper.  ?? Diarrhea for more than 24 hours.   Where can you learn more?   Go to [email or web address]  Enter N004 in the search box to learn more about "Chronic Pain: After Your Visit."   ?? 2006-2015 [employer], [employer]. Care instructions adapted under license by [employer] (which disclaims  liability or warranty for this  information). This care instruction is for use with your licensed healthcare professional. If you have questions about a medical condition or this instruction, always ask your healthcare professional. [employer], [employer] disclaims any warranty or liability for your use of this information.  Content Version: 10.5.422740; Current as of: February 28, 2013              Deciding About Using Medicines To Quit Smoking  What are the medicines you can use?  Your doctor may prescribe varenicline (Chantix) or bupropion (Zyban). These medicines can help you cope with cravings for tobacco. They are pills that don't contain nicotine.  You also can use nicotine replacement products. These do contain nicotine. There are many types.  ?? Gum and lozenges slowly release nicotine into your mouth.  ?? Patches stick to your skin. They slowly release nicotine into your bloodstream.  ?? An inhaler has a holder that contains nicotine. You breathe in a puff of nicotine vapor through your mouth and throat.  What are key points about this decision?  ?? Using medicines can double your chances of quitting smoking. They can ease cravings and withdrawal symptoms.  ?? Getting counseling along with using medicine can raise your chances of quitting even more.  ?? If you smoke fewer than 10 cigarettes a day, you may not need medicines to help you quit smoking.  ?? These medicines have less nicotine than cigarettes. And by itself, nicotine is not nearly as harmful as smoking. The tars, carbon monoxide, and other toxic chemicals in tobacco cause the harmful effects.  ?? The side effects of nicotine replacement products depend on the type of product. For example, a patch can make your skin red and itchy. Medicines in pill form can make you sick to your stomach. They can also cause dry mouth and trouble sleeping. For most people, the side effects are not bad enough to make them stop using the products.   ?? FDA warning. The U[place] Food and Drug Administration (FDA) warns that people who are taking bupropion or varenicline and who have any serious or unusual changes in mood or behavior or who feel like hurting themselves or someone else should stop taking the medicine and call a doctor right away. If you already have a mood or behavior problem, be sure to tell your doctor before you decide to use these medicines.  Why might you choose to use medicines to quit smoking?  ?? You have tried on your own to stop smoking, but you were not able to stop.  ?? You smoke more than 10 cigarettes a day.  ?? You want to increase your chances of quitting smoking.  ?? You want to reduce your cravings and withdrawal symptoms.  ?? You feel the benefits of medicine outweigh the side effects.  Why might you choose not to use medicine?  ?? You want to try quitting on your own by stopping all at once ("cold [place]").  ?? You want to cut back slowly on the number of cigarettes you smoke.  ?? You smoke fewer than 10 cigarettes a day.  ?? You do not like using medicine.  ?? You feel the side effects of medicines outweigh the benefits.  ?? You are worried about the cost of medicines.  Your decision  Thinking about the facts and your feelings can help you make a decision that is right for you. Be sure you understand the benefits and risks of your options, and think  about what else you need to do before you make the decision.   Where can you learn more?   Go to [email or web address].[email or web address].[email or web address]  Enter [phone])[id number] in the search box to learn more about "Deciding About Using Medicines To Quit Smoking."   ?? 2006-2015 [employer], [employer]. Care instructions adapted under license by [employer] (which disclaims liability or warranty for this information). This care instruction is for use with your licensed healthcare professional. If you have questions about a medical condition or this instruction, always ask your healthcare professional. [employer]wise,  [employer]corpor[employer] disclaims any warranty or liability for your use of this information.  Content Version: 10.5.422740; Current as of: September 17, 2012              Stopping Smoking: After Your Visit  Your Care Instructions  Cigarette smokers crave the nicotine in cigarettes. Giving it up is much harder than simply changing a habit. Your body has to stop craving the nicotine. It is hard to quit, but you can do it. There are many tools that people use to quit smoking. You may find that combining tools works best for you.  There are several steps to quitting. First you get ready to quit. Then you get support to help you. After that, you learn new skills and behaviors to become a nonsmoker. For many people, a necessary step is getting and using medicine.  Your doctor will help you set up the plan that best meets your needs. You may want to attend a smoking cessation program to help you quit smoking. When you choose a program, look for one that has proven success. Ask your doctor for ideas. You will greatly increase your chances of success if you take medicine as well as get counseling or join a cessation program.  Some of the changes you feel when you first quit tobacco are uncomfortable. Your body will miss the nicotine at first, and you may feel short-tempered and grumpy. You may have trouble sleeping or concentrating. Medicine can help you deal with these symptoms. You may struggle with changing your smoking habits and rituals. The last step is the tricky one: Be prepared for the smoking urge to continue for a time. This is a lot to deal with, but keep at it. You will feel better.  Follow-up care is a key part of your treatment and safety. Be sure to make and go to all appointments, and call your doctor if you are having problems. It???s also a good idea to know your test results and keep a list of the medicines you take.  How can you care for yourself at home?   ?? Ask your family, friends, and coworkers for support. You have a better chance of quitting if you have help and support.  ?? Join a support group, such as Nicotine Anonymous, for people who are trying to quit smoking.  ?? Consider signing up for a smoking cessation program, such as the American Lung Association's Freedom from Smoking program.  ?? Set a quit date. Pick your date carefully so that it is not right in the middle of a big deadline or stressful time. Once you quit, do not even take a puff. Get rid of all ashtrays and lighters after your last cigarette. Clean your house and your clothes so that they do not smell of smoke.  ?? Learn how to be a nonsmoker. Think about ways you can avoid those things that make you reach for a cigarette.  ?? Avoid situations that  put you at greatest risk for smoking. For some people, it is hard to have a drink with friends without smoking. For others, they might skip a coffee break with coworkers who smoke.  ?? Change your daily routine. Take a different route to work or eat a meal in a different place.  ?? Cut down on stress. Calm yourself or release tension by doing an activity you enjoy, such as reading a book, taking a hot bath, or gardening.  ?? Talk to your doctor or pharmacist about nicotine replacement therapy, which replaces the nicotine in your body. You still get nicotine but you do not use tobacco. Nicotine replacement products help you slowly reduce the amount of nicotine you need. These products come in several forms, many of them available over-the-counter:  ?? Nicotine patches  ?? Nicotine gum and lozenges  ?? Nicotine inhaler  ?? Ask your doctor about bupropion (Wellbutrin) or varenicline (Chantix), which are prescription medicines. They do not contain nicotine. They help you by reducing withdrawal symptoms, such as stress and anxiety.  ?? Some people find hypnosis, acupuncture, and massage helpful for ending the smoking habit.   ?? Eat a healthy diet and get regular exercise. Having healthy habits will help your body move past its craving for nicotine.  ?? Be prepared to keep trying. Most people are not successful the first few times they try to quit. Do not get mad at yourself if you smoke again. Make a list of things you learned and think about when you want to try again, such as next week, next month, or next year.   Where can you learn more?   Go to [email or web address]://www.[email or web address].[email or web address]  Enter Y522 in the search box to learn more about "Stopping Smoking: After Your Visit."   ?? 2006-2015 Healthwise, Inc[employer]. Care instructions adapted under license by [employer] [place] [employer] (which disclaims liability or warranty for this information). This care instruction is for use with your licensed healthcare professional. If you have questions about a medical condition or this instruction, always ask your healthcare professional. [employer], [employer] disclaims any warranty or liability for your use of this information.  Content Version: 10.5.422740; Current as of: September 17, 2012

## 2013-11-04 NOTE — Progress Notes (Signed)
Internal Medicine  Progress Note  Today's Date:  11/04/2013   Patient:  Wanda Doyle  Patient DOB:  12/23/1955    Subjective:     Chief Complaint   Patient presents with   ??? Hypertension     follow up   ??? Aphasia     pt would like someone to come out to her home   ??? Medication Refill        HPI: Wanda Doyle is a 58 y.o. African American female who presents for scheduled f/u, Pt presenting with her significant other. Pt recently discharged from Pain Management due to testing positive for cocaine, continues to complain of LBP. Pt requesting re-evaluation by Speech Therapy. She continues to smoke 1 ppd. Pt never got MRI of L-spine done. Is to f/u with GI to re-evaluate Hep C status.    ROS:   General: negative for - chills, fatigue, fever, intentional weight gain.  ENT: negative for - headaches, sneezing or sore throat  Resp: negative for - cough, shortness of breath, or wheezing  CV: negative for - chest pain, edema or palpitations.  Heme/ Lymph: negative for - bleeding problems, bruising.  Neuro: negative for - confusion, seizures.  Psych: negative for - anxiety, depression, irritability or mood swings,suicidal, or homicidal ideation.      Past Medical History   Diagnosis Date   ??? Seizures ([facility])    ??? Hypertension    ??? Stroke [facility])      Past Surgical History   Procedure Laterality Date   ??? Hx hysterectomy           Current Outpatient Meds and Allergies     Current Outpatient Prescriptions   Medication Sig Dispense Refill   ??? gabapentin (NEURONTIN) 300 mg capsule Take one tab po tid  Indications: NEUROPATHIC PAIN 90 Cap 2   ??? lisinopril-hydrochlorothiazide (PRINZIDE, ZESTORETIC) 10-12.5 mg per tablet Take 1 Tab by mouth daily. Indications: HYPERTENSION 30 Tab 2   ??? nicotine (NICODERM CQ) 21 mg/24 hr 1 Patch by TransDERmal route every twenty-four (24) hours for 30 days. Indications: NICOTINE WITHDRAWAL SYMPTOMS, SMOKING CESSATION 30 Patch 1    ??? megestrol (MEGACE) 400 mg/10 mL (40 mg/mL) suspension Take 10 mL by mouth two (2) times a day. 480 mL 3   ??? ribavirin (RIBASPHERE) 200 mg tablet Take 200 mg by mouth two (2) times a day. Take two tablets in the am and two tablets in the pm by mouth     ??? sofosbuvir (SOVALDI) 400 mg tab Take  by mouth.     ??? methocarbamol (ROBAXIN) 500 mg tablet Take 1 Tab by mouth four (4) times daily. 20 Tab 0   ??? albuterol (PROVENTIL HFA, VENTOLIN HFA, PROAIR HFA) 90 mcg/actuation inhaler Take 2 Puffs by inhalation every six (6) hours as needed for Wheezing. 1 Inhaler 6   ??? albuterol sulfate (PROVENTIL;VENTOLIN) 2.5 mg/0.5 mL nebu nebulizer solution 0.5 mL by Nebulization route every four (4) hours as needed for Wheezing. 20 mL 11   ??? simvastatin (ZOCOR) 40 mg tablet Take 40 mg by mouth nightly.     ??? omeprazole (PRILOSEC) 20 mg capsule Take 20 mg by mouth daily.     ??? levETIRAcetam (KEPPRA) 500 mg tablet Take  by mouth two (2) times a day.     ??? potassium chloride (KLOR-CON) 20 mEq packet Take 20 mEq by mouth daily.     ??? aspirin 81 mg chewable tablet 162 mg.  Allergies   Allergen Reactions   ??? Penicillins Nausea and Vomiting          Objective:       BP Readings from Last 3 Encounters:   11/04/13 143/90   09/09/13 125/80   08/20/13 141/96     VS:  BP 143/90 mmHg   Pulse 89   Temp(Src) 98.6 ??F (37 ??C) (Oral)   Resp 18   Ht 5' 5"  (1.651 m)   Wt 111 lb (50.349 kg)   BMI 18.47 kg/m2   SpO2 99%    Body mass index is 18.47 kg/(m^2).  General:   W/D, W/N,  pleasant, in no acute distress.      Head:  NCAT,                                  Eyes:             PERRLA,EOMI, sclera clear, conjunctiva pink.  Neck:              Supple with normal ROM for age, no adenopathy, thyromegaly.  Cardiovasc:   Regular rate and rhythm, no murmurs, no rubs, no gallops.  Pulmonary:    Normal respiratory effort, good air movement, no wheezing, rales, rhonchi.    Extremities:   No clubbing,cyanosis, edema.   Neuro:   A&Ox3, conversant, appropriate.    Results for orders placed or performed in visit on 96/29/52   METABOLIC PANEL, COMPREHENSIVE   Result Value Ref Range    Glucose 78 65 - 99 mg/dL    BUN 16 8 - 27 mg/dL    Creatinine 1.01 (H) 0.57 - 1.00 mg/dL    GFR est non-AA 61 >59 mL/min/1.73    GFR est AA 70 >59 mL/min/1.73    BUN/Creatinine ratio 16 11 - 26    Sodium 139 134 - 144 mmol/L    Potassium 4.6 3.5 - 5.2 mmol/L    Chloride 100 97 - 108 mmol/L    CO2 21 18 - 29 mmol/L    Calcium 9.8 8.7 - 10.3 mg/dL    Protein, total 7.8 6.0 - 8.5 g/dL    Albumin 4.6 3.6 - 4.8 g/dL    GLOBULIN, TOTAL 3.2 1.5 - 4.5 g/dL    A-G Ratio 1.4 1.1 - 2.5    Bilirubin, total 1.0 0.0 - 1.2 mg/dL    Alk. phosphatase 40 39 - 117 IU/L    AST 17 0 - 40 IU/L    ALT 11 0 - 32 IU/L       Assessment/Plan & Orders:     Ladon was seen today for hypertension, aphasia and medication refill.    Diagnoses and associated orders for this visit:    Essential hypertension  Comments: Adjusting BP MEDICATION    Dysarthria due to cerebrovascular accident  - REFERRAL TO HOME HEALTH    Tobacco dependency  Comments: Again discussed smoking cessation    Hepatitis C virus carrier state  Comments: Pt to f/u with GI.    Chronic LBP  - MRI LUMB SPINE W WO CONT; Future    Narcotic dependency, continuous (HCC)  Comments: Recommend seeking opiate treatment program    Other Orders  - gabapentin (NEURONTIN) 300 mg capsule; Take one tab po tid  Indications: NEUROPATHIC PAIN  - lisinopril-hydrochlorothiazide (PRINZIDE, ZESTORETIC) 10-12.5 mg per tablet; Take 1 Tab by mouth daily. Indications: HYPERTENSION  - nicotine (NICODERM CQ) 21 mg/24  hr; 1 Patch by TransDERmal route every twenty-four (24) hours for 30 days. Indications: NICOTINE WITHDRAWAL SYMPTOMS, SMOKING CESSATION          Follow-up Disposition:  Return in about 6 weeks (around 12/16/2013) for RE-EVALUATION.     Reviewed with patient the treatment plan, goals of treatment plan, and  limitations of treatment plan.    AVOID FURTHER ILLICIT DRUG USE    I spent 40 minutes with the patient in face-to-face consultation, of which greater than 50% was spent in counseling and coordination of care as described above.       Patient verbalized understanding and is in agreement with treatment plan as outlined above.  All questions answered.        Charlett Nose MD  [facility]  Ph - [phone]  Fax [phone]

## 2013-11-05 NOTE — Telephone Encounter (Signed)
MRI pt prefer [facility].

## 2013-11-07 NOTE — Telephone Encounter (Signed)
Received a fax stating that pt requested to be seen On Monday 11/10/2013, at that she will be out of the 48 hour window for admission. Called [facility] and she stated that she will see pt on 11/10/13 and that the message that was sent over only needs to be noted. Understanding was verbalized.

## 2013-11-10 NOTE — Telephone Encounter (Signed)
Pt's husband called stating pt is currently out of town due to a death in the family & will return on 11/11/13. She was scheduled for home health speech therapy today & he requested it be canceled. [facility] @ [phone] & spoke with Abby & canceled today's appt.

## 2013-11-13 NOTE — Telephone Encounter (Signed)
There is not a medication that any insurance covers that I am aware of.  If she checks with her insurance and they have a suggestion, I will review it.

## 2013-11-13 NOTE — Telephone Encounter (Signed)
Pt's husband stated insurance will not cover nicotine patches & requesting a RX that insurance will cover.

## 2013-11-13 NOTE — Telephone Encounter (Signed)
Please read message below

## 2013-11-14 NOTE — Telephone Encounter (Signed)
Spoke with pt spouse, he will contact insurance and see what medication is covered. He will get back with office concerning specific type of medication. He verbalized understanding.

## 2013-12-02 NOTE — Telephone Encounter (Signed)
Pt is scheduled for her L spine  MRI on 12/22/13 at 9:00 am @ [facility].

## 2013-12-02 NOTE — Telephone Encounter (Signed)
Message noted

## 2013-12-02 NOTE — Telephone Encounter (Signed)
Mammo is scheduled for 12/26/13 at [facility].

## 2013-12-10 NOTE — Telephone Encounter (Signed)
Pt husband stated patient has not had a bowel movement for a while and stated she was in pain from not having one. Advised husband to purchase MOM OTC. Husband verbalized understanding.

## 2013-12-16 ENCOUNTER — Ambulatory Visit
Admit: 2013-12-16 | Discharge: 2013-12-16 | Payer: MEDICARE | Attending: Internal Medicine | Primary: Student in an Organized Health Care Education/Training Program

## 2013-12-16 ENCOUNTER — Encounter

## 2013-12-16 DIAGNOSIS — Z0000 Encounter for general adult medical examination without abnormal findings: Secondary | ICD-10-CM

## 2013-12-16 NOTE — Progress Notes (Signed)
Wanda Doyle is a 58 y.o. female and presents for annual Medicare Wellness Visit.    Problem List: Reviewed with patient and discussed risk factors.    Patient Active Problem List   Diagnosis Code   ??? Tobacco use disorder Z72.0   ??? Essential hypertension I10   ??? Hepatitis C B19.20   ??? Cerebral artery occlusion with cerebral infarction (HCC) I63.9   ??? Unspecified asthma(493.90)    ??? Seizure disorder (HCC) G40.909   ??? HTN (hypertension) I10   ??? GERD (gastroesophageal reflux disease) K21.9   ??? Dysarthria due to cerebrovascular accident I69.922   ??? Chronic LBP M54.5, G89.29   ??? Failure to thrive in adult R62.7       Current medical providers:  Patient Care Team:  Daryll DrownEric L Latyra Jaye, MD as PCP - General (Internal Medicine)  None  Nurudeen Darin EngelsS Lawani, DO (Internal Medicine)  Samuel BoucheJoseph B Hollis, MD as Physician (Gastroenterology)  [facility] Krystal ClarkQuidgley Nevares, MD (Physical Medicine and Rehab)    PSH: Reviewed with patient  Past Surgical History   Procedure Laterality Date   ??? Hx hysterectomy          SH: Reviewed with patient  History   Substance Use Topics   ??? Smoking status: Light Tobacco Smoker     Types: Cigarettes   ??? Smokeless tobacco: Never Used   ??? Alcohol Use: No      Comment: drinks a beer every once in a while       FH: Reviewed with patient  Family History   Problem Relation Age of Onset   ??? Family history unknown: Yes       Immunizations reviewed,   -- Vaccinations:    Influenza vaccine given 8/15   Pneumococcal vaccine given 8/15    Subjective:  Health Maintenance History colonoscopy: TBA, Eye exam:11/15  Medications/Allergies: Reviewed with patient  Current Outpatient Prescriptions on File Prior to Visit   Medication Sig Dispense Refill   ??? gabapentin (NEURONTIN) 300 mg capsule Take one tab po tid  Indications: NEUROPATHIC PAIN 90 Cap 2   ??? lisinopril-hydrochlorothiazide (PRINZIDE, ZESTORETIC) 10-12.5 mg per tablet Take 1 Tab by mouth daily. Indications: HYPERTENSION 30 Tab 2    ??? megestrol (MEGACE) 400 mg/10 mL (40 mg/mL) suspension Take 10 mL by mouth two (2) times a day. 480 mL 3   ??? ribavirin (RIBASPHERE) 200 mg tablet Take 200 mg by mouth two (2) times a day. Take two tablets in the am and two tablets in the pm by mouth     ??? sofosbuvir (SOVALDI) 400 mg tab Take  by mouth.     ??? methocarbamol (ROBAXIN) 500 mg tablet Take 1 Tab by mouth four (4) times daily. 20 Tab 0   ??? albuterol (PROVENTIL HFA, VENTOLIN HFA, PROAIR HFA) 90 mcg/actuation inhaler Take 2 Puffs by inhalation every six (6) hours as needed for Wheezing. 1 Inhaler 6   ??? simvastatin (ZOCOR) 40 mg tablet Take 40 mg by mouth nightly.     ??? omeprazole (PRILOSEC) 20 mg capsule Take 20 mg by mouth daily.     ??? levETIRAcetam (KEPPRA) 500 mg tablet Take  by mouth two (2) times a day.     ??? potassium chloride (KLOR-CON) 20 mEq packet Take 20 mEq by mouth daily.     ??? aspirin 81 mg chewable tablet 162 mg.     ??? albuterol sulfate (PROVENTIL;VENTOLIN) 2.5 mg/0.5 mL nebu nebulizer solution 0.5 mL by Nebulization route every four (4) hours  as needed for Wheezing. 20 mL 11     No current facility-administered medications on file prior to visit.      Allergies   Allergen Reactions   ??? Penicillins Nausea and Vomiting       Objective:  BP 168/106 mmHg   Pulse 76   Temp(Src) 97.2 ??F (36.2 ??C) (Oral)   Resp 16   Ht 5\' 5"  (1.651 m)   Wt 116 lb 6.4 oz (52.799 kg)   BMI 19.37 kg/m2   SpO2 98% Body mass index is 19.37 kg/(m^2).  No exam data present    Assessment of cognitive impairment: Alert and oriented x 3    Depression Screen:   PHQ 2 / 9, over the last two weeks 03/18/2013   Little interest or pleasure in doing things Not at all   Feeling down, depressed or hopeless Not at all   Total Score PHQ 2 0       Fall Risk Assessment:    Fall Risk Assessment, last 12 mths 12/16/2013   Able to walk? Yes   Fall in past 12 months? No       Functional Ability:   Does the patient exhibit a steady gait?  NO    How long did it take the patient to get up and walk from a sitting position?6 SECONDS   Is the patient self reliant?  (ie can do own laundry, meals, household chores) YES     Does the patient handle his/her own medications?  YES     Does the patient handle his/her own money?   YES     Is the patient???s home safe (ie good lighting, handrails on stairs and bath, etc.)?  NO RAILS. NO STAIRS.     Did you notice or did patient express any hearing difficulties?  NO     Did you notice of did patient express any vision difficulties?  NO     Were distance and reading eye charts used?  NO       Advance Care Planning:   Patient was offered the opportunity to discuss advance care planning:YES   Does patient have an Advance Directive: NO   If no, did you provide information on [facility]?  YES       Plan:      Orders Placed This Encounter   ??? REFERRAL FOR COLONOSCOPY       Health Maintenance   Topic Date Due   ??? Hepatitis C Screening  [birth year]   ??? Tdap Age > 18  02/22/1972   ??? Td Q 10 Yrs Age > 18  02/22/1972   ??? BREAST CANCER SCRN MAMMOGRAM  02/22/2003   ??? FOBT Q 1 YEAR AGE 76-75  02/22/2003   ??? ZOSTER VACCINE AGE 21>  02/21/2013   ??? INFLUENZA AGE 73 TO ADULT  08/10/2014   ??? PAP AKA CERVICAL CYTOLOGY  04/09/2016     Reviewed with patient the treatment plan, goals of treatment plan, and limitations of treatment plan.      Patient verbalized understanding and is in agreement with treatment plan as outlined above.  All questions answered.

## 2013-12-16 NOTE — Progress Notes (Signed)
Advance Care Planning:   Patient was offered the opportunity to discuss advance care planning YES   Does patient have an Advance Directive:  NO   If no, did you provide information on [facility]?  YES     1. Have you been to the ER, urgent care clinic since your last visit?  Hospitalized since your last visit?No, pt is unsure    2. Have you seen or consulted any other health care providers outside of the [facility] since your last visit?  Include any pap smears or colon screening. No

## 2013-12-16 NOTE — Patient Instructions (Signed)
Advance Directives: After Your Visit  Your Care Instructions  An advance directive is a legal way to state your wishes at the end of your life. It tells your family and your doctor what to do if you can no longer say what you want.  There are two main types of advance directives. You can change them any time that your wishes change.  ?? A living will tells your family and your doctor your wishes about life support and other treatment.  ?? A medical power of attorney lets you name a person to make treatment decisions for you when you can't speak for yourself. This person is called a health care agent.  If you do not have an advance directive, decisions about your medical care may be made by a doctor or a judge who doesn't know you.  It may help to think of an advance directive as a gift to the people who care for you. If you have one, they won't have to make tough decisions by themselves.  Follow-up care is a key part of your treatment and safety. Be sure to make and go to all appointments, and call your doctor if you are having problems. It's also a good idea to know your test results and keep a list of the medicines you take.  How can you care for yourself at home?  ?? Discuss your wishes with your loved ones and your doctor. This way, there are no surprises.  ?? Many states have a unique form. Or you might use a universal form that has been approved by many states. This kind of form can sometimes be completed and stored online. Your electronic copy will then be available wherever you have a connection to the Internet. In most cases, doctors will respect your wishes even if you have a form from a different state.  ?? You don't need a lawyer to do an advance directive. But you may want to get legal advice.  ?? Think about these questions when you prepare an advance directive:  ?? Who do you want to make decisions about your medical care if you are not able to? Many people choose a family member, close friend, or doctor.   ?? Do you know enough about life support methods that might be used? If not, talk to your doctor so you understand.  ?? What are you most afraid of that might happen? You might be afraid of having pain, losing your independence, or being kept alive by machines.  ?? Where would you prefer to die? Choices include your home, a hospital, or a nursing home.  ?? Would you like to have information about hospice care to support you and your family?  ?? Do you want to donate organs when you die?  ?? Do you want certain religious practices performed before you die? If so, put your wishes in the advance directive.  ?? Read your advance directive every year, and make changes as needed.  When should you call for help?  Be sure to contact your doctor if you have any questions.   Where can you learn more?   Go to http://www.healthwise.net/[facility]  Enter R264 in the search box to learn more about "Advance Directives: After Your Visit."   ?? 2006-2015 Healthwise, Incorporated. Care instructions adapted under license by [place] (which disclaims liability or warranty for this information). This care instruction is for use with your licensed healthcare professional. If you have questions about a medical condition or this   instruction, always ask your healthcare professional. [place] any warranty or liability for your use of this information.  Content Version: 10.5.422740; Current as of: February 28, 2013              Learning About Medical Power of Oneta Rack  What is a medical power of attorney?  A medical power of attorney is one type of the legal forms called advance directives. It lets you decide who you want to make treatment decisions for you if you cannot speak or decide for yourself. The person you choose is called your health care agent.  Another type of advance directive is a living will. It lets you write down what kinds of treatment or life support you want or do not want.   What should you think about when choosing a health care agent?  Choose your health care agent carefully. This person may or may not be a family member.  Talk to the person before you make your final decision. Make sure he or she is comfortable with this responsibility.  It's a good idea to choose someone who:  ?? Is at least 60 years old.  ?? Knows you well and understands what makes life meaningful for you.  ?? Understands your religious and moral values.  ?? Will do what you want, not what he or she wants.  ?? Will be able to make difficult choices at a stressful time.  ?? Will be able to refuse or stop treatment, if that is what you would want, even if you could die.  ?? Will be firm and confident with health professionals if needed.  ?? Will ask questions to get necessary information.  ?? Lives near you or agrees to travel to you if needed.  Your family may help you make medical decisions while you can still be part of that process. But it is important to choose one person to be your health care agent in case you are not able to make decisions for yourself.  If you don't fill out the legal form and name a health care agent, the decisions your family can make may be limited.  Who will make decisions for you if you do not have a health care agent?  If you don't have a health care agent or a living will, your family members may disagree about your medical care. And then some medical professionals who may not know you as well might have to make decisions for you. In some cases, a judge makes the decisions.  When you name a health care agent, it is very clear who has the power to make health decisions for you.  How do you name a health care agent?  You name your health care agent on a legal form. It is usually called a medical power of attorney. Ask your hospital, state bar association, or office on aging where to find these forms.  You must sign the form to make it legal. Some states require you to get  the form notarized. This means that a person called a notary public watches you sign the form and then he or she signs the form. Some states also require that two or more witnesses sign the form.  Be sure to tell your family members and doctors who your health care agent is.  Keep your forms in a safe place. But make sure that your loved ones know where the forms are. This could be in your desk where you keep other  important papers.   Where can you learn more?   Go to [employer].cy  Enter P737 in the search box to learn more about "[place]."   ?? 2006-2015 Healthwise, Incorporated. Care instructions adapted under license by [employer] (which disclaims liability or warranty for this information). This care instruction is for use with your licensed healthcare professional. If you have questions about a medical condition or this instruction, always ask your healthcare professional. [place] any warranty or liability for your use of this information.  Content Version: 10.5.422740; Current as of: February 28, 2013              Learning About Living Eugenie Birks  What is a living will?  A living will is a legal form you use to write down the kind of care you want at the end of your life. It is used by the health professionals who will treat you if you aren't able to decide for yourself.  If you put your wishes in writing, your loved ones and others will know what kind of care you want. They won't need to guess. This can ease your mind and be helpful to others.  A living will is not the same as an estate or property will. An estate will explains what you want to happen with your money and property after you die.  Is a living will a legal document?  A living will is a legal document. Each state has its own laws about living wills. If you move to another state, make sure that your living  will is legal in the state where you now live. Or you might use a universal form that has been approved by many states. This kind of form can sometimes be completed and stored online. Your electronic copy will then be available wherever you have a connection to the Internet. In most cases, doctors will respect your wishes even if you have a form from a different state.  ?? You don't need an attorney to complete a living will. But legal advice can be helpful if your state's laws are unclear, your health history is complicated, or your family can't agree on what should be in your living will.  ?? You can change your living will at any time. Some people find that their wishes about end-of-life care change as their health changes.  ?? In addition to making a living will, think about completing a medical power of attorney form. This form lets you name the person you want to make end-of-life treatment decisions for you (your "health care agent") if you're not able to. Many hospitals and nursing homes will give you the forms you need to complete a living will and a medical power of attorney.  ?? Your living will is used only if you can't make or communicate decisions for yourself anymore. If you become able to make decisions again, you can accept or refuse any treatment, no matter what you wrote in your living will.  ?? Your state may offer an online registry. This is a place where you can store your living will online so the doctors and nurses who need to treat you can find it right away.  What should you think about when creating a living will?  Talk about your end-of-life wishes with your family members and your doctor. Let them know what you want. That way the people making decisions for you won't be surprised by your choices.  Think about  these questions as you make your living will:  ?? Do you know enough about life support methods that might be used? If not, talk to your doctor so you know what might be done if you can't  breathe on your own, your heart stops, or you're unable to swallow.  ?? What things would you still want to be able to do after you receive life-support methods? Would you want to be able to walk? To speak? To eat on your own? To live without the help of machines?  ?? If you have a choice, where do you want to be cared for? In your home? At a hospital or nursing home?  ?? Do you want certain religious practices performed if you become very ill?  ?? If you have a choice at the end of your life, where would you prefer to die? At home? In a hospital or nursing home? Somewhere else?  ?? Would you prefer to be buried or cremated?  ?? Do you want your organs to be donated after you die?  What should you do with your living will?  ?? Make sure that your family members and your health care agent have copies of your living will.  ?? Give your doctor a copy of your living will to keep in your medical record. If you have more than one doctor, make sure that each one has a copy.  ?? You may want to put a copy of your living will where it can be easily found.   Where can you learn more?   Go to [email or web address].huhttp://www.healthwise.net/[facility]  Enter K356 in the search box to learn more about "Learning About Living [place]."   ?? 2006-2015 Healthwise, Incorporated. Care instructions adapted under license by [employer] (which disclaims liability or warranty for this information). This care instruction is for use with your licensed healthcare professional. If you have questions about a medical condition or this instruction, always ask your healthcare professional. Healthwise, Incorporated disclaims any warranty or liability for your use of this information.  Content Version: 10.5.422740; Current as of: February 28, 2013

## 2013-12-16 NOTE — Telephone Encounter (Signed)
Pt is able to self referral to dentist.

## 2013-12-16 NOTE — Telephone Encounter (Signed)
Pt is requesting a referral to a dentist.

## 2013-12-23 ENCOUNTER — Encounter

## 2013-12-23 MED ORDER — HYDROCODONE-ACETAMINOPHEN 5 MG-325 MG TAB
5-325 mg | ORAL_TABLET | Freq: Three times a day (TID) | ORAL | Status: DC | PRN
Start: 2013-12-23 — End: 2014-01-19

## 2013-12-23 NOTE — ED Provider Notes (Addendum)
[facility]  EMERGENCY [facility] TREATMENT REPORT  NAME:  Wanda Doyle, Wanda Doyle  SEX:   F  ADMIT: 12/22/2013  DOB:   October 04, 1955  MR#    [id number]  ROOM:    TIME DICTATED: 03 59 PM  ACCT#  [id number]    cc: Jerolyn ShinEric Jones MD    FAMILY [name]  Eric Jones, MD     CHIEF COMPLAINT:  Right knee pain, ankle pain and calf pain.    HISTORY OF PRESENT ILLNESS:  The patient is a 58 year old female.  She presents to the ER today stating  that she hit her knee on a bedframe 2 to 3 days ago.  Subsequently, she has  developed pain in her knee, in her right calf and in her right ankle.  The  patient denies any other injury or trauma.  She denies any swelling or warmth  to the area.  She denies any body aches and fevers, denies any history of  having a DVT in the past.      REVIEW OF SYSTEMS:   CONSTITUTIONAL:  No fever, chills or weight loss.   EYES:  No visual symptoms.   ENT:  No sore throat, runny nose or other URI symptoms.   RESPIRATORY:  No shortness of breath, cough or wheezing.   CARDIOVASCULAR:  No chest pain, pressure or palpitations.   GASTROINTESTINAL:  No vomiting, diarrhea or abdominal pain.   MUSCULOSKELETAL:  Right knee, calf and ankle pain.  INTEGUMENTARY:  No rashes.   NEUROLOGICAL:  No headaches.    PAST MEDICAL HISTORY:  History of having a CVA, seizures.    PAST SURGICAL HISTORY:   Unavailable at this time.    SOCIAL HISTORY:  The patient denies smoking, alcohol or drug use.    ALLERGIES:  PENICILLIN.    CURRENT MEDICATIONS:  Unknown.    PHYSICAL EXAMINATION:  VITAL SIGNS:  Blood pressure is 127/74, pulse 74, respirations 17, temperature  is 97.6, pain is 8 out of 10, O2 sat is 100% on room air.  GENERAL APPEARANCE:  Patient appears well developed and well nourished.  Appearance and behavior are age and situation appropriate.   RESPIRATORY:  Clear and equal breath sounds.  No respiratory distress,  tachypnea or accessory muscle use.   CARDIOVASCULAR:   Heart regular, without murmurs, gallops, rubs or  thrills.    MUSCULOSKELETAL:  The patient has a small effusion noted right below the  patella on her right knee.  There is no ecchymosis.  There is no redness.  There is no warmth.  Able to range of motion the knee without any difficulty.  She does not have any tenderness in the popliteal space.  She does have  tenderness when palpating down the right calf into the right ankle.  Ankle is  not swollen.  She is able to dorsi- and plantarflex without any difficulty.  Dorsal pedal pulses are equal and intact.  SKIN:  Warm and dry without rashes.   NEUROLOGIC:  Alert, oriented.  Sensation intact, motor strength equal and  symmetric.  There is no facial asymmetry or dysarthria.     INITIAL ASSESSMENT AND TREATMENT PLAN:  The patient is going to be evaluated for the pain that it is in her knee, calf  and ankle today.  We will obtain x-rays and PVL.  I placed these orders.  Discussed the case with Dr. Carmela HurtFickenscher and the patient eloped prior to any of  the studies being done before being seen by M.D.  ___________________  Christiana PellantBen A Lauris Keepers MD  Dictated By: Eliot Fordhristine Ward, NP    My signature above authenticates this document and my orders, the final  diagnosis (es), discharge prescription (s), and instructions in the [facility]  Pul[facility]ck record.  Nursing notes have been reviewed by the physician/mid-level provider.    If you have any questions please contact [phone].    SC  D:12/22/2013 15:59:37  T: 12/23/2013 08:39:16  [id number]  Electronically Authenticated by:  Carlis StableBen A. Carmela HurtFickenscher, M.D. On 01/02/2014 11:16 PM EST

## 2013-12-23 NOTE — Telephone Encounter (Signed)
Wanda Doyle is not on Release of information form, spoke with pt and pt requested information given to Wanda Doyle. Advised pt/Wanda Doyle to try OTC tylenol or motrin. Pt stated she has tried that at this moment narcotics will not be prescribed from this office. Pt/Wanda Doyle stated that they have been calling around trying to find pain management and advise them that due to pt insurance she has limited options. Pt verbalized understanding.

## 2013-12-23 NOTE — Telephone Encounter (Signed)
[facility] suggested a referral for speech therapy.

## 2013-12-23 NOTE — Telephone Encounter (Signed)
Pts daughter-in-law called,stating that pt had a fall, and is requesting pain medication (she also was concerned about blood clots?)  Please call her at her home number (room 116) to discuss symptoms and evaluate if she needs and appt.

## 2013-12-24 NOTE — Telephone Encounter (Signed)
Attempt to reach Home Health concerning referral.

## 2013-12-26 ENCOUNTER — Encounter

## 2013-12-26 NOTE — Telephone Encounter (Signed)
[facility]

## 2013-12-26 NOTE — Telephone Encounter (Signed)
pts Husband [name] called asking if pt had a Mammogram scheduled.  I do see an order from August in the system, and [facility]- [name] was unable to understand pt to get information, but maybe they need an order sent over??  Please call pt to clarify what her needs are

## 2013-12-26 NOTE — Telephone Encounter (Signed)
Correction, mamm[facility]

## 2013-12-26 NOTE — Telephone Encounter (Signed)
Message noted.

## 2013-12-29 ENCOUNTER — Ambulatory Visit
Admit: 2013-12-29 | Discharge: 2013-12-29 | Payer: MEDICARE | Attending: Nurse Practitioner | Primary: Student in an Organized Health Care Education/Training Program

## 2013-12-29 NOTE — Progress Notes (Signed)
Error.  The patient had a negative colon screening 2013 by Dr. Lovenia ShuckKreger.  Will need another colon screening in 2023.  Wanda Buttearol Francies Inch, NP

## 2013-12-29 NOTE — Patient Instructions (Signed)
If you have any questions or concerns about today's appointment, the verbal and/or written instructions you were given for follow up care, please call our office at [phone].    [facility]  [address], [address]  [place] [address]    [phone] office  [phone] fax

## 2014-01-03 NOTE — ED Provider Notes (Addendum)
[facility]  EMERGENCY [facility] TREATMENT REPORT  NAME:  Wanda RowerJOYNER, [name]  SEX:   F  ADMIT: 12/22/2013  DOB:   1955/02/13  MR#    [id number]  ROOM:    TIME DICTATED: 11 18 PM  ACCT#  [id number]        ADDENDUM BY Dixie DialsBEN Sema Stangler, MD    FINAL DIAGNOSES:  1.  Right knee pain.  2.  Right ankle pain.  3.  Right calf pain.  4.  Left prior to MD evaluation.      ___________________  Christiana PellantBen A Alycia Cooperwood MD  Dictated By: Marland Kitchen.     My signature above authenticates this document and my orders, the final  diagnosis (es), discharge prescription (s), and instructions in the [facility]  Pulsecheck record.  Nursing notes have been reviewed by the physician/mid-level provider.    If you have any questions please contact [phone].    RA  D:01/02/2014 23:18:48  T: 01/03/2014 14:78:2904:26:38  [id number]  Electronically Authenticated by:  Carlis StableBen A. Carmela HurtFickenscher, M.D. On 01/10/2014 08:22 AM EST

## 2014-01-05 NOTE — Telephone Encounter (Signed)
Spoke with [facility] regarding request for medical records to acquire a copy of Ms. Wanda Doyle colonoscopy and pathology report returned with no records attached. Per [facility] medical records department Ms. Wanda Doyle did not have a colonoscopy with their office as their records reflect she was seen by Dr. Sandria SenterJoseph Hollis.

## 2014-01-05 NOTE — Telephone Encounter (Signed)
Spoke with Dr. Sandria SenterJoseph Hollis office to forward medical record request to obtain a copy of Ms. Wanda Doyle colonoscopy report. Dr. Hart RochesterHollis office states Ms. Wanda Doyle did not return to have the colonoscopy done.

## 2014-01-06 MED ORDER — MEGESTROL 40 MG/ML ORAL SUSP
400 mg/10 mL (40 mg/mL) | Freq: Two times a day (BID) | ORAL | Status: DC
Start: 2014-01-06 — End: 2014-04-20

## 2014-01-06 NOTE — Telephone Encounter (Signed)
Requested Prescriptions     Pending Prescriptions Disp Refills   ??? megestrol (MEGACE) 400 mg/10 mL (40 mg/mL) suspension 480 mL 3     Sig: Take 10 mL by mouth two (2) times a day.

## 2014-01-07 ENCOUNTER — Encounter

## 2014-01-14 NOTE — Telephone Encounter (Signed)
Wanda HockingMichael Doyle pt's husband states [facility] will no longer continue services with pt. [facility] suggested pt would benefit from outpatient therapy for speech, physical,OT. [facility] will fax over form.

## 2014-01-15 NOTE — Telephone Encounter (Signed)
Message noted.

## 2014-01-19 MED ORDER — GABAPENTIN 300 MG CAP
300 mg | ORAL_CAPSULE | ORAL | Status: DC
Start: 2014-01-19 — End: 2014-03-27

## 2014-01-19 MED ORDER — HYDROCODONE-ACETAMINOPHEN 5 MG-325 MG TAB
5-325 mg | ORAL_TABLET | Freq: Three times a day (TID) | ORAL | Status: DC | PRN
Start: 2014-01-19 — End: 2014-02-05

## 2014-01-19 NOTE — Telephone Encounter (Signed)
Requested Prescriptions     Pending Prescriptions Disp Refills   ??? gabapentin (NEURONTIN) 300 mg capsule 90 Cap 2     Sig: Take one tab po tid  Indications: NEUROPATHIC PAIN   ??? HYDROcodone-acetaminophen (NORCO) 5-325 mg per tablet 45 Tab 0     Sig: Take 1 Tab by mouth every eight (8) hours as needed for Pain. Max Daily Amount: 3 Tabs.     Advised to allow up to 72 hrs to process request. [facility] ok.

## 2014-01-21 NOTE — Telephone Encounter (Signed)
Pt will stop nicotine patch for now.

## 2014-01-21 NOTE — Telephone Encounter (Signed)
When pt was in picking up her prescription, she wanted me to let you know that she is having a skin reaction to the nicotine patch.  Is this something we prescribed?  If so, is there an alternative?  Please contact pt for more information.Marland Kitchen..Marland Kitchen

## 2014-01-23 ENCOUNTER — Ambulatory Visit: Attending: Physical Medicine & Rehabilitation

## 2014-01-23 ENCOUNTER — Ambulatory Visit
Admit: 2014-01-23 | Discharge: 2014-01-23 | Payer: MEDICARE | Attending: Physical Medicine & Rehabilitation | Primary: Student in an Organized Health Care Education/Training Program

## 2014-01-23 DIAGNOSIS — M48061 Spinal stenosis, lumbar region without neurogenic claudication: Secondary | ICD-10-CM

## 2014-01-23 MED ORDER — GABAPENTIN 300 MG CAP
300 mg | ORAL_CAPSULE | Freq: Three times a day (TID) | ORAL | Status: DC
Start: 2014-01-23 — End: 2014-01-23

## 2014-01-23 MED ORDER — GABAPENTIN 600 MG TAB
600 mg | ORAL_TABLET | Freq: Three times a day (TID) | ORAL | Status: DC
Start: 2014-01-23 — End: 2014-03-27

## 2014-01-23 NOTE — Progress Notes (Signed)
[facility]  [address]  [place], [place] [address]  Phone: [phone]  Fax: [phone]        INITIAL CONSULTATION      HISTORY OF PRESENT ILLNESS:  Wanda Doyle is a 59 y.o. female whom is referred from Dr. Jerolyn Shin secondary to low back with intermittent right lower extremity pain, distribution unclear x 1 1/2 years. She is accompanied by her friend who assisted with her HPI. She rates pain 8/10. Patient was followed by Dr. Sheron Nightingale, pain management. Patient has had in home physical therapy a few times. She states therapy provided some relief. Notes from Dr. Yetta Barre were reviewed.  Her friend reports she tolerates Neurontin 300 mg. She is on Keppra as preescribed by another physician. Her friend states she is followed by neurologist, Dr. Vickey Huger. Patient has hx of seizure disorder and stroke and HEP C. Of note, patient has dysarthric speech and communication was limited. Patient denies change in bowel or bladder habits. Patient denies fever, weight loss, or skin changes. ER notes were reviewed. Lumbar spine MRI from 12/22/13 was reviewed. Per report, there was multilevel degenerative disc disease most notable at L3-L4 and L4-L5 with moderate to severe central stenosis. Annular fissures noted at L4-L5. Incidental note of nonspecific left adrenal gland thickening and atrophic left kidney. PMP reviewed.       Past Medical History   Diagnosis Date   ??? Seizures (HCC)    ??? Hypertension    ??? Stroke [facility])      Past Surgical History   Procedure Laterality Date   ??? Hx hysterectomy           Past Surgical History   Procedure Laterality Date   ??? Hx hysterectomy     ??? Smoking status: Light Tobacco Smoker     Types: Cigarettes   ??? Smokeless tobacco: Never Used   ??? Alcohol Use: No      Comment: drinks a beer every once in a while     Work status: The patient is not employed.  Marital status: The patient is single.     Current Outpatient Prescriptions    Medication Sig Dispense Refill   ??? gabapentin (NEURONTIN) 600 mg tablet Take 1 Tab by mouth three (3) times daily. 90 Tab 1   ??? gabapentin (NEURONTIN) 300 mg capsule Take one tab po tid  Indications: NEUROPATHIC PAIN 90 Cap 2   ??? megestrol (MEGACE) 400 mg/10 mL (40 mg/mL) suspension Take 10 mL by mouth two (2) times a day. 480 mL 3   ??? lisinopril-hydrochlorothiazide (PRINZIDE, ZESTORETIC) 10-12.5 mg per tablet Take 1 Tab by mouth daily. Indications: HYPERTENSION 30 Tab 2   ??? ribavirin (RIBASPHERE) 200 mg tablet Take 200 mg by mouth two (2) times a day. Take two tablets in the am and two tablets in the pm by mouth     ??? sofosbuvir (SOVALDI) 400 mg tab Take  by mouth.     ??? methocarbamol (ROBAXIN) 500 mg tablet Take 1 Tab by mouth four (4) times daily. 20 Tab 0   ??? albuterol (PROVENTIL HFA, VENTOLIN HFA, PROAIR HFA) 90 mcg/actuation inhaler Take 2 Puffs by inhalation every six (6) hours as needed for Wheezing. 1 Inhaler 6   ??? albuterol sulfate (PROVENTIL;VENTOLIN) 2.5 mg/0.5 mL nebu nebulizer solution 0.5 mL by Nebulization route every four (4) hours as needed for Wheezing. 20 mL 11   ??? simvastatin (ZOCOR) 40 mg tablet Take 40 mg by mouth nightly.     ???  omeprazole (PRILOSEC) 20 mg capsule Take 20 mg by mouth daily.     ??? potassium chloride (KLOR-CON) 20 mEq packet Take 20 mEq by mouth daily.     ??? aspirin 81 mg chewable tablet 162 mg.     ??? HYDROcodone-acetaminophen (NORCO) 5-325 mg per tablet Take 1 Tab by mouth every eight (8) hours as needed for Pain. Max Daily Amount: 3 Tabs. 45 Tab 0   ??? levETIRAcetam (KEPPRA) 500 mg tablet Take  by mouth two (2) times a day.         Allergies   Allergen Reactions   ??? Penicillins Nausea and Vomiting            Family History   Problem Relation Age of Onset   ??? Family history unknown: Yes         REVIEW OF SYSTEMS  Constitutional symptoms: Negative  Eyes: Negative  Ears, Nose, Throat, and Mouth: Negative  Cardiovascular: Negative  Respiratory: Negative  Genitourinary: Negative   Integumentary (Skin and/or breast): Negative  Musculoskeletal: Positive for low back pain extending into RLE.  Extremities: Negative for edema.  Endocrine/Rheumatologic: Negative  Hematologic/Lymphatic: Negative  Allergic/Immunologic: Negative  Psychiatric: Negative       PHYSICAL EXAMINATION  BP 124/74 mmHg   Pulse 76   Temp(Src) 98.6 ??F (37 ??C) (Oral)   Resp 20   Ht 5\' 5"  (1.651 m)   Wt 120 lb (54.432 kg)   BMI 19.97 kg/m2    CONSTITUTIONAL: NAD, A&O x 3  HEART: Regular rate and rhythm  ABDOMEN: Positive bowel sounds, soft, nontender, and nondistended  LUNGS: Clear to auscultation bilaterally.  RANGE OF MOTION: The patient has full passive range of motion in all four extremities.  SENSATION: Decreased sensation to light touch S1 on the right  MOTOR:   Straight Leg Raise: Negative, bilateral  Hoffman: Negative, bilateral  Deep tendon reflexes are 2+ at the brachioradialis, biceps, and triceps.  Deep tendon reflexes are 2+ at the left knee, 2 at the right knee and 0 at the right ankle and 1 at the left ankle.    Right side weakness from history of strokes.   Shoulder AB/Flex Elbow Flex Wrist Ext Elbow Ext Wrist Flex Hand Intrin Tone   Right +4/5 +4/5 +4/5 +4/5 +4/5 +4/5 +4/5   Left +4/5 +4/5 +4/5 +4/5 +4/5 +4/5 +4/5              Hip Flex Knee Ext Knee Flex Ankle DF GTE Ankle PF Tone   Right 4/5 4/5 +4/5 +4/5 +4/5 +4/5 +4/5   Left +4/5 +4/5 +4/5 +4/5 +4/5 +4/5 +4/5       ASSESSMENT   [name] was seen today for back pain and leg pain.    Diagnoses and all orders for this visit:    Lumbar spinal stenosis  Orders:  -     EMG ONE EXTREMITY LOWER RT; Future    Lumbar neuritis  Orders:  -     EMG ONE EXTREMITY LOWER RT; Future    CVA, old, hemiparesis (HCC)    Other orders  -     Discontinue: gabapentin (NEURONTIN) 300 mg capsule; Take 1 Cap by mouth three (3) times daily.  -     gabapentin (NEURONTIN) 600 mg tablet; Take 1 Tab by mouth three (3) times daily.           IMPRESSIONS/RECOMMENDATIONS:   I have discussed with the patient I will not be taking over her chronic pain management. I  will increase her Neurontin to 600 mg TID. I will set her up for an EMG with Dr. Vickey Huger. I will see the patient back following the EMG.        Written by Marinell Blight, ScribeKick, as dictated by Laney Pastor, MD  I examined the patient, reviewed and agree with the note.

## 2014-01-26 NOTE — Telephone Encounter (Signed)
Message noted.

## 2014-01-26 NOTE — Telephone Encounter (Signed)
[name] called to let us know that patient went to ER ([facility])  last night for leg pain, was diagnosed with a knee sprain and given a brace.  Pt has an appt here on Feb 2nd.

## 2014-01-26 NOTE — Progress Notes (Signed)
Order for EMG lumbar R/O radic RLE, Neurontin 300mg  TID 390 RF 1 entered 01-23-14 per verbal request from Dr. Scotty CourtPezzella.

## 2014-01-29 NOTE — ED Provider Notes (Addendum)
[facility]  EMERGENCY DEPARTMENT TREATMENT REPORT  NAME:  Wanda Doyle  SEX:   F  ADMIT: 01/26/2014  DOB:   11/26/1955  MR#    [id number]  ROOM:    TIME DICTATED: 04 01 AM  ACCT#  [id number]        CHIEF COMPLAINT:  Bilateral leg pain status post fall.    HISTORY OF PRESENT ILLNESS:  A 59 year old female with history of CVA, apparently fell down last week and  hit her right knee and twisted her left ankle and is complaining of pain, came  in to get evaluated.  Did not hit her head.  Did not have a syncopal episode.  Has no other injuries or any pain.    REVIEW OF SYSTEMS:  Right knee, left ankle pain.  CONSTITUTIONAL:  No fever, chills, or weight loss.   EYES:   No visual symptoms.   ENT:  No sore throat, runny nose, or other URI symptoms.   RESPIRATORY:  No cough, shortness of breath, or wheezing.   CARDIOVASCULAR:  No chest pain, chest pressure, or palpitations.   GASTROINTESTINAL:  No vomiting, diarrhea, or abdominal pain.   INTEGUMENTARY:  No rashes.   NEUROLOGICAL:  No headaches, sensory or motor symptoms.     PAST MEDICAL HISTORY:  Hypertension, hyperlipidemia, reflux, CVA.    FAMILY HISTORY:  Hypertension, diabetes.    SOCIAL HISTORY:  Does not drink or smoke.  Lives alone.    ALLERGIES:  SEE IBEX.    MEDICATIONS:  Multiple and reviewed in Ibex.    PHYSICAL EXAMINATION:  VITAL SIGNS:  124/76, 72, 16, 98.6, 0 to 10 pain scale 8 out  of  10,  saturation 99%.  RESPIRATORY:  Clear and equal breath sounds.  No respiratory distress,  tachypnea, or accessory muscle use.     CARDIOVASCULAR:  Heart regular rate and rhythm without any rubs, murmurs,  gallops or thrills.  GI:  Abdomen soft, nontender, without complaint of pain to palpation.  No  hepatomegaly or splenomegaly.   SKIN:  Warm and dry without rashes.   NEUROLOGIC:  Alert, oriented.  Sensation intact, motor strength equal and  symmetric.   MUSCULOSKELETAL:  Examination of patient's hips, able to flex and extend and   internally and externally rotate both hips, no pain.  She is able to flex and  extend the knee, but does have some tenderness at the right knee but no  effusion, no elasticity on posterior drawer test, valgus and varus forces are  intact to both knees.  Does have some tenderness to the lateral malleolus of  the left ankle.  Dorsalis pedis and posterior tibials +2.  Capillary refill  less than 3 seconds.    COURSE IN EMERGENCY DEPARTMENT:  We will go ahead and get imaging to make sure there are no fractures.   This is a new problem for this patient. Old records were reviewed.  No  additional relevant information was obtained. The patient's history was  discussed with family members.  No additional relevant information was  obtained. Nursing notes were reviewed.   X-ray of the right knee, left ankle negative, read by Dr. Sheela Stack.  Placed in  an air splint and Ace wrap.    FINAL DIAGNOSES:  1.  Acute right knee sprain.  2.  Acute left ankle sprain status post fall.    DISPOSITION:   The patient was personally evaluated by myself and Dr. Wenda Overland who agrees  with the above assessment and  plan.   We will send her home on Norco.      ___________________  Liberty HandyJennifer H Himmel-Dajiah Kooi DO  Dictated By: Hilaria OtaNelson Santiago, PA-C    My signature above authenticates this document and my orders, the final  diagnosis (es), discharge prescription (s), and instructions in the [facility]  Pul[facility]ck record.  Nursing notes have been reviewed by the physician/mid-level provider.    If you have any questions please contact [phone].    FS  D:01/29/2014 04:01:56  T: 01/29/2014 12:48:06  [id number]  Electronically Authenticated by:  Carolin GuernseyJENNIFER HIMMEL-Sharnette Kitamura, DO On 02/07/2014 02:01 AM EST

## 2014-01-29 NOTE — Progress Notes (Signed)
Wanda Doyle w/[facility] called regarding EMG scheduling request. Patient's [place] is expired. She has her birth certificate and will obtain a new ID. She will call the office to schedule once the new ID is obtained.

## 2014-02-05 NOTE — Telephone Encounter (Signed)
Requested Prescriptions     Pending Prescriptions Disp Refills   ??? HYDROcodone-acetaminophen (NORCO) 5-325 mg per tablet 45 Tab 0     Sig: Take 1 Tab by mouth every eight (8) hours as needed for Pain. Max Daily Amount: 3 Tabs.     Pt advided to allow  Up to 72 hrs to process request. Stated ok

## 2014-02-07 NOTE — ED Provider Notes (Addendum)
[facility]ATMENT REPORT  NAME:  Sofie RowerJOYNER, Miray  SEX:   F  ADMIT: 01/26/2014  DOB:   September 20, 1955  MR#    [id number]  ROOM:    TIME DICTATED: 11 21 PM  ACCT#  [id number]        CONTINUATION BY Pepe Mineau HIMMEL-Leaman Abe, DO    I interviewed and examined the patient.  I discussed with the mid-level  provider and agree with their evaluation and plan as documented here.    The patient is a 59 year old lady with a history of stroke who fell down, hit  her right knee, twisted her right ankle this evening.  X-rays were reviewed  and were negative.  The patient was placed in an air splint and Ace wrap for  comfort.  The patient discharged home for followup care.      ___________________  Liberty HandyJennifer H Himmel-Mikalah Skyles DO  Dictated By: Liberty HandyJennifer H. Himmel-Sharbel Sahagun, DO    My signature above authenticates this document and my orders, the final  diagnosis (es), discharge prescription (s), and instructions in the [facility]  Pulsecheck record.  Nursing notes have been reviewed by the physician/mid-level provider.    If you have any questions please contact [phone].    RA  D:02/06/2014 23:21:01  T: 02/07/2014 03:31:15  [id number]  Electronically Authenticated by:  Carolin GuernseyJENNIFER HIMMEL-Keanna Tugwell, DO On 02/09/2014 04:00 AM EST

## 2014-02-09 MED ORDER — HYDROCODONE-ACETAMINOPHEN 5 MG-325 MG TAB
5-325 mg | ORAL_TABLET | Freq: Three times a day (TID) | ORAL | Status: DC | PRN
Start: 2014-02-09 — End: 2014-03-09

## 2014-02-10 ENCOUNTER — Ambulatory Visit
Admit: 2014-02-10 | Discharge: 2014-02-10 | Payer: MEDICARE | Attending: Internal Medicine | Primary: Student in an Organized Health Care Education/Training Program

## 2014-02-10 ENCOUNTER — Encounter

## 2014-02-10 DIAGNOSIS — I10 Essential (primary) hypertension: Secondary | ICD-10-CM

## 2014-02-10 NOTE — Patient Instructions (Signed)
Back Pain: Care Instructions  Your Care Instructions     Back pain has many possible causes. It is often related to problems with muscles and ligaments of the back. It may also be related to problems with the nerves, discs, or bones of the back. Moving, lifting, standing, sitting, or sleeping in an awkward way can strain the back. Sometimes you don't notice the injury until later. Arthritis is another common cause of back pain.  Although it may hurt a lot, back pain usually improves on its own within several weeks. Most people recover in 12 weeks or less. Using good home treatment and being careful not to stress your back can help you feel better sooner.  Follow-up care is a key part of your treatment and safety. Be sure to make and go to all appointments, and call your doctor if you are having problems. It???s also a good idea to know your test results and keep a list of the medicines you take.  How can you care for yourself at home?  ?? Sit or lie in positions that are most comfortable and reduce your pain. Try one of these positions when you lie down:  ?? Lie on your back with your knees bent and supported by large pillows.  ?? Lie on the floor with your legs on the seat of a sofa or chair.  ?? Lie on your side with your knees and hips bent and a pillow between your legs.  ?? Lie on your stomach if it does not make pain worse.  ?? Do not sit up in bed, and avoid soft couches and twisted positions. Bed rest can help relieve pain at first, but it delays healing. Avoid bed rest after the first day of back pain.  ?? Change positions every 30 minutes. If you must sit for long periods of time, take breaks from sitting. Get up and walk around, or lie in a comfortable position.  ?? Try using a heating pad on a low or medium setting for 15 to 20 minutes every 2 or 3 hours. Try a warm shower in place of one session with the heating pad.  ?? You can also try an ice pack for 10 to 15 minutes every 2 to 3 hours.  Put a thin cloth between the ice pack and your skin.  ?? Take pain medicines exactly as directed.  ?? If the doctor gave you a prescription medicine for pain, take it as prescribed.  ?? If you are not taking a prescription pain medicine, ask your doctor if you can take an over-the-counter medicine.  ?? Take short walks several times a day. You can start with 5 to 10 minutes, 3 or 4 times a day, and work up to longer walks. Walk on level surfaces and avoid hills and stairs until your back is better.  ?? Return to work and other activities as soon as you can. Continued rest without activity is usually not good for your back.  ?? To prevent future back pain, do exercises to stretch and strengthen your back and stomach. Learn how to use good posture, safe lifting techniques, and proper body mechanics.  When should you call for help?  Call your doctor now or seek immediate medical care if:  ?? You have new or worsening numbness in your legs.  ?? You have new or worsening weakness in your legs. (This could make it hard to stand up.)  ?? You lose control of your bladder or bowels.  Watch closely for changes in your health, and be sure to contact your doctor if:  ?? Your pain gets worse.  ?? You are not getting better after 2 weeks.   Where can you learn more?   Go to [employer]  Enter I594 in the search box to learn more about "Back Pain: Care Instructions."   ?? 2006-2015 Healthwise, Incorporated. Care instructions adapted under license by [employer] (which disclaims liability or warranty for this information). This care instruction is for use with your licensed healthcare professional. If you have questions about a medical condition or this instruction, always ask your healthcare professional. [place] any warranty or liability for your use of this information.  Content Version: 10.7.482551; Current as of: May 30, 2013              Back Stretches: Exercises   Your Care Instructions  Here are some examples of exercises for stretching your back. Start each exercise slowly. Ease off the exercise if you start to have pain.  Your doctor or physical therapist will tell you when you can start these exercises and which ones will work best for you.  How to do the exercises  Overhead stretch    ?? Stand comfortably with your feet shoulder-width apart.  ?? Looking straight ahead, raise both arms over your head and reach toward the ceiling. Do not allow your head to tilt back.  ?? Hold for 15 to 30 seconds, then lower your arms to your sides.  ?? Repeat 2 to 4 times.  Side stretch    ?? Stand comfortably with your feet shoulder-width apart.  ?? Raise one arm over your head, and then lean to the other side.  ?? Slide your hand down your leg as you let the weight of your arm gently stretch your side muscles. Hold for 15 to 30 seconds.  ?? Repeat 2 to 4 times on each side.  Press-up    ?? Lie on your stomach, supporting your body with your forearms.  ?? Press your elbows down into the floor to raise your upper back. As you do this, relax your stomach muscles and allow your back to arch without using your back muscles. As your press up, do not let your hips or pelvis come off the floor.  ?? Hold for 15 to 30 seconds, then relax.  ?? Repeat 2 to 4 times.  Relax and rest    ?? Lie on your back with a rolled towel under your neck and a pillow under your knees. Extend your arms comfortably to your sides.  ?? Relax and breathe normally.  ?? Remain in this position for about 10 minutes.  ?? If you can, do this 2 or 3 times each day.  Follow-up care is a key part of your treatment and safety. Be sure to make and go to all appointments, and call your doctor if you are having problems. It's also a good idea to know your test results and keep a list of the medicines you take.   Where can you learn more?   Go to [employer].com.cy   Enter Y090 in the search box to learn more about "Back Stretches: Exercises."   ?? 2006-2015 Healthwise, Incorporated. Care instructions adapted under license by [employer] (which disclaims liability or warranty for this information). This care instruction is for use with your licensed healthcare professional. If you have questions about a medical condition or this instruction, always ask your healthcare professional. [employer]  disclaims any warranty or liability for your use of this information.  Content Version: 10.7.482551; Current as of: May 30, 2013              Learning About Hepatitis C  What is hepatitis C?     Hepatitis C is a liver infection. It is caused by the hepatitis C virus. The virus is spread through infected blood and body fluids.  Hepatitis C is often spread when a person shares infected needles used to inject illegal drugs. It also can be spread if a person uses a needle that has infected blood on it. This could happen when you get a tattoo or piercing. Or it can happen when you get a shot in some developing countries where they use needles more than once to give shots.  In rare cases, a mother with hepatitis C can spread the virus to her baby at birth. Or a health care worker may accidentally be exposed to blood that is infected with hepatitis C.  There is a very small risk of getting the virus through sexual contact. The risk is higher if your sex partner also has HIV or another sexually transmitted infection, or if you have many sex partners.  You can't get hepatitis C from casual contact. This is contact such as hugging, kissing, sneezing, coughing, and sharing food or drinks.  What happens when you have hepatitis C?  Some people who get hepatitis C have it for a short time and then get better. This is called acute hepatitis C.  But most people get long-term, or chronic, hepatitis C. This can lead to liver damage as well as cirrhosis, liver cancer, and liver failure.   Experts recommend that certain groups of people get tested for the virus. These include people who have signs of liver disease or have ever shared needles while using illegal drugs. Ask your doctor if testing is right for you.  You can also buy a home test called a Home Access Hepatitis C Check kit at most drugstores. If the test shows that you have been exposed to the virus in the past, be sure to talk to your doctor to find out if you have the virus now.  What are the symptoms?  Most people who get hepatitis C do not have symptoms at first. Symptoms may include:  ?? Tiredness.  ?? Headache.  ?? Sore muscles.  ?? Nausea.  ?? Pain in the upper right belly.  ?? Yellowing of your skin and eyes (jaundice).  ?? Dark urine.  How can you prevent hepatitis C?  There is no vaccine to prevent the disease. Anyone who has hepatitis C can spread the virus to someone else. You can take steps to make infection less likely.  ?? Do not share needles to inject drugs.  ?? Follow safety guidelines if you work in a health care setting. Wear protective gloves and clothing. Dispose of needles and other sharp objects properly.  ?? Make sure all instruments and supplies are sterilized if you get a tattoo, have your body pierced, or have acupuncture.  To avoid spreading hepatitis C if you have it:  ?? Do not share needles or other equipment, such as cotton, spoons, and water, if you use needles to inject drugs.  ?? Keep cuts, scrapes, and blisters covered. This will prevent others from coming in contact with your blood and other body fluids. Throw out any blood-soaked items such as used bandages.  ?? Do not donate blood or  sperm.  ?? Wash your hands and anything that has come in contact with your blood. Use soap and water.  ?? Do not share your toothbrush, razor, nail clippers, or anything else that might have your blood on it.  ?? Use latex condoms during sex if you have HIV, multiple sex partners, or a sexually transmitted infection.   How is hepatitis C treated?  ?? If you have acute hepatitis C, your doctor will probably prescribe medicine.  ?? If you have chronic hepatitis C, your treatment depends on whether you have liver damage, other health problems you may have, and how much virus is in your body and what type it is.  ?? You will need to see your doctor regularly to have blood tests to check your liver.  Follow-up care is a key part of your treatment and safety. Be sure to make and go to all appointments, and call your doctor if you are having problems. It's also a good idea to know your test results and keep a list of the medicines you take.   Where can you learn more?   Go to [employer].com.cy  Enter C666 in the search box to learn more about "Learning About Hepatitis C."   ?? 2006-2015 Healthwise, Incorporated. Care instructions adapted under license by [employer] (which disclaims liability or warranty for this information). This care instruction is for use with your licensed healthcare professional. If you have questions about a medical condition or this instruction, always ask your healthcare professional. [place] any warranty or liability for your use of this information.  Content Version: 10.7.482551; Current as of: May 30, 2013              Developing a Pain Management Plan: Care Instructions  Your Care Instructions  Some diseases and injuries can cause pain that lasts a long time. You don't need to live with uncontrolled pain. A pain management plan helps you find ways to control pain with side effects you can live with.  There are things you can do to help with pain. Only you know how much pain you feel. Constant pain can make you depressed. It can cause stress and make it hard for you to eat and sleep. But there are ways to control the pain. They can help you stay active, improve your mood, and heal faster.  Your plan can include many types of pain control. You may take  prescription or over-the-counter drugs. You can also try physical treatments and behavioral methods. Some medical treatments also help with pain. For example, radiation can be used to reduce pain from bone cancer.  You and your doctor will work to make your plan. Be sure to read it often. Change it if your pain is not under control.  Follow-up care is a key part of your treatment and safety. Be sure to make and go to all appointments, and call your doctor if you are having problems. It's also a good idea to know your test results and keep a list of the medicines you take.  How can you care for yourself at home?  Physical treatments  ?? Be safe with medicines. Take pain medicines exactly as directed.  ?? If the doctor gave you a prescription medicine for pain, take it exactly as prescribed.  ?? If you are not taking a prescription pain medicine, ask your doctor if you can take an over-the-counter medicine.  ?? If your pain medicine causes side effects such as constipation or  nausea, you may need to take other medicines for those problems. Talk to your doctor about any side effects you have.  ?? Hot or cold compresses applied directly to the skin can help sore muscles. Put ice or a cold pack on the painful area for 10 to 20 minutes at a time. Put a thin cloth between the ice and your skin. You may find that it helps to switch between cold and heat. Try a heating pad set on low or a warm cloth on the area for 10 to 15 minutes at a time.  ?? Hydrotherapy uses flowing water to relax muscles. You may want to sit in a hot tub or a steam bath. Or use a shower or a sitz bath to help your pain.  ?? Massage therapy is rubbing the soft tissues of the body. It eases tension and pain. It also improves blood flow and helps you relax.  ?? Transcutaneous electrical nerve stimulation (TENS) uses a gentle electric current applied to the skin for pain relief. You can use TENS at home after you learn how.   ?? Acupuncture is a form of traditional [place] medicine. It uses very thin needles inserted into certain points of the body. It is done by a person with special training (acupuncturist).  ?? If you get physical therapy, make sure to do any home exercises or stretching your therapist has prescribed.  ?? Stay as active as you can. Try to get some physical activity every day.  Behavioral treatments   ?? Biofeedback teaches you to control a body function that you normally don't think about. These are things like skin temperature, heart rate, and blood pressure. At first, you will use a machine to give you feedback on the function you want to control. Then a therapist will teach you what to do next. Over time, you can stop using the machine.  ?? Breathing techniques can help you relax and get rid of tension.  ?? Guided imagery is a series of thoughts and images that can focus your attention away from your pain. When you do it, you use all your senses to help your body respond as though what you are imagining is real.  ?? Hypnosis is a state of focused concentration that makes you less aware of your surroundings. It may cause your brain to release chemicals that relieve pain. You can have a therapist help you through hypnosis. Or you can learn to use it on yourself.  ?? Cognitive behavioral therapy is a type of counseling. It helps you change your thought patterns. Changes in your thoughts can help change your behavior and the way you perceive your body.  Other treatments and ideas  ?? Aromatherapy uses the scent of oils obtained from plants to help you relax or to relieve stress.  ?? Meditation focuses your attention to give a clear awareness of your life. You sit quietly, focus on one image or sound, and breathe deeply.  ?? Yoga uses stretching and exercises (called postures) to reduce stress and improve flexibility and health.  ?? Keep track of your pain in a pain diary. This can help you understand  how the things you do affect your pain.  ?? In rare cases, your doctor may advise you to get a nerve block to help with pain. A nerve block is a shot of medicine to interrupt pain signals to your brain.  ?? Some hospitals have special pain clinics or centers. Your doctor may refer you to a  pain clinic or center. Or you can ask your doctor about them.   Where can you learn more?   Go to [employer].com.cy  Enter I556 in the search box to learn more about "Developing a Pain Management Plan: Care Instructions."   ?? 2006-2015 Healthwise, Incorporated. Care instructions adapted under license by [employer] (which disclaims liability or warranty for this information). This care instruction is for use with your licensed healthcare professional. If you have questions about a medical condition or this instruction, always ask your healthcare professional. [place] any warranty or liability for your use of this information.  Content Version: 10.7.482551; Current as of: February 28, 2013

## 2014-02-10 NOTE — Progress Notes (Signed)
Internal Medicine  Progress Note  Today's Date:  02/10/2014   Patient:  Wanda Doyle  Patient DOB:  [birth year]    Subjective:     Chief Complaint   Patient presents with   ??? Knee Swelling   ??? Ankle swelling   ??? Hospital Follow Up     chronic pain   ??? Medication Reaction     tobacco patches giving topical reaction        HPI: Wanda Doyle is a 59 y.o. African American female who presents for scheduled f/u. Pt presenting with significant other. She continues to have LBP, also now with c/o pain in her knees, primarily the left one. She has been seen by Ortho and is awaiting an MRI. She is also requesting another referral for speech therapy. She presently denies any illicit drug or alcohol use.    ROS:   General: negative for - chills, fatigue, fever, weight change  ENT: negative for - headaches,eye pain, or drainage, ear pain or drainage, hearing change, nasal congestion, oral lesions, sneezing or sore throat  Resp: negative for - cough, shortness of breath, or wheezing  CV: negative for - chest pain, edema or palpitations  GI: negative for - abdominal pain, change in bowel habits, constipation, diarrhea or nausea/vomiting  GU: negative for - dysuria,frequency, urgency, hematuria, incontinence  Heme/ Lymph: negative for - bleeding problems, bruising.  Neuro: negative for - confusion, seizures or weakness.  Psych: negative for - anxiety, depression, irritability or mood swings,suicidal, or homicidal ideation      Past Medical History   Diagnosis Date   ??? Seizures ([place])    ??? Hypertension    ??? Stroke [facility])      Past Surgical History   Procedure Laterality Date   ??? Hx hysterectomy           Current Outpatient Meds and Allergies     Current Outpatient Prescriptions   Medication Sig Dispense Refill   ??? HYDROcodone-acetaminophen (NORCO) 5-325 mg per tablet Take 1 Tab by mouth every eight (8) hours as needed for Pain. Max Daily Amount: 3 Tabs. 45 Tab 0    ??? gabapentin (NEURONTIN) 600 mg tablet Take 1 Tab by mouth three (3) times daily. 90 Tab 1   ??? gabapentin (NEURONTIN) 300 mg capsule Take one tab po tid  Indications: NEUROPATHIC PAIN 90 Cap 2   ??? megestrol (MEGACE) 400 mg/10 mL (40 mg/mL) suspension Take 10 mL by mouth two (2) times a day. 480 mL 3   ??? lisinopril-hydrochlorothiazide (PRINZIDE, ZESTORETIC) 10-12.5 mg per tablet Take 1 Tab by mouth daily. Indications: HYPERTENSION 30 Tab 2   ??? ribavirin (RIBASPHERE) 200 mg tablet Take 200 mg by mouth two (2) times a day. Take two tablets in the am and two tablets in the pm by mouth     ??? sofosbuvir (SOVALDI) 400 mg tab Take  by mouth.     ??? methocarbamol (ROBAXIN) 500 mg tablet Take 1 Tab by mouth four (4) times daily. 20 Tab 0   ??? albuterol (PROVENTIL HFA, VENTOLIN HFA, PROAIR HFA) 90 mcg/actuation inhaler Take 2 Puffs by inhalation every six (6) hours as needed for Wheezing. 1 Inhaler 6   ??? albuterol sulfate (PROVENTIL;VENTOLIN) 2.5 mg/0.5 mL nebu nebulizer solution 0.5 mL by Nebulization route every four (4) hours as needed for Wheezing. 20 mL 11   ??? simvastatin (ZOCOR) 40 mg tablet Take 40 mg by mouth nightly.     ??? omeprazole (PRILOSEC) 20 mg  capsule Take 20 mg by mouth daily.     ??? levETIRAcetam (KEPPRA) 500 mg tablet Take  by mouth two (2) times a day.     ??? potassium chloride (KLOR-CON) 20 mEq packet Take 20 mEq by mouth daily.     ??? aspirin 81 mg chewable tablet 162 mg.           Allergies   Allergen Reactions   ??? Penicillins Nausea and Vomiting          Objective:       BP Readings from Last 3 Encounters:   02/10/14 125/81   01/23/14 124/74   12/16/13 144/83     VS:  BP 125/81 mmHg   Pulse 103   Temp(Src) 98.5 ??F (36.9 ??C) (Oral)   Resp 18   Ht 5' 5"  (1.651 m)   Wt 118 lb 9.6 oz (53.797 kg)   BMI 19.74 kg/m2   SpO2 98%    Body mass index is 19.74 kg/(m^2).  General:   W/D, W/N,  pleasant, in no acute distress.      Eyes:             PERRLA,EOMI, sclera clear, conjunctiva pink.   Cardiovasc:   Regular rate and rhythm, no murmurs, no rubs, no gallops.  Pulmonary:    Normal respiratory effort, good air movement, no wheezing, rales, rhonchi.  Extremities:   No clubbing,cyanosis, trace pretibial edema, no tenderness with palpation of calves.  MUS/SKE:     FROM with crepitus, and pain when ranging her knees, minimal joint swelling, no discoloration,or increased warmth. Normal bulk and, tone for age.  Neuro:   A&Ox3, conversant, appropriate.     Results for orders placed or performed in visit on 84/16/60   METABOLIC PANEL, COMPREHENSIVE   Result Value Ref Range    Glucose 78 65 - 99 mg/dL    BUN 16 8 - 27 mg/dL    Creatinine 1.01 (H) 0.57 - 1.00 mg/dL    GFR est non-AA 61 >59 mL/min/1.73    GFR est AA 70 >59 mL/min/1.73    BUN/Creatinine ratio 16 11 - 26    Sodium 139 134 - 144 mmol/L    Potassium 4.6 3.5 - 5.2 mmol/L    Chloride 100 97 - 108 mmol/L    CO2 21 18 - 29 mmol/L    Calcium 9.8 8.7 - 10.3 mg/dL    Protein, total 7.8 6.0 - 8.5 g/dL    Albumin 4.6 3.6 - 4.8 g/dL    GLOBULIN, TOTAL 3.2 1.5 - 4.5 g/dL    A-G Ratio 1.4 1.1 - 2.5    Bilirubin, total 1.0 0.0 - 1.2 mg/dL    Alk. phosphatase 40 39 - 117 IU/L    AST 17 0 - 40 IU/L    ALT 11 0 - 32 IU/L       Assessment/Plan & Orders:     Wanda Doyle was seen today for knee swelling, ankle swelling, hospital follow up and medication reaction.    Diagnoses and all orders for this visit:    Essential hypertension  Comments:   Stable    Dysarthria due to cerebrovascular accident  Orders:  -     REFERRAL TO SPEECH THERAPY    Spinal stenosis, lumbar  Comments:   Awaiting MRI, and EMG/NCS    Hepatitis C virus infection, unspecified chronicity    Chronic narcotic dependence ([place])  Comments:   Pt being required to sign a pain management contract.  Orders:  -  12-DRUG SCREEN W/CONF; Future    Chronic LBP  Orders:  -     12-DRUG SCREEN W/CONF; Future    Osteoarthritis of both knees, unspecified osteoarthritis type        Follow-up Disposition:   Return in about 4 months (around 06/11/2014) for RE-EVALUATION.     Reviewed with patient the treatment plan, goals of treatment plan, and limitations of treatment plan.      Patient verbalized understanding and is in agreement with treatment plan as outlined above.  All questions answered.        Charlett Nose MD  [facility]  Ph - [phone]  Fax [phone]

## 2014-02-23 NOTE — Telephone Encounter (Signed)
Lou requesting order for mammo aduse on left breast with possible ultrasound

## 2014-02-24 ENCOUNTER — Encounter

## 2014-02-24 NOTE — Telephone Encounter (Signed)
Please order US of left breast. Include do not schedule, [facility].

## 2014-02-24 NOTE — Telephone Encounter (Signed)
Order faxed to [facility]

## 2014-02-25 NOTE — Telephone Encounter (Signed)
[name] is requesting a call back regarding pt's Mamo results.

## 2014-02-25 NOTE — Telephone Encounter (Signed)
[name] calling again, requesting to speak with nurse, states very important.Marland Kitchen..Marland Kitchen

## 2014-02-25 NOTE — Telephone Encounter (Signed)
Attempted to reach pt concerning call, called both numbers attached to the file and no one picked up.

## 2014-02-26 NOTE — Telephone Encounter (Signed)
Please try to call again.

## 2014-02-27 NOTE — Telephone Encounter (Signed)
Several attempts to call pt concerning this matter. Each attempt failed.

## 2014-03-09 MED ORDER — HYDROCODONE-ACETAMINOPHEN 5 MG-325 MG TAB
5-325 mg | ORAL_TABLET | Freq: Three times a day (TID) | ORAL | Status: DC | PRN
Start: 2014-03-09 — End: 2014-03-27

## 2014-03-09 NOTE — Telephone Encounter (Signed)
Pt called again, she states she needs refill of hydrocodone.   Requested Prescriptions     Pending Prescriptions Disp Refills   ??? HYDROcodone-acetaminophen (NORCO) 5-325 mg per tablet 45 Tab 0     Sig: Take 1 Tab by mouth every eight (8) hours as needed for Pain. Max Daily Amount: 3 Tabs.   Pt advised of 72 hour policy, we will call when script ready to be picked up.

## 2014-03-09 NOTE — Telephone Encounter (Signed)
Informed pt and pt's partner that her medication was ready for pick up here at the office. Pt will pick up medication when she can get transportation and would like to know when her next appt will be. Informed pt and pt's partner that I would ask provider. Pt's partner states that he will call tomorrow to find out of next appt. Pt and pt's partner verbalized understanding of conversation.

## 2014-03-09 NOTE — Telephone Encounter (Signed)
Pt called asking for a refill of a medication that she takes once per month- she does not know the name of the medication or what she takes it for.  Please contact pt to clarify her needs.

## 2014-03-11 NOTE — Telephone Encounter (Signed)
Wanda Doyle after several attempt to make appt with pt she discarded all of Wanda paperwork. On last attempt was able to make appt for pt with Wanda Doyle. Kenn FileMarquesa is requesting an updated copy of speech therapy order and most recent office notes . Fax # [phone]

## 2014-03-11 NOTE — Telephone Encounter (Signed)
Per Lemmie EvensMichael Law pt has scheduled transportation to p/u rx on Friday 03/13/14.

## 2014-03-12 MED ORDER — LISINOPRIL-HYDROCHLOROTHIAZIDE 10 MG-12.5 MG TAB
ORAL_TABLET | Freq: Every day | ORAL | Status: DC
Start: 2014-03-12 — End: 2014-03-24

## 2014-03-12 NOTE — Telephone Encounter (Signed)
Requested Prescriptions     Pending Prescriptions Disp Refills   ??? lisinopril-hydrochlorothiazide (PRINZIDE, ZESTORETIC) 10-12.5 mg per tablet 30 Tab 2     Sig: Take 1 Tab by mouth daily. Indications: HYPERTENSION

## 2014-03-13 NOTE — Telephone Encounter (Signed)
Message noted.

## 2014-03-24 NOTE — Telephone Encounter (Signed)
Requested Prescriptions     Pending Prescriptions Disp Refills   ??? lisinopril-hydrochlorothiazide (PRINZIDE, ZESTORETIC) 10-12.5 mg per tablet 30 Tab 2     Sig: Take 1 Tab by mouth daily. Indications: HYPERTENSION

## 2014-03-25 NOTE — Telephone Encounter (Signed)
Last Visit: 01/23/2014 with MD Scotty CourtPezzella    Next Appointment: noted to f/u after EMG   Previous Refill Encounters: 01/23/2014 per MD Pezzella #[id number] with 1 refill     Requested Prescriptions     Pending Prescriptions Disp Refills   ??? gabapentin (NEURONTIN) 600 mg tablet 270 Tab 0     Sig: Take 1 Tab by mouth three (3) times daily.

## 2014-03-25 NOTE — Telephone Encounter (Signed)
Has this patient had the EMG done?

## 2014-03-26 MED ORDER — LISINOPRIL-HYDROCHLOROTHIAZIDE 10 MG-12.5 MG TAB
ORAL_TABLET | Freq: Every day | ORAL | Status: AC
Start: 2014-03-26 — End: ?

## 2014-03-26 NOTE — Telephone Encounter (Signed)
Needs to have EMG before we will refill meds.

## 2014-03-26 NOTE — Telephone Encounter (Signed)
No I do not see that she has been scheduled. I called the patient but was unable to reach her. It looks like she is going to go to [facility] but has an issue with her ID.

## 2014-03-27 MED ORDER — GABAPENTIN 600 MG TAB
600 mg | ORAL_TABLET | Freq: Three times a day (TID) | ORAL | Status: DC
Start: 2014-03-27 — End: 2014-08-17

## 2014-03-27 MED ORDER — HYDROCODONE-ACETAMINOPHEN 5 MG-325 MG TAB
5-325 mg | ORAL_TABLET | Freq: Three times a day (TID) | ORAL | Status: DC | PRN
Start: 2014-03-27 — End: 2014-04-28

## 2014-03-27 NOTE — Telephone Encounter (Signed)
Requested Prescriptions     Pending Prescriptions Disp Refills   ??? HYDROcodone-acetaminophen (NORCO) 5-325 mg per tablet 60 Tab 0     Sig: Take 1 Tab by mouth every eight (8) hours as needed for Pain. Max Daily Amount: 3 Tabs.   ??? gabapentin (NEURONTIN) 600 mg tablet 90 Tab 1     Sig: Take 1 Tab by mouth three (3) times daily.

## 2014-03-30 ENCOUNTER — Encounter

## 2014-03-30 MED ORDER — SIMVASTATIN 40 MG TAB
40 mg | ORAL_TABLET | Freq: Every evening | ORAL | Status: AC
Start: 2014-03-30 — End: ?

## 2014-03-30 NOTE — Telephone Encounter (Signed)
Requested Prescriptions     Pending Prescriptions Disp Refills   ??? simvastatin (ZOCOR) 40 mg tablet       Sig: Take 1 Tab by mouth nightly.

## 2014-03-30 NOTE — Progress Notes (Signed)
EMG RLE has not been completed. Patient was contacted on 01/23/14 to schedule the EMG. Patient did not have a picture ID stating she needed to obtain another ID because her previous ID expired. Cindy at [facility] contacted the patient several times but she still has not obtained the ID.

## 2014-03-31 NOTE — Telephone Encounter (Signed)
Patient has not had her EMG at this time. She is still waiting on her ID card. Patient's SO states that Mrs. Wanda Doyle should receive her ID this week and at that time her EMG can be scheduled. Since it has been some time since the order was entered it has been cancelled at this time and another order will need to be entered. Please advise.

## 2014-03-31 NOTE — Telephone Encounter (Signed)
Reorder EMG and have patient to call us when it is scheduled.  No meds until emg is scheduled.

## 2014-04-20 MED ORDER — MEGESTROL 40 MG/ML ORAL SUSP
400 mg/10 mL (40 mg/mL) | Freq: Two times a day (BID) | ORAL | Status: DC
Start: 2014-04-20 — End: 2014-05-28

## 2014-04-20 NOTE — Telephone Encounter (Signed)
Requested Prescriptions     Pending Prescriptions Disp Refills   ??? megestrol (MEGACE) 400 mg/10 mL (40 mg/mL) suspension 480 mL 3     Sig: Take 10 mL by mouth two (2) times a day.     Refill requests states:   MEGESTROL ACET 40MG /ML SUSP  480.0 ML [place], 3 refills  Take 10 ML by mouth twice daily  Date written: 01/06/14  [facility] #: [id number]    Pt is out of medication

## 2014-04-21 MED ORDER — ALBUTEROL SULFATE 2.5 MG/0.5 ML NEB SOLUTION
2.5 mg/0.5 mL | RESPIRATORY_TRACT | Status: AC | PRN
Start: 2014-04-21 — End: ?

## 2014-04-21 NOTE — Telephone Encounter (Signed)
Requested Prescriptions     Pending Prescriptions Disp Refills   ??? albuterol sulfate (PROVENTIL;VENTOLIN) 2.5 mg/0.5 mL nebu nebulizer solution 20 mL 11     Sig: 0.5 mL by Nebulization route every four (4) hours as needed for Wheezing.     Casimiro NeedleMichael called in refill request Stated pt also need a new mouth piece and tubing for nebulizer machine. States company called [facility] home stopped sending  replacement parts due to pt's ins no longer cover services.

## 2014-04-21 NOTE — Telephone Encounter (Signed)
Refill will be sent over for approval. If company states that insurance no longer cover mouth piece and tubing, if rx is sent over pt will pay out of pocket expense. Please advise pt that they should call insurance for further clarification.

## 2014-04-21 NOTE — Telephone Encounter (Signed)
Unable to notify pt of approval of medication. Medication sent to pharmacy.

## 2014-04-23 NOTE — Telephone Encounter (Signed)
Called patient and left a VM for patient to return the call. I would like to know if she has gotten her ID card yet. I will enter the order when patient has her card. Patient is unable to be seen for the EMG if she does not have valid ID.

## 2014-04-28 NOTE — Telephone Encounter (Signed)
Requested Prescriptions     Pending Prescriptions Disp Refills   ??? HYDROcodone-acetaminophen (NORCO) 5-325 mg per tablet 60 Tab 0     Sig: Take 1 Tab by mouth every eight (8) hours as needed for Pain. Max Daily Amount: 3 Tabs.

## 2014-04-28 NOTE — Telephone Encounter (Signed)
Pt following up on refill request. Stated called pharmacy and was told there is no medication ready. Advised pt rx has to be picked up at the office and will call when ready for p/u. Stated ok

## 2014-04-29 NOTE — Telephone Encounter (Signed)
[name] called to check status of this refill.

## 2014-04-30 MED ORDER — HYDROCODONE-ACETAMINOPHEN 5 MG-325 MG TAB
5-325 mg | ORAL_TABLET | Freq: Three times a day (TID) | ORAL | Status: DC | PRN
Start: 2014-04-30 — End: 2014-05-19

## 2014-04-30 NOTE — Telephone Encounter (Signed)
Attempted to notify that medication is ready for pick up.

## 2014-05-15 ENCOUNTER — Encounter: Attending: Internal Medicine | Primary: Student in an Organized Health Care Education/Training Program

## 2014-05-19 NOTE — Telephone Encounter (Signed)
Requested Prescriptions     Pending Prescriptions Disp Refills   ??? HYDROcodone-acetaminophen (NORCO) 5-325 mg per tablet 60 Tab 0     Sig: Take 1 Tab by mouth every eight (8) hours as needed for Pain. Max Daily Amount: 3 Tabs.     Wanda NeedleMichael states pt has appt on fri May 13th and will p/u rx at that time.

## 2014-05-21 MED ORDER — HYDROCODONE-ACETAMINOPHEN 5 MG-325 MG TAB
5-325 mg | ORAL_TABLET | Freq: Three times a day (TID) | ORAL | Status: DC | PRN
Start: 2014-05-21 — End: 2014-06-29

## 2014-05-22 ENCOUNTER — Encounter: Attending: Internal Medicine | Primary: Student in an Organized Health Care Education/Training Program

## 2014-05-28 ENCOUNTER — Ambulatory Visit
Admit: 2014-05-28 | Discharge: 2014-05-28 | Payer: MEDICARE | Attending: Internal Medicine | Primary: Student in an Organized Health Care Education/Training Program

## 2014-05-28 ENCOUNTER — Encounter

## 2014-05-28 DIAGNOSIS — I10 Essential (primary) hypertension: Secondary | ICD-10-CM

## 2014-05-28 MED ORDER — NICOTINE 7 MG/24 HR DAILY PATCH
7 mg/24 hr | MEDICATED_PATCH | TRANSDERMAL | Status: AC
Start: 2014-05-28 — End: 2014-06-27

## 2014-05-28 MED ORDER — MEGESTROL 40 MG/ML ORAL SUSP
400 mg/10 mL (40 mg/mL) | Freq: Three times a day (TID) | ORAL | Status: DC
Start: 2014-05-28 — End: 2014-08-17

## 2014-05-28 MED ORDER — NEBULIZER ACCESSORIES KIT
PACK | Status: DC
Start: 2014-05-28 — End: 2014-06-17

## 2014-05-28 NOTE — Progress Notes (Signed)
Internal Medicine  Progress Note  Today's Date:  05/28/2014   Patient:  Wanda Doyle  Patient DOB:  [birth year]    Subjective:     Chief Complaint   Patient presents with   ??? Stroke     follow up        HPI: Tanica Gaige is a 59 y.o. African American female who presents for scheduled f/u, significant other present as well.  She is requesting replacement of accessories for her nebulizer. She relates continues issues with LBP and some intermittent, ankle swelling, but admits to dietary indiscretions with using large amounts of salt. She is having some issues with appetite being poor, and fatigue since her Gabapentin dose was recently increased. She does relate being told by her Gastroenterologist that her Hep C is now in remission as a result of anti-viral therapy.    ROS:   Negative except as noted above.      Past Medical History   Diagnosis Date   ??? Seizures ([place])    ??? Hypertension    ??? Stroke [facility])      Past Surgical History   Procedure Laterality Date   ??? Hx hysterectomy           Current Outpatient Meds and Allergies     Current Outpatient Prescriptions   Medication Sig Dispense Refill   ??? megestrol (MEGACE) 400 mg/10 mL (40 mg/mL) suspension Take 10 mL by mouth three (3) times daily (with meals). 480 mL 5   ??? nicotine (NICODERM CQ) 7 mg/24 hr 1 Patch by TransDERmal route every twenty-four (24) hours for 30 days. Indications: SMOKING CESSATION 30 Patch 2   ??? Nebulizer Accessories kit Use as directed 1 Kit 0   ??? albuterol sulfate (PROVENTIL;VENTOLIN) 2.5 mg/0.5 mL nebu nebulizer solution 0.5 mL by Nebulization route every four (4) hours as needed for Wheezing. 20 mL 11   ??? gabapentin (NEURONTIN) 600 mg tablet Take 1 Tab by mouth three (3) times daily. 90 Tab 1   ??? lisinopril-hydrochlorothiazide (PRINZIDE, ZESTORETIC) 10-12.5 mg per tablet Take 1 Tab by mouth daily. Indications: HYPERTENSION 30 Tab 2   ??? albuterol (PROVENTIL HFA, VENTOLIN HFA, PROAIR HFA) 90 mcg/actuation  inhaler Take 2 Puffs by inhalation every six (6) hours as needed for Wheezing. 1 Inhaler 6   ??? omeprazole (PRILOSEC) 20 mg capsule Take 20 mg by mouth daily.     ??? levETIRAcetam (KEPPRA) 500 mg tablet Take  by mouth two (2) times a day.     ??? potassium chloride (KLOR-CON) 20 mEq packet Take 20 mEq by mouth daily.     ??? aspirin 81 mg chewable tablet 162 mg.     ??? HYDROcodone-acetaminophen (NORCO) 5-325 mg per tablet Take 1 Tab by mouth every eight (8) hours as needed for Pain. Max Daily Amount: 3 Tabs. 60 Tab 0   ??? simvastatin (ZOCOR) 40 mg tablet Take 1 Tab by mouth nightly. 30 Tab 5   ??? methocarbamol (ROBAXIN) 500 mg tablet Take 1 Tab by mouth four (4) times daily. 20 Tab 0         Allergies   Allergen Reactions   ??? Penicillins Nausea and Vomiting          Objective:       BP Readings from Last 3 Encounters:   05/28/14 100/67   02/10/14 125/81   01/23/14 124/74     VS:  BP 100/67 mmHg   Pulse 99   Temp(Src) 98.3 ??F (36.8 ??C) (  Oral)   Resp 18   Ht _0  (1.651 m)   Wt 114 lb (51.71 kg)   BMI 18.97 kg/m2   SpO2 96%    Body mass index is 18.97 kg/(m^2).  General:   Frail appearing,  pleasant, in no acute distress.      Head:  NCAT,                                  Eyes:             PERRLA,EOMI, sclera clear.  Neck:              Supple with normal ROM for age, no adenopathy, thyromegaly, JVD, or bruits.  Cardiovasc:   Regular rate and rhythm, no murmurs, no rubs, no gallops.  Pulmonary:    Normal respiratory effort, good air movement, no wheezing, rales, rhonchi.    Abdomen:      Soft, nontender, nondistended, no HSM, nl bowel sounds.  Extremities:   No clubbing,cyanosis, edema, no tenderness with palpation of calves.  Neuro:   A&Ox3, remains markedly dysarthric, appropriate.    Results for orders placed or performed in visit on 63/01/60   METABOLIC PANEL, COMPREHENSIVE   Result Value Ref Range    Glucose 78 65 - 99 mg/dL    BUN 16 8 - 27 mg/dL    Creatinine 1.01 (H) 0.57 - 1.00 mg/dL     GFR est non-AA 61 >59 mL/min/1.73    GFR est AA 70 >59 mL/min/1.73    BUN/Creatinine ratio 16 11 - 26    Sodium 139 134 - 144 mmol/L    Potassium 4.6 3.5 - 5.2 mmol/L    Chloride 100 97 - 108 mmol/L    CO2 21 18 - 29 mmol/L    Calcium 9.8 8.7 - 10.3 mg/dL    Protein, total 7.8 6.0 - 8.5 g/dL    Albumin 4.6 3.6 - 4.8 g/dL    GLOBULIN, TOTAL 3.2 1.5 - 4.5 g/dL    A-G Ratio 1.4 1.1 - 2.5    Bilirubin, total 1.0 0.0 - 1.2 mg/dL    Alk. phosphatase 40 39 - 117 IU/L    AST 17 0 - 40 IU/L    ALT 11 0 - 32 IU/L        Assessment/Plan & Orders:     Diamantina was seen today for stroke.    Diagnoses and all orders for this visit:    Essential hypertension with goal blood pressure less than 130/80  Comments:   CONTINUE PRESENT MANAGEMENT  Orders:  -     METABOLIC PANEL, COMPREHENSIVE; Future  -     LIPID PANEL; Future    Dysarthria due to cerebrovascular accident [facility])  Comments:   Continue OT    Tobacco use disorder  Orders:  -     nicotine (NICODERM CQ) 7 mg/24 hr; 1 Patch by TransDERmal route every twenty-four (24) hours for 30 days. Indications: SMOKING CESSATION  -     LIPID PANEL; Future  Facilities manager; Use as directed    CVA, old, hemiparesis ([place])  Comments:   STABLE    Lumbar neuritis  Comments:   PER ORTHO    Primary osteoarthritis involving multiple joints  Comments:   PER ORTHO    Spinal stenosis, lumbar    Weight loss, unintentional  Orders:  -  megestrol (MEGACE) 400 mg/10 mL (40 mg/mL) suspension; Take 10 mL by mouth three (3) times daily (with meals).  -     METABOLIC PANEL, COMPREHENSIVE; Future  -     TSH 3RD GENERATION; Future  -     CBC WITH AUTOMATED DIFF; Future    Healthcare maintenance  Orders:  -     METABOLIC PANEL, COMPREHENSIVE; Future  -     LIPID PANEL; Future  -     URINALYSIS W/MICROSCOPIC; Future  -     CBC WITH AUTOMATED DIFF; Future    Narcotic dependency, continuous ([place])  Orders:  -     7-DRUG SCREEN + ETHANOL, UR; Future        Follow-up Disposition:   Return in about 3 months (around 08/28/2014) for RE-EVALUATION.   Reviewed with patient the treatment plan, goals of treatment plan, and limitations of treatment plan.  Patient verbalized understanding and is in agreement with treatment plan as outlined above.  All questions answered.        Charlett Nose MD  [facility]  Ph - [phone]  Fax [phone]

## 2014-05-29 LAB — METABOLIC PANEL, COMPREHENSIVE
A-G Ratio: 1.3 (ref 1.1–2.5)
ALT (SGPT): 6 IU/L (ref 0–32)
AST (SGOT): 22 IU/L (ref 0–40)
Albumin: 4.4 g/dL (ref 3.6–4.8)
Alk. phosphatase: 45 IU/L (ref 39–117)
BUN/Creatinine ratio: 13 (ref 11–26)
BUN: 14 mg/dL (ref 8–27)
Bilirubin, total: 0.8 mg/dL (ref 0.0–1.2)
CO2: 17 mmol/L — ABNORMAL LOW (ref 18–29)
Calcium: 9.1 mg/dL (ref 8.7–10.3)
Chloride: 101 mmol/L (ref 97–108)
Creatinine: 1.07 mg/dL — ABNORMAL HIGH (ref 0.57–1.00)
GFR est AA: 65 mL/min/{1.73_m2} (ref >59)
GFR est non-AA: 56 mL/min/{1.73_m2} — ABNORMAL LOW (ref >59)
GLOBULIN, TOTAL: 3.4 g/dL (ref 1.5–4.5)
Glucose: 69 mg/dL (ref 65–99)
Potassium: 4.9 mmol/L (ref 3.5–5.2)
Protein, total: 7.8 g/dL (ref 6.0–8.5)
Sodium: 141 mmol/L (ref 134–144)

## 2014-05-29 LAB — CBC WITH AUTOMATED DIFF
ABS. BASOPHILS: 0 10*3/uL (ref 0.0–0.2)
ABS. EOSINOPHILS: 0.1 10*3/uL (ref 0.0–0.4)
ABS. IMM. GRANS.: 0 10*3/uL (ref 0.0–0.1)
ABS. MONOCYTES: 0.6 10*3/uL (ref 0.1–0.9)
ABS. NEUTROPHILS: 3.9 10*3/uL (ref 1.4–7.0)
Abs Lymphocytes: 2.2 10*3/uL (ref 0.7–3.1)
BASOPHILS: 0 %
EOSINOPHILS: 2 %
HCT: 34.9 % (ref 34.0–46.6)
HGB: 11.4 g/dL (ref 11.1–15.9)
IMMATURE GRANULOCYTES: 0 %
Lymphocytes: 32 %
MCH: 28.1 pg (ref 26.6–33.0)
MCHC: 32.7 g/dL (ref 31.5–35.7)
MCV: 86 fL (ref 79–97)
MONOCYTES: 8 %
NEUTROPHILS: 58 %
PLATELET: 269 10*3/uL (ref 150–379)
RBC: 4.06 x10E6/uL (ref 3.77–5.28)
RDW: 16.6 % — ABNORMAL HIGH (ref 12.3–15.4)
WBC: 6.8 10*3/uL (ref 3.4–10.8)

## 2014-05-29 LAB — LIPID PANEL
Cholesterol, total: 195 mg/dL (ref 100–199)
HDL Cholesterol: 83 mg/dL (ref >39)
LDL, calculated: 102 mg/dL — ABNORMAL HIGH (ref 0–99)
Triglyceride: 49 mg/dL (ref 0–149)
VLDL, calculated: 10 mg/dL (ref 5–40)

## 2014-05-29 LAB — 7-DRUG SCREEN + ETHANOL, UR

## 2014-05-29 LAB — URINALYSIS W/MICROSCOPIC

## 2014-05-29 LAB — TSH 3RD GENERATION: TSH: 2.69 u[IU]/mL (ref 0.450–4.500)

## 2014-06-01 NOTE — Progress Notes (Signed)
Quick Note:        Please inform Pt labs okay. Continue present management, f/u as scheduled, or PRN.    ______

## 2014-06-07 LAB — 7-DRUG SCREEN, WHOLE BLD
Amphetamines: NEGATIVE
Barbiturates: NEGATIVE
Cocaine and metabolites: NEGATIVE
Opiates: NEGATIVE
Oxycodone: NEGATIVE
Phencyclidine: NEGATIVE
THC (Marijuana) metabolite: NEGATIVE

## 2014-06-07 LAB — SPECIMEN STATUS REPORT

## 2014-06-09 ENCOUNTER — Encounter

## 2014-06-09 NOTE — Telephone Encounter (Signed)
-----   Message from Daryll DrownEric L Jones, MD sent at 06/09/2014  3:22 PM EDT -----  Regarding: lab results  Pt needs to have random serum drug screen done with less than 24 hour notice due to recent lab results.

## 2014-06-09 NOTE — Telephone Encounter (Signed)
Advised pt that she should get someone to call the office concerning this matter within 24 hours. Pt stated she will get someone to call the office.

## 2014-06-16 NOTE — Telephone Encounter (Signed)
Spoke with Wanda Doyle, pt is unable to come for RUDS, advised that next medication will be refused. Will call back with RUDS and pt only has 24 hours to complete.

## 2014-06-17 ENCOUNTER — Emergency Department
Admit: 2014-06-17 | Payer: PRIVATE HEALTH INSURANCE | Primary: Student in an Organized Health Care Education/Training Program

## 2014-06-17 ENCOUNTER — Inpatient Hospital Stay
Admit: 2014-06-17 | Discharge: 2014-06-17 | Disposition: A | Discharge status: Home / Self Care | Payer: PRIVATE HEALTH INSURANCE | Attending: Emergency Medicine

## 2014-06-17 DIAGNOSIS — R1011 Right upper quadrant pain: Secondary | ICD-10-CM

## 2014-06-17 LAB — METABOLIC PANEL, COMPREHENSIVE
ALT (SGPT): 9 U/L — ABNORMAL LOW (ref 12–78)
AST (SGOT): 7 U/L — ABNORMAL LOW (ref 15–37)
Albumin: 3 gm/dl — ABNORMAL LOW (ref 3.4–5.0)
Alk. phosphatase: 48 U/L (ref 45–117)
BUN: 12 mg/dl (ref 7–25)
Bilirubin, total: 0.5 mg/dl (ref 0.2–1.0)
CO2: 20 mEq/L — ABNORMAL LOW (ref 21–32)
Calcium: 8 mg/dl — ABNORMAL LOW (ref 8.5–10.1)
Chloride: 108 mEq/L — ABNORMAL HIGH (ref 98–107)
Creatinine: 0.9 mg/dl (ref 0.6–1.3)
GFR est AA: >60
GFR est non-AA: >60
Glucose: 138 mg/dl — ABNORMAL HIGH (ref 74–106)
Potassium: 3.8 mEq/L (ref 3.5–5.1)
Protein, total: 7.2 gm/dl (ref 6.4–8.2)
Sodium: 138 mEq/L (ref 136–145)

## 2014-06-17 LAB — POC CHEM8
BUN: 11 mg/dl (ref 7–25)
CALCIUM,IONIZED: 4.5 mg/dL (ref 4.40–5.40)
CO2, TOTAL: 17 mmol/L — ABNORMAL LOW (ref 21–32)
Chloride: 107 mEq/L (ref 98–107)
Creatinine: 0.8 mg/dl (ref 0.6–1.3)
Glucose: 150 mg/dL — ABNORMAL HIGH (ref 74–106)
HCT: 31 % — ABNORMAL LOW (ref 38–45)
HGB: 10.5 gm/dl — ABNORMAL LOW (ref 12.4–17.2)
Potassium: 3.8 mEq/L (ref 3.5–4.9)
Sodium: 137 mEq/L (ref 136–145)

## 2014-06-17 LAB — VITAMIN B12: Vitamin B12: 239 pg/ml (ref 193–986)

## 2014-06-17 LAB — CBC WITH AUTOMATED DIFF
BASOPHILS: 0.1 % (ref 0–3)
EOSINOPHILS: 0.4 % (ref 0–5)
HCT: 28.8 % — ABNORMAL LOW (ref 37.0–50.0)
HGB: 10 gm/dl — ABNORMAL LOW (ref 13.0–17.2)
IMMATURE GRANULOCYTES: 1.1 % (ref 0.0–3.0)
LYMPHOCYTES: 23.5 % — ABNORMAL LOW (ref 28–48)
MCH: 28.9 pg (ref 25.4–34.6)
MCHC: 34.7 gm/dl (ref 30.0–36.0)
MCV: 83.2 fL (ref 80.0–98.0)
MONOCYTES: 15.6 % — ABNORMAL HIGH (ref 1–13)
MPV: 10.1 fL — ABNORMAL HIGH (ref 6.0–10.0)
NEUTROPHILS: 59.3 % (ref 34–64)
NRBC: 0 (ref 0–0)
PLATELET: 283 10*3/uL (ref 140–450)
RBC: 3.46 M/uL — ABNORMAL LOW (ref 3.60–5.20)
RDW-SD: 46.9 — ABNORMAL HIGH (ref 36.4–46.3)
WBC: 10.1 10*3/uL (ref 4.0–11.0)

## 2014-06-17 LAB — FOLATE: Folate: 12.6 ng/ml (ref 3.1–17.5)

## 2014-06-17 LAB — EKG, 12 LEAD, INITIAL
Atrial Rate: 79 {beats}/min
Calculated P Axis: 62 degrees
Calculated R Axis: -36 degrees
Calculated T Axis: 51 degrees
Diagnosis: NORMAL
P-R Interval: 150 ms
Q-T Interval: 380 ms
QRS Duration: 64 ms
QTC Calculation (Bezet): 435 ms
Ventricular Rate: 79 {beats}/min

## 2014-06-17 LAB — POC URINE MACROSCOPIC
Bilirubin: NEGATIVE
Blood: NEGATIVE
Glucose: NEGATIVE mg/dl
Ketone: NEGATIVE mg/dl
Leukocyte Esterase: NEGATIVE
Nitrites: NEGATIVE
Protein: NEGATIVE mg/dl
Specific gravity: <=1.005 (ref 1.005–1.030)
Urobilinogen: 0.2 EU/dl (ref 0.0–1.0)
pH (UA): 5.5 (ref 5–9)

## 2014-06-17 LAB — VITAMIN D, 25 HYDROXY: Vitamin D, 25-OH, Total: 10.1 ng/ml — ABNORMAL LOW (ref 30.0–100.0)

## 2014-06-17 LAB — IRON PROFILE
Iron % saturation: 12 % — ABNORMAL LOW (ref 20–45)
Iron: 44 ug/dL — ABNORMAL LOW (ref 50–170)
TIBC: 381 ug/dL (ref 250–450)

## 2014-06-17 LAB — POC TROPONIN: Troponin-I: 0.02 ng/ml (ref 0.00–0.07)

## 2014-06-17 LAB — GLUCOSE, POC
Glucose (POC): 123 mg/dL — ABNORMAL HIGH (ref 65–105)
Glucose (POC): 125 mg/dL — ABNORMAL HIGH (ref 65–105)

## 2014-06-17 LAB — LIPASE: Lipase: 493 U/L — ABNORMAL HIGH (ref 73–393)

## 2014-06-17 LAB — TROPONIN I: Troponin-I: <0.015 ng/ml (ref 0.00–0.09)

## 2014-06-17 MED ORDER — MEGESTROL 400 MG/10 ML ORAL SUSP
400 mg/10 mL (10 mL) | Freq: Three times a day (TID) | ORAL | Status: DC
Start: 2014-06-17 — End: 2014-06-17

## 2014-06-17 MED ORDER — MORPHINE 4 MG/ML SYRINGE
4 mg/mL | INTRAMUSCULAR | Status: AC
Start: 2014-06-17 — End: 2014-06-17
  Administered 2014-06-17: 07:00:00 via INTRAVENOUS

## 2014-06-17 MED ORDER — NALOXONE 0.4 MG/ML INJECTION
0.4 mg/mL | INTRAMUSCULAR | Status: DC | PRN
Start: 2014-06-17 — End: 2014-06-18

## 2014-06-17 MED ORDER — OMEPRAZOLE 20 MG CAP, DELAYED RELEASE
20 mg | Freq: Every day | ORAL | Status: DC
Start: 2014-06-17 — End: 2014-06-18
  Administered 2014-06-18: 12:00:00 via ORAL

## 2014-06-17 MED ORDER — POTASSIUM CHLORIDE SR 20 MEQ TAB, PARTICLES/CRYSTALS
20 mEq | Freq: Two times a day (BID) | ORAL | Status: DC
Start: 2014-06-17 — End: 2014-06-18
  Administered 2014-06-17 – 2014-06-18 (×2): via ORAL

## 2014-06-17 MED ORDER — HYDROCODONE-ACETAMINOPHEN 5 MG-325 MG TAB
5-325 mg | ORAL | Status: DC | PRN
Start: 2014-06-17 — End: 2014-06-18
  Administered 2014-06-17 – 2014-06-18 (×2): via ORAL

## 2014-06-17 MED ORDER — CAPTOPRIL-HYDROCHLOROTHIAZIDE 25 MG-15 MG TAB
25-15 mg | Freq: Every day | ORAL | Status: DC
Start: 2014-06-17 — End: 2014-06-18
  Administered 2014-06-18: 12:00:00 via ORAL

## 2014-06-17 MED ORDER — SIMVASTATIN 40 MG TAB
40 mg | Freq: Every evening | ORAL | Status: DC
Start: 2014-06-17 — End: 2014-06-18
  Administered 2014-06-18: 04:00:00 via ORAL

## 2014-06-17 MED ORDER — MEGESTROL 400 MG/10 ML ORAL SUSP
400 mg/10 mL (10 mL) | Freq: Two times a day (BID) | ORAL | Status: DC
Start: 2014-06-17 — End: 2014-06-18
  Administered 2014-06-18: 04:00:00 via ORAL

## 2014-06-17 MED ORDER — ENOXAPARIN 40 MG/0.4 ML SUB-Q SYRINGE
40 mg/0.4 mL | SUBCUTANEOUS | Status: DC
Start: 2014-06-17 — End: 2014-06-18
  Administered 2014-06-17: 19:00:00 via SUBCUTANEOUS

## 2014-06-17 MED ORDER — ERGOCALCIFEROL (VITAMIN D2) 50,000 UNIT CAP
1250 mcg (50,000 unit) | ORAL | Status: DC
Start: 2014-06-17 — End: 2014-06-18
  Administered 2014-06-17: 19:00:00 via ORAL

## 2014-06-17 MED ORDER — SODIUM CHLORIDE 0.9 % IV PIGGY BACK
100 mg iron/5 mL | Freq: Every day | INTRAVENOUS | Status: DC
Start: 2014-06-17 — End: 2014-06-18
  Administered 2014-06-18: 14:00:00 via INTRAVENOUS

## 2014-06-17 MED ORDER — ONDANSETRON (PF) 4 MG/2 ML INJECTION
4 mg/2 mL | Freq: Once | INTRAMUSCULAR | Status: AC
Start: 2014-06-17 — End: 2014-06-17
  Administered 2014-06-17: 07:00:00 via INTRAVENOUS

## 2014-06-17 MED ORDER — GABAPENTIN 300 MG CAP
300 mg | Freq: Three times a day (TID) | ORAL | Status: DC
Start: 2014-06-17 — End: 2014-06-18
  Administered 2014-06-17 – 2014-06-18 (×3): via ORAL

## 2014-06-17 MED ORDER — ALBUTEROL SULFATE 2.5 MG/0.5 ML NEB SOLUTION
2.5 mg/0.5 mL | RESPIRATORY_TRACT | Status: DC | PRN
Start: 2014-06-17 — End: 2014-06-18

## 2014-06-17 MED ORDER — HYDROCODONE-ACETAMINOPHEN 5 MG-325 MG TAB
5-325 mg | ORAL | Status: DC | PRN
Start: 2014-06-17 — End: 2014-06-17

## 2014-06-17 MED ORDER — ALBUTEROL SULFATE HFA 90 MCG/ACTUATION AEROSOL INHALER
90 mcg/actuation | Freq: Four times a day (QID) | RESPIRATORY_TRACT | Status: DC | PRN
Start: 2014-06-17 — End: 2014-06-17

## 2014-06-17 MED ORDER — ASPIRIN 81 MG CHEWABLE TAB
81 mg | Freq: Every day | ORAL | Status: DC
Start: 2014-06-17 — End: 2014-06-18

## 2014-06-17 MED ORDER — CYANOCOBALAMIN (VITAMIN B-12) 2,500 MCG SUBLINGUAL TAB
2500 mcg | Freq: Every day | SUBLINGUAL | Status: DC
Start: 2014-06-17 — End: 2014-06-18

## 2014-06-17 MED ORDER — NICOTINE 7 MG/24 HR DAILY PATCH
7 mg/24 hr | TRANSDERMAL | Status: DC
Start: 2014-06-17 — End: 2014-06-18

## 2014-06-17 MED FILL — CAPTOPRIL-HYDROCHLOROTHIAZIDE 25 MG-15 MG TAB: 25-15 mg | ORAL | Qty: 1

## 2014-06-17 MED FILL — VENOFER 100 MG IRON/5 ML INTRAVENOUS SOLUTION: 100 mg iron/5 mL | INTRAVENOUS | Qty: 10

## 2014-06-17 MED FILL — ONDANSETRON (PF) 4 MG/2 ML INJECTION: 4 mg/2 mL | INTRAMUSCULAR | Qty: 2

## 2014-06-17 MED FILL — NICOTINE 7 MG/24 HR DAILY PATCH: 7 mg/24 hr | TRANSDERMAL | Qty: 1

## 2014-06-17 MED FILL — HYDROCODONE-ACETAMINOPHEN 5 MG-325 MG TAB: 5-325 mg | ORAL | Qty: 1

## 2014-06-17 MED FILL — MORPHINE 4 MG/ML SYRINGE: 4 mg/mL | INTRAMUSCULAR | Qty: 1

## 2014-06-17 MED FILL — LOVENOX 40 MG/0.4 ML SUBCUTANEOUS SYRINGE: 40 mg/0.4 mL | SUBCUTANEOUS | Qty: 0.4

## 2014-06-17 MED FILL — KLOR-CON M20 MEQ TABLET,EXTENDED RELEASE: 20 mEq | ORAL | Qty: 1

## 2014-06-17 MED FILL — GABAPENTIN 300 MG CAP: 300 mg | ORAL | Qty: 2

## 2014-06-17 MED FILL — VITAMIN D2 1,250 MCG (50,000 UNIT) CAPSULE: 1250 mcg (50,000 unit) | ORAL | Qty: 1

## 2014-06-17 MED FILL — VENTOLIN HFA 90 MCG/ACTUATION AEROSOL INHALER: 90 mcg/actuation | RESPIRATORY_TRACT | Qty: 18

## 2014-06-17 NOTE — ED Notes (Signed)
Pt brought by EMS c/o R side chest pain, back pain, reports increased pain on movement

## 2014-06-17 NOTE — ED Provider Notes (Signed)
[facility]  EMERGENCY DEPARTMENT TREATMENT REPORT  NAME:  Wanda Doyle  SEX:   F  ADMIT: 06/17/2014  DOB:   April 09, 1955  MR#    [id number]  ROOM:  6626  TIME DICTATED: 05 31 PM  ACCT#  [id number]        I hereby certify this patient for admission based upon medical necessity as   noted below:      CHIEF COMPLAINT:  Chest pain.    HISTORY OF PRESENT ILLNESS:  The patient is a 59 year old female who presents with chest pain.  She states   she has been having right-sided chest pain all day today.  She was having   left-sided chest pain yesterday.  She reports some nausea.  She reports   chronic low back pain.  She denies that her back pain is any worse.  She   states that she is out of her regular prescription of Norco.  She reports that   she has had a right-sided CVA and has some dysarthria because of that, and   chronic pain in her right lower extremity.     She is complaining of stabbing chest pains.  She denies any shortness of   breath.  She denies any fevers, chills, malaise.  She denies any heart or lung   past medical history.  She denies any lower extremity edema or history of   blood clots.    REVIEW OF SYSTEMS:  CONSTITUTIONAL:  No fever, chills, or weight loss.  ENT:  No sore throat, runny nose, or other URI symptoms.  RESPIRATORY:  No cough, shortness of breath, or wheezing.  CARDIOVASCULAR:  Complains of chest pain.  Denies chest pressure,   palpitations, edema.  GASTROINTESTINAL:  She complains of nausea.  Denies any diarrhea or abdominal   pain.  GENITOURINARY:  Denies any dysuria, urgency, frequency.  MUSCULOSKELETAL:  She complains of low back pain.  SKIN:  No rashes, abrasions, ecchymosis.  NEUROLOGIC:  Denies any headache, dizziness, syncope, paralysis.  Denies complaints in all other systems.    PAST MEDICAL HISTORY:  CVA, hysterectomy, spinal stenosis, hepatitis C, seizures.    MEDICATIONS:  List was reviewed per Epic.     ALLERGIES:   PENICILLINS.    PHYSICAL EXAMINATION:   VITAL SIGNS:  Blood pressure is 117/70, pulse 79, respirations 16, temperature   98.2, O2 sats are 99% on room air.  GENERAL:  This is a 59 year old female.  She is in no acute distress.  She is   no longer in any pain, but she was given nitro from EMS.  HEENT:  Head is symmetric, atraumatic.  Eyes:  Conjunctivae clear, lids   normal.  Pupils equal, symmetrical, and normally reactive.  Mouth/Throat:    Surfaces of the pharynx, palate, and tongue are pink, moist, and without   lesions.   HEART:  Regular rate and rhythm without any murmurs, rubs or gallops.  RESPIRATORY:  Somewhat diminished at the bases, but no wheezes, rales, rhonchi   and breath sounds in all lung fields.  CHEST:  Symmetric and nontender.  GASTROINTESTINAL:  Abdomen soft, nontender, nondistended.  No epigastric   tenderness.  No right upper quadrant tenderness.  No pulsatile masses.  No   distention.  MUSCULOSKELETAL:  She moves all of her extremities with ease.  She has a   little bit of weakness in the right lower extremity and the right upper   extremity, but no deformities.  No injuries.   SKIN:  Clean, dry and intact.  No calf tenderness.  She has normal perfusion   into her extremities.  NEUROLOGIC:  She is alert.  She is oriented.  She has no facial asymmetries.    She has some dysarthria, which is her baseline.  She has some weakness in her   right upper and lower extremities, which is also normal, but she is able to   move all of her extremities.  PSYCHIATRIC:  Judgment appears appropriate.  Recent and remote memory appear   to be intact.  Oriented to time, place and person.  Mood and affect   appropriate.     INITIAL ASSESSMENT:  The patient was seen and examined.  I reviewed her past medical history   including [facility] visits.  She has a history of spinal stenosis, chronic pain   and history of drug seeking.  She readily admits that she is out of her pain   medication at home and this may be the primary motivation for her visit today;    however, she does have chest pain.  Patient with chest pain.  Acute ischemic   coronary disease must be considered first, and the patient protected against   the consequences of same, while other etiologies (including infectious,   pulmonary, gastrointestinal, and musculoskeletal) are considered. Workup was   initiated.  I do not see that she has had visits for chest pain in the past   and we need to rule out a cardiopulmonary source of her pain.  I also tacked   on a CMP and a lipase to rule out a GI source.  The patient is stable, she looks great despite complaints of pain.      CONTINUATION BY Kadin Canipe, MD:    COURSE IN THE EMERGENCY DEPARTMENT:    The patient was seen and evaluated by me.  Briefly, a 59 year old female   presents to the ER today.  I think she had some components of chronic pain,   but she specifically states having pain in her right chest.  She is having   some in her right back.  Risk factors for heart disease include stroke, the   pain is not pleuritic.  Today, a cardiac workup was initiated.     DIAGNOSTIC INTERPRETATION:    The patient had an EKG that was seen and reviewed by me.  There were no   significant ST abnormalities suggestive of acute ischemia.  Her white count   was 10.1, H&H and platelets were unremarkable.  Her urine was negative for   infection.  Her chemistry revealed a mild low CO2 at 20, mildly abnormal   lipase is 493.  Otherwise unremarkable.      COURSE IN THE EMERGENCY DEPARTMENT:    The patient remained in stable condition.  She had a chest x-ray that showed   no acute disease.  Because of her mild abnormal lipase I did order an   ultrasound of her gallbladder.  She was discussed with physician on-call for   [facility] and admitted to their service.     ADMISSION DIAGNOSES:  1.  Chest pain.  2.  Mildly abnormal lipase.      DISPOSITION:   The patient is admitted in stable condition.      ___________________  Candace Cruise MD  Dictated By: Crecencio Mc, PA-C     My signature above authenticates this document and my orders, the final   diagnosis (es), discharge prescription (s), and instructions  in the Epic   record.  If you have any questions please contact [phone].    Nursing notes have been reviewed by the physician/ advanced practice   clinician.    LC  D:06/17/2014 17:31:23  T: 06/17/2014 21:45:37  [id number]

## 2014-06-17 NOTE — Other (Signed)
TRANSFER - OUT REPORT:    Verbal report given to JANET, RN(name) on Wanda Doyle  being transferred to 6626(unit) for routine progression of care       Report consisted of patient???s Situation, Background, Assessment and   Recommendations(SBAR).     Information from the following report(s) SBAR, ED Summary, Procedure Summary, Intake/Output, MAR, Recent Results, Med Rec Status and Cardiac Rhythm NORMAL SINUS was reviewed with the receiving nurse.    Lines:   Peripheral IV 06/17/14 Right Forearm (Active)   Site Assessment Clean, dry, & intact 06/17/2014  7:07 AM   Phlebitis Assessment 0 06/17/2014  7:07 AM   Infiltration Assessment 0 06/17/2014  7:07 AM   Dressing Status Clean, dry, & intact 06/17/2014  7:07 AM   Dressing Type 4 X 4 06/17/2014  7:07 AM   Hub Color/Line Status Green;Flushed;Capped 06/17/2014  7:07 AM        Opportunity for questions and clarification was provided.      Patient transported with:   [employer]

## 2014-06-17 NOTE — Telephone Encounter (Signed)
Requested Prescriptions     Pending Prescriptions Disp Refills   ??? potassium chloride (KLOR-CON) 20 mEq packet       Sig: Take 1 Packet by mouth daily.     Also requesting levetiracetam 500 mg. Lemmie Evens states pt is currently hospitalized @ [facility] for chest pain. Stated being monitored.

## 2014-06-17 NOTE — ED Notes (Signed)
Pt to the bathroom, assistance offered, pt in no distress

## 2014-06-17 NOTE — Other (Signed)
----------  DocumentID:   [id number]------------------------------------------------              [facility]                       Patient Education Report         Name: [name], Wanda                  Date: 06/17/2014    MRN: [id number]                    Time: 8:58:50 AM         Patient ordered video: Patient Safety: Stay Safe While you are in the   Hospital    from [id number] via phone number: [phone] at 8:58:50 AM

## 2014-06-17 NOTE — H&P (Signed)
[facility]  History and Physical  NAME:  Wanda Doyle, Wanda Doyle  SEX:   F  ADMIT: 06/17/2014  DOB:Oct 21, 1955  MR#    [id number]  ROOM:  [id number]  ACCT#  [id number]    I hereby certify this patient for admission based upon medical necessity as   noted below:    <    CHIEF COMPLAINT:  Abdominal pain.    HISTORY OF PRESENT ILLNESS:  The patient is a 59 year old female with multiple medical problems, who was in   her usual health until 2 days ago when she started having some abdominal pain   usually right upper and epigastric, severe, rated pain as 8 to 9 out of 10,   pain will get worse after eating.  Pain lasted for a couple hours and then got   better, but last night she started having pain again which made her come to   the emergency room.  There was no associated nausea, vomiting, diarrhea or any   fever, no cough, no shortness of breath.    REVIEW OF SYSTEMS:  Significant for some back pain and hip pain.  A 12-point review of system was   done.    PAST MEDICAL HISTORY:  Hypertension, high cholesterol, arthritis, chronic back pain, diabetes,   history of stroke.    PAST SURGICAL HISTORY:  Negative.    FAMILY HISTORY:  Hypertension, diabetes.    PERSONAL SOCIAL HISTORY:  The patient is a widow, smokes half pack per day, drinks alcohol occasionally,   lives by herself in a motel.    CURRENT MEDICATION:  According to med list, patient was taking albuterol inhaler 4 times a day,   nebulizer 4 times a day, aspirin 81 once a day, Neurontin 600 three times a   day, Norco 5 every 4 hours as needed, Zestoretic 10/12.5 once a day, magnesium   400 three times a day, nicotine patch 7 mg once a day, Prilosec 20 once a   day, potassium 20 once a day, Zocor 40 once a day.    ALLERGIES:  PENICILLIN.    PHYSICAL EXAMINATION:  GENERAL:  This showed a middle-aged female, well built, who was looking   anxious.  SKIN:  Warm and dry.  HEENT:  Normocephalic, atraumatic.   NECK:  Supple, no neck vein distention, no carotid bruit, no thyromegaly.    CHEST:  Symmetrical, normal expansion.  HEART:  Normal S1 and S2, regular rate and rhythm, no murmur, no S3.  LUNGS:  Clear.  ABDOMEN:  Soft, moderate tenderness in the epigastric area.  No guarding, no   rigidity.  Murphy's sign was negative.  BACK AND SPINE:  Unremarkable.  EXTREMITIES:  Showed no edema, no calf tenderness.    PSYCHIATRIC:  The patient was somewhat anxious.  NEUROLOGICAL:  The patient was awake, alert. Speech was somewhat slurred which   she says she has been like this for several years.     PERTINENT LABS:   CBC:  Hemoglobin 10, hematocrit 28.8.  Chemistry was significant for slightly   elevated lipase. Ultrasound of the gallbladder showed some fatty liver.    ASSESSMENT:  1.  Abdominal pain.  2.  Tobacco use disorder.  3.  Hypertension.  4.  Chronic back pain.  5.  Vitamin D deficiency.  6.  Anemia of iron deficiency.    MANAGEMENT PLAN:  The patient being admitted to regular floor on telemetry for observation.  The   patient will be continued on her home  medication.  We will order serial   cardiac markers and EKGs.  We will order a HIDA scan. The patient was strongly   advised to quit smoking.Time spent regarding smoking cessation counseling was   over 3 min. If the patient remains stable, keeps improving and workup is negative,   she can be discharged home in the next 24 to 48 hours.      ___________________  Hazel Sams MD  Dictated By: .   Davonna Belling  D:06/17/2014 13:49:42  T: 06/17/2014 14:38:43  [id number]

## 2014-06-17 NOTE — ED Notes (Signed)
Pt ambulated to the bathroom, pt in no distress

## 2014-06-17 NOTE — Other (Signed)
Bedside and Verbal shift change report given to Sarah RN  (oncoming nurse) by Dickie La RN (offgoing nurse). Report included the following information SBAR, Kardex and Cardiac Rhythm NSR on tele.

## 2014-06-17 NOTE — Progress Notes (Signed)
Tentative dc plan:   Discharge plan to home need pending PT/OT eval as patient has hx of stroke.     PCP: ERIC Karsten Ro, MD     Pharmacy[facility]    DME: quad cane    Home Environment: Lives at [facility] [address]  [place] [address] [phone].     Prior to admission open services:na    Extended Emergency Contact Information  Primary Emergency Contact: Capili,Ethelene  Home Phone: [phone]  Relation: Other Relative  Secondary Emergency Contact: Law,Micheal  Address: [address]           [place], [place] [address] [place]  Home Phone: [phone]  Relation: Life Partner     Transportation: Friend will transport home    Therapy Recommendations: pending recommendation    OT =     PT =     SLP =      RT Home O2 Evaluation =      Wound Care =        Case Management Assessment        Preferred Language for Healthcare Related Communication         Spiritual/Ethnic/Cultural/Religious Needs that Should be Incorporated Into Your Care          FUNCTIONAL ASSESSMENT   Fall in Past 12 Months: Yes   Fall With Injury: No   Number of Falls Within 12 Months: 3   Approximate Date of Fall:  (this year x3)   Decline in Gait/Transfer/Balance: No       Decline in Capacity to Feed/Dress/Bathe: No   Developmental Delay: Yes (comment) (speech is slow and hard to understand)   Chewing/Swallowing Problems: No      DYSPHAGIA SCREENING                          Difficulty with Secretions     Difficulty with Secretions: No      Speech Slurred/Thick/Garbled     Speech Slurred/Thick/Garbled: No      ABUSE/NEGLECT SCREENING   Physical Abuse/Neglect: Denies   Sexual Abuse: Denies   Sexual Abuse: Denies   Other Abuse/Issues: Denies          PRIMARY DECISION MAKER   Primary Decision Maker Name: brienna biesecker     Primary Decision Maker Phone Number: [phone]   Primary Decision Maker Address: [place] [address] [place] [phone]    Primary Decision Maker Relationship to Patient: Unknown   Secondary Decision Maker Name: none   Secondary Decision Maker Name: none              ADVANCE CARE PLANNING (ACP) DOCUMENTS   Confirm Advance Directive: None       Does the patient have other document types: Other (comment)      Suicide/Psychosocial Screening   Primary Diagnosis or Primary Complaint of an Emotional Behavior Disorder: No   Patient is Currently Experiencing Depression: No   Suicidal Ideation/Attempts: No   Homicidal Ideation/Attempts: No   Alcohol/Drug Intoxication: No   Hallucinations/Delusions: No   Pending, Active, or Temporary Detention Orders: No   Aggressive/Inappropriate Behavior: No      SAD PERSONS  READMIT RISK TOOL   Support Systems: Friends \\ neighbors Kathlene November Low @ [phone])   Relationship with Primary Physician Group: Seen at least one time within the past 6 months   History of Falls Within Past 3 Months: Yes   Needs Assistance with Wound Care AND/OR Mgnt of O2, Nebulizer: Yes   Requires Financial, Physical and/or Educational Assistance With Medications: Yes   History of Mental Illness: No   Living Alone: Yes      CARE MANAGEMENT INTERVENTIONS   Readmission Interview Completed: Not Applicable   PCP Verified by CM: Yes (Dr. Yetta Barre)   Last Visit to PCP: 05/28/14   Palliative Care Consult: No                                           Current Support Network: Lives Alone   Reason for Referral: DCP Rounds   History Provided By: Patient   Patient Orientation: Alert and Oriented   Cognition: Alert       Previous Living Arrangement: Lives Alone Independent       Prior Functional Level: Independent in ADLs/IADLs, Mobility (quad cane)   Current Functional Level: Independent in ADLs/IADLs, Mobility   Primary Language: English   Can patient return to prior living arrangement: Yes   Ability to make needs known:: Good       Pets: None                                      DISCHARGE LOCATION    Discharge Placement: Home (lives in a hotel)

## 2014-06-18 ENCOUNTER — Observation Stay
Admit: 2014-06-18 | Payer: PRIVATE HEALTH INSURANCE | Primary: Student in an Organized Health Care Education/Training Program

## 2014-06-18 ENCOUNTER — Telehealth

## 2014-06-18 LAB — TROPONIN I: Troponin-I: <0.015 ng/ml (ref 0.00–0.09)

## 2014-06-18 MED ORDER — CYANOCOBALAMIN (VITAMIN B-12) 2,500 MCG SUBLINGUAL TAB
2500 mcg | ORAL_TABLET | Freq: Every day | SUBLINGUAL | Status: AC
Start: 2014-06-18 — End: ?

## 2014-06-18 MED ORDER — TECHNETIUM TC 99M MEBROFENIN
Freq: Once | Status: AC
Start: 2014-06-18 — End: 2014-06-18
  Administered 2014-06-18: 15:00:00 via INTRAVENOUS

## 2014-06-18 MED ORDER — ERGOCALCIFEROL (VITAMIN D2) 50,000 UNIT CAP
1250 mcg (50,000 unit) | ORAL_CAPSULE | ORAL | Status: AC
Start: 2014-06-18 — End: ?

## 2014-06-18 MED ORDER — POTASSIUM CHLORIDE 20 MEQ ORAL PACKET FOR SOLUTION
20 mEq | Freq: Every day | ORAL | Status: DC
Start: 2014-06-18 — End: 2014-06-30

## 2014-06-18 MED ORDER — IRON AG-C-B12-CATHR-SUCC.ACID-STOMCH 70 MG-150 MG-10 MCG-2 MG-75MG TAB
70 mg-150 mg-10 mcg-2 mg-75 mg | ORAL_TABLET | Freq: Two times a day (BID) | ORAL | Status: AC
Start: 2014-06-18 — End: ?

## 2014-06-18 MED ORDER — OMEPRAZOLE 20 MG CAP, DELAYED RELEASE
20 mg | ORAL_CAPSULE | Freq: Two times a day (BID) | ORAL | Status: AC
Start: 2014-06-18 — End: ?

## 2014-06-18 MED FILL — HYDROCODONE-ACETAMINOPHEN 5 MG-325 MG TAB: 5-325 mg | ORAL | Qty: 1

## 2014-06-18 MED FILL — SIMVASTATIN 40 MG TAB: 40 mg | ORAL | Qty: 1

## 2014-06-18 MED FILL — KLOR-CON M20 MEQ TABLET,EXTENDED RELEASE: 20 mEq | ORAL | Qty: 1

## 2014-06-18 MED FILL — MEGESTROL 400 MG/10 ML ORAL SUSP: 400 mg/10 mL (10 mL) | ORAL | Qty: 10

## 2014-06-18 MED FILL — TECHNETIUM TC 99M MEBROFENIN: Qty: 6

## 2014-06-18 MED FILL — VITAMIN B-12  2,500 MCG SUBLINGUAL TABLET: 2500 mcg | SUBLINGUAL | Qty: 1

## 2014-06-18 MED FILL — OMEPRAZOLE 20 MG CAP, DELAYED RELEASE: 20 mg | ORAL | Qty: 1

## 2014-06-18 MED FILL — ASPIRIN 81 MG CHEWABLE TAB: 81 mg | ORAL | Qty: 1

## 2014-06-18 MED FILL — GABAPENTIN 300 MG CAP: 300 mg | ORAL | Qty: 2

## 2014-06-18 MED FILL — VENOFER 100 MG IRON/5 ML INTRAVENOUS SOLUTION: 100 mg iron/5 mL | INTRAVENOUS | Qty: 10

## 2014-06-18 NOTE — Progress Notes (Signed)
Care Management Interventions  PCP Verified by CM: Yes (Dr. Yetta Barre)  Last Visit to PCP: 05/28/14  Palliative Care Consult: No  Current Support Network: Lives Alone  Discharge Location  Discharge Placement: Home (lives in a hotel)

## 2014-06-18 NOTE — Progress Notes (Signed)
ADULT MALNUTRITION GUIDELINES    Patient is a 59 y.o. female, admitted on 06/17/2014 with a diagnosis of chest pain  chest pain.  Nutrition assessment was completed by RD and the patient was found to meet the following malnutrition criteria established by ASPEN/AND:    Adult Malnutrition Guidelines:  MALNUTRITION OF MODERATE DEGREE - NON-SEVERE MALNUTRITION IN THE CONTEXT OF CHRONIC ILLNESS  Energy Intake: <75% energy intake compared to estimated energy needs >3 months  Muscle Mass: Moderate depletion    Intervention:  Rec 1800 calorie diabetic, low-fat diet. Once lipase is WNL, can remove low-fat portion of diet.     Please document MALNUTRITION in Problem List if in agreement.    Arnitra Sokoloski MCKAY CONWAY, MS, RD, CNSC  06/18/2014   Pager [phone]

## 2014-06-18 NOTE — Telephone Encounter (Signed)
Attempted to reach Wanda Doyle, unable to reach due to pt being in the hospital.

## 2014-06-18 NOTE — Discharge Summary (Signed)
[facility]  Discharge Summary   NAME:  Wanda Doyle  SEX:   F  ADMIT: 06/17/2014  DISCH:   DOB:   09-05-1955  MR#    [id number]  ACCT#  [id number]    cc: Jerolyn Shin MD    DATE OF ADMISSION:  06/17/2014.    DATE OF DISCHARGE:   06/18/2014.    FINAL DIAGNOSES:  1.  Abdominal pain, most likely gastroesophageal reflux disease.  2.  Anemia of iron deficiency.  3.  Hypertension.  4.  Vitamin D deficiency.  5.  Chronic back pain.  6.  Tobacco use disorder.    HOSPITAL COURSE:  The patient is a 59 year old female who presented with a chief complaint of   abdominal pain, which was epigastric and right upper, severe, associated with   some nausea.  The pain will get worse with eating, which made her to come to   the emergency room.  The patient was admitted to regular floor on telemetry   for observation.  She had serial cardiac markers and EKGs done.  She was ruled   out for MI.  During the hospital stay the patient remained stable.  HIDA scan   was ordered which was normal .she is being discharged home with followup   appointment with her primary care provider in a week.    CONDITION AT DISCHARGE:  Stable.    ACTIVITY:  As tolerated.    DIET:  Regular diet.  The patient was strongly advised to quit smoking.    DISCHARGE MEDICATIONS:  The patient will continue albuterol inhaler as needed for shortness of breath,   aspirin 81 mg once a day, Neurontin 600 three times a day, Norco 5 as needed   for pain, Zestoretic 10/12.5 once a day, Megace 3 times a day, nicotine patch   once a day, Prilosec 20 was increased to twice a day, potassium 20 mEq once a   day, Zocor 40 once a day.  The patient was given prescription for vitamin B12   at 2500 mcg by mouth once a day, vitamin D 50,000 units once a week.  The   patient was advised to have repeat vitamin D, B12 and CBC checked in 3 months.    The patient was also given prescription for Multigen Plus twice a day.       The patient was told if she develops any new symptoms she can come back to the   emergency room or call primary care provider.  A copy of this discharge   summary will be sent to primary care provider.      ___________________  Hazel Sams MD  Dictated By: .   NS  D:06/18/2014 91:79:15  T: 06/18/2014 13:01:35  [id number]

## 2014-06-18 NOTE — Telephone Encounter (Signed)
Pt should call Neuro for Keppra refill. Encounter will be closed.

## 2014-06-18 NOTE — Progress Notes (Signed)
Bedside shift change report given to Gunnar Fusi (Cabin crew) by Maralyn Sago (offgoing nurse). Report included the following information SBAR and Kardex.

## 2014-06-18 NOTE — Progress Notes (Signed)
NPO Post NOC r/t hepatobiliary duct scan. Pt. Compliant. NPO reinforced and maintained. RN in am made aware

## 2014-06-18 NOTE — Progress Notes (Signed)
NUTRITION RECOMMENDATIONS:   Rec 1800 calorie diabetic, low-fat diet. Once lipase is WNL, can remove low-fat portion of diet.     NUTRITION INITIAL EVALUATION    NUTRITION ASSESSMENT:       Reason for assessment: RN screen- unsure wt loss    Admitting diagnosis: chest pain  chest pain, abd pain       PMH:   Past Medical History   Diagnosis Date   ??? Seizures (HCC)    ??? Hypertension    ??? Stroke (HCC)         Anthropometrics:  Height: Ht Readings from Last 3 Encounters:   06/17/14 5\' 5"  (1.651 m)   05/28/14 5\' 5"  (1.651 m)   02/10/14 5\' 5"  (1.651 m) ??       Weight: Wt Readings from Last 3 Encounters:   06/17/14 51.71 kg (114 lb)   05/28/14 51.71 kg (114 lb)   02/10/14 53.797 kg (118 lb 9.6 oz) ??       ?? IBW: 57kg      ?? % IBW: 91%    ?? BMI: Body mass index is 18.97 kg/(m^2).    ?? UBW: 49kg ~1 year ago, up to 54kg in February, now down to 52kg    ?? Wt change: +6% x 1 year but -4% x 4 months. Wt loss unintended, not significant.    Diet and intake history:  ?? Current diet order: DIET NPO    ?? Food allergies: NKFA    ?? Diet/intake history: Regular diet at home. Chronically decreased appetite. On Megace for 1+ years.    [ ]  <50% intake x >5 days  [ ]  <50% intake x >1 month  [ ]  <75% intake x >7 days  [ ]  <75% intake x 1 month  [x]  <75% intake x 3 months    ?? Current appetite/PO intake: N/A     ?? Assessment of current MNT: Currently NPO which is inadequate to meet needs. Resume PO when medically able.    ?? Cultural, religious, and ethnic food preferences identified: N/A    Physical Assessment:  ?? GI symptoms: abd pain     ?? Chewing/swallowing issues: none. 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    ?? Skin integrity: intact    ?? Muscle wasting: moderate at temple and clavicle    ?? Fluid accumulation: none    ?? Mental status: A+Ox3   Intake and output:    Intake/Output Summary (Last 24 hours) at 06/18/14 1130  Last data filed at 06/18/14 0600   Gross per 24 hour   Intake      0 ml    Output    600 ml   Net   -600 ml       Estimated daily nutrition intake needs:  ?? 1551 - 1810 kcals (30-35 kcals/kg BW)    ?? 72 - 83 g protein (1.4-1.6g/kg BW)    ?? 1551 ml fluid (14ml/kg BW)     Living situation: Lives alone in a motel.     Current pertinent medications: Megace, Klor-Con, Prilosec    Pertinent labs: (6/8) lipase 493: elevated, possible pancreatitis.     Does patient meet malnutrition criteria: yes, chronic, moderate    NUTRITION DIAGNOSIS:     1. Malnutition related to chronic decreased appetite as evidenced by need for long-term appetite stimulant, 4% wt loss x 4 months, physical assessment, and 91% IBW .    NUTRITION INTERVENTION:  Recommended diet: 1800 calorie, diabetic, low-fat diet. Once lipase is WNL, can remove low-fat portion of diet.    NUTRITION MONITORING AND EVALUATION:     Nutrition level of care: moderate    Nutrition monitoring: Initiation of PO, PO intake, diet tolerance and compliance, wt, BS, CMP, hydration, medical changes    Nutrition goals: PO intake >75% once diet initiated, tolerates and complies with diet, BS 100-140, healthy wt gain of 2-4 lbs per month until IBW range is reached       NUTRITION EDUCATION:     No needs at this time. Encourage PO.     Jordi Lacko MCKAY CONWAY, MS, RD, CNSC  06/18/2014   Pager [phone]

## 2014-06-19 LAB — HEMOGLOBIN A1C W/O EAG: Hemoglobin A1c: 3.9 % — ABNORMAL LOW (ref 4.8–6.0)

## 2014-06-19 NOTE — Progress Notes (Signed)
[facility]    Transitional Care Nurse Navigator Note    Hospital Follow Up for Wanda Doyle who was hospitalized at [facility] 6/7-06/18/14 for abdominal pain.  The patient has been discharged to home.    RRAT score: 6  Low    Medical History:     Past Medical History   Diagnosis Date   ??? Seizures (HCC)    ??? Hypertension    ??? Stroke [facility])        Patient presenting symptoms: abdominal pain    Discharge Diagnoses and Course of current Hospitalization written by Dr. Tasia Catchings on 06/18/14 are below in italics.    FINAL DIAGNOSES:  1.  Abdominal pain, most likely gastroesophageal reflux disease.  2.  Anemia of iron deficiency.  3.  Hypertension.  4.  Vitamin D deficiency.  5.  Chronic back pain.  6.  Tobacco use disorder.    HOSPITAL COURSE:  The patient is a 59 year old female who presented with a chief complaint of    abdominal pain, which was epigastric and right upper, severe, associated with    some nausea.  The pain will get worse with eating, which made her to come to    the emergency room.  The patient was admitted to regular floor on telemetry    for observation.  She had serial cardiac markers and EKGs done.  She was ruled   out for MI.  During the hospital stay the patient remained stable.  HIDA scan   was ordered which was normal .she is being discharged home with followup    appointment with her primary care provider in a week.    CONDITION AT DISCHARGE:  Stable.    ACTIVITY:  As tolerated.    DIET:  Regular diet.  The patient was strongly advised to quit smoking.    DISCHARGE MEDICATIONS:  The patient will continue albuterol inhaler as needed for shortness of breath,   aspirin 81 mg once a day, Neurontin 600 three times a day, Norco 5 as needed    for pain, Zestoretic 10/12.5 once a day, Megace 3 times a day, nicotine patch    once a day, Prilosec 20 was increased to twice a day, potassium 20 mEq once a    day, Zocor 40 once a day.  The patient was given prescription for vitamin B12     at 2500 mcg by mouth once a day, vitamin D 50,000 units once a week.  The    patient was advised to have repeat vitamin D, B12 and CBC checked in 3 months.    The patient was also given prescription for Multigen Plus twice a day.      The patient was told if she develops any new symptoms she can come back to the   emergency room or call primary care provider.  A copy of this discharge    summary will be sent to primary care provider.        Significant Lab/Diagnostic Findings:   Lab Results  Component Value Date/Time   WBC 10.1 06/17/2014 02:16 AM   HGB 10.0 06/17/2014 02:16 AM   HCT 28.8 06/17/2014 02:16 AM   PLATELET 283 06/17/2014 02:16 AM   MCV 83.2 06/17/2014 02:16 AM       Lab Results   Component Value Date/Time    SODIUM 138 06/17/2014 04:01 AM    POTASSIUM 3.8 06/17/2014 04:01 AM    CHLORIDE 108 06/17/2014 04:01 AM    CO2 20 06/17/2014 04:01 AM  CO2, TOTAL 17 06/17/2014 02:10 AM    ANION GAP 12 08/08/2013 10:08 AM    GLUCOSE 138 06/17/2014 04:01 AM    BUN 12 06/17/2014 04:01 AM    CREATININE 0.9 06/17/2014 04:01 AM    BUN/CREATININE RATIO 13 05/28/2014 10:20 AM    GFR EST AA >60.0 06/17/2014 04:01 AM    GFR EST NON-AA >60 06/17/2014 04:01 AM    CALCIUM 8.0 06/17/2014 04:01 AM        Medication Reconciliation completed:  Yes.    New medications at discharge include:  Vitamin B 12, vitamin D, Multigen    If multiple admissions, ED visits, check and reviewed PMP:  No.    Barriers to care?  Has no regular transportation     Support System consists of:  Patient lives in a motel with significant other of 22 years.    Advance Medical Directive on file in EMR?  No.    Future Plan for Patient to include communication plan: Apt with Dr. Yetta Barre 07/03/14    Adherence to previous treatment and likelihood for f/u:  Under review.    This represents Transitions of Care because NN spoke with patient and/or caregiver within one business days of discharge.  Pt's TCM follow up appt  is scheduled with Dr. Yetta Barre on Friday 07/03/14 @ 1230  which is within 15 days of discharge.    Called patient on 06/19/14 and verified with 2 identifiers. Due to a CVA patient's speech is garbled and difficult to understand.  Spoke with Lemmie Evens (HIPPA verified) who is patient's partner of 22 years.  Reviewed medications and patient is taking as directed.  Advised of appointment with Dr. Yetta Barre on 07/03/14 at 1230.  Offered earlier but Mr. Law declined citing transportation problems.  Mr. Conni Elliot understands to call the office with questions or concerns.  Offered to provide my direct contact number but Mr. Law declined stating it will be easier to simply call the office.  Will continue to follow.

## 2014-06-26 ENCOUNTER — Encounter

## 2014-06-26 ENCOUNTER — Encounter: Attending: Family | Primary: Student in an Organized Health Care Education/Training Program

## 2014-06-26 ENCOUNTER — Ambulatory Visit
Admit: 2014-06-26 | Discharge: 2014-06-26 | Payer: MEDICARE | Attending: Family | Primary: Student in an Organized Health Care Education/Training Program

## 2014-06-26 DIAGNOSIS — F1120 Opioid dependence, uncomplicated: Secondary | ICD-10-CM

## 2014-06-26 NOTE — Progress Notes (Signed)
Chief Complaint   Patient presents with   ??? Hospital Follow Up     pt currently bein gfollow by a life coach, Miquel Dunn, pt is requesting a letter from physician, stating that Mr. Law is her care take and due to her gait, and medical concerns, that Mr. Law should continue to stay with her.

## 2014-06-26 NOTE — Progress Notes (Signed)
Transition of Care/Hospital Follow up   Internal Medicine/Primary Care  [facility]    Date of Service:  06/26/2014   Patient's Name: Wanda Doyle   Patient's DOB:  11/02/1955     Subjective Hx:       Chief Complaint   Patient presents with   ??? Hospital Follow Up     pt currently bein gfollow by a life coach, Miquel Dunn, pt is requesting a letter from physician, stating that Mr. Law is her care take and due to her gait, and medical concerns, that Mr. Law should continue to stay with her.         Wanda Doyle is a 59 y.o. female with a history of  has a past medical history of Seizures (HCC); Hypertension; and Stroke [facility]).who was seen today for follow up after hospitalization.  Admitted to [facility] medical center on 06/17/2014 and discharged 06/18/2014,FINAL DIAGNOSES:  1.?? Abdominal pain, most likely gastroesophageal reflux disease.  2.?? Anemia of iron deficiency.  3.?? Hypertension.  4.?? Vitamin D deficiency.  5.?? Chronic back pain.  6.?? Tobacco use disorder.    Hospital encounter notes, testing and discharge summary reviewed. Recommended follow up labs in 3 mo.-order placed.    Primary complaint today is pain- which is chronic and unchanged.  Abd pain is resolved.  She is stating more difficulty using her R hand and decreased grip strength and is requesting OT to help her wit this.  Also requesting letter to help with transition to better living conditions as she lives in hotel currently with caregiver who is present and appropriately helpful during visit.  Otherwise feeling much better since hospitalization.    PMHx and Problem list were reviewed and annotated with pertinent information.  Medication list updated as below.     Past Medical History   Diagnosis Date   ??? Seizures (HCC)    ??? Hypertension    ??? Stroke [facility])        Review of Systems (positives in bold)   CONST:   fatigue, malaise, unintentional weight change, appetite change, fevers, chills, rigors   NEURO:   headaches, auras, vision changes, seizures,     dizziness, lightheadeness, orthostasis, loss of consciousness, confusion    numbness/tingling/sensory change, focal weakness, new gait difficulty/imbalance  HEENT: itchy eyes, runny eyes, red eyes, eye watering or discharge,     vision changes, light sensitivity, double vision    ear pain, ear pressure/popping, ear discharge/drainage, hearing change    nosebleeds, sneezing, runny nose, nasal congestion, postnasal drip, altered sense of smell    sore throat, dry mouth,voice change, hoarse voice,      jaw pain, jaw popping, toothache, dysgeusia    difficulty swallowing, oral ulcers or canker sores  CV:      chest pain, palpitations, chest wall pain, orthopnea, PND, edema  PULM:  SOB, wheezing, cough, sputum production, hemoptysis  GI:             nausea, vomiting, dry heaves, abdominal pain,     diarrhea, constipation, greasy stools, blood in stool, pale stools  MS:      focal muscle pain, myalgias, soft tissue swelling,    focal joint pain, diffuse joint aches, joint swelling/redness,     decreased ROM-chronic, joint instability, joint crepitus    neck pain, back pain-chronic  SKIN:        rashes, itching, vesicles, pustules, drainage, cutssores, nonhealing wounds, acne, rosacea, new or changing skin growths, skin tags, warts  ALLERGY: seasonal allergies, itchy eyes, watery eyes, red eyes,     itchy ears, ear pain/pressure, ear popping,     runny nose, sneezing, postnasal drip, sore throat  HEME:  easy bleeding/bruising, bleeding from gums, history of DVT or PE, history of ICH  PSYCH: abnormal mood swings, anxiety, insomnia, hallucinations, anhedonia,       depression, suicidal ideation, homicidal ideation, feelings of hopelessness    substance dependence or abuse, disordered eating, irritability,    difficulty focusing, distractibility, obsessions, compulsions, repetitious behaviors     Physical Assessment:    VS:  BP 140/77 mmHg   Pulse 93   Temp(Src) 98.6 ??F (37 ??C) (Oral)   Resp 18   Ht 5\' 5"  (1.651 m)   Wt 114 lb (51.71 kg)   BMI 18.97 kg/m2   SpO2 96%    General:   Pleasant female, well-nourished, well-groomed, chronic R sided hemiparesis, slurred speech, alert, in no acute distress.     Head:  Normocephalic, atraumatic  Ears:  External ears WNL  Eyes:  EOMI, PERRL, no pain with eye movements    No conjunctival injection, abnormal tearing, discharge, chemosis  Mouth:  MMM  Nose:  External nares WNL  Neck:  Neck supple, no thyromegaly or nodules  Cardiovasc:   Regular rate and rhythm, no murmurs, no rubs, no gallops  Pulmonary:   Clear breath sounds bilaterally, good air movement    No wheezing, no rales, no rhonchi, normal respiratory effort  Abdomen:   Abdomen soft, nontender, nondistended,   Extremities:   No edema, no tenderness with palpation of calves, warm and well-perfused  Neuro:   Alert, conversant, appropriate, following commands, abnormal gait, poor balance  Skin:    No rashes noted.  Toenails without signs of onychomycosis.    Psych:  Affect and behavior are appropriate  Thought process demonstrated is linear and logical, difficult for her to communicate.  Assessment/Plan:        [name] was seen today for transition of care visit.  Follow up-      ICD-10-CM ICD-9-CM    1. Opioid type dependence, continuous (HCC) F11.20 304.01 7-DRUG SCREEN + ETHANOL, UR   2. 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 Hypovitaminosis D E55.9 268.9 VITAMIN D, 1, 25 DIHYDROXY   4. Anemia, unspecified type D64.9 285.9 CBC WITH AUTOMATED DIFF   5. Vitamin B deficiency E53.9 266.9 VITAMIN B12     Will drug screen today, pt states intermittent marijuana use, if negative for meds x marijuana and rx'd meds will continue on her vicodin for chronic pain.  Will follow up Vit D, Vit B and cbc levels in 3 mo.  Letter given per pt request to help with living situation.     The patient states understanding and agrees with the treatment plan. All questions were answered.    Encounter Diagnoses     ICD-10-CM ICD-9-CM   1. Opioid type dependence, continuous (HCC) F11.20 304.01   2. 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 Hypovitaminosis D E55.9 268.9   4. Anemia, unspecified type D64.9 285.9   5. Vitamin B deficiency E53.9 266.9       Leota Sauers, NP-C  06/26/2014, 11:32 AM  [facility]    Patient Care Team:  Leota Sauers, NP as PCP - General (Nurse Practitioner)  None  Nurudeen Darin Engels, DO (Internal Medicine)  Samuel Bouche, MD as Physician (Gastroenterology)  [facility]  Krystal Clark, MD (Physical Medicine and Rehab)  Rhea Bleacher, NP (Nurse Practitioner)  Estil Daft, MD (Colon and Rectal Surgery)     Active Problem List:      Patient Active Problem List   Diagnosis Code   ??? Tobacco use disorder Z72.0   ??? Essential hypertension I10   ??? Hepatitis C B19.20   ??? Cerebral artery occlusion with cerebral infarction (HCC) I63.50   ??? Unspecified asthma(493.90)    ??? Seizure disorder (HCC) G40.909   ??? GERD (gastroesophageal reflux disease) K21.9   ??? 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 Chronic LBP M54.5, G89.29   ??? Failure to thrive in adult R62.7   ??? Spinal stenosis, lumbar M48.06   ??? DDD (degenerative disc disease), lumbosacral M51.37   ??? Lumbar neuritis M54.16   ??? 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 Chronic narcotic dependence (HCC) F11.20   ??? Arthritis, degenerative M19.90   ??? Narcotic dependency, continuous (HCC) F11.20       Medications:     Current Outpatient Prescriptions   Medication Sig   ??? linaclotide (LINZESS) 145 mcg cap capsule Take  by mouth Daily (before breakfast).   ??? levETIRAcetam (KEPPRA) 500 mg tablet Take  by mouth two (2) times a day.   ??? potassium chloride (KLOR-CON) 20 mEq packet Take 1 Packet by mouth daily.   ??? omeprazole (PRILOSEC) 20 mg capsule Take 1 Cap by mouth two (2) times a day.    ??? cyanocobalamin (VITAMIN B12) 2,500 mcg sublingual tablet Take 1 Tab by mouth daily.   ??? ergocalciferol (ERGOCALCIFEROL) 50,000 unit capsule Take 1 Cap by mouth every seven (7) days.   ??? Iron AspGly-C-B12-Ca-Suc-Stoma (MULTIGEN) tablet Take 1 Tab by mouth two (2) times a day.   ??? megestrol (MEGACE) 400 mg/10 mL (40 mg/mL) suspension Take 10 mL by mouth three (3) times daily (with meals). (Patient taking differently: Take 400 mg by mouth two (2) times a day.)   ??? nicotine (NICODERM CQ) 7 mg/24 hr 1 Patch by TransDERmal route every twenty-four (24) hours for 30 days. Indications: SMOKING CESSATION   ??? albuterol sulfate (PROVENTIL;VENTOLIN) 2.5 mg/0.5 mL nebu nebulizer solution 0.5 mL by Nebulization route every four (4) hours as needed for Wheezing.   ??? simvastatin (ZOCOR) 40 mg tablet Take 1 Tab by mouth nightly.   ??? gabapentin (NEURONTIN) 600 mg tablet Take 1 Tab by mouth three (3) times daily.   ??? lisinopril-hydrochlorothiazide (PRINZIDE, ZESTORETIC) 10-12.5 mg per tablet Take 1 Tab by mouth daily. Indications: HYPERTENSION   ??? albuterol (PROVENTIL HFA, VENTOLIN HFA, PROAIR HFA) 90 mcg/actuation inhaler Take 2 Puffs by inhalation every six (6) hours as needed for Wheezing.   ??? aspirin 81 mg chewable tablet 162 mg.   ??? HYDROcodone-acetaminophen (NORCO) 5-325 mg per tablet Take 1 Tab by mouth every eight (8) hours as needed for Pain. Max Daily Amount: 3 Tabs. (Patient taking differently: Take 1 Tab by mouth every four (4) hours as needed for Pain.)     No current facility-administered medications for this visit.       Additional History     Past Surgical History   Procedure Laterality Date   ??? Hx hysterectomy       History     Social History   ??? Marital Status: SINGLE     Spouse Name: N/A   ??? Number of Children: N/A   ??? Years of Education: N/A  Social History Main Topics   ??? Smoking status: Light Tobacco Smoker     Types: Cigarettes   ??? Smokeless tobacco: Never Used   ??? Alcohol Use: No       Comment: drinks a beer every once in a while   ??? Drug Use: No   ??? Sexual Activity: Not on file     Other Topics Concern   ??? None     Social History Narrative     Family History   Problem Relation Age of Onset   ??? Family history unknown: Yes     Allergies   Allergen Reactions   ??? Penicillins Nausea and Vomiting

## 2014-06-27 LAB — 7-DRUG SCREEN + ETHANOL, UR
Amphetamines, urine: NEGATIVE ng/mL
Barbiturates: NEGATIVE ng/mL
Benzodiazepines: NEGATIVE ng/mL
Cannabinoids: NEGATIVE ng/mL
Cocaine: NEGATIVE ng/mL
Ethanol, urine QT: NEGATIVE %
Opiates: NEGATIVE ng/mL
Phencyclidine: NEGATIVE ng/mL

## 2014-06-29 MED ORDER — HYDROCODONE-ACETAMINOPHEN 5 MG-325 MG TAB
5-325 mg | ORAL_TABLET | Freq: Three times a day (TID) | ORAL | Status: DC | PRN
Start: 2014-06-29 — End: 2014-07-29

## 2014-06-29 NOTE — Telephone Encounter (Signed)
Referral faxed to [phone] to the attn of Reece Packer at [facility].

## 2014-06-29 NOTE — Telephone Encounter (Signed)
Wanda Doyle states currently at speech therapy with pt @ [place] [facility]. Stated NP patricia Delton See would fax over an order for physical therapy to be done on the same day as speech therapy mon and wed. Stated total time would be from 12pm-2pm with physical therapy included.  Also gave fax# to fax over order. [phone] Attn: Reece Packer.

## 2014-06-29 NOTE — Telephone Encounter (Signed)
UDS clean, will restart pt on chronic pain medications.  Control agreement on file.

## 2014-06-29 NOTE — Telephone Encounter (Signed)
Lemmie Evens notified.

## 2014-06-29 NOTE — Telephone Encounter (Signed)
Please send them the referral done at last office visit.

## 2014-06-30 MED ORDER — KLOR-CON M20 MEQ TABLET,EXTENDED RELEASE
20 mEq | ORAL_TABLET | ORAL | Status: AC
Start: 2014-06-30 — End: ?

## 2014-07-03 ENCOUNTER — Encounter: Attending: Internal Medicine | Primary: Student in an Organized Health Care Education/Training Program

## 2014-07-20 NOTE — Progress Notes (Signed)
NNTOCIP    Transitions of Care Follow Up      The patient was hospitalized at [facility] 6/7-06/18/14 for abdominal pain and discharged to home.  Call to patient's listed number.  The patient continues to live in the [facility] with her friend/caregiver Wanda Doyle.   Reached patient and Wanda Doyle ([place] verified) and introduced self/role.  Per Wanda Doyle patient is "about the same."  No new problems or concerns noted. Patient has been unable to find alternate living accommodations due to limited income. Discussed with Wanda Doyle going to the [employer] office and Wanda Doyle agreed that he would try although transportation is difficult.  The patient has a life coach, Wanda Doyle, with whom she is in regular contact.      At this time the patient has completed a 30 day post hospital period with no readmission and is knowledgeable regarding medications and adherent to plan.  Goals met.  Episode resolved.

## 2014-07-29 ENCOUNTER — Encounter

## 2014-07-29 MED ORDER — HYDROCODONE-ACETAMINOPHEN 5 MG-325 MG TAB
5-325 mg | ORAL_TABLET | Freq: Three times a day (TID) | ORAL | Status: DC | PRN
Start: 2014-07-29 — End: 2014-08-25

## 2014-07-29 NOTE — Telephone Encounter (Signed)
Requested Prescriptions     Pending Prescriptions Disp Refills   ??? HYDROcodone-acetaminophen (NORCO) 5-325 mg per tablet 90 Tab 0     Sig: Take 1 Tab by mouth every eight (8) hours as needed for Pain. Max Daily Amount: 3 Tabs.

## 2014-07-29 NOTE — Telephone Encounter (Signed)
ERROR

## 2014-07-29 NOTE — Telephone Encounter (Signed)
Pt following up on refill request.

## 2014-07-29 NOTE — Telephone Encounter (Signed)
Attempt made to notify pt of rx.

## 2014-08-17 ENCOUNTER — Encounter

## 2014-08-17 MED ORDER — MEGESTROL 40 MG/ML ORAL SUSP
400 mg/10 mL (40 mg/mL) | Freq: Three times a day (TID) | ORAL | Status: AC
Start: 2014-08-17 — End: ?

## 2014-08-17 MED ORDER — GABAPENTIN 600 MG TAB
600 mg | ORAL_TABLET | Freq: Three times a day (TID) | ORAL | Status: AC
Start: 2014-08-17 — End: ?

## 2014-08-17 NOTE — Telephone Encounter (Signed)
Requested Prescriptions     Pending Prescriptions Disp Refills   ??? gabapentin (NEURONTIN) 600 mg tablet 90 Tab 1     Sig: Take 1 Tab by mouth three (3) times daily.   ??? megestrol (MEGACE) 400 mg/10 mL (40 mg/mL) suspension 480 mL 5     Sig: Take 10 mL by mouth three (3) times daily (with meals).

## 2014-08-25 ENCOUNTER — Encounter

## 2014-08-25 NOTE — Telephone Encounter (Signed)
Requested Prescriptions     Pending Prescriptions Disp Refills   ??? HYDROcodone-acetaminophen (NORCO) 5-325 mg per tablet 90 Tab 0     Sig: Take 1 Tab by mouth every eight (8) hours as needed for Pain. Max Daily Amount: 3 Tabs.     Wanda Doyle states medication not due until the aug 22. Aware of 72 hour processing policy.

## 2014-08-26 MED ORDER — HYDROCODONE-ACETAMINOPHEN 5 MG-325 MG TAB
5-325 mg | ORAL_TABLET | Freq: Three times a day (TID) | ORAL | 0 refills | Status: DC | PRN
Start: 2014-08-26 — End: 2014-09-16

## 2014-08-26 NOTE — Telephone Encounter (Signed)
Left voicemail stating rx was available for pickup

## 2014-09-16 ENCOUNTER — Emergency Department
Admit: 2014-09-16 | Payer: PRIVATE HEALTH INSURANCE | Primary: Student in an Organized Health Care Education/Training Program

## 2014-09-16 ENCOUNTER — Inpatient Hospital Stay
Admit: 2014-09-16 | Discharge: 2014-09-17 | Disposition: A | Discharge status: Home / Self Care | Payer: PRIVATE HEALTH INSURANCE | Attending: Emergency Medicine

## 2014-09-16 DIAGNOSIS — S2231XA Fracture of one rib, right side, initial encounter for closed fracture: Secondary | ICD-10-CM

## 2014-09-16 MED ORDER — HYDROCODONE-ACETAMINOPHEN 5 MG-325 MG TAB
5-325 mg | ORAL | Status: AC
Start: 2014-09-16 — End: 2014-09-16
  Administered 2014-09-16: 10:00:00 via ORAL

## 2014-09-16 MED ORDER — HYDROCODONE-ACETAMINOPHEN 5 MG-325 MG TAB
5-325 mg | ORAL_TABLET | ORAL | 0 refills | Status: DC | PRN
Start: 2014-09-16 — End: 2014-09-21

## 2014-09-16 MED FILL — HYDROCODONE-ACETAMINOPHEN 5 MG-325 MG TAB: 5-325 mg | ORAL | Qty: 1

## 2014-09-16 NOTE — ED Notes (Signed)
Spoke to boyfriend and updated on pt status. He states he will be home awaiting transport. Bedside shift change report given to Arline Asp, Charity fundraiser (Cabin crew) by Tommy Rainwater, RN (offgoing nurse). Report included the following information SBAR, ED Summary, MAR and Recent Results.

## 2014-09-16 NOTE — ED Notes (Signed)
Pt medicated for pain.

## 2014-09-16 NOTE — ED Notes (Signed)
I have reviewed discharge instructions with the patient.  The patient verbalized understanding.Patient armband removed and given to patient to take home.  Patient was informed of the privacy risks if armband lost or stolen. Pt aox3. Vitals stable. No distress noted.

## 2014-09-16 NOTE — ED Provider Notes (Addendum)
HPI Comments: 59yo female presents to ER complaining of right shoulder, right side pain, back pain, and right leg pain after slip yesterday about 10am while in the shower.  States she had to call her roommate to help her out of the tub but she was able to ambulate with her walker afterwards.  Denies any head injury.  History of remote CVA affecting right side.  Ambulates with walker because of CVA history.   Patient admits she was able to stand and ambulate after injury but barely because of discomfort.          Past Medical History:   Diagnosis Date   ??? Hypertension    ??? Seizures (HCC)    ??? Stroke [facility])        Past Surgical History:   Procedure Laterality Date   ??? Hx hysterectomy           Family History:   Problem Relation Age of Onset   ??? Family history unknown: Yes       Social History     Social History   ??? Marital status: SINGLE     Spouse name: N/A   ??? Number of children: N/A   ??? Years of education: N/A     Occupational History   ??? Not on file.     Social History Main Topics   ??? Smoking status: Light Tobacco Smoker     Types: Cigarettes   ??? Smokeless tobacco: Never Used   ??? Alcohol use No      Comment: drinks a beer every once in a while   ??? Drug use: No   ??? Sexual activity: Not on file     Other Topics Concern   ??? Not on file     Social History Narrative         ALLERGIES: Penicillins    Review of Systems   Constitutional: Positive for activity change and fatigue. Negative for appetite change.   Respiratory: Negative for cough and shortness of breath.    Cardiovascular: Negative for chest pain.   Gastrointestinal: Negative for abdominal pain and vomiting.   Genitourinary: Negative for difficulty urinating and dysuria.   Musculoskeletal: Positive for back pain and neck pain. Negative for joint swelling.   Skin: Negative for wound.   Neurological: Positive for weakness. Negative for numbness and headaches.        Chronic right upper and lower extremity weakness secondary to CVA.      All other systems reviewed and are negative.      Vitals:    09/16/14 0337   BP: 143/89   Pulse: 77   Resp: 18   Temp: 98.3 ??F (36.8 ??C)   SpO2: 95%            Physical Exam   Constitutional: She is oriented to person, place, and time. She appears well-developed. No distress.   Appears older than stated age.   Slightly thin   HENT:   Head: Atraumatic.   Mouth/Throat: Oropharynx is clear and moist.   Eyes: Conjunctivae and EOM are normal. Pupils are equal, round, and reactive to light.   Neck: Normal range of motion. Neck supple.   No midline cervical TTP.  Mild right cervical musculature and right trapezius TTP.  Normal ROM without evidence of discomfort.     Cardiovascular: Normal rate, regular rhythm and normal heart sounds.    Pulmonary/Chest: Effort normal and breath sounds normal. She exhibits tenderness.   Right anterolateral ribs TTP.  No swelling, crepitus, redness or bruising.    Abdominal: Soft. There is no tenderness. There is no guarding.   Musculoskeletal:   Right trapezius, right thoracic musculature TTP.  Mid thoracic midline TTP down to mid lumbar area.  No redness, swelling, no bruising.     Normal ROM of bilateral shoulders.  No TTP of upper right arm, elbow, forearm, wrist, hand or fingers.     No TTP of pelvis, hips, thighs, knees, lower legs, ankles, feet.    Neurological: She is alert and oriented to person, place, and time. No cranial nerve deficit.   Skin: Skin is warm and dry. She is not diaphoretic.   Psychiatric:   Depressed affect   Nursing note and vitals reviewed.       MDM  Number of Diagnoses or Management Options  Diagnosis management comments: 59yo female with slip and fall in shower yesterday complaining of right upper back, right side pain and lower back discomfort.  Able to ambulate after fall.   History of lower back discomfort.   Muscular right trapezius TTP, thoracic and lumbar spine TTP, right posterolateral ribs TTP.  No evidence of fracture of thoracic or  lumbar fracture.  Posterior rib #7 appears fractured, no PTX.  Rx for Norco.  PCP follow up.     ED Course       Procedures     5:54 AM  ACCORDING TO PATIENT NURSE, PATIENT AMBULATED TO BATHROOM ON HER WITHOUT ASSISTANCE.

## 2014-09-16 NOTE — ED Notes (Signed)
Transport by via [employer]

## 2014-09-16 NOTE — ED Notes (Signed)
Per ems pt fell in shower yesterday. Pt c/o back pain, right leg, right arm, and right rib. Pt moving all extremities. Pt denies hitting head. Pt denies LOC. Vitals stable.

## 2014-09-21 ENCOUNTER — Encounter

## 2014-09-21 MED ORDER — HYDROCODONE-ACETAMINOPHEN 5 MG-325 MG TAB
5-325 mg | ORAL_TABLET | Freq: Three times a day (TID) | ORAL | 0 refills | Status: AC | PRN
Start: 2014-09-21 — End: ?

## 2014-09-21 NOTE — Telephone Encounter (Signed)
Last ED appt 09/16/2014 for fall, last office visit 06/26/2014. Last filled on 09/16/2014 from [facility], last filled from [facility]ena [facility] on 08/23/2014 for final review.

## 2014-09-21 NOTE — Telephone Encounter (Signed)
I have reviewed this patient's report generated by the [place] Prescription Monitoring Program which does not demonstrate aberrancies and inconsistencies with regard to the historical prescribing of controlled medications to this patient by other providers.

## 2014-09-21 NOTE — Telephone Encounter (Signed)
Casimiro Needle law following up on rx request. States has to set up transportation a head of time. States will call tomorrow afternoon to f/u.

## 2014-09-21 NOTE — Telephone Encounter (Signed)
Requested Prescriptions     Pending Prescriptions Disp Refills   ??? HYDROcodone-acetaminophen (NORCO) 5-325 mg per tablet 20 Tab 0     Sig: Take 1 Tab by mouth every four (4) hours as needed for Pain. Max Daily Amount: 6 Tabs.     FU already sched for 10/05/14.

## 2014-09-22 NOTE — Telephone Encounter (Signed)
Informed pt that her medication script is ready for pick up here at the office. Pt verbalized understanding of conversation.

## 2014-09-24 ENCOUNTER — Encounter

## 2014-09-28 NOTE — Telephone Encounter (Signed)
Pt's sister Alona Bene states pt has moved to [place], [place] with her due to the death of her caretaker/freind Lemmie Evens. Alona Bene inquiring where and who would her medical records be sent to . Merri Brunette once she finds a pcp she has to sign a release form to have records sent to new pcp. Alona Bene stated ok. Alona Bene also states pt complaining of back pain and states currently has cracked ribs due to a fall. Merri Brunette to take pt to ER. Stated ok.  States if anyone has any questions or concerns regarding pt please call her.

## 2014-10-05 ENCOUNTER — Encounter: Attending: Family | Primary: Student in an Organized Health Care Education/Training Program

## 2014-10-14 ENCOUNTER — Encounter: Payer: Self-pay | Admitting: Internal Medicine

## 2014-10-14 ENCOUNTER — Ambulatory Visit (INDEPENDENT_AMBULATORY_CARE_PROVIDER_SITE_OTHER): Payer: Medicare Other | Admitting: Internal Medicine

## 2014-10-14 VITALS — BP 153/83 | HR 94 | Temp 97.6°F | Wt 118.1 lb

## 2014-10-14 DIAGNOSIS — E538 Deficiency of other specified B group vitamins: Secondary | ICD-10-CM

## 2014-10-14 DIAGNOSIS — I69351 Hemiplegia and hemiparesis following cerebral infarction affecting right dominant side: Secondary | ICD-10-CM

## 2014-10-14 DIAGNOSIS — F17210 Nicotine dependence, cigarettes, uncomplicated: Secondary | ICD-10-CM | POA: Diagnosis not present

## 2014-10-14 DIAGNOSIS — I639 Cerebral infarction, unspecified: Secondary | ICD-10-CM

## 2014-10-14 DIAGNOSIS — Z0000 Encounter for general adult medical examination without abnormal findings: Secondary | ICD-10-CM

## 2014-10-14 DIAGNOSIS — I10 Essential (primary) hypertension: Secondary | ICD-10-CM

## 2014-10-14 DIAGNOSIS — F1991 Other psychoactive substance use, unspecified, in remission: Secondary | ICD-10-CM

## 2014-10-14 DIAGNOSIS — F1020 Alcohol dependence, uncomplicated: Secondary | ICD-10-CM | POA: Diagnosis not present

## 2014-10-14 DIAGNOSIS — I69328 Other speech and language deficits following cerebral infarction: Secondary | ICD-10-CM

## 2014-10-14 DIAGNOSIS — I69321 Dysphasia following cerebral infarction: Secondary | ICD-10-CM | POA: Diagnosis not present

## 2014-10-14 DIAGNOSIS — J45909 Unspecified asthma, uncomplicated: Secondary | ICD-10-CM | POA: Insufficient documentation

## 2014-10-14 DIAGNOSIS — E785 Hyperlipidemia, unspecified: Secondary | ICD-10-CM

## 2014-10-14 DIAGNOSIS — K219 Gastro-esophageal reflux disease without esophagitis: Secondary | ICD-10-CM

## 2014-10-14 DIAGNOSIS — E559 Vitamin D deficiency, unspecified: Secondary | ICD-10-CM

## 2014-10-14 DIAGNOSIS — G40909 Epilepsy, unspecified, not intractable, without status epilepticus: Secondary | ICD-10-CM

## 2014-10-14 DIAGNOSIS — I6930 Unspecified sequelae of cerebral infarction: Secondary | ICD-10-CM | POA: Insufficient documentation

## 2014-10-14 DIAGNOSIS — Z87898 Personal history of other specified conditions: Secondary | ICD-10-CM

## 2014-10-14 DIAGNOSIS — R569 Unspecified convulsions: Secondary | ICD-10-CM

## 2014-10-14 MED ORDER — LEVETIRACETAM 500 MG PO TABS: 500.0000 mg | ORAL_TABLET | Freq: Two times a day (BID) | ORAL | Status: DC

## 2014-10-14 MED ORDER — OMEPRAZOLE 20 MG PO CPDR: 20.0000 mg | DELAYED_RELEASE_CAPSULE | Freq: Every day | ORAL | Status: DC

## 2014-10-14 MED ORDER — ALBUTEROL SULFATE (5 MG/ML) 0.5% IN NEBU: 2.5000 mg | INHALATION_SOLUTION | Freq: Four times a day (QID) | RESPIRATORY_TRACT | Status: DC | PRN

## 2014-10-14 MED ORDER — MEGESTROL ACETATE 400 MG/10ML PO SUSP: 200.0000 mg | Freq: Three times a day (TID) | ORAL | Status: DC

## 2014-10-14 MED ORDER — POTASSIUM CHLORIDE ER 10 MEQ PO TBCR: 20.0000 meq | EXTENDED_RELEASE_TABLET | Freq: Every day | ORAL | Status: DC

## 2014-10-14 MED ORDER — ERGOCALCIFEROL 1.25 MG (50000 UT) PO CAPS: 50000.0000 [IU] | ORAL_CAPSULE | ORAL | Status: DC

## 2014-10-14 MED ORDER — LINACLOTIDE 145 MCG PO CAPS: 145.0000 ug | ORAL_CAPSULE | Freq: Every day | ORAL | Status: DC

## 2014-10-14 MED ORDER — LISINOPRIL-HYDROCHLOROTHIAZIDE 10-12.5 MG PO TABS: 1.0000 | ORAL_TABLET | Freq: Every day | ORAL | Status: DC

## 2014-10-14 MED ORDER — B-12 2500 MCG SL SUBL: 2500.0000 ug | SUBLINGUAL_TABLET | Freq: Every day | SUBLINGUAL | Status: DC

## 2014-10-14 NOTE — Assessment & Plan Note (Addendum)
Patient still has right sided deficits as well as speech deficits.  Unknown cause of her stroke.  Working to obtain records from PCP in [place].  Patient is not a known diabetic, no known history of atrial fibrillation.  She is not currently on aspirin or statin therapy.  Risk factors include smoking, alcohol use, and HTN.  -obtain lipid panel -CMP to check glucose, renal function -obtain records from PCP -consider aspirin 81mg  daily -referral for PT, OT, and Speech therapy

## 2014-10-14 NOTE — Assessment & Plan Note (Signed)
BP today 153/83 while on Lisinopril-HCTZ 10/12.5mg  daily.  Will send refill to new pharmacy.  Address BP at follow up visit.  May need to increase current dose or add another agent to maintain goal BP of at least 140/90.

## 2014-10-14 NOTE — Assessment & Plan Note (Signed)
Currently on Keppra 500mg  BID, will refill rx.  Patient states she cannot recall last time she had seizure but believes it to have been at least a couple years.

## 2014-10-14 NOTE — Patient Instructions (Signed)
Stroke Prevention °Some health problems and behaviors may make it more likely for you to have a stroke. Below are ways to lessen your risk of having a stroke.  °· Be active for at least 30 minutes on most or all days. °· Do not smoke. Try not to be around others who smoke. °· Do not drink too much alcohol. °¨ Do not have more than 2 drinks a day if you are a man. °¨ Do not have more than 1 drink a day if you are a woman and are not pregnant. °· Eat healthy foods, such as fruits and vegetables. If you were put on a specific diet, follow the diet as told. °· Keep your cholesterol levels under control through diet and medicines. Look for foods that are low in saturated fat, trans fat, cholesterol, and are high in fiber. °· If you have diabetes, follow all diet plans and take your medicine as told. °· Ask your doctor if you need treatment to lower your blood pressure. If you have high blood pressure (hypertension), follow all diet plans and take your medicine as told by your doctor. °· If you are 18-39 years old, have your blood pressure checked every 3-5 years. If you are age 40 or older, have your blood pressure checked every year. °· Keep a healthy weight. Eat foods that are low in calories, salt, saturated fat, trans fat, and cholesterol. °· Do not take drugs. °· Avoid birth control pills, if this applies. Talk to your doctor about the risks of taking birth control pills. °· Talk to your doctor if you have sleep problems (sleep apnea). °· Take all medicine as told by your doctor. °¨ You may be told to take aspirin or blood thinner medicine. Take this medicine as told by your doctor. °¨ Understand your medicine instructions. °· Make sure any other conditions you have are being taken care of. °GET HELP RIGHT AWAY IF: °· You suddenly lose feeling (you feel numb) or have weakness in your face, arm, or leg. °· Your face or eyelid hangs down to one side. °· You suddenly feel confused. °· You have trouble talking (aphasia)  or understanding what people are saying. °· You suddenly have trouble seeing in one or both eyes. °· You suddenly have trouble walking. °· You are dizzy. °· You lose your balance or your movements are clumsy (uncoordinated). °· You suddenly have a very bad headache and you do not know the cause. °· You have new chest pain. °· Your heart feels like it is fluttering or skipping a beat (irregular heartbeat). °Do not wait to see if the symptoms above go away. Get help right away. Call your local emergency services (911 in [place] Do not drive yourself to the hospital. °  °This information is not intended to replace advice given to you by your health care provider. Make sure you discuss any questions you have with your health care provider. °  °Document Released: 06/27/2011 Document Revised: 01/16/2014 Document Reviewed: 06/28/2012 °Elsevier Interactive Patient Education ©2016 [employer] Inc. ° °

## 2014-10-14 NOTE — Progress Notes (Signed)
Patient ID: Tina Sanders, female   DOB: 1955/07/14, 59 y.o.   MRN: [id number]   Subjective:   Patient ID: Tina Sanders female   DOB: [birth year] 59 y.o.   MRN: [id number]  HPI: [name] is a 59 y.o. 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  Patient recently moved here from [place], [place] where she had been living.  She had a caretaker there for about 20 years who recently passed away in a motor vehicle accident and is now living in [place] with her sister.  Social Hx: Divorced, living with sister in [place], recently moved from [place].  Drinks about 2 cans of beer per day and occasionally drinks gin.  Smokes 4-5 cigarettes per day for the past 40 years.  Medical Hx: HTN, seizure disorder, GERD, CVA, vitamin B12 and vitamin D deficiency.  We have requested records from patients prior PCP: Maple Hudson, NP of [facility] in [place], [place].  Family Hx: mother, father, and siblings with HTN, CKD, DM, CVA, HLD, sickle cell disease    Past Medical History  Diagnosis Date  . GERD (gastroesophageal reflux disease)   . Seizures ([place])   . Hypertension   . B12 deficiency   . Vitamin D deficiency disease   . Stroke [facility])     residual right sided deficits, expressive dysphasia   Current Outpatient Prescriptions  Medication Sig Dispense Refill  . albuterol (PROVENTIL) (5 MG/ML) 0.5% nebulizer solution Take 0.5 mLs (2.5 mg total) by nebulization every 6 (six) hours as needed for wheezing or shortness of breath. 20 mL 12  . Cyanocobalamin (B-12) 2500 MCG SUBL Place 2,500 mcg under the tongue daily. 30 tablet 2  . ergocalciferol (VITAMIN D2) 50000 UNITS capsule Take 1 capsule (50,000 Units total) by mouth once a week. 12 capsule 0  . levETIRAcetam (KEPPRA) 500 MG  tablet Take 1 tablet (500 mg total) by mouth 2 (two) times daily. 60 tablet 2  . Linaclotide (LINZESS) 145 MCG CAPS capsule Take 1 capsule (145 mcg total) by mouth daily. 30 capsule 2  . lisinopril-hydrochlorothiazide (ZESTORETIC) 10-12.5 MG tablet Take 1 tablet by mouth daily. 30 tablet 2  . megestrol (MEGACE) 400 MG/10ML suspension Take 5 mLs (200 mg total) by mouth 3 (three) times daily with meals. 240 mL 2  . omeprazole (PRILOSEC) 20 MG capsule Take 1 capsule (20 mg total) by mouth daily. 30 capsule 2  . potassium chloride (K-DUR) 10 MEQ tablet Take 2 tablets (20 mEq total) by mouth daily. 60 tablet 2   No current facility-administered medications for this visit.   Family History  Problem Relation Age of Onset  . Hypertension Mother   . Hypertension Father   . Kidney disease Father   . Hyperlipidemia Father   . Hypertension Sister   . Diabetes Sister    Social History   Social History  . Marital Status: Single    Spouse Name: N/A  . Number of Children: N/A  . Years of Education: N/A   Social History Main Topics  . Smoking status: Current Every Day Smoker -- 0.25 packs/day for 40 years    Types: Cigarettes  . Smokeless tobacco: None  . Alcohol Use: 8.4 oz/week    0 Standard drinks or equivalent, 14 Cans of beer per week  . Drug Use:  None  . Sexual Activity: Not Asked   Other Topics Concern  . None   Social History Narrative  . None   Review of Systems: Review of Systems  Constitutional: Negative for fever and chills.  HENT: Negative for congestion and sore throat.   Eyes: Negative for blurred vision and pain.  Respiratory: Negative for cough and shortness of breath.   Cardiovascular: Negative for chest pain and leg swelling.  Gastrointestinal: Negative for heartburn, nausea, vomiting, abdominal pain, diarrhea and constipation.  Genitourinary: Negative for dysuria and frequency.  Musculoskeletal: Positive for back pain and falls.  Skin: Negative for rash.    Neurological: Negative for dizziness and headaches.  Psychiatric/Behavioral: Negative for depression.    Objective:  Physical Exam: Filed Vitals:   10/14/14 1336  BP: 153/83  Pulse: 94  Temp: 97.6 F (36.4 C)  TempSrc: Oral  Weight: 118 lb 1.6 oz (53.57 kg)  SpO2: 100%   Physical Exam  Constitutional: She is oriented to person, place, and time. She appears well-developed and well-nourished.  HENT:  Head: Normocephalic and atraumatic.  Eyes: Conjunctivae and EOM are normal.  Neck: Normal range of motion. No JVD present.  Cardiovascular: Normal rate, regular rhythm, normal heart sounds and intact distal pulses.   Pulmonary/Chest: Effort normal and breath sounds normal.  Abdominal: Soft. Bowel sounds are normal.  Musculoskeletal: She exhibits tenderness (tender to palpation over ribs on right side). She exhibits no edema.  Lymphadenopathy:    She has no cervical adenopathy.  Neurological: She is alert and oriented to person, place, and time.  Patient does have residual right sided deficit secondary to stroke 3 years ago.   Right upper extremity strength 4/5, Left upper extremity strength 5/5. Right lower extremity strength 4/5, Left lower extremity strength 5/5.  Sensation intact bilaterally. Patient has good comprehension but does have some difficulty finding her words.  Skin: Skin is warm and dry.  Psychiatric: She has a normal mood and affect.    Assessment & Plan:  Please see Problem List for Assessment and Plan

## 2014-10-14 NOTE — Assessment & Plan Note (Signed)
Currently on omeprazole 20mg  daily.   No complaints of symptoms today.  Will refill rx and continue to monitor.

## 2014-10-15 DIAGNOSIS — E785 Hyperlipidemia, unspecified: Secondary | ICD-10-CM | POA: Insufficient documentation

## 2014-10-15 LAB — CMP14 + ANION GAP
ALBUMIN: 4.6 g/dL (ref 3.6–4.8)
ALK PHOS: 73 IU/L (ref 39–117)
ALT: 10 IU/L (ref 0–32)
ANION GAP: 21 mmol/L — AB (ref 10.0–18.0)
AST: 21 IU/L (ref 0–40)
Albumin/Globulin Ratio: 1.4 (ref 1.1–2.5)
BUN / CREAT RATIO: 18 (ref 11–26)
BUN: 21 mg/dL (ref 8–27)
Bilirubin Total: 1.1 mg/dL (ref 0.0–1.2)
CO2: 21 mmol/L (ref 18–29)
CREATININE: 1.16 mg/dL — AB (ref 0.57–1.00)
Calcium: 10.4 mg/dL — ABNORMAL HIGH (ref 8.7–10.3)
Chloride: 100 mmol/L (ref 97–108)
GFR calc Af Amer: 59 mL/min/{1.73_m2} — ABNORMAL LOW (ref >59)
GFR calc non Af Amer: 51 mL/min/{1.73_m2} — ABNORMAL LOW (ref >59)
Globulin, Total: 3.3 g/dL (ref 1.5–4.5)
Glucose: 74 mg/dL (ref 65–99)
Potassium: 4.6 mmol/L (ref 3.5–5.2)
Sodium: 142 mmol/L (ref 134–144)
TOTAL PROTEIN: 7.9 g/dL (ref 6.0–8.5)

## 2014-10-15 LAB — LIPID PANEL
CHOLESTEROL TOTAL: 213 mg/dL — AB (ref 100–199)
Chol/HDL Ratio: 3 ratio units (ref 0.0–4.4)
HDL: 70 mg/dL (ref >39)
LDL CALC: 132 mg/dL — AB (ref 0–99)
Triglycerides: 57 mg/dL (ref 0–149)
VLDL CHOLESTEROL CAL: 11 mg/dL (ref 5–40)

## 2014-10-15 MED ORDER — ATORVASTATIN CALCIUM 20 MG PO TABS: 20.0000 mg | ORAL_TABLET | Freq: Every day | ORAL | Status: DC

## 2014-10-15 NOTE — Addendum Note (Signed)
Addended by: Mignon Pine on: 10/15/2014 02:03 PM   Modules accepted: Orders

## 2014-10-15 NOTE — Progress Notes (Signed)
Talked with pt and sister ( per pt ).

## 2014-10-15 NOTE — Assessment & Plan Note (Signed)
Not on any statin therapy currently.  Lipid panel ordered at initial office visit w/ following results: TC 213, HDL 70, LDL 132, TG 57. 10-year ASCVD risk is 20.7% with recommendation for patient to be on a moderate to high intensity statin.  Plan: -start atorvastatin 20mg  daily

## 2014-10-16 LAB — PRESCRIPTION ABUSE MONITORING 17P, URINE
6-Acetylmorphine, Urine: NEGATIVE ng/mL
Amphetamine Scrn, Ur: NEGATIVE ng/mL
BARBITURATE SCREEN URINE: NEGATIVE ng/mL
BENZODIAZEPINE SCREEN, URINE: NEGATIVE ng/mL
Buprenorphine, Urine: NEGATIVE ng/mL
CANNABINOIDS UR QL SCN: NEGATIVE ng/mL
CARISOPRODOL/MEPROBAMATE, UR: NEGATIVE ng/mL
CREATININE(CRT), U: 129.4 mg/dL (ref 20.0–300.0)
Cocaine (Metab) Scrn, Ur: NEGATIVE ng/mL
EDDP, URINE: NEGATIVE ng/mL
Fentanyl, Urine: NEGATIVE pg/mL
MDMA Screen, Urine: NEGATIVE ng/mL
MEPERIDINE SCREEN, URINE: NEGATIVE ng/mL
Methadone Screen, Urine: NEGATIVE ng/mL
NITRITE URINE, QUANTITATIVE: NEGATIVE ug/mL
OXYCODONE+OXYMORPHONE UR QL SCN: NEGATIVE ng/mL
Opiate Scrn, Ur: NEGATIVE ng/mL
Ph of Urine: 5.3 (ref 4.5–8.9)
Phencyclidine Qn, Ur: NEGATIVE ng/mL
Propoxyphene Scrn, Ur: NEGATIVE ng/mL
SPECIFIC GRAVITY: 1.009
TRAMADOL SCREEN, URINE: NEGATIVE ng/mL
Tapentadol, Urine: NEGATIVE ng/mL

## 2014-10-19 ENCOUNTER — Encounter: Payer: Self-pay | Admitting: Rehabilitation

## 2014-10-19 ENCOUNTER — Other Ambulatory Visit: Payer: Self-pay | Admitting: Internal Medicine

## 2014-10-19 ENCOUNTER — Ambulatory Visit: Payer: Medicare Other | Attending: Internal Medicine | Admitting: Rehabilitation

## 2014-10-19 ENCOUNTER — Ambulatory Visit: Payer: Medicare Other | Admitting: Occupational Therapy

## 2014-10-19 ENCOUNTER — Encounter: Payer: Self-pay | Admitting: Occupational Therapy

## 2014-10-19 VITALS — BP 138/85 | HR 92

## 2014-10-19 DIAGNOSIS — IMO0002 Reserved for concepts with insufficient information to code with codable children: Secondary | ICD-10-CM

## 2014-10-19 DIAGNOSIS — G8191 Hemiplegia, unspecified affecting right dominant side: Secondary | ICD-10-CM | POA: Diagnosis present

## 2014-10-19 DIAGNOSIS — M79641 Pain in right hand: Secondary | ICD-10-CM

## 2014-10-19 DIAGNOSIS — R201 Hypoesthesia of skin: Secondary | ICD-10-CM

## 2014-10-19 DIAGNOSIS — R6889 Other general symptoms and signs: Secondary | ICD-10-CM | POA: Diagnosis present

## 2014-10-19 DIAGNOSIS — R269 Unspecified abnormalities of gait and mobility: Secondary | ICD-10-CM

## 2014-10-19 DIAGNOSIS — M6281 Muscle weakness (generalized): Secondary | ICD-10-CM | POA: Insufficient documentation

## 2014-10-19 DIAGNOSIS — I69351 Hemiplegia and hemiparesis following cerebral infarction affecting right dominant side: Secondary | ICD-10-CM | POA: Diagnosis present

## 2014-10-19 DIAGNOSIS — M25511 Pain in right shoulder: Secondary | ICD-10-CM | POA: Diagnosis present

## 2014-10-19 DIAGNOSIS — R2681 Unsteadiness on feet: Secondary | ICD-10-CM

## 2014-10-19 DIAGNOSIS — R279 Unspecified lack of coordination: Secondary | ICD-10-CM | POA: Diagnosis present

## 2014-10-19 DIAGNOSIS — I6980 Unspecified sequelae of other cerebrovascular disease: Secondary | ICD-10-CM | POA: Insufficient documentation

## 2014-10-19 NOTE — Therapy (Signed)
[facility] [address] [place], [place], [address] Phone: [phone]   Fax:  [phone]  Physical Therapy Evaluation  Patient Details  Name: Tina Sanders MRN: [id number] Date of Birth: Aug 14, 1955 Referring Provider:  Jule Ser, DO  Encounter Date: 10/19/2014      PT End of Session - 10/19/14 1259    Visit Number 1   Number of Visits 17   Date for PT Re-Evaluation 12/18/14   Authorization Type MCR primary, MCD secondary, G codes every 10th visit!   PT Start Time 0820   PT Stop Time 0849   PT Time Calculation (min) 29 min   Activity Tolerance Patient limited by pain   Behavior During Therapy [facility] for tasks assessed/performed      Past Medical History  Diagnosis Date  . GERD (gastroesophageal reflux disease)   . Seizures ([place])   . Hypertension   . B12 deficiency   . Vitamin D deficiency disease   . Stroke [facility])     residual right sided deficits, expressive dysphasia    History reviewed. No pertinent past surgical history.  There were no vitals filed for this visit.  Visit Diagnosis:  Hemiparesis affecting right side as late effect of stroke (HCC)  Abnormality of gait  Unsteadiness  Decreased functional activity tolerance      Subjective Assessment - 10/19/14 0823    Subjective "My whole right side all the way down is weak."  "Sometimes my other side feels weak, too."  Pt and sister reports being hospitalized in [place], [place] approx 3 months ago for fall in shower and had fractured ribs.  Also reports (from sister) that she does not have any of her medications due to Medicaid still be active in [place].  Sister working today to get medications.     Patient is accompained by: Family member  sister   Limitations Walking;House hold activities;Standing   Currently in Pain? Yes   Pain Score 8    Pain Location Back  and right side of ribs   Pain Orientation Mid   Pain Descriptors / Indicators  Aching   Pain Type Chronic pain   Pain Onset More than a month ago   Pain Frequency Constant   Aggravating Factors  moving a lot   Pain Relieving Factors pain medication            [facility] PT Assessment - 10/19/14 0001    Assessment   Medical Diagnosis L CVA   Onset Date/Surgical Date 10/19/11  CVA 3 years ago, fall 3 months ago   Precautions   Precautions Fall   Restrictions   Weight Bearing Restrictions No   Balance Screen   Has the patient fallen in the past 6 months Yes   How many times? 7   Has the patient had a decrease in activity level because of a fear of falling?  Yes   Is the patient reluctant to leave their home because of a fear of falling?  Yes   Home Environment   Living Environment Private residence   Living Arrangements Other relatives   Available Help at Discharge Family;Available PRN/intermittently  Sister works Thursday and Friday ([phone])   Type of Home Apartment   Home Access Stairs to enter   Entrance Stairs-Number of Steps 15   Entrance Stairs-Rails Right   Home Layout One level   [place] - quad;Grab bars - tub/shower  states cane is too high   Prior Function   Level  of Independence Needs assistance with ADLs;Needs assistance with homemaking  sister helps her into shower, does cooking   Cognition   Overall Cognitive Status History of cognitive impairments - at baseline  brother was assisting with  medication managment   Sensation   Light Touch Impaired Detail   Light Touch Impaired Details Impaired RUE;Impaired RLE   Proprioception Impaired Detail   Proprioception Impaired Details Impaired RLE   Coordination   Gross Motor Movements are Fluid and Coordinated No   Fine Motor Movements are Fluid and Coordinated No   Coordination and Movement Description decreaed strength and coordination in RUE/LE   Tone   Assessment Location Right Lower Extremity   ROM / Strength   AROM / PROM / Strength Strength   AROM   Overall AROM   Deficits;Due to pain   Strength   Overall Strength Deficits   Overall Strength Comments Difficult to assess due to pain, however pt with 2+/5 hip flex, knee ext 4/5, knee flex 2/5, ankle DF 2/5, PF 2/5, all with pain and pt report of increased tone in toe flexors.    Transfers   Transfers Sit to Stand;Stand to Sit   Sit to Stand 5: Supervision   Sit to Stand Details Verbal cues for precautions/safety   Five time sit to stand comments  56.07 seconds and requires use of UEs on mat, increased pain in back/ribs   Stand to Sit 5: Supervision   Stand to Sit Details (indicate cue type and reason) Verbal cues for precautions/safety   Ambulation/Gait   Ambulation/Gait Yes   Ambulation/Gait Assistance 5: Supervision;4: Min guard   Ambulation/Gait Assistance Details Performed 120' without AD and note marked fatigue.  Also note wide BOS to maintain balance and intermittent slight LOB to the R.     Ambulation Distance (Feet) 120 Feet   Assistive device None   Gait Pattern Decreased arm swing - right;Step-through pattern;Decreased stride length;Decreased hip/knee flexion - right;Decreased weight shift to right;Trunk flexed;Poor foot clearance - right   Ambulation Surface Level;Indoor   Gait velocity 1.75 ft/sec   RUE Tone   RUE Tone --   RLE Tone   RLE Tone Mild;Hypertonic                           PT Education - 10/19/14 1258    Education provided Yes   Education Details Education to pt and sister regarding evaluation findings, POC, brining in quad cane next visit to adjust for her height   Person(s) Educated Patient;Other (comment)  sister   Methods Explanation;Demonstration;Verbal cues   Comprehension Verbalized understanding;Returned demonstration          PT Short Term Goals - 10/19/14 1600    PT SHORT TERM GOAL #1   Title Pt will perform BERG balance test to better assess balance and fall risk.  (Target Date: 11/16/14)   Time 4   Period Weeks   PT SHORT TERM GOAL  #2   Title Pt will initiate HEP for balance and strengthening in order to demonstrate improved functional strength and balance at home.  (Target Date: 11/16/14)   Time 4   Period Weeks   PT SHORT TERM GOAL #3   Title Pt will report no more than 2 point increase in pain during functional mobiltiy to indicate pain not limiting factor with mobility. (Target Date: 11/16/14)   Time 4   Period Weeks   PT SHORT TERM GOAL #4   Title Pt  will ambulate 19' with LRAD at S level while negotiating obstacles in order to demonstrate safe house hold ambulation. (Target Date: 11/16/14)   PT SHORT TERM GOAL #5   Title Will perform 6MWT in order to assess functional endurance. (Target Date: 11/16/14)   Additional Short Term Goals   Additional Short Term Goals Yes   PT SHORT TERM GOAL #6   Title Pt will increase gait speed to 2.35 ft/sec to indicate increased efficiency of gait and decreased fall risk.  (Target Date: 11/16/14)   Time 4   Period Weeks           PT Long Term Goals - 10/19/14 1607    PT LONG TERM GOAL #1   Title Pt/family will be independent with HEP to indicate improvement in functional strength and balance. (Target Date: 12/14/14)   Time 8   Period Weeks   PT LONG TERM GOAL #2   Title Pt will increase 6MWT by 164' in order to indicate functional improvement in endurance. (Target Date: 12/14/14)   Time 8   Period Weeks   PT LONG TERM GOAL #3   Title Pt will increase BERG balance score by 8 points in order to indicate functional improvement in balance and decreased fall risk. (Target Date: 12/14/14)   Time 8   Period Weeks   PT LONG TERM GOAL #4   Title Pt will ambulate 400' w/ LRAD on outdoor paved uneven surfaces at S level in order to indicate safe community negotiation.  (Target Date: 12/14/14)   PT LONG TERM GOAL #5   Title Pt will perform 15 stairs with single rail on R side at distant S level to indicate safe negotiation into/out of apartment.  (Target Date: 12/14/14)                Plan - 10/19/14 1300    Clinical Impression Statement Pt presents with L CVA resulting in R hemiparesis approx 3 years ago with recent fall in bathroom 3 months ago, requiring hospitalization and resulting in fractured ribs.  Pt with increased pain in back and ribs, as well as in whole R side of body.  Note that she is currently not on any of her medications due to Medicaid issues, but sister working to resolve this today.  Upon PT evaluation (shortened due to pt late), gait speed is 1.75 ft/sec, indicative of recurrent fall risk and 5 time sit to stand test score of 56.07 seconds, indicative of fall risk and decreased functional strength.  Based on findings, pt would benefit from skilled OP Neuro PT to address deficits and decrease fall risk.     Pt will benefit from skilled therapeutic intervention in order to improve on the following deficits Abnormal gait;Decreased activity tolerance;Decreased balance;Decreased cognition;Decreased coordination;Decreased endurance;Decreased knowledge of precautions;Decreased knowledge of use of DME;Decreased mobility;Decreased range of motion;Decreased strength;Difficulty walking;Impaired perceived functional ability;Impaired sensation;Impaired tone;Impaired UE functional use;Postural dysfunction;Pain   Rehab Potential Good   Clinical Impairments Affecting Rehab Potential poor cognition at baseline   PT Frequency 2x / week   PT Duration 8 weeks   PT Treatment/Interventions ADLs/Self Care Home Management;DME Instruction;Gait training;Stair training;Functional mobility training;Therapeutic activities;Therapeutic exercise;Balance training;Neuromuscular re-education;Patient/family education;Orthotic Fit/Training;Energy conservation   PT Next Visit Plan Perform BERG balance test, assess use of SBQC (hoping that pt brings her personal one in to adjust height), balance (start with corner), RLE strengthening   Consulted and Agree with Plan of Care  Patient;Family member/caregiver   Family Member Consulted sister  G-Codes - 10/19/14 1616    Functional Assessment Tool Used gait speed 1.75 ft/ sec, 5TSS: 56.07   Functional Limitation Mobility: Walking and moving around   Mobility: Walking and Moving Around Current Status [phone]) At least 80 percent but less than 100 percent impaired, limited or restricted   Mobility: Walking and Moving Around Goal Status [phone]) At least 40 percent but less than 60 percent impaired, limited or restricted       Problem List Patient Active Problem List   Diagnosis Date Noted  . Hyperlipidemia 10/15/2014  . Essential hypertension 10/14/2014  . GERD (gastroesophageal reflux disease) 10/14/2014  . Seizures ([place]) 10/14/2014  . Vitamin D deficiency 10/14/2014  . Vitamin B12 deficiency 10/14/2014  . Asthma 10/14/2014  . CVA (cerebral vascular accident) ([place]) 10/14/2014    Cameron Sprang, PT, MPT [facility] [address] [place], [place], [address] Phone: [phone]   Fax:  [phone] 10/19/2014, 4:18 PM

## 2014-10-19 NOTE — Telephone Encounter (Signed)
Returned call and no answer.  Message left.

## 2014-10-19 NOTE — Telephone Encounter (Signed)
Pt states medicaid will not paid for meds. Please call pt back.

## 2014-10-19 NOTE — Telephone Encounter (Signed)
Talked with pt and she will call [facility] for financial info.

## 2014-10-19 NOTE — Therapy (Signed)
[facility] [address] [place], [place], [address] Phone: [phone]   Fax:  [phone]  Occupational Therapy Evaluation  Patient Details  Name: Tina Sanders MRN: [id number] Date of Birth: 10-26-1955 Referring Provider:  Jule Ser, DO  Encounter Date: 10/19/2014      OT End of Session - 10/19/14 1345    Visit Number 1   Number of Visits 16   Date for OT Re-Evaluation 12/14/14   Authorization Type medicare   Authorization Time Period 60 days   Authorization - Visit Number 1   Authorization - Number of Visits 16   OT Start Time 0930   OT Stop Time 1013   OT Time Calculation (min) 43 min   Activity Tolerance Patient tolerated treatment well      Past Medical History  Diagnosis Date  . GERD (gastroesophageal reflux disease)   . Seizures ([place])   . Hypertension   . B12 deficiency   . Vitamin D deficiency disease   . Stroke [facility])     residual right sided deficits, expressive dysphasia    History reviewed. No pertinent past surgical history.  Filed Vitals:   10/19/14 0937  BP: 138/85  Pulse: 92    Visit Diagnosis:  Hemiplegia affecting right dominant side (HCC)  Muscle weakness  Impaired sensation  Pain of right hand  Pain in joint of right shoulder  Lack of coordination due to stroke      Subjective Assessment - 10/19/14 0939    Subjective  I have alot of trouble with my speech.   Patient is accompained by: Family member  sister Blanch Media   Pertinent History see epic snapshot; pt with h/o of stroke 3 years ago   Patient Stated Goals my sister wanted me to come because she knows I need it for my whole left side and my hand.    Currently in Pain? Yes   Pain Score 8    Pain Location Hand   Pain Orientation Right   Pain Descriptors / Indicators Pins and needles   Pain Type Chronic pain   Pain Onset More than a month ago   Pain Frequency Intermittent   Aggravating Factors  bumping up  against things, press down on hand   Pain Relieving Factors pain medication and it states   Multiple Pain Sites Yes   Pain Score 6   Pain Location Shoulder   Pain Orientation Right   Pain Descriptors / Indicators Aching   Pain Type Chronic pain  pt states it is worse since a recent fall   Pain Onset More than a month ago  since the stroke but worse since recent fall   Pain Frequency Constant   Aggravating Factors  moving it   Pain Relieving Factors pain meds   Pain Score 9   Pain Location Rib cage   Pain Orientation Right   Pain Descriptors / Indicators Aching;Sore   Pain Type Acute pain   Pain Onset More than a month ago  pt fell about 3 months ago   Aggravating Factors  wakling around, coughing   Pain Relieving Factors rest, pain meds           [facility] OT Assessment - 10/19/14 0949    Assessment   Diagnosis L CVA   Onset Date --  10/2011 per pt and sister   Prior Therapy PT, OT and ST therapy after stroke   Precautions   Precautions Fall   Restrictions   Weight Bearing  Restrictions No   Balance Screen   Has the patient fallen in the past 6 months Yes   How many times? 1  pt with PT eval today   Home  Environment   Family/patient expects to be discharged to: Private residence   Living Arrangements Other relatives  living with sister   Available Help at Discharge Available 24 hours/day  other sister lives below    Type of [place] Shower/Tub Tub/Shower unit   Additional Comments Sister states she is in process of looking for one story house. Sister states pt gets all the way down in the tub with assistance and grab bar   Prior Function   Level of Independence Needs assistance with ADLs;Needs assistance with homemaking   Comments Pt had caregiver for 20 years however caregiver died recently in car accident.    ADL   Eating/Feeding Minimal assistance  for cutting only   Grooming Modified independent   Upper Body Bathing  Modified independent   Lower Body Bathing Modified independent   Upper Body Dressing Independent   Lower Body Dressing Independent   Toilet Tranfer Modified independent   Toileting - Clothing Manipulation Modified independent   [place]   Tub/Shower Transfer Moderate assistance   ADL comments Pt uses 3 in 1 over toilet   IADL   Shopping Needs to be accompanied on any shopping trip   Light Housekeeping Needs help with all home maintenance tasks   Meal Prep Able to complete simple cold meal and snack prep   Community Mobility Relies on family or friends for transportation   Medication Management Is not capable of dispensing or managing own medication   Financial Management Dependent   Mobility   Mobility Status Needs assist   Mobility Status Comments supervision in the community   Written Expression   Dominant Hand Right   Handwriting 100% legible  for first and last name   Vision - History   Baseline Vision Wears glasses only for reading   Vision Assessment   Comment Pt reports no visual changes that she is aware of after the stroke   Activity Tolerance   Activity Tolerance Tolerate 30+ min activity without fatigue   Cognition   Overall Cognitive Status History of cognitive impairments - at baseline   Sensation   Light Touch Impaired by gross assessment   Hot/Cold Impaired by gross assessment   Proprioception Impaired by gross assessment   Coordination   Gross Motor Movements are Fluid and Coordinated No   Fine Motor Movements are Fluid and Coordinated No   9 Hole Peg Test Right;Left   Right 9 Hole Peg Test 47.08  impacted by cognition   Left 9 Hole Peg Test 33.80   Box and Blocks 29  impacted by cognition   Tone   Assessment Location Right Upper Extremity   ROM / Strength   AROM / PROM / Strength AROM;Strength   AROM   Overall AROM  Deficits   Overall AROM Comments Pt with limited shoulder flexion to about 130* and abduction to about  80*. RUE is painful as are ribs and this impacts pt's ability to move her RUE.     Strength   Overall Strength Unable to assess   Overall Strength Comments Pt with significant pain in R rib cage and unable to tolerate MMT. Pt with at least 3/5   Hand Function   Right Hand Gross Grasp Impaired  Right Hand Grip (lbs) 35   Left Hand Gross Grasp Functional   Left Hand Grip (lbs) 50   RUE Tone   RUE Tone Mild;Hypertonic                           OT Short Term Goals - 10/19/14 1333    OT SHORT TERM GOAL #1   Title Pt and sister will be mod I with HEP - 11/16/2014   Status New   OT SHORT TERM GOAL #2   Title Pt will demonstrate improved grip strength by 3 pounds to assist with functional tasks (baseline = 35 pounds)   Status New   OT SHORT TERM GOAL #3   Title Pt will report no more than 6/10 pain in R hand (baseline = 8/10)   Status New   OT SHORT TERM GOAL #4   Title Pt and sister will verabilize understanding of AE for cutting   Status New           OT Long Term Goals - 10/19/14 1334    OT LONG TERM GOAL #1   Title Pt and sister will be mod I with upgraded HEP  prn - 12/14/2014   Status New   OT LONG TERM GOAL #2   Title Pt will demonstrate increased grip strength by 5 pounds to assist with functional tasks - baseline= 35 pounds   Status New   OT LONG TERM GOAL #3   Title Pt will demostrate improved coodination as evidenced by decreasing time on 9 hole peg by 3 seconds (baseline = 47.08)   Status New   OT LONG TERM GOAL #4   Title Pt will demonstrate at least 4/5 proximal strength to assist wtih functional tasks in RUE.    Status New   Long Term Additional Goals   Additional Long Term Goals Yes   OT LONG TERM GOAL #6   Title Pt will report no more than 5/10 pain in R shoulder   Status New               Plan - 10/19/14 1338    Clinical Impression Statement Pt is a 59 year old female s/p L CVA in 2013. Pt's sister reports pt only received  "spotty" therapy services. Pt had caregiver who recently died in car accident and therefore pt has moved in with sister  (pt was living in [place] prior to this). Pt fell approxinately 3 months ago and fractured R ribs - pt still with pain.  Pt presents with the following deficits today that impact her ability to complete some ADL's and IADL's as well as use RUE to full function: decreased balance,  pain in hand, pain in shoulder, decreased grip strength, decrease coordinaton, altered tone, altered sensation. Pt can benefit from skilled OT services to adress these areas and maximize independent functioning as well as functional use of RUE.    Pt will benefit from skilled therapeutic intervention in order to improve on the following deficits (Retired) Abnormal gait;Decreased balance;Decreased coordination;Decreased cognition;Decreased knowledge of use of DME;Decreased mobility;Decreased strength;Impaired UE functional use;Impaired tone;Impaired sensation;Pain   Rehab Potential Fair   Clinical Impairments Affecting Rehab Potential pt wtih premorbid cognitive deficits   OT Frequency 2x / week   OT Duration 8 weeks   OT Treatment/Interventions Self-care/ADL training;Electrical Stimulation;Fluidtherapy;DME and/or AE instruction;Neuromuscular education;Therapeutic exercise;Functional Mobility Training;Manual Therapy;Therapeutic activities;Balance training;Patient/family education   Plan initate HEP as pt can tolerate   Consulted  and Agree with Plan of Care Patient;Family member/caregiver   Family Member Consulted sister  Synetta Shadow - November 01, 2014 1347    Functional Assessment Tool Used 9 hole peg, dynamometer, box and blocks   Functional Limitation Carrying, moving and handling objects   Carrying, Moving and Handling Objects Current Status [phone]) At least 60 percent but less than 80 percent impaired, limited or restricted   Carrying, Moving and Handling Objects Goal Status ([id number]) At least 40  percent but less than 60 percent impaired, limited or restricted      Problem List Patient Active Problem List   Diagnosis Date Noted  . Hyperlipidemia 10/15/2014  . Essential hypertension 10/14/2014  . GERD (gastroesophageal reflux disease) 10/14/2014  . Seizures ([place]) 10/14/2014  . Vitamin D deficiency 10/14/2014  . Vitamin B12 deficiency 10/14/2014  . Asthma 10/14/2014  . CVA (cerebral vascular accident) ([place]) 10/14/2014    Quay Burow, OTR/L November 01, 2014, 1:49 PM  [facility] [address] [place], [place], [address] Phone: [phone]   Fax:  [phone]

## 2014-10-22 NOTE — Progress Notes (Signed)
Internal Medicine Clinic Attending  Case discussed with Dr. Wallace soon after the resident saw the patient.  We reviewed the resident's history and exam and pertinent patient test results.  I agree with the assessment, diagnosis, and plan of care documented in the resident's note. 

## 2014-10-26 ENCOUNTER — Emergency Department (HOSPITAL_COMMUNITY): Payer: Medicare Other

## 2014-10-26 ENCOUNTER — Inpatient Hospital Stay (HOSPITAL_COMMUNITY)
Admission: EM | Admit: 2014-10-26 | Discharge: 2014-11-01 | DRG: 101 | Disposition: A | Discharge status: Home Health | Payer: Medicare Other | Attending: Student in an Organized Health Care Education/Training Program | Admitting: Student in an Organized Health Care Education/Training Program

## 2014-10-26 ENCOUNTER — Ambulatory Visit: Payer: Medicare Other | Admitting: Physical Therapy

## 2014-10-26 ENCOUNTER — Encounter (HOSPITAL_COMMUNITY): Payer: Self-pay | Admitting: Emergency Medicine

## 2014-10-26 ENCOUNTER — Observation Stay (HOSPITAL_COMMUNITY): Payer: Medicare Other

## 2014-10-26 ENCOUNTER — Encounter: Payer: Self-pay | Admitting: Occupational Therapy

## 2014-10-26 ENCOUNTER — Ambulatory Visit: Payer: Medicare Other | Admitting: Occupational Therapy

## 2014-10-26 ENCOUNTER — Encounter: Payer: Self-pay | Admitting: Physical Therapy

## 2014-10-26 DIAGNOSIS — Z79899 Other long term (current) drug therapy: Secondary | ICD-10-CM

## 2014-10-26 DIAGNOSIS — Z880 Allergy status to penicillin: Secondary | ICD-10-CM

## 2014-10-26 DIAGNOSIS — E538 Deficiency of other specified B group vitamins: Secondary | ICD-10-CM | POA: Diagnosis present

## 2014-10-26 DIAGNOSIS — R1310 Dysphagia, unspecified: Secondary | ICD-10-CM | POA: Diagnosis present

## 2014-10-26 DIAGNOSIS — J690 Pneumonitis due to inhalation of food and vomit: Secondary | ICD-10-CM

## 2014-10-26 DIAGNOSIS — E559 Vitamin D deficiency, unspecified: Secondary | ICD-10-CM | POA: Diagnosis present

## 2014-10-26 DIAGNOSIS — R4702 Dysphasia: Secondary | ICD-10-CM | POA: Diagnosis present

## 2014-10-26 DIAGNOSIS — E785 Hyperlipidemia, unspecified: Secondary | ICD-10-CM | POA: Diagnosis present

## 2014-10-26 DIAGNOSIS — I639 Cerebral infarction, unspecified: Secondary | ICD-10-CM

## 2014-10-26 DIAGNOSIS — F17210 Nicotine dependence, cigarettes, uncomplicated: Secondary | ICD-10-CM | POA: Diagnosis present

## 2014-10-26 DIAGNOSIS — G40109 Localization-related (focal) (partial) symptomatic epilepsy and epileptic syndromes with simple partial seizures, not intractable, without status epilepticus: Principal | ICD-10-CM | POA: Diagnosis present

## 2014-10-26 DIAGNOSIS — G8191 Hemiplegia, unspecified affecting right dominant side: Secondary | ICD-10-CM

## 2014-10-26 DIAGNOSIS — R0902 Hypoxemia: Secondary | ICD-10-CM | POA: Diagnosis present

## 2014-10-26 DIAGNOSIS — R569 Unspecified convulsions: Secondary | ICD-10-CM

## 2014-10-26 DIAGNOSIS — I69351 Hemiplegia and hemiparesis following cerebral infarction affecting right dominant side: Secondary | ICD-10-CM

## 2014-10-26 DIAGNOSIS — E440 Moderate protein-calorie malnutrition: Secondary | ICD-10-CM | POA: Diagnosis present

## 2014-10-26 DIAGNOSIS — D571 Sickle-cell disease without crisis: Secondary | ICD-10-CM | POA: Diagnosis present

## 2014-10-26 DIAGNOSIS — Z9114 Patient's other noncompliance with medication regimen: Secondary | ICD-10-CM

## 2014-10-26 DIAGNOSIS — K219 Gastro-esophageal reflux disease without esophagitis: Secondary | ICD-10-CM | POA: Diagnosis present

## 2014-10-26 DIAGNOSIS — I6930 Unspecified sequelae of cerebral infarction: Secondary | ICD-10-CM | POA: Diagnosis present

## 2014-10-26 DIAGNOSIS — R4701 Aphasia: Secondary | ICD-10-CM | POA: Diagnosis present

## 2014-10-26 DIAGNOSIS — Z8249 Family history of ischemic heart disease and other diseases of the circulatory system: Secondary | ICD-10-CM

## 2014-10-26 DIAGNOSIS — R768 Other specified abnormal immunological findings in serum: Secondary | ICD-10-CM | POA: Diagnosis present

## 2014-10-26 DIAGNOSIS — I10 Essential (primary) hypertension: Secondary | ICD-10-CM | POA: Diagnosis present

## 2014-10-26 LAB — CBC
HCT: 39.6 % (ref 36.0–46.0)
HEMOGLOBIN: 13.9 g/dL (ref 12.0–15.0)
MCH: 31.7 pg (ref 26.0–34.0)
MCHC: 35.1 g/dL (ref 30.0–36.0)
MCV: 90.2 fL (ref 78.0–100.0)
Platelets: 301 10*3/uL (ref 150–400)
RBC: 4.39 MIL/uL (ref 3.87–5.11)
RDW: 13.6 % (ref 11.5–15.5)
WBC: 9.4 10*3/uL (ref 4.0–10.5)

## 2014-10-26 LAB — PROCALCITONIN: Procalcitonin: 0.34 ng/mL

## 2014-10-26 LAB — I-STAT CHEM 8, ED
BUN: 9 mg/dL (ref 6–20)
CALCIUM ION: 1.15 mmol/L (ref 1.13–1.30)
CHLORIDE: 99 mmol/L — AB (ref 101–111)
Creatinine, Ser: 0.9 mg/dL (ref 0.44–1.00)
GLUCOSE: 148 mg/dL — AB (ref 65–99)
HCT: 44 % (ref 36.0–46.0)
Hemoglobin: 15 g/dL (ref 12.0–15.0)
Potassium: 3.9 mmol/L (ref 3.5–5.1)
SODIUM: 136 mmol/L (ref 135–145)
TCO2: 22 mmol/L (ref 0–100)

## 2014-10-26 LAB — I-STAT TROPONIN, ED: Troponin i, poc: 0 ng/mL (ref 0.00–0.08)

## 2014-10-26 LAB — URINALYSIS, ROUTINE W REFLEX MICROSCOPIC
GLUCOSE, UA: NEGATIVE mg/dL
Hgb urine dipstick: NEGATIVE
Ketones, ur: NEGATIVE mg/dL
LEUKOCYTES UA: NEGATIVE
NITRITE: NEGATIVE
PH: 5.5 (ref 5.0–8.0)
Protein, ur: 100 mg/dL — AB
SPECIFIC GRAVITY, URINE: 1.015 (ref 1.005–1.030)
Urobilinogen, UA: 1 mg/dL (ref 0.0–1.0)

## 2014-10-26 LAB — COMPREHENSIVE METABOLIC PANEL
ALBUMIN: 3.4 g/dL — AB (ref 3.5–5.0)
ALK PHOS: 63 U/L (ref 38–126)
ALT: 20 U/L (ref 14–54)
ANION GAP: 11 (ref 5–15)
AST: 38 U/L (ref 15–41)
BUN: 8 mg/dL (ref 6–20)
CALCIUM: 8.7 mg/dL — AB (ref 8.9–10.3)
CO2: 20 mmol/L — AB (ref 22–32)
CREATININE: 0.84 mg/dL (ref 0.44–1.00)
Chloride: 99 mmol/L — ABNORMAL LOW (ref 101–111)
GFR calc Af Amer: >60 mL/min (ref >60)
GFR calc non Af Amer: >60 mL/min (ref >60)
GLUCOSE: 86 mg/dL (ref 65–99)
Potassium: 4 mmol/L (ref 3.5–5.1)
SODIUM: 130 mmol/L — AB (ref 135–145)
Total Bilirubin: 1 mg/dL (ref 0.3–1.2)
Total Protein: 6.4 g/dL — ABNORMAL LOW (ref 6.5–8.1)

## 2014-10-26 LAB — TSH: TSH: 0.742 u[IU]/mL (ref 0.350–4.500)

## 2014-10-26 LAB — I-STAT VENOUS BLOOD GAS, ED
ACID-BASE DEFICIT: 1 mmol/L (ref 0.0–2.0)
Bicarbonate: 24.2 mEq/L — ABNORMAL HIGH (ref 20.0–24.0)
O2 Saturation: 99 %
PH VEN: 7.374 — AB (ref 7.250–7.300)
TCO2: 25 mmol/L (ref 0–100)
pCO2, Ven: 41.4 mmHg — ABNORMAL LOW (ref 45.0–50.0)
pO2, Ven: 120 mmHg — ABNORMAL HIGH (ref 30.0–45.0)

## 2014-10-26 LAB — URINE MICROSCOPIC-ADD ON

## 2014-10-26 LAB — PHOSPHORUS: Phosphorus: 3.2 mg/dL (ref 2.5–4.6)

## 2014-10-26 LAB — CBG MONITORING, ED: Glucose-Capillary: 143 mg/dL — ABNORMAL HIGH (ref 65–99)

## 2014-10-26 LAB — MAGNESIUM: Magnesium: 1.7 mg/dL (ref 1.7–2.4)

## 2014-10-26 LAB — VITAMIN B12: Vitamin B-12: 1459 pg/mL — ABNORMAL HIGH (ref 180–914)

## 2014-10-26 MED ORDER — ENOXAPARIN SODIUM 40 MG/0.4ML ~~LOC~~ SOLN
40.0000 mg | SUBCUTANEOUS | Status: DC
  Administered 2014-10-26 – 2014-10-31 (×6): 40 mg via SUBCUTANEOUS
  Filled 2014-10-26 (×6): qty 0.4

## 2014-10-26 MED ORDER — LEVETIRACETAM 500 MG/5ML IV SOLN
500.0000 mg | Freq: Two times a day (BID) | INTRAVENOUS | Status: DC
  Administered 2014-10-26: 500 mg via INTRAVENOUS
  Filled 2014-10-26 (×2): qty 5

## 2014-10-26 MED ORDER — ASPIRIN 81 MG PO CHEW
81.0000 mg | CHEWABLE_TABLET | Freq: Every day | ORAL | Status: DC
  Administered 2014-10-27 – 2014-11-01 (×6): 81 mg via ORAL
  Filled 2014-10-26 (×8): qty 1

## 2014-10-26 MED ORDER — SODIUM CHLORIDE 0.9 % IJ SOLN
3.0000 mL | Freq: Two times a day (BID) | INTRAMUSCULAR | Status: DC
  Administered 2014-10-26 – 2014-11-01 (×10): 3 mL via INTRAVENOUS

## 2014-10-26 MED ORDER — SODIUM CHLORIDE 0.9 % IV SOLN
1000.0000 mg | Freq: Once | INTRAVENOUS | Status: AC
  Administered 2014-10-26: 1000 mg via INTRAVENOUS
  Filled 2014-10-26: qty 10

## 2014-10-26 MED ORDER — SODIUM CHLORIDE 0.9 % IV BOLUS (SEPSIS)
1000.0000 mL | Freq: Once | INTRAVENOUS | Status: AC
  Administered 2014-10-26: 1000 mL via INTRAVENOUS

## 2014-10-26 MED ORDER — PANTOPRAZOLE SODIUM 40 MG IV SOLR
40.0000 mg | INTRAVENOUS | Status: DC
  Administered 2014-10-26: 40 mg via INTRAVENOUS
  Filled 2014-10-26: qty 40

## 2014-10-26 MED ORDER — SODIUM CHLORIDE 0.9 % IV SOLN
3.0000 g | Freq: Three times a day (TID) | INTRAVENOUS | Status: DC
  Administered 2014-10-26 – 2014-10-27 (×2): 3 g via INTRAVENOUS
  Filled 2014-10-26 (×6): qty 3

## 2014-10-26 MED ORDER — ASPIRIN 300 MG RE SUPP
300.0000 mg | Freq: Every day | RECTAL | Status: DC
  Administered 2014-10-26: 300 mg via RECTAL
  Filled 2014-10-26: qty 1

## 2014-10-26 MED ORDER — ACETAMINOPHEN 325 MG PO TABS: 650.0000 mg | ORAL_TABLET | Freq: Four times a day (QID) | ORAL | Status: DC | PRN

## 2014-10-26 MED ORDER — ALBUTEROL SULFATE (2.5 MG/3ML) 0.083% IN NEBU: 2.5000 mg | INHALATION_SOLUTION | RESPIRATORY_TRACT | Status: DC | PRN

## 2014-10-26 MED ORDER — LORAZEPAM 2 MG/ML IJ SOLN: 1.0000 mg | INTRAMUSCULAR | Status: DC | PRN

## 2014-10-26 MED ORDER — ACETAMINOPHEN 650 MG RE SUPP: 650.0000 mg | Freq: Four times a day (QID) | RECTAL | Status: DC | PRN

## 2014-10-26 MED ORDER — ALBUTEROL SULFATE (5 MG/ML) 0.5% IN NEBU
2.5000 mg | INHALATION_SOLUTION | RESPIRATORY_TRACT | Status: DC | PRN
Start: 2014-10-26 — End: 2014-10-26

## 2014-10-26 NOTE — ED Notes (Signed)
Patient transported upstairs by Gerald Stabs, EMT

## 2014-10-26 NOTE — Progress Notes (Signed)
ANTIBIOTIC CONSULT NOTE - INITIAL  Pharmacy Consult for Unasyn Indication: Aspiration PNA  Allergies  Allergen Reactions  . Penicillins Other (See Comments)    abdominal bloating Has patient had a PCN reaction causing immediate rash, facial/tongue/throat swelling, SOB or lightheadedness with hypotension: No Has patient had a PCN reaction causing severe rash involving mucus membranes or skin necrosis: No Has patient had a PCN reaction that required hospitalization No Has patient had a PCN reaction occurring within the last 10 years: No If all of the above answers are "NO", then may proceed with Cephalosporin use.     Patient Measurements: TBW 53.6 kg  Vital Signs: Temp: 99.7 F (37.6 C) (10/17 1446) Temp Source: Oral (10/17 1446) BP: 124/63 mmHg (10/17 1500) Pulse Rate: 108 (10/17 1500) Intake/Output from previous day:   Intake/Output from this shift:    Labs:  Recent Labs  10/26/14 1356 10/26/14 1413  WBC 9.4  --   HGB 13.9 15.0  PLT 301  --   CREATININE  --  0.90   CrCl cannot be calculated (Unknown ideal weight.). No results for input(s): VANCOTROUGH, VANCOPEAK, VANCORANDOM, GENTTROUGH, GENTPEAK, GENTRANDOM, TOBRATROUGH, TOBRAPEAK, TOBRARND, AMIKACINPEAK, AMIKACINTROU, AMIKACIN in the last 72 hours.   Microbiology: No results found for this or any previous visit (from the past 720 hour(s)).  Medical History: Past Medical History  Diagnosis Date  . GERD (gastroesophageal reflux disease)   . Seizures ([place])   . Hypertension   . B12 deficiency   . Vitamin D deficiency disease   . Stroke [facility])     residual right sided deficits, expressive dysphasia    Assessment: 59 yo F presents on 10/17 with witnesses seizure. Concern for aspiration PNA so pharmacy consulted to dose Unasyn. Has a PCN allergy listed but is only abdominal bloating.  Afebrile, WBC wnl. SCr stable, CrCl ~5ml/min.  Goal of Therapy:  Resolution of infection  Plan:  Start Unasyn 3g IV Q8   Monitor clinical picture, renal function F/U C&S, abx deescalation / LOT  Tina Sanders J 10/26/2014,3:31 PM

## 2014-10-26 NOTE — Progress Notes (Signed)
MD paged to notify that patient is admitted to the unit.

## 2014-10-26 NOTE — ED Notes (Signed)
Admitting at the bedside,

## 2014-10-26 NOTE — Therapy (Signed)
[facility] [address] [place], [place], [address] Phone: [phone]   Fax:  [phone]  Patient Details  Name: Tina Sanders MRN: [id number] Date of Birth: May 18, 1955 Referring Provider:  No ref. provider found  Encounter Date: 10/26/2014 PHYSICAL THERAPY DISCHARGE SUMMARY  Visits from Start of Care: 1  Current functional level related to goals / functional outcomes: Unknown, as patient did not return after initial evaluation.     PT Long Term Goals - 10/19/14 1607    PT LONG TERM GOAL #1   Title Pt/family will be independent with HEP to indicate improvement in functional strength and balance. (Target Date: 12/14/14)   Time 8   Period Weeks   PT LONG TERM GOAL #2   Title Pt will increase 6MWT by 164' in order to indicate functional improvement in endurance. (Target Date: 12/14/14)   Time 8   Period Weeks   PT LONG TERM GOAL #3   Title Pt will increase BERG balance score by 8 points in order to indicate functional improvement in balance and decreased fall risk. (Target Date: 12/14/14)   Time 8   Period Weeks   PT LONG TERM GOAL #4   Title Pt will ambulate 400' w/ LRAD on outdoor paved uneven surfaces at S level in order to indicate safe community negotiation.  (Target Date: 12/14/14)   PT LONG TERM GOAL #5   Title Pt will perform 15 stairs with single rail on R side at distant S level to indicate safe negotiation into/out of apartment.  (Target Date: 12/14/14)        Remaining deficits: Unknown, as patient did not return after initial evaluation   Education / Equipment: N/A, as pt did not return to PT after evaluation.  Plan: Patient agrees to discharge.  Patient goals were not met. Patient is being discharged due to the patient's request.  ?????       Billie Ruddy, PT, [facility] [address] [place], [place], [address] Phone: [phone]   Fax:   [phone] 10/26/2014, 5:02 PM

## 2014-10-26 NOTE — ED Provider Notes (Signed)
CSN: [id number]     Arrival date & time 10/26/14  1318 History   First MD Initiated Contact with Patient 10/26/14 1323     Chief Complaint  Patient presents with  . Seizures   HPI  59 yo F PMH seizures (pt on Keppra), CVA (right-sided deficits, 2014) BIB EMS for unwitnessed seizure; however, sister thinks the seizure lasted approximately 15 minutes. Pt recently moved to [place], lives with sister, who states patient has been off of seizure medication at least one month. EMS gave 2.5 of versed PTA.  Past Medical History  Diagnosis Date  . GERD (gastroesophageal reflux disease)   . Seizures ([place])   . Hypertension   . B12 deficiency   . Vitamin D deficiency disease   . Stroke [facility])     residual right sided deficits, expressive dysphasia   History reviewed. No pertinent past surgical history. Family History  Problem Relation Age of Onset  . Hypertension Mother   . Hypertension Father   . Kidney disease Father   . Hyperlipidemia Father   . Hypertension Sister   . Diabetes Sister    Social History  Substance Use Topics  . Smoking status: Current Every Day Smoker -- 0.25 packs/day for 40 years    Types: Cigarettes  . Smokeless tobacco: None  . Alcohol Use: 8.4 oz/week    0 Standard drinks or equivalent, 14 Cans of beer per week   OB History    No data available     Review of Systems  Unable to perform ROS: Patient nonverbal   Allergies  Penicillins  Home Medications   Prior to Admission medications   Medication Sig Start Date End Date Taking? Authorizing Provider  albuterol (PROVENTIL) (5 MG/ML) 0.5% nebulizer solution Take 0.5 mLs (2.5 mg total) by nebulization every 6 (six) hours as needed for wheezing or shortness of breath. 10/14/14  Yes Jule Ser, DO  atorvastatin (LIPITOR) 20 MG tablet Take 1 tablet (20 mg total) by mouth daily at 6 PM. 10/15/14  Yes Jule Ser, DO  Cyanocobalamin (B-12) 2500 MCG SUBL Place 2,500 mcg under the tongue daily. 10/14/14  Yes Jule Ser, DO  ergocalciferol (VITAMIN D2) 50000 UNITS capsule Take 1 capsule (50,000 Units total) by mouth once a week. 10/14/14  Yes Jule Ser, DO  levETIRAcetam (KEPPRA) 500 MG tablet Take 1 tablet (500 mg total) by mouth 2 (two) times daily. 10/14/14  Yes Jule Ser, DO  Linaclotide [facility]) 145 MCG CAPS capsule Take 1 capsule (145 mcg total) by mouth daily. 10/14/14  Yes Jule Ser, DO  lisinopril-hydrochlorothiazide (ZESTORETIC) 10-12.5 MG tablet Take 1 tablet by mouth daily. 10/14/14 10/14/15 Yes Jule Ser, DO  megestrol (MEGACE) 400 MG/10ML suspension Take 5 mLs (200 mg total) by mouth 3 (three) times daily with meals. 10/14/14  Yes Jule Ser, DO  omeprazole (PRILOSEC) 20 MG capsule Take 1 capsule (20 mg total) by mouth daily. 10/14/14 10/14/15 Yes Jule Ser, DO  potassium chloride (K-DUR) 10 MEQ tablet Take 2 tablets (20 mEq total) by mouth daily. 10/14/14  Yes Jule Ser, DO   BP 124/63 mmHg  Pulse 108  Temp(Src) 99.7 F (37.6 C) (Oral)  Resp 23  SpO2 97% Physical Exam  Constitutional: She appears well-developed and well-nourished.  HENT:  Head: Normocephalic and atraumatic.  Mouth/Throat: Oropharynx is clear and moist. No oropharyngeal exudate.  Eyes: Conjunctivae are normal. Pupils are equal, round, and reactive to light. Right eye exhibits no discharge. Left eye exhibits no discharge. No scleral icterus.  No hemotympanum   Neck: No tracheal deviation present.  Cardiovascular: Regular rhythm, normal heart sounds and intact distal pulses.  Exam reveals no gallop and no friction rub.   No murmur heard. Tachycardic  Pulmonary/Chest: Breath sounds normal. She has no wheezes. She has no rales.  Pt on non-rebreather mask  Abdominal: Soft. Bowel sounds are normal. She exhibits no distension and no mass. There is no tenderness. There is no rebound and no guarding.  Musculoskeletal: She exhibits no edema.  Patient able to perform left hand grip, wiggle toes, and  lift left leg  Lymphadenopathy:    She has no cervical adenopathy.  Neurological: She is alert. Coordination normal.  Skin: Skin is warm and dry. No rash noted. She is not diaphoretic. No erythema.  Psychiatric:  Unable to assess  Nursing note and vitals reviewed.   ED Course  Procedures  Labs Review Labs Reviewed  URINALYSIS, ROUTINE W REFLEX MICROSCOPIC (NOT AT [facility]) - Abnormal; Notable for the following:    Bilirubin Urine MODERATE (*)    Protein, ur 100 (*)    All other components within normal limits  URINE MICROSCOPIC-ADD ON - Abnormal; Notable for the following:    Squamous Epithelial / LPF FEW (*)    Bacteria, UA MANY (*)    Casts HYALINE CASTS (*)    All other components within normal limits  I-STAT CHEM 8, ED - Abnormal; Notable for the following:    Chloride 99 (*)    Glucose, Bld 148 (*)    All other components within normal limits  CBG MONITORING, ED - Abnormal; Notable for the following:    Glucose-Capillary 143 (*)    All other components within normal limits  I-STAT VENOUS BLOOD GAS, ED - Abnormal; Notable for the following:    pH, Ven 7.374 (*)    pCO2, Ven 41.4 (*)    pO2, Ven 120.0 (*)    Bicarbonate 24.2 (*)    All other components within normal limits  CBC  BLOOD GAS, VENOUS  I-STAT TROPOININ, ED    Imaging Review Ct Head Wo Contrast  10/26/2014  CLINICAL DATA:  Patient has seizure lasting for 10-15 minutes. Witnessed seizure. Patient had uncontrolled movements during scan which limits evaluation. Repeat scan was performed. EXAM: CT HEAD WITHOUT CONTRAST TECHNIQUE: Contiguous axial images were obtained from the base of the skull through the vertex without intravenous contrast. COMPARISON:  None. FINDINGS: Repeat imaging improved head motion. No acute intracranial hemorrhage. No focal mass lesion. No CT evidence of acute infarction. No midline shift or mass effect. No hydrocephalus. Basilar cisterns are patent. Focal hypodensity in the in the LEFT  external capsule, LEFT insular ribbon and centrum semiovale. There is mild ventricular dilatation on the LEFT associated with volume loss. Paranasal sinuses and  mastoid air cells are clear. IMPRESSION: 1. No acute intracranial findings. 2. Chronic infarction in the deep white matter and insular ribbon on the LEFT with mild ex vacuo dilatation of the lateral ventricle. Electronically Signed   By: Suzy Bouchard M.D.   On: 10/26/2014 14:34   Dg Chest Port 1 View  10/26/2014  CLINICAL DATA:  Seizure.  Rightward gaze.  Cough and congestion. EXAM: PORTABLE CHEST 1 VIEW COMPARISON:  None. FINDINGS: Right rotated chest radiograph. Top-normal heart size. Mildly tortuous thoracic aorta. Otherwise normal mediastinal contour. No pneumothorax. No pleural effusion. There is patchy opacity at the right greater than left lung bases. No pulmonary edema. IMPRESSION: Patchy opacity at the right greater than left  lung bases, suspicious for pneumonia and/or aspiration. Electronically Signed   By: Ilona Sorrel M.D.   On: 10/26/2014 13:58   I have personally reviewed and evaluated these images and lab results as part of my medical decision-making.   EKG Interpretation   Date/Time:  Monday October 26 2014 13:26:40 EDT Ventricular Rate:  131 PR Interval:  151 QRS Duration: 77 QT Interval:  307 QTC Calculation: 453 R Axis:   -98 Text Interpretation:  Sinus tachycardia Probable left atrial enlargement  Inferior infarct, old No old tracing to compare Confirmed by KNAPP  MD-J,  JON ([id number]) on 10/26/2014 1:33:48 PM      MDM   Final diagnoses:  Seizure ([place])  Aspiration pneumonia of right lower lobe, unspecified aspiration pneumonia type ([place])   Ordered EKG, troponin, CBG,chem 8.   Due to tachycardia, will order CXR, urinalysis to look for sources of infection.   Ordered CT head for leftward gaze of right eye (sister unable to determine if this is baseline).   Reviewed CXR with Dr. Tomi Bamberger and he advised  adding VBG to work-up. Dr. Tomi Bamberger to see patient.  CXR concerning for aspiration pneumonia.    RN states pt back from CT.  CT demonstrated no acute findings.   Pt 94% O2 saturation on 2 L O2 [place]. Pt still postictal but appears to be improving and more alert.   Ordered 1 g keppra.   Discussed case with Dr. Tomi Bamberger who recommends admission for seizures and hypoxia due to aspiration pneumonia.  Lorre Munroe, PA-C consulted with hospitalist who agrees with the plan.   [place], [place] 10/26/14 [phone]

## 2014-10-26 NOTE — ED Provider Notes (Signed)
Patient with known seizure disorder.  Had witnessed seizure today.  Family thinks seizure lasted about 15 minutes.  Given versed by EMS.  Concern for aspiration.  Patient is tachycardic and hypoxic on room air.  O2 Sat is normal on non-rebreather.    CXR concerning for aspiration or pneumonia.  Suspect that this is aspiration.  CXR reviewed with Dr. Tomi Bamberger.  Patient is still postictal but is starting to return to baseline.    Labs and imaging is pending.  Patient desats to upper 80s on RA.  Discussed with Dr. Tomi Bamberger, who recommends admission for seizure with aspiration.  Given 1000mg  of Keppra.  Appreciate IM teaching service for admitting the patient.  Montine Circle, PA-C 10/26/14 1528  Dorie Rank, MD 10/27/14 [phone]

## 2014-10-26 NOTE — ED Notes (Signed)
Pt returned from CT °

## 2014-10-26 NOTE — ED Notes (Signed)
Pt here from home with c/o seizures , pt has history of seizures and stroke , according to family seizure lasted for 10 to 15 mins  Pt received 2.5 of versed by ems

## 2014-10-26 NOTE — Progress Notes (Signed)
Patient admitted to the unit from ER. Patient alert but confused. Family at bed side. Patient made comfortable. Will continue to monitor.

## 2014-10-26 NOTE — H&P (Signed)
Date: 10/26/2014               Patient Name:  Tina Sanders MRN: [id number]  DOB: [birth year] Age / Sex: 59 y.o., female   PCP: No primary care provider on file.         Medical Service: Internal Medicine Teaching Service         Attending Physician: Dr. Axel Filler, MD    First Contact: Dr. Lovena Le Pager: [phone]  Second Contact: Dr. Gordy Levan Pager: [phone]       After Hours (After 5p/  First Contact Pager: [phone]  weekends / holidays): Second Contact Pager: [phone]   Chief Complaint: seizure  History of Present Illness: Ms. Redel is a 59 yo female with HTN, GERD, h/o seizure d/o, and h/o CVA with residual right sided weakness, presenting with seizure.  History is obtained from the patient's sister.  She states that the patient was at her baseline this morning and bathing herself.  The sister then fell asleep and was woken by a neighbor who witnessed the patient lying on the floor shaking.  The sister reports shaking of upper and lower extremities.  No tongue biting or loss of bowel/bladder control was noted. The sister witnessed the seizure for about 10-15 minutes before it was aborted by Versed provided by EMS.  The sister does not know when the seizure began.  She has continued to be confused since she arrived in the ED.  The patient has a h/o seizure disorder, but the sister does not know what her typical seizures look like.  She was managed on Keppra in the past, but has not been taking it for the last month, as the patient moved to [facility] but could not get her Medicare switched.    The sister states that the patient had a stroke 1-2 years ago, with residual right sided weakness that is steadily improving. She was amble to ambulate and walk up/down stairs without assistance.  She also states the patient was able to talk fluently and conversantly prior to the seizure today.  Per the sister, the patient has not been complaining of fever, chills, runny nose, congestion, N/V,  constipation, diarrhea, or dysuria.  She has had 1 week of light, nonproductive cough.  In the ED, the patient was tachycardic and hypoxic on arrival, desatting to upper 80%s on RA.  CXR demonstrated a R>L lower lobe patchy opacity concerning for PNA/aspiration.  The patient has no h/o dysphagia.  Meds: Current Facility-Administered Medications  Medication Dose Route Frequency Provider Last Rate Last Dose  . acetaminophen (TYLENOL) tablet 650 mg  650 mg Oral Q6H PRN Juluis Mire, MD       Or  . acetaminophen (TYLENOL) suppository 650 mg  650 mg Rectal Q6H PRN Marjan Rabbani, MD      . albuterol (PROVENTIL) (5 MG/ML) 0.5% nebulizer solution 2.5 mg  2.5 mg Nebulization Q4H PRN Marjan Rabbani, MD      . Ampicillin-Sulbactam (UNASYN) 3 g in sodium chloride 0.9 % 100 mL IVPB  3 g Intravenous Q8H Cecilio Asper Batchelder, RPH   3 g at 10/26/14 1619  . [START ON 10/27/2014] aspirin chewable tablet 81 mg  81 mg Oral Daily Marjan Rabbani, MD      . aspirin suppository 300 mg  300 mg Rectal Daily Marjan Rabbani, MD      . enoxaparin (LOVENOX) injection 40 mg  40 mg Subcutaneous Q24H Juluis Mire, MD      . Derrill Memo ON 10/27/2014]  levETIRAcetam (KEPPRA) 500 mg in sodium chloride 0.9 % 100 mL IVPB  500 mg Intravenous Q12H Marjan Rabbani, MD      . LORazepam (ATIVAN) injection 1 mg  1 mg Intravenous PRN Marjan Rabbani, MD      . pantoprazole (PROTONIX) injection 40 mg  40 mg Intravenous Q24H Marjan Rabbani, MD      . sodium chloride 0.9 % injection 3 mL  3 mL Intravenous Q12H Juluis Mire, MD        Allergies: Allergies as of 10/26/2014 - Review Complete 10/26/2014  Allergen Reaction Noted  . Penicillins Other (See Comments) 10/14/2014   Past Medical History  Diagnosis Date  . GERD (gastroesophageal reflux disease)   . Seizures ([place])   . Hypertension   . B12 deficiency   . Vitamin D deficiency disease   . Stroke [facility])     residual right sided deficits, expressive dysphasia   History reviewed.  No pertinent past surgical history. Family History  Problem Relation Age of Onset  . Hypertension Mother   . Hypertension Father   . Kidney disease Father   . Hyperlipidemia Father   . Hypertension Sister   . Diabetes Sister    Social History   Social History  . Marital Status: Single    Spouse Name: N/A  . Number of Children: N/A  . Years of Education: N/A   Occupational History  . Not on file.   Social History Main Topics  . Smoking status: Current Every Day Smoker -- 0.25 packs/day for 40 years    Types: Cigarettes  . Smokeless tobacco: Not on file  . Alcohol Use: 8.4 oz/week    0 Standard drinks or equivalent, 14 Cans of beer per week  . Drug Use: Not on file  . Sexual Activity: Not on file   Other Topics Concern  . Not on file   Social History Narrative    Review of Systems: Review of systems not obtained due to patient factors.  Physical Exam: Blood pressure 138/75, pulse 101, temperature 99.2 F (37.3 C), temperature source Oral, resp. rate 22, SpO2 97 %. Physical Exam  Constitutional: No distress.  Frail female, lying on right side, curled in the bed, in NAD.  HENT:  Head: Normocephalic and atraumatic.  Eyes:  PERRL.  EOMI.  Neck: Normal range of motion.  Cardiovascular: Normal heart sounds and intact distal pulses.   Tachyardic. Regular rhythm.   Pulmonary/Chest:  Poor inspiratory effort.  Coarse inspiratory sound appreciated in RLL. No wheezes or crackles.  Abdominal: Soft. She exhibits no distension. There is no tenderness. There is no rebound and no guarding.  Musculoskeletal: She exhibits no edema.  Neurological: She is alert.  Intermittently following commands.  Expressive aphasia.  Responds to verbal stimuli and mimics appropriately.  PERRL. EOMI. CN V unable to be assessed due to patient's aphasia. CN VII: Minimal weakness of left orbicularis oculi.  Slightly diminished cheek puff on left. CN IX-XI unable to be assessed due to patient  noncooperation. CNII: tongue projects midline  Biceps reflexes 1+ and symmetric Patellar reflexes could not be assessed Babinski upgoing on right.  Normal on left.  Skin: Skin is warm and dry. No rash noted. She is not diaphoretic.     Lab results: Basic Metabolic Panel:  Recent Labs  10/26/14 1413  NA 136  K 3.9  CL 99*  GLUCOSE 148*  BUN 9  CREATININE 0.90   Liver Function Tests: No results for input(s): AST, ALT, ALKPHOS, BILITOT,  PROT, ALBUMIN in the last 72 hours. No results for input(s): LIPASE, AMYLASE in the last 72 hours. No results for input(s): AMMONIA in the last 72 hours. CBC:  Recent Labs  10/26/14 1356 10/26/14 1413  WBC 9.4  --   HGB 13.9 15.0  HCT 39.6 44.0  MCV 90.2  --   PLT 301  --    Cardiac Enzymes: No results for input(s): CKTOTAL, CKMB, CKMBINDEX, TROPONINI in the last 72 hours. BNP: No results for input(s): PROBNP in the last 72 hours. D-Dimer: No results for input(s): DDIMER in the last 72 hours. CBG:  Recent Labs  10/26/14 1353  GLUCAP 143*   Hemoglobin A1C: No results for input(s): HGBA1C in the last 72 hours. Fasting Lipid Panel: No results for input(s): CHOL, HDL, LDLCALC, TRIG, CHOLHDL, LDLDIRECT in the last 72 hours. Thyroid Function Tests: No results for input(s): TSH, T4TOTAL, FREET4, T3FREE, THYROIDAB in the last 72 hours. Anemia Panel: No results for input(s): VITAMINB12, FOLATE, FERRITIN, TIBC, IRON, RETICCTPCT in the last 72 hours. Coagulation: No results for input(s): LABPROT, INR in the last 72 hours. Urine Drug Screen: Drugs of Abuse  No results found for: LABOPIA, COCAINSCRNUR, LABBENZ, AMPHETMU, THCU, LABBARB  Alcohol Level: No results for input(s): ETH in the last 72 hours. Urinalysis:  Recent Labs  10/26/14 1404  COLORURINE YELLOW  LABSPEC 1.015  PHURINE 5.5  GLUCOSEU NEGATIVE  HGBUR NEGATIVE  BILIRUBINUR MODERATE*  KETONESUR NEGATIVE  PROTEINUR 100*  UROBILINOGEN 1.0  NITRITE NEGATIVE    LEUKOCYTESUR NEGATIVE   Misc. Labs:   Imaging results:  Ct Head Wo Contrast  10/26/2014  CLINICAL DATA:  Patient has seizure lasting for 10-15 minutes. Witnessed seizure. Patient had uncontrolled movements during scan which limits evaluation. Repeat scan was performed. EXAM: CT HEAD WITHOUT CONTRAST TECHNIQUE: Contiguous axial images were obtained from the base of the skull through the vertex without intravenous contrast. COMPARISON:  None. FINDINGS: Repeat imaging improved head motion. No acute intracranial hemorrhage. No focal mass lesion. No CT evidence of acute infarction. No midline shift or mass effect. No hydrocephalus. Basilar cisterns are patent. Focal hypodensity in the in the LEFT external capsule, LEFT insular ribbon and centrum semiovale. There is mild ventricular dilatation on the LEFT associated with volume loss. Paranasal sinuses and  mastoid air cells are clear. IMPRESSION: 1. No acute intracranial findings. 2. Chronic infarction in the deep white matter and insular ribbon on the LEFT with mild ex vacuo dilatation of the lateral ventricle. Electronically Signed   By: Suzy Bouchard M.D.   On: 10/26/2014 14:34   Dg Chest Port 1 View  10/26/2014  CLINICAL DATA:  Seizure.  Rightward gaze.  Cough and congestion. EXAM: PORTABLE CHEST 1 VIEW COMPARISON:  None. FINDINGS: Right rotated chest radiograph. Top-normal heart size. Mildly tortuous thoracic aorta. Otherwise normal mediastinal contour. No pneumothorax. No pleural effusion. There is patchy opacity at the right greater than left lung bases. No pulmonary edema. IMPRESSION: Patchy opacity at the right greater than left lung bases, suspicious for pneumonia and/or aspiration. Electronically Signed   By: Ilona Sorrel M.D.   On: 10/26/2014 13:58    Other results: EKG: sinus tachycardia, q-waves in II, III, and aVF.  Assessment & Plan by Problem: Principal Problem:   Seizures ([place]) Active Problems:   Essential hypertension   GERD  (gastroesophageal reflux disease)   CVA (cerebral vascular accident) ([place])   Hyperlipidemia  Ms. Wyble is a 59 yo female with HTN, GERD, h/o seizure d/o, and h/o  CVA with residual right sided weakness, presenting with seizure.  Tonic-Clonic Seizure: Duration of seizure unknown, but at least 10-15 minutes and requiring Versed to terminate.  Patient remains post-ictal approximately 4 hours after the seizure terminated.  She has been off anti-epileptics (Keppra) for at least one month.  Patient has a h/o seizure, though the underlying etiology or usual manifestation of her seizure is unknown.  Current seizure episode likely due to medication nonadherence, as family has been unable to obtain her medications.  Although CT head was negative for acute bleed or stroke, patient remains aphasic.  It is possible her aphasia is still her post-ictal phase, but there is concern for acute stroke precipitating the acute seizure.  Patient has not been on ASA or statin. [ ]  MRI brain - ASA 325 mg PR - Hold PO medications (statin, lisinopril-HCTZ) - Keppra 1000 mg IV load and 500 mg BID - [place] [ ]  SLP [ ]  PT/OT  Aspiration Pneumonia: Concern for aspiration PNA given patient's hypoxia on arrival to ED and CXR findings, requiring nonrebreather.  Patient oxygen saturation eventually improved to 99% on 2L [place].  - Unasyn overnight and possible switch to PO  H/o CVA: Sister believes CVA 1-2 years ago, with right sided weakness improving. At baseline, patient able to ambulate and fluently converse.  Although patient records from her previous PCP are still pending, it does not appear she was on ASA therapy.  Current concern for new stroke. - ASA [ ]  MRI brain  GERD: Protonix IV HTN: Hold Lisinopril-HCTZ  FEN/GI: - NPO pending speech eval - Protonix IV  DVT Ppx: Lovenox  Dispo: Disposition is deferred at this time, awaiting improvement of current medical problems. Anticipated discharge in approximately 1 day(s).    The patient does have a current PCP ([facility]) and does not need an [facility] hospital follow-up appointment after discharge.  The patient does not have transportation limitations that hinder transportation to clinic appointments.  Signed: Iline Oven, MD, PhD 10/26/2014, 4:40 PM

## 2014-10-26 NOTE — Therapy (Signed)
[facility] [address] [place], [place], [address] Phone: [phone]   Fax:  [phone]  Occupational Therapy Treatment  Patient Details  Name: Tina Sanders MRN: [id number] Date of Birth: April 10, 1955 No Data Recorded  Encounter Date: 10/26/2014    Past Medical History  Diagnosis Date  . GERD (gastroesophageal reflux disease)   . Seizures ([place])   . Hypertension   . B12 deficiency   . Vitamin D deficiency disease   . Stroke [facility])     residual right sided deficits, expressive dysphasia    No past surgical history on file.  There were no vitals filed for this visit.  Visit Diagnosis:  Hemiplegia affecting right dominant side ([place])                              OT Short Term Goals - 10/26/14 1627    OT SHORT TERM GOAL #1   Title Pt and sister will be mod I with HEP - 11/16/2014   Status New   OT SHORT TERM GOAL #2   Title Pt will demonstrate improved grip strength by 3 pounds to assist with functional tasks (baseline = 35 pounds)   Status New   OT SHORT TERM GOAL #3   Title Pt will report no more than 6/10 pain in R hand (baseline = 8/10)   Status New   OT SHORT TERM GOAL #4   Title Pt and sister will verabilize understanding of AE for cutting   Status New           OT Long Term Goals - 10/26/14 1627    OT LONG TERM GOAL #1   Title Pt and sister will be mod I with upgraded HEP  prn - 12/14/2014   Status New   OT LONG TERM GOAL #2   Title Pt will demonstrate increased grip strength by 5 pounds to assist with functional tasks - baseline= 35 pounds   Status New   OT LONG TERM GOAL #3   Title Pt will demostrate improved coodination as evidenced by decreasing time on 9 hole peg by 3 seconds (baseline = 47.08)   Status New   OT LONG TERM GOAL #4   Title Pt will demonstrate at least 4/5 proximal strength to assist wtih functional tasks in RUE.    Status New   OT LONG TERM GOAL  #6   Title Pt will report no more than 5/10 pain in R shoulder   Status New      Pt did not show for her OT appointment today. When I called her sister, she stated that the pt was still having a great deal of discomfort from her broken ribs and she did not feel she was ready for therapies yet. Discussed putting pt on hold vs discharge and pt's sister asked that we discharge her for now and she will get new orders when she feels pt can tolerate.     Problem List Patient Active Problem List   Diagnosis Date Noted  . Seizure ([place]) 10/26/2014  . Hyperlipidemia 10/15/2014  . Essential hypertension 10/14/2014  . GERD (gastroesophageal reflux disease) 10/14/2014  . Seizures ([place]) 10/14/2014  . Vitamin D deficiency 10/14/2014  . Vitamin B12 deficiency 10/14/2014  . Asthma 10/14/2014  . CVA (cerebral vascular accident) ([place]) 10/14/2014   OCCUPATIONAL THERAPY DISCHARGE SUMMARY  Visits from Start of Care: 1  Current functional level related to goals /  functional outcomes: See above goals and note   Remaining deficits: See eval - pt came for eval but no treatment sessions   Education / Equipment: N/A - pt only participated in evaluation Plan: Patient agrees to discharge.  Patient goals were not met. Patient is being discharged due to the patient's request.  ?????     ,  Halliday, OTR/L 10/26/2014, 4:28 PM  [facility] [address] [place], [place], [address] Phone: [phone]   Fax:  [phone]  Name: Deannie Makela MRN: [id number] Date of Birth: 04/03/1955   

## 2014-10-27 ENCOUNTER — Observation Stay (HOSPITAL_COMMUNITY)
Admit: 2014-10-27 | Discharge: 2014-10-27 | Disposition: A | Discharge status: Home / Self Care | Payer: Medicare Other | Attending: Pediatrics | Admitting: Pediatrics

## 2014-10-27 ENCOUNTER — Inpatient Hospital Stay (HOSPITAL_COMMUNITY): Payer: Medicare Other

## 2014-10-27 ENCOUNTER — Ambulatory Visit: Payer: Medicare Other | Admitting: Rehabilitation

## 2014-10-27 ENCOUNTER — Ambulatory Visit: Payer: Medicare Other

## 2014-10-27 ENCOUNTER — Ambulatory Visit: Payer: Medicare Other | Admitting: Occupational Therapy

## 2014-10-27 DIAGNOSIS — E440 Moderate protein-calorie malnutrition: Secondary | ICD-10-CM | POA: Diagnosis present

## 2014-10-27 DIAGNOSIS — Z79899 Other long term (current) drug therapy: Secondary | ICD-10-CM | POA: Diagnosis not present

## 2014-10-27 DIAGNOSIS — K219 Gastro-esophageal reflux disease without esophagitis: Secondary | ICD-10-CM | POA: Diagnosis present

## 2014-10-27 DIAGNOSIS — R4701 Aphasia: Secondary | ICD-10-CM | POA: Diagnosis present

## 2014-10-27 DIAGNOSIS — Z9114 Patient's other noncompliance with medication regimen: Secondary | ICD-10-CM | POA: Diagnosis not present

## 2014-10-27 DIAGNOSIS — I69351 Hemiplegia and hemiparesis following cerebral infarction affecting right dominant side: Secondary | ICD-10-CM | POA: Diagnosis not present

## 2014-10-27 DIAGNOSIS — I10 Essential (primary) hypertension: Secondary | ICD-10-CM | POA: Diagnosis present

## 2014-10-27 DIAGNOSIS — E559 Vitamin D deficiency, unspecified: Secondary | ICD-10-CM | POA: Diagnosis present

## 2014-10-27 DIAGNOSIS — E785 Hyperlipidemia, unspecified: Secondary | ICD-10-CM | POA: Diagnosis present

## 2014-10-27 DIAGNOSIS — R0902 Hypoxemia: Secondary | ICD-10-CM | POA: Diagnosis present

## 2014-10-27 DIAGNOSIS — R1310 Dysphagia, unspecified: Secondary | ICD-10-CM | POA: Diagnosis present

## 2014-10-27 DIAGNOSIS — Z880 Allergy status to penicillin: Secondary | ICD-10-CM | POA: Diagnosis not present

## 2014-10-27 DIAGNOSIS — R768 Other specified abnormal immunological findings in serum: Secondary | ICD-10-CM | POA: Diagnosis present

## 2014-10-27 DIAGNOSIS — R5381 Other malaise: Secondary | ICD-10-CM | POA: Diagnosis not present

## 2014-10-27 DIAGNOSIS — Z8249 Family history of ischemic heart disease and other diseases of the circulatory system: Secondary | ICD-10-CM | POA: Diagnosis not present

## 2014-10-27 DIAGNOSIS — R4702 Dysphasia: Secondary | ICD-10-CM | POA: Diagnosis present

## 2014-10-27 DIAGNOSIS — R569 Unspecified convulsions: Secondary | ICD-10-CM | POA: Diagnosis present

## 2014-10-27 DIAGNOSIS — E538 Deficiency of other specified B group vitamins: Secondary | ICD-10-CM | POA: Diagnosis present

## 2014-10-27 DIAGNOSIS — G40109 Localization-related (focal) (partial) symptomatic epilepsy and epileptic syndromes with simple partial seizures, not intractable, without status epilepticus: Secondary | ICD-10-CM | POA: Diagnosis present

## 2014-10-27 DIAGNOSIS — F17210 Nicotine dependence, cigarettes, uncomplicated: Secondary | ICD-10-CM | POA: Diagnosis present

## 2014-10-27 DIAGNOSIS — D571 Sickle-cell disease without crisis: Secondary | ICD-10-CM | POA: Diagnosis present

## 2014-10-27 LAB — BASIC METABOLIC PANEL
ANION GAP: 12 (ref 5–15)
BUN: 5 mg/dL — ABNORMAL LOW (ref 6–20)
CO2: 20 mmol/L — AB (ref 22–32)
Calcium: 9.1 mg/dL (ref 8.9–10.3)
Chloride: 106 mmol/L (ref 101–111)
Creatinine, Ser: 0.71 mg/dL (ref 0.44–1.00)
GLUCOSE: 76 mg/dL (ref 65–99)
POTASSIUM: 3.5 mmol/L (ref 3.5–5.1)
Sodium: 138 mmol/L (ref 135–145)

## 2014-10-27 LAB — RAPID URINE DRUG SCREEN, HOSP PERFORMED
Amphetamines: NOT DETECTED
BENZODIAZEPINES: POSITIVE — AB
Barbiturates: NOT DETECTED
COCAINE: NOT DETECTED
OPIATES: NOT DETECTED
TETRAHYDROCANNABINOL: NOT DETECTED

## 2014-10-27 LAB — VITAMIN D 25 HYDROXY (VIT D DEFICIENCY, FRACTURES): Vit D, 25-Hydroxy: 54 ng/mL (ref 30.0–100.0)

## 2014-10-27 LAB — HIV ANTIBODY (ROUTINE TESTING W REFLEX): HIV Screen 4th Generation wRfx: NONREACTIVE

## 2014-10-27 LAB — COMMENT2 - HEP PANEL

## 2014-10-27 LAB — HEPATITIS C ANTIBODY (REFLEX): HCV Ab: >11 s/co ratio — ABNORMAL HIGH (ref 0.0–0.9)

## 2014-10-27 LAB — HEMOGLOBIN A1C
Hgb A1c MFr Bld: <4 % — ABNORMAL LOW (ref 4.8–5.6)
Mean Plasma Glucose: <68 mg/dL

## 2014-10-27 MED ORDER — LEVETIRACETAM 500 MG PO TABS: 500.0000 mg | ORAL_TABLET | Freq: Two times a day (BID) | ORAL | Status: DC

## 2014-10-27 MED ORDER — LORAZEPAM 2 MG/ML IJ SOLN
1.0000 mg | INTRAMUSCULAR | Status: AC | PRN
  Administered 2014-10-27: 1 mg via INTRAVENOUS
  Filled 2014-10-27: qty 1

## 2014-10-27 MED ORDER — SODIUM CHLORIDE 0.9 % IV SOLN
1000.0000 mg | INTRAVENOUS | Status: AC
  Administered 2014-10-27: 1000 mg via INTRAVENOUS
  Filled 2014-10-27: qty 10

## 2014-10-27 MED ORDER — PANTOPRAZOLE SODIUM 40 MG PO TBEC
40.0000 mg | DELAYED_RELEASE_TABLET | Freq: Every day | ORAL | Status: DC
  Administered 2014-10-27 – 2014-11-01 (×6): 40 mg via ORAL
  Filled 2014-10-27 (×7): qty 1

## 2014-10-27 MED ORDER — ATORVASTATIN CALCIUM 10 MG PO TABS
20.0000 mg | ORAL_TABLET | Freq: Every day | ORAL | Status: DC
  Administered 2014-10-27 – 2014-10-29 (×3): 20 mg via ORAL
  Filled 2014-10-27 (×4): qty 2

## 2014-10-27 MED ORDER — LEVETIRACETAM 100 MG/ML PO SOLN
500.0000 mg | Freq: Two times a day (BID) | ORAL | Status: DC
  Administered 2014-10-27: 500 mg via ORAL
  Filled 2014-10-27 (×4): qty 5

## 2014-10-27 MED ORDER — SODIUM CHLORIDE 0.9 % IV SOLN
1000.0000 mg | Freq: Two times a day (BID) | INTRAVENOUS | Status: DC
  Administered 2014-10-27 – 2014-10-29 (×5): 1000 mg via INTRAVENOUS
  Filled 2014-10-27 (×8): qty 10

## 2014-10-27 NOTE — Progress Notes (Signed)
Occupational Therapy Evaluation Patient Details Name: Tina Sanders MRN: [id number] DOB: Nov 09, 1955 Today's Date: 10/27/2014    History of Present Illness Pt is a 59 y/o female with HTN, GERD, h/o seizure d/o, and CVA with residual right sided weakness, presenting with seizure.Pt had stroke 2013; was living with a brother in [place] who was caregiver and killed in [place], precipitating her move here to sister's residence. Baseline able to ambulate and converse fluently. [place] three months ago sustaining fractured ribs with ongoing pain.   Clinical Impression   Pt admitted with the above diagnoses and presents with below problem list. Pt will benefit from continued acute OT to address the below listed deficits and maximize independence with BADLs. PTA pt was independent to supervision level with ADLs. Pt is currently mod to max A with ADLs. Session details below. OT to continue to follow acutely and recommend CIR at d/c for further therapy prior to d/c home with sister.      Follow Up Recommendations  CIR    Equipment Recommendations  Other (comment) (defer to next venue)    Recommendations for Other Services Rehab consult     Precautions / Restrictions Precautions Precautions: Fall Precaution Comments: Residual R sided weakness from prior stroke Restrictions Weight Bearing Restrictions: No      Mobility Bed Mobility Overal bed mobility: Needs Assistance Bed Mobility: Supine to Sit     Supine to sit: Min guard     General bed mobility comments: min guard for safety  Transfers Overall transfer level: Needs assistance Equipment used: 1 person hand held assist;None Transfers: Sit to/from Stand Sit to Stand: Min assist         General transfer comment: Min-mod A for balance from EOB.      Balance Overall balance assessment: Needs assistance Sitting-balance support: Feet supported;Bilateral upper extremity supported Sitting balance-Leahy Scale: Fair     Standing  balance support: During functional activity Standing balance-Leahy Scale: Poor Standing balance comment: close min guard to min A for static standing balance                            ADL Overall ADL's : Needs assistance/impaired Eating/Feeding: Minimal assistance;Sitting   Grooming: Maximal assistance;Standing Grooming Details (indicate cue type and reason): ideational apraxia noted; pt using comb to brush chin. mouth, and eyes. Min-mod A for balance in standing. Redirection needed for safety with pt touching comb to eyes.  Upper Body Bathing: Sitting;Maximal assistance   Lower Body Bathing: Maximal assistance;Sit to/from stand   Upper Body Dressing : Maximal assistance;Sitting   Lower Body Dressing: Maximal assistance;Sit to/from stand   Toilet Transfer: Ambulation;Moderate assistance;BSC;RW   Toileting- Clothing Manipulation and Hygiene: Maximal assistance;Sit to/from Nurse, children's Details (indicate cue type and reason): Pt has been sponge bathing recently due to rib pain from previous fall.  Functional mobility during ADLs: Moderate assistance General ADL Comments: Pt presents with decreased balance, decreased cognition, decreased safety awareness, impulsivity, impaired communication, and ideational apraixa impacting level of assist with ADLs. Pt's sisters present for session and report cogntion, balance, and communication are not at pt's baseline.      Vision Additional Comments: Vision difficult to assess due to impaired communication and likely impaired cognition.    Perception     Praxis      Pertinent Vitals/Pain Pain Assessment: Faces Faces Pain Scale: No hurt     Hand Dominance Left   Extremity/Trunk Assessment Upper  Extremity Assessment Upper Extremity Assessment: RUE deficits/detail;Generalized weakness RUE Deficits / Details: pt with impaired forearm supination and wrist/hand extension likely residual from previous CVA.  RUE  Coordination: decreased fine motor   Lower Extremity Assessment Lower Extremity Assessment: Defer to PT evaluation RLE Deficits / Details: Decreased strength and AROM consistent with residual deficits from prior CVA.  RLE Coordination: decreased fine motor;decreased gross motor   Cervical / Trunk Assessment Cervical / Trunk Assessment: Normal   Communication Communication Communication: Receptive difficulties;Expressive difficulties   Cognition Arousal/Alertness: Awake/alert Behavior During Therapy: Restless;Impulsive Overall Cognitive Status: Difficult to assess Area of Impairment: Attention;Following commands;Safety/judgement;Awareness;Problem solving   Current Attention Level: Selective   Following Commands: Follows one step commands inconsistently;Follows one step commands with increased time Safety/Judgement: Decreased awareness of safety;Decreased awareness of deficits Awareness: Emergent Problem Solving: Slow processing;Decreased initiation;Difficulty sequencing;Requires verbal cues;Requires tactile cues     General Comments       Exercises       Shoulder Instructions      Home Living Family/patient expects to be discharged to:: Private residence Living Arrangements: Other relatives Available Help at Discharge: Family;Available 24 hours/day Type of Home: Apartment Home Access: Stairs to enter Entrance Stairs-Number of Steps: 15   Home Layout: One level     Bathroom Shower/Tub: International aid/development worker Accessibility: Yes   Home Equipment: Grab bars - tub/shower          Prior Functioning/Environment Level of Independence: Independent        Comments: Sponge bathing at sink due to rib pain s/p fractures.    OT Diagnosis: Cognitive deficits;Generalized weakness;Apraxia;Other (comment) (impaired balance)   OT Problem List: Impaired balance (sitting and/or standing);Decreased cognition;Decreased safety awareness;Decreased knowledge of use of DME  or AE;Decreased knowledge of precautions;Decreased strength   OT Treatment/Interventions: Self-care/ADL training;Neuromuscular education;DME and/or AE instruction;Therapeutic activities;Cognitive remediation/compensation;Patient/family education;Balance training    OT Goals(Current goals can be found in the care plan section) Acute Rehab OT Goals Patient Stated Goal: not stated OT Goal Formulation: With patient/family Time For Goal Achievement: 11/10/14 Potential to Achieve Goals: Good ADL Goals Pt Will Perform Grooming: with modified independence;standing Pt Will Perform Upper Body Bathing: with set-up;sitting Pt Will Perform Lower Body Bathing: with supervision;sit to/from stand Pt Will Perform Upper Body Dressing: with set-up;sitting Pt Will Perform Lower Body Dressing: with supervision;sit to/from stand Pt Will Transfer to Toilet: with modified independence;ambulating Pt Will Perform Toileting - Clothing Manipulation and hygiene: with modified independence;sit to/from stand Additional ADL Goal #1: Pt will voice needs during simple ADL task with min cueing.  Additional ADL Goal #2: Pt will attend to simple ADL task for 5 minutes with min cueing.   OT Frequency: Min 3X/week   Barriers to D/C:            Co-evaluation PT/OT/SLP Co-Evaluation/Treatment: Yes Reason for Co-Treatment: For patient/therapist safety   OT goals addressed during session: ADL's and self-care      End of Session Equipment Utilized During Treatment: Gait belt Nurse Communication: Mobility status;Other (comment) (impulsive )  Activity Tolerance: Patient tolerated treatment well Patient left: in chair;with call bell/phone within reach;with chair alarm set;with family/visitor present   Time: 0086-7619 OT Time Calculation (min): 33 min Charges:  OT General Charges $OT Visit: 1 Procedure OT Evaluation $Initial OT Evaluation Tier I: 1 Procedure G-Codes:    Hortencia Pilar 19-Nov-2014, 3:10 PM

## 2014-10-27 NOTE — Progress Notes (Signed)
PT Cancellation Note  Patient Details Name: Tina Sanders MRN: [id number] DOB: October 28, 1955   Cancelled Treatment:    Reason Eval/Treat Not Completed: Patient at procedure or test/unavailable. Staff arrived for EEG as therapist was about to enter room. Will check back as schedule allows to complete PT eval.    Rolinda Roan 10/27/2014, 10:37 AM   Rolinda Roan, PT, DPT Acute Rehabilitation Services Pager: [phone]

## 2014-10-27 NOTE — Progress Notes (Signed)
OT Cancellation Note  Patient Details Name: Tina Sanders MRN: [id number] DOB: [birth year]   Cancelled Treatment:    Reason Eval/Treat Not Completed: Other (comment) (Pt is with SLP.) OT to reattempt evaluation as schedule permits.  Hortencia Pilar 10/27/2014, 10:14 AM

## 2014-10-27 NOTE — Evaluation (Signed)
Clinical/Bedside Swallow Evaluation Patient Details  Name: Tina Sanders MRN: [id number] Date of Birth: 16-Mar-1955  Today's Date: 10/27/2014 Time: SLP Start Time (ACUTE ONLY): 1026 SLP Stop Time (ACUTE ONLY): 1036 SLP Time Calculation (min) (ACUTE ONLY): 10 min  Past Medical History:  Past Medical History  Diagnosis Date  . GERD (gastroesophageal reflux disease)   . Seizures ([facility])   . Hypertension   . B12 deficiency   . Vitamin D deficiency disease   . Stroke [facility])     residual right sided deficits, expressive dysphasia   Past Surgical History: History reviewed. No pertinent past surgical history. HPI:  59 yo female with HTN, GERD, h/o seizure d/o, and h/o CVA with residual right sided weakness, presenting with seizure. History is obtained from the patient's sister.  Pt had stroke 2013; was living with a brother in [place] who was caregiver and killed in [place], precipitating her move here to sister's residence. [place] able to ambulate and converse fluently. [place] three months ago sustaining fractured ribs with ongoing pain.  Recent assessments at OP neurorehab and was getting ready to begin therapies, including OT/PT/ST.     Assessment / Plan / Recommendation Clinical Impression  Pt presents with an acute dysphagia characterized by poor oral control with spillage of all POs from oral cavity and no awareness of such; oral residue post-swallow; likely delayed swallow response with delayed cough noted after consumption of thin liquids.  Pt extremely impulsive; follows simple commands; no propositional speech today - vocalizing only.  Recommend initiating a dysphagia 1 diet with nectar-thick liquids for now; SLP will follow diet toleration and necessity of instrumental swallow study.  Sister agrees with plan.     Aspiration Risk  Moderate    Diet Recommendation Dysphagia 1 (Puree);Nectar   Medication Administration: Whole meds with puree Compensations: Slow rate;Small sips/bites    Other   Recommendations Oral Care Recommendations: Oral care BID Other Recommendations: Order thickener from pharmacy   Follow Up Recommendations       Frequency and Duration min 2x/week  2 weeks   SLP Swallow Goals     Swallow Study Prior Functional Status       General Date of Onset: 10/26/14 Other Pertinent Information: 59 yo female with HTN, GERD, h/o seizure d/o, and h/o CVA with residual right sided weakness, presenting with seizure. History is obtained from the patient's sister.  Pt had stroke 2013; was living with a brother in [place] who was caregiver and killed in [place], precipitating her move here to sister's residence. [place] able to ambulate and converse fluently. [place] three months ago sustaining fractured ribs with ongoing pain.  Recent assessments at OP neurorehab and was getting ready to begin therapies, including OT/PT/ST.   Type of Study: Bedside swallow evaluation Previous Swallow Assessment: none per records Diet Prior to this Study: NPO Temperature Spikes Noted: No Respiratory Status: Room air History of Recent Intubation: No Behavior/Cognition: Alert;Confused;Impulsive Self-Feeding Abilities: Needs assist;Able to feed self Patient Positioning: Upright in bed Baseline Vocal Quality: Normal (vocalizing but no real words produced) Volitional Cough: Cognitively unable to elicit Volitional Swallow: Unable to elicit    Oral/Motor/Sensory Function Overall Oral Motor/Sensory Function:  (symmetric at baseline; not following commands)   Ice Chips Ice chips: Not tested   Thin Liquid Thin Liquid: Impaired Presentation: Cup Oral Phase Impairments: Reduced labial seal (spilling copious amounts of liquid down chin/chest with no a) Oral Phase Functional Implications: Right anterior spillage;Left anterior spillage Pharyngeal  Phase Impairments: Suspected delayed Swallow;Cough - Delayed  Nectar Thick Nectar Thick Liquid: Impaired Presentation: Cup Oral Phase Impairments: Reduced  labial seal Pharyngeal Phase Impairments: Suspected delayed Swallow   Honey Thick Honey Thick Liquid: Not tested   Puree Puree: Impaired Presentation: Spoon Oral Phase Impairments: Reduced labial seal Oral Phase Functional Implications: Oral residue Pharyngeal Phase Impairments: Suspected delayed Swallow   Solid   GO Functional Assessment Tool Used: clinical judgement Functional Limitations: Swallowing Swallow Current Status ([id number]): At least 40 percent but less than 60 percent impaired, limited or restricted Swallow Goal Status [id number]): At least 40 percent but less than 60 percent impaired, limited or restricted Swallow Discharge Status [phone]): At least 40 percent but less than 60 percent impaired, limited or restricted  Solid: Not tested       Juan Quam Laurice 10/27/2014,10:45 AM

## 2014-10-27 NOTE — Progress Notes (Signed)
Initial Nutrition Assessment  DOCUMENTATION CODES:   Non-severe (moderate) malnutrition in context of acute illness/injury  INTERVENTION:  Provide Magic cup TID with meals, each supplement provides 290 kcal and 9 grams of protein Add Ensure Enlive when able to consume regular liquids  NUTRITION DIAGNOSIS:   Inadequate oral intake related to poor appetite as evidenced by per patient/family report, meal completion < 25%, mild depletion of body fat, mild depletion of muscle mass.   GOAL:   Patient will meet greater than or equal to 90% of their needs   MONITOR:   PO intake, Supplement acceptance, Labs, Weight trends, Skin, Diet advancement  REASON FOR ASSESSMENT:   Consult Assessment of nutrition requirement/status  ASSESSMENT:   59 yo female with HTN, GERD, h/o seizure d/o, and h/o CVA with residual right sided weakness, presenting with seizure.  Pt unable to speak clearly at this time; history obtained from patient's sister at bedside. Per pt's sister, pt has been living with sister for the past 3 weeks and pt was eating well with 3 meals daily up until this past week, pt started eating less than one meal per day. No complaints of nausea or abdominal pain per family. Pt usually drinks one Ensure supplement daily. Per family at bedside, pt only ate a few bites of lunch today and was NPO at breakfast. Per family, pt usually weighs 136 lbs and is about 5'3". Last weight is from 3 weeks ago and pt was 13% below her usual weight at that time. Pt has mild fat and muscle wasting per physical exam. Encouraged PO intake and intake of Magic Cup ice cream supplements.   Labs reviewed.   Diet Order:  DIET - DYS 1 Room service appropriate?: Yes; Fluid consistency:: Nectar Thick  Skin:  Reviewed, no issues  Last BM:  PTA  Height:   Ht Readings from Last 1 Encounters:  10/27/14 5\' 3"  (1.6 m)    Weight:   Wt Readings from Last 1 Encounters:  10/14/14 118 lb 1.6 oz (53.57 kg)     Ideal Body Weight:  52.3 kg  BMI:  There is no weight on file to calculate BMI.  Estimated Nutritional Needs:   Kcal:  1400-1600  Protein:  65-75 grams  Fluid:  1.4-1.6 L/day  EDUCATION NEEDS:   No education needs identified at this time  [place], LDN Inpatient Clinical Dietitian Pager: [phone] After Hours Pager: [phone]

## 2014-10-27 NOTE — Progress Notes (Signed)
Rehab Admissions Coordinator Note:  Patient was screened by Cleatrice Burke for appropriateness for an Inpatient Acute Rehab Consult per PT and OT recommendations.At this time, we are recommending Inpatient Rehab consult.  Cleatrice Burke 10/27/2014, 6:08 PM  I can be reached at [phone].

## 2014-10-27 NOTE — Evaluation (Signed)
Physical Therapy Evaluation Patient Details Name: Tina Sanders MRN: [id number] DOB: 03-Nov-1955 Today's Date: 10/27/2014   History of Present Illness  Pt is a 59 y/o female with HTN, GERD, h/o seizure d/o, and CVA with residual right sided weakness, presenting with seizure.Pt had stroke 2013; was living with a brother in [place] who was caregiver and killed in [place], precipitating her move here to sister's residence. Baseline able to ambulate and converse fluently. [place] three months ago sustaining fractured ribs with ongoing pain.  Clinical Impression  Pt admitted with above diagnosis. Pt currently with functional limitations due to the deficits listed below (see PT Problem List). At the time of PT eval pt was able to perform transfers and ambulation with min to mod assist for antalgic gait and difficulty maintaining balance. Feel this patient would benefit from continued rehab at the SNF level prior to return home to maximize functional independence. Appears to have good family support from 2 sisters. Pt will benefit from skilled PT to increase their independence and safety with mobility to allow discharge to the venue listed below.       Follow Up Recommendations CIR;Supervision/Assistance - 24 hour    Equipment Recommendations  None recommended by PT    Recommendations for Other Services Rehab consult     Precautions / Restrictions Precautions Precautions: Fall Precaution Comments: Residual R sided weakness from prior stroke Restrictions Weight Bearing Restrictions: No      Mobility  Bed Mobility Overal bed mobility: Needs Assistance Bed Mobility: Supine to Sit     Supine to sit: Min guard     General bed mobility comments: min guard for safety  Transfers Overall transfer level: Needs assistance Equipment used: 1 person hand held assist;None Transfers: Sit to/from Stand Sit to Stand: Min assist         General transfer comment: Min-mod A for balance from EOB.     Ambulation/Gait Ambulation/Gait assistance: Min assist;Mod assist Ambulation Distance (Feet): 150 Feet Assistive device: 1 person hand held assist Gait Pattern/deviations: Step-through pattern;Decreased stride length;Wide base of support;Antalgic Gait velocity: Decreased Gait velocity interpretation: Below normal speed for age/gender General Gait Details: Pt was able to ambulate in hall with min-mod assist for balance. Very antalgic gait pattern.  Stairs            Wheelchair Mobility    Modified Rankin (Stroke Patients Only) Modified Rankin (Stroke Patients Only) Pre-Morbid Rankin Score: No significant disability Modified Rankin: Moderately severe disability     Balance Overall balance assessment: Needs assistance Sitting-balance support: Feet supported;Bilateral upper extremity supported Sitting balance-Leahy Scale: Fair     Standing balance support: During functional activity Standing balance-Leahy Scale: Poor Standing balance comment: close min guard to min A for static standing balance                             Pertinent Vitals/Pain Pain Assessment: Faces Faces Pain Scale: No hurt    Home Living Family/patient expects to be discharged to:: Private residence Living Arrangements: Other relatives Available Help at Discharge: Family;Available 24 hours/day Type of Home: Apartment Home Access: Stairs to enter   Entrance Stairs-Number of Steps: 15 Home Layout: One level Home Equipment: Grab bars - tub/shower      Prior Function Level of Independence: Independent         Comments: Sponge bathing at sink due to rib pain s/p fractures.     Hand Dominance   Dominant Hand: Left  Extremity/Trunk Assessment   Upper Extremity Assessment: RUE deficits/detail;Generalized weakness RUE Deficits / Details: pt with impaired forearm supination and wrist/hand extension likely residual from previous CVA.          Lower Extremity Assessment:  Defer to PT evaluation RLE Deficits / Details: Decreased strength and AROM consistent with residual deficits from prior CVA.     Cervical / Trunk Assessment: Normal  Communication   Communication: Receptive difficulties;Expressive difficulties  Cognition Arousal/Alertness: Awake/alert Behavior During Therapy: Restless;Impulsive Overall Cognitive Status: Difficult to assess Area of Impairment: Attention;Following commands;Safety/judgement;Awareness;Problem solving   Current Attention Level: Selective   Following Commands: Follows one step commands inconsistently;Follows one step commands with increased time Safety/Judgement: Decreased awareness of safety;Decreased awareness of deficits Awareness: Emergent Problem Solving: Slow processing;Decreased initiation;Difficulty sequencing;Requires verbal cues;Requires tactile cues      General Comments      Exercises        Assessment/Plan    PT Assessment Patient needs continued PT services  PT Diagnosis Difficulty walking;Abnormality of gait   PT Problem List Decreased strength;Decreased range of motion;Decreased activity tolerance;Decreased balance;Decreased mobility;Decreased coordination;Decreased cognition;Decreased knowledge of use of DME;Decreased safety awareness;Decreased knowledge of precautions  PT Treatment Interventions DME instruction;Stair training;Gait training;Functional mobility training;Therapeutic activities;Therapeutic exercise;Neuromuscular re-education;Patient/family education;Cognitive remediation   PT Goals (Current goals can be found in the Care Plan section) Acute Rehab PT Goals Patient Stated Goal: not stated PT Goal Formulation: Patient unable to participate in goal setting Time For Goal Achievement: 11/10/14 Potential to Achieve Goals: Good    Frequency Min 3X/week   Barriers to discharge        Co-evaluation PT/OT/SLP Co-Evaluation/Treatment: Yes Reason for Co-Treatment: For patient/therapist  safety PT goals addressed during session: Mobility/safety with mobility;Balance OT goals addressed during session: ADL's and self-care       End of Session Equipment Utilized During Treatment: Gait belt Activity Tolerance: Patient tolerated treatment well Patient left: in chair;with call bell/phone within reach;with family/visitor present;with chair alarm set Nurse Communication: Mobility status         Time: 6256-3893 PT Time Calculation (min) (ACUTE ONLY): 33 min   Charges:   PT Evaluation $Initial PT Evaluation Tier I: 1 Procedure     PT G CodesRolinda Roan 18-Nov-2014, 3:35 PM   Rolinda Roan, PT, DPT Acute Rehabilitation Services Pager: [phone]

## 2014-10-27 NOTE — Consult Note (Signed)
NEURO HOSPITALIST CONSULT NOTE   Referring physician: Evette Doffing   Reason for Consult: Break though seizure in setting on medication non-compliance.   HPI:                                                                                                                                          Minami Arriaga is an 59 y.o. female with history of seizure disorder and previous CVA with residual right sided weakness. Per notes "patient was at her baseline this morning and bathing herself. The sister then fell asleep and was woken by a neighbor who witnessed the patient lying on the floor shaking. The sister reports shaking of upper and lower extremities. No tongue biting or loss of bowel/bladder control was noted. The sister witnessed the seizure for about 10-15 minutes before it was aborted by Versed provided by EMS." Per notes she has not been taking her Keppra for the last month due to moving to [facility] and not getting her medicare switched. She was loaded with 500 mg IV yesterday and had one dose at 1200 today.  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 No electrographic seizures noted.On arrival to the room patient is awake but confused.  She looks at me and can track my fingers.  She is showing focal right lower chin repetitive twitching that stopped with administration of Ativan.  STAT 1 gram Keppra was brought to bedside and administered.   Past Medical History  Diagnosis Date  . GERD (gastroesophageal reflux disease)   . Seizures ([place])   . Hypertension   . B12 deficiency   . Vitamin D deficiency disease   . Stroke [facility])     residual right sided deficits, expressive dysphasia    History reviewed. No pertinent past surgical history.  Family History  Problem Relation Age of Onset  .  Hypertension Mother   . Hypertension Father   . Kidney disease Father   . Hyperlipidemia Father   . Hypertension Sister   . Diabetes Sister      Social History:  reports that she has been smoking Cigarettes.  She has a 10 pack-year smoking history. She does not have any smokeless tobacco history on file. She reports that she drinks about 8.4 oz of alcohol per week. Her drug history is not on file.  Allergies  Allergen Reactions  . Penicillins Other (See Comments)    abdominal bloating Has patient had a PCN reaction causing immediate rash, facial/tongue/throat swelling, SOB or lightheadedness with hypotension: No Has patient had a PCN reaction causing severe rash involving mucus membranes or skin necrosis: No Has patient had a  PCN reaction that required hospitalization No Has patient had a PCN reaction occurring within the last 10 years: No If all of the above answers are "NO", then may proceed with Cephalosporin use.     MEDICATIONS:                                                                                                                     Prior to Admission:  Prescriptions prior to admission  Medication Sig Dispense Refill Last Dose  . albuterol (PROVENTIL) (5 MG/ML) 0.5% nebulizer solution Take 0.5 mLs (2.5 mg total) by nebulization every 6 (six) hours as needed for wheezing or shortness of breath. 20 mL 12 unknown  . atorvastatin (LIPITOR) 20 MG tablet Take 1 tablet (20 mg total) by mouth daily at 6 PM. 30 tablet 2 unknown  . ergocalciferol (VITAMIN D2) 50000 UNITS capsule Take 1 capsule (50,000 Units total) by mouth once a week. 12 capsule 0 unknown  . levETIRAcetam (KEPPRA) 500 MG tablet Take 1 tablet (500 mg total) by mouth 2 (two) times daily. 60 tablet 2 unknown  . Linaclotide (LINZESS) 145 MCG CAPS capsule Take 1 capsule (145 mcg total) by mouth daily. 30 capsule 2 unknown  . lisinopril-hydrochlorothiazide (ZESTORETIC) 10-12.5 MG tablet Take 1 tablet by mouth daily.  30 tablet 2 unknown  . megestrol (MEGACE) 400 MG/10ML suspension Take 5 mLs (200 mg total) by mouth 3 (three) times daily with meals. 240 mL 2 unknown  . omeprazole (PRILOSEC) 20 MG capsule Take 1 capsule (20 mg total) by mouth daily. 30 capsule 2 unknown  . potassium chloride (K-DUR) 10 MEQ tablet Take 2 tablets (20 mEq total) by mouth daily. 60 tablet 2 unknown  . [DISCONTINUED] Cyanocobalamin (B-12) 2500 MCG SUBL Place 2,500 mcg under the tongue daily. 30 tablet 2 unknown   Scheduled: . aspirin  81 mg Oral Daily  . atorvastatin  20 mg Oral q1800  . enoxaparin (LOVENOX) injection  40 mg Subcutaneous Q24H  . levETIRAcetam  1,000 mg Intravenous Q12H  . levETIRAcetam  1,000 mg Intravenous STAT  . pantoprazole  40 mg Oral Daily  . sodium chloride  3 mL Intravenous Q12H     ROS:                                                                                                                                       History obtained from unobtainable from  patient due to mental status   Blood pressure 141/83, pulse 82, temperature 99.1 F (37.3 C), temperature source Oral, resp. rate 16, height 5\' 3"  (1.6 m), SpO2 97 %.   Neurologic Examination:                                                                                                      HEENT-  Normocephalic, no lesions, without obvious abnormality.  Normal external eye and conjunctiva.  Normal TM's bilaterally.  Normal auditory canals and external ears. Normal external nose, mucus membranes and septum.  Normal pharynx. Cardiovascular- S1, S2 normal, pulses palpable throughout   Lungs- chest clear, no wheezing, rales, normal symmetric air entry Abdomen- normal findings: bowel sounds normal Extremities- no edema Lymph-no adenopathy palpable Musculoskeletal-no joint tenderness, deformity or swelling Skin-warm and dry, no hyperpigmentation, vitiligo, or suspicious lesions  Neurological Examination Mental Status: Alert, not oriented  shows expressive aphasia and repeats herself.   Follows no commands but tracts my fingers with her eyes.  Cranial Nerves: II: Discs flat bilaterally;blinks to threat bilaterally, pupils equal, round, reactive to light and accommodation III,IV, VI: ptosis not present, extra-ocular motions intact bilaterally V,VII: face symmetric, facial light touch sensation normal bilaterally--initially has right lower face twitching which stopped with administration of ativan VIII: hearing normal bilaterally IX,X: uvula rises symmetrically XI: bilateral shoulder shrug XII: midline tongue extension Motor: Moving all extremities antigravity spontaneously but is noted to move her right  LE less then left and have mild increased tone of right arm--however she is also resisting movements during exam.  Sensory: withdraws from pain in all extremities Deep Tendon Reflexes: 2+ and symmetric throughout Plantars: Right: downgoing   Left: downgoing Cerebellar: Unable to assess Gait: unable to assess      Lab Results: Basic Metabolic Panel:  Recent Labs Lab 10/26/14 1413 10/26/14 1710 10/27/14 0550  NA 136 130* 138  K 3.9 4.0 3.5  CL 99* 99* 106  CO2  --  20* 20*  GLUCOSE 148* 86 76  BUN 9 8 5*  CREATININE 0.90 0.84 0.71  CALCIUM  --  8.7* 9.1  MG  --  1.7  --   PHOS  --  3.2  --     Liver Function Tests:  Recent Labs Lab 10/26/14 1710  AST 38  ALT 20  ALKPHOS 63  BILITOT 1.0  PROT 6.4*  ALBUMIN 3.4*   No results for input(s): LIPASE, AMYLASE in the last 168 hours. No results for input(s): AMMONIA in the last 168 hours.  CBC:  Recent Labs Lab 10/26/14 1356 10/26/14 1413  WBC 9.4  --   HGB 13.9 15.0  HCT 39.6 44.0  MCV 90.2  --   PLT 301  --     Cardiac Enzymes: No results for input(s): CKTOTAL, CKMB, CKMBINDEX, TROPONINI in the last 168 hours.  Lipid Panel: No results for input(s): CHOL, TRIG, HDL, CHOLHDL, VLDL, LDLCALC in the last 168 hours.  CBG:  Recent Labs Lab  10/26/14 1353  GLUCAP 143*    Microbiology: No results found for this or any  previous visit.  Coagulation Studies: No results for input(s): LABPROT, INR in the last 72 hours.  Imaging: Ct Head Wo Contrast  10/26/2014  CLINICAL DATA:  Patient has seizure lasting for 10-15 minutes. Witnessed seizure. Patient had uncontrolled movements during scan which limits evaluation. Repeat scan was performed. EXAM: CT HEAD WITHOUT CONTRAST TECHNIQUE: Contiguous axial images were obtained from the base of the skull through the vertex without intravenous contrast. COMPARISON:  None. FINDINGS: Repeat imaging improved head motion. No acute intracranial hemorrhage. No focal mass lesion. No CT evidence of acute infarction. No midline shift or mass effect. No hydrocephalus. Basilar cisterns are patent. Focal hypodensity in the in the LEFT external capsule, LEFT insular ribbon and centrum semiovale. There is mild ventricular dilatation on the LEFT associated with volume loss. Paranasal sinuses and  mastoid air cells are clear. IMPRESSION: 1. No acute intracranial findings. 2. Chronic infarction in the deep white matter and insular ribbon on the LEFT with mild ex vacuo dilatation of the lateral ventricle. Electronically Signed   By: Suzy Bouchard M.D.   On: 10/26/2014 14:34   Dg Chest Port 1 View  10/26/2014  CLINICAL DATA:  Seizure.  Rightward gaze.  Cough and congestion. EXAM: PORTABLE CHEST 1 VIEW COMPARISON:  None. FINDINGS: Right rotated chest radiograph. Top-normal heart size. Mildly tortuous thoracic aorta. Otherwise normal mediastinal contour. No pneumothorax. No pleural effusion. There is patchy opacity at the right greater than left lung bases. No pulmonary edema. IMPRESSION: Patchy opacity at the right greater than left lung bases, suspicious for pneumonia and/or aspiration. Electronically Signed   By: Ilona Sorrel M.D.   On: 10/26/2014 13:58    Etta Quill PA-C [facility] [phone]  10/27/2014, 3:21 PM   Assessment/Plan:  59 YO female with break though seizure in setting of medication noncompliance. Currently she is showing intermittent right facial twitching which is responsive to Ativan and expressive/receptive aphasia which could be secondary to seizure/post-ictal versus acute CVA.   Recommend: 1) STAT 1 gram Keppra now (done) 2) increase Keppra to 1 gram BID IV 3) MRI brain.    I personally participated in this patient's evaluation and management, including formulating above clinical impression and management recommendations.  Rush Farmer M.D. [facility] [phone]

## 2014-10-27 NOTE — Progress Notes (Signed)
EEG completed; results pending.    

## 2014-10-27 NOTE — Progress Notes (Signed)
Subjective: [name].  Patient more alert than yesterday, though still exhibits expressive aphasia.  She follows commands, but cannot repeat sentences.  Objective: Vital signs in last 24 hours: Filed Vitals:   10/26/14 2212 10/27/14 0125 10/27/14 0542 10/27/14 0959  BP: 127/69 136/68 123/90 140/85  Pulse: 110 87 97 91  Temp: 98.4 F (36.9 C) 98 F (36.7 C) 97.9 F (36.6 C) 98.3 F (36.8 C)  TempSrc: Oral Oral Oral Oral  Resp: 20 20 20 18   SpO2: 100% 96% 99% 97%   Weight change:   Intake/Output Summary (Last 24 hours) at 10/27/14 1119 Last data filed at 10/27/14 0911  Gross per 24 hour  Intake   1003 ml  Output      3 ml  Net   1000 ml   Physical Exam  Constitutional:  Thin female, curled in bed, NAD  HENT:  Head: Normocephalic and atraumatic.  Eyes: EOM are normal.  Cardiovascular: Normal rate, regular rhythm and normal heart sounds.   Pulmonary/Chest:  Poor inspiratory effort. No wheezes or crackles appreciated.  Abdominal: Soft. She exhibits no distension. There is no rebound and no guarding.  Diffusely TTP in all quadrants.  Musculoskeletal: She exhibits no edema.  Neurological: She is alert.  EOMI. CN VII intact and symmetric.  Tongue projects midline.  Spontaneous movement of all extremities.  Grip 4/5 on right and 5/5 on left.  Follows commands.  Unable to repeat sentences.    Lab Results: Basic Metabolic Panel:  Recent Labs Lab 10/26/14 1710 10/27/14 0550  NA 130* 138  K 4.0 3.5  CL 99* 106  CO2 20* 20*  GLUCOSE 86 76  BUN 8 5*  CREATININE 0.84 0.71  CALCIUM 8.7* 9.1  MG 1.7  --   PHOS 3.2  --    Liver Function Tests:  Recent Labs Lab 10/26/14 1710  AST 38  ALT 20  ALKPHOS 63  BILITOT 1.0  PROT 6.4*  ALBUMIN 3.4*   No results for input(s): LIPASE, AMYLASE in the last 168 hours. No results for input(s): AMMONIA in the last 168 hours. CBC:  Recent Labs Lab 10/26/14 1356 10/26/14 1413  WBC 9.4  --   HGB 13.9 15.0  HCT 39.6 44.0    MCV 90.2  --   PLT 301  --    Cardiac Enzymes: No results for input(s): CKTOTAL, CKMB, CKMBINDEX, TROPONINI in the last 168 hours. BNP: No results for input(s): PROBNP in the last 168 hours. D-Dimer: No results for input(s): DDIMER in the last 168 hours. CBG:  Recent Labs Lab 10/26/14 1353  GLUCAP 143*   Hemoglobin A1C: No results for input(s): HGBA1C in the last 168 hours. Fasting Lipid Panel: No results for input(s): CHOL, HDL, LDLCALC, TRIG, CHOLHDL, LDLDIRECT in the last 168 hours. Thyroid Function Tests:  Recent Labs Lab 10/26/14 1710  TSH 0.742   Coagulation: No results for input(s): LABPROT, INR in the last 168 hours. Anemia Panel:  Recent Labs Lab 10/26/14 1710  VITAMINB12 1459*   Urine Drug Screen: Drugs of Abuse  No results found for: LABOPIA, COCAINSCRNUR, LABBENZ, AMPHETMU, THCU, LABBARB  Alcohol Level: No results for input(s): ETH in the last 168 hours. Urinalysis:  Recent Labs Lab 10/26/14 1404  COLORURINE YELLOW  LABSPEC 1.015  PHURINE 5.5  GLUCOSEU NEGATIVE  HGBUR NEGATIVE  BILIRUBINUR MODERATE*  KETONESUR NEGATIVE  PROTEINUR 100*  UROBILINOGEN 1.0  NITRITE NEGATIVE  LEUKOCYTESUR NEGATIVE   Misc. Labs:   Micro Results: No results found for this  or any previous visit (from the past 240 hour(s)). Studies/Results: Ct Head Wo Contrast  10/26/2014  CLINICAL DATA:  Patient has seizure lasting for 10-15 minutes. Witnessed seizure. Patient had uncontrolled movements during scan which limits evaluation. Repeat scan was performed. EXAM: CT HEAD WITHOUT CONTRAST TECHNIQUE: Contiguous axial images were obtained from the base of the skull through the vertex without intravenous contrast. COMPARISON:  None. FINDINGS: Repeat imaging improved head motion. No acute intracranial hemorrhage. No focal mass lesion. No CT evidence of acute infarction. No midline shift or mass effect. No hydrocephalus. Basilar cisterns are patent. Focal hypodensity in the  in the LEFT external capsule, LEFT insular ribbon and centrum semiovale. There is mild ventricular dilatation on the LEFT associated with volume loss. Paranasal sinuses and  mastoid air cells are clear. IMPRESSION: 1. No acute intracranial findings. 2. Chronic infarction in the deep white matter and insular ribbon on the LEFT with mild ex vacuo dilatation of the lateral ventricle. Electronically Signed   By: Suzy Bouchard M.D.   On: 10/26/2014 14:34   Dg Chest Port 1 View  10/26/2014  CLINICAL DATA:  Seizure.  Rightward gaze.  Cough and congestion. EXAM: PORTABLE CHEST 1 VIEW COMPARISON:  None. FINDINGS: Right rotated chest radiograph. Top-normal heart size. Mildly tortuous thoracic aorta. Otherwise normal mediastinal contour. No pneumothorax. No pleural effusion. There is patchy opacity at the right greater than left lung bases. No pulmonary edema. IMPRESSION: Patchy opacity at the right greater than left lung bases, suspicious for pneumonia and/or aspiration. Electronically Signed   By: Ilona Sorrel M.D.   On: 10/26/2014 13:58   Medications: I have reviewed the patient's current medications. Scheduled Meds: . ampicillin-sulbactam (UNASYN) IV  3 g Intravenous Q8H  . aspirin  81 mg Oral Daily  . atorvastatin  20 mg Oral q1800  . enoxaparin (LOVENOX) injection  40 mg Subcutaneous Q24H  . levETIRAcetam  500 mg Oral BID  . pantoprazole  40 mg Oral Daily  . sodium chloride  3 mL Intravenous Q12H   Continuous Infusions:  PRN Meds:.acetaminophen **OR** acetaminophen, albuterol, LORazepam Assessment/Plan: Principal Problem:   Generalized seizures (H[facility]) Active Problems:   Essential hypertension   GERD (gastroesophageal reflux disease)   CVA (cerebral vascular accident) ([place])   Hyperlipidemia   Hepatitis C antibody test positive  Tina Sanders is a 59 yo female with HTN, GERD, h/o seizure d/o, and h/o CVA with residual right sided weakness, presenting with seizure.  Tonic-Clonic Seizure:  Duration of seizure unknown, but at least 10-15 minutes and requiring Versed to terminate. Patient remains post-ictal approximately 4 hours after the seizure terminated. She has been off anti-epileptics (Keppra) for at least one month. Patient has a h/o seizure, though the underlying etiology or usual manifestation of her seizure is unknown. Current seizure episode likely due to medication nonadherence, as family has been unable to obtain her medications. Although CT head was negative for acute bleed or stroke, patient remains aphasic. It is possible her aphasia is still her post-ictal phase vs residual effects of Versed vs continued seizure vs stroke.  She is currently following commands, so there is concern for acute stroke precipitating the acute seizure. Patient has also not been on ASA or statin.  [name] also reports a history of sickle cell disease, which would greatly increase her risk of stroke.  Will continue with MRI with sedation if necessary. [ ]  MRI brain - ASA 81 daily - Atorva 20 PO - HOLD Lisinopril-HCTZ in setting of possible stroke -  Keppra 1000 mg IV load and 500 mg BID - [place] - SLP: dysphagia 1 [ ]  PT/OT [ ]  Hgb electrophoresis  Aspiration Pneumonia: Concern for aspiration PNA given patient's hypoxia on arrival to ED and CXR findings, requiring nonrebreather. Patient oxygen saturation eventually improved to 99% on 2L [place].  - Unasyn overnight and possible switch to PO  HCV: Antibody elevated.  No known h/o HCV. [ ]  HCV RNA quant [ ]  HCV genotype  H/o CVA: Sister believes CVA 1-2 years ago, with right sided weakness improving. At baseline, patient able to ambulate and fluently converse. Although patient records from her previous PCP are still pending, it does not appear she was on ASA therapy. Current concern for new stroke. - ASA [ ]  MRI brain  GERD: Protonix PO HTN: Hold Lisinopril-HCTZ  FEN/GI: - Dysphagia 1 - Protonix IV  DVT Ppx:  Lovenox  Dispo: Disposition is deferred at this time, awaiting improvement of current medical problems.  Anticipated discharge in approximately 1-2 day(s).   The patient does have a current PCP ([facility], [facility]) and does need an [facility] hospital follow-up appointment after discharge.  The patient does not have transportation limitations that hinder transportation to clinic appointments.  .Services Needed at time of discharge: Y = Yes, Blank = No PT:   OT:   RN:   Equipment:   Other:     LOS: 1 day   Iline Oven, MD 10/27/2014, 11:19 AM

## 2014-10-27 NOTE — Progress Notes (Signed)
Pt came down for scan after being given ativan prior for scan (this was the second attempt).  Pt was somnolent in bed but the minute we went to move her to the table she came awake and didn't want [place] to touch her, would not follow commands at all and then became very combative, strking out at employees when we tried to move her back to the bed.  They did not want to sedate her further.  When she went back over the bed again she fell asleep but any stimulation she becomes combative.  Pt sent back up to room with 2 transporters for safety reasons.

## 2014-10-27 NOTE — Procedures (Signed)
EEG report.  Brief clinical history: 59 year old woman with known seizure disorder and prior CVA with right-sided residual deficits came to the emergency department because of an acute seizure lasting around 15 minutes. Patient was very drowsy when the team admitted her, following commands, but difficulty with speaking, maybe either post-ictal or benzo effect. This morning she is more alert and awake, but has expressive aphasia which is new.   Technique: this is a 17 channel routine scalp EEG performed at the bedside with bipolar and monopolar montages arranged in accordance to the international 10/20 system of electrode placement. One channel was dedicated to EKG recording.  No sleep was recorded. No activating procedures performed.  Description:In the wakeful state, the best background consisted of a medium amplitude, posterior dominant, well sustained, symmetric and reactive 8 Hz rhythm. There is notable background disorganization over the left hemisphere, with intermixed polymorphic sharply contoured theta and concurrent higher frequencies in the alpha range, as well as frequent sharp waves overt the T3, T5 electrodes. The sharp waves often occurs in a repetitive fashion but there is not frank evolution of these discharges which remain localized to the left temporal region. Although the above described left hemispheric pattern over the left hemisphere briefly becomes semi rhythmic, there is not definitive evidence of electrographic seizures.  EKG showed sinus rhythm.  Impression: this is an abnormal EEG due to a disorganized background pattern involving the left hemisphere with intermixed repetitive sharp waves over the left temporal region (T3,T5). 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 No electrographic seizures noted.  Clinical correlation is advised.   Dorian Pod, MD [facility]

## 2014-10-27 NOTE — Clinical Documentation Improvement (Signed)
Internal Medicine  Based on the clinical findings below, please document any associated diagnoses/conditions the patient has or may have in the progress notes.   Hyponatremia  Hypernatremia  Other  Clinically Undetermined  Supporting Information: Component     Latest Ref Rng 10/26/2014         5:10 PM  Glucose     65 - 99 mg/dL 86  BUN     6 - 20 mg/dL 8  Creatinine     0.44 - 1.00 mg/dL 0.84  EGFR (Non-African Amer.)     >60 mL/min >60  EGFR (African American)     >60 mL/min >60  Sodium     135 - 145 mmol/L 130 (L)    Please exercise your independent, professional judgment when responding. A specific answer is not anticipated or expected.  Thank You, Alessandra Grout, RN, BSN, CCDS,Clinical Documentation Specialist:  [phone]  [phone]=[facility]- Health Information Management

## 2014-10-28 ENCOUNTER — Inpatient Hospital Stay (HOSPITAL_COMMUNITY): Payer: Medicare Other

## 2014-10-28 DIAGNOSIS — R5381 Other malaise: Secondary | ICD-10-CM

## 2014-10-28 DIAGNOSIS — R569 Unspecified convulsions: Secondary | ICD-10-CM

## 2014-10-28 DIAGNOSIS — I10 Essential (primary) hypertension: Secondary | ICD-10-CM

## 2014-10-28 LAB — CBC
HEMATOCRIT: 34.9 % — AB (ref 36.0–46.0)
HEMOGLOBIN: 12.3 g/dL (ref 12.0–15.0)
MCH: 32 pg (ref 26.0–34.0)
MCHC: 35.2 g/dL (ref 30.0–36.0)
MCV: 90.9 fL (ref 78.0–100.0)
Platelets: 250 10*3/uL (ref 150–400)
RBC: 3.84 MIL/uL — AB (ref 3.87–5.11)
RDW: 13.9 % (ref 11.5–15.5)
WBC: 6.5 10*3/uL (ref 4.0–10.5)

## 2014-10-28 LAB — DIFFERENTIAL
BASOS ABS: 0 10*3/uL (ref 0.0–0.1)
BASOS PCT: 0 %
Eosinophils Absolute: 0.1 10*3/uL (ref 0.0–0.7)
Eosinophils Relative: 1 %
LYMPHS ABS: 1.7 10*3/uL (ref 0.7–4.0)
Lymphocytes Relative: 27 %
MONOS PCT: 12 %
Monocytes Absolute: 0.8 10*3/uL (ref 0.1–1.0)
NEUTROS ABS: 3.8 10*3/uL (ref 1.7–7.7)
NEUTROS PCT: 60 %

## 2014-10-28 LAB — HCV RNA QUANT: HCV QUANT: NOT DETECTED [IU]/mL (ref >50)

## 2014-10-28 MED ORDER — ADULT MULTIVITAMIN W/MINERALS CH
1.0000 | ORAL_TABLET | Freq: Every day | ORAL | Status: DC
  Administered 2014-10-29 – 2014-11-01 (×4): 1 via ORAL
  Filled 2014-10-28 (×5): qty 1

## 2014-10-28 MED ORDER — LORAZEPAM 2 MG/ML IJ SOLN
1.0000 mg | Freq: Once | INTRAMUSCULAR | Status: AC
  Administered 2014-10-28: 1 mg via INTRAVENOUS
  Filled 2014-10-28: qty 1

## 2014-10-28 MED ORDER — SODIUM CHLORIDE 0.9 % IV SOLN
1000.0000 mg | Freq: Once | INTRAVENOUS | Status: AC
  Administered 2014-10-29: 1000 mg via INTRAVENOUS
  Filled 2014-10-28: qty 20

## 2014-10-28 MED ORDER — LORAZEPAM 2 MG/ML IJ SOLN
1.0000 mg | Freq: Once | INTRAMUSCULAR | Status: AC
  Administered 2014-10-28: 1 mg via INTRAVENOUS

## 2014-10-28 MED ORDER — THIAMINE HCL 100 MG/ML IJ SOLN: 200.0000 mg | Freq: Once | INTRAMUSCULAR | Status: DC

## 2014-10-28 MED ORDER — LORAZEPAM 2 MG/ML IJ SOLN
INTRAMUSCULAR | Status: AC
  Filled 2014-10-28: qty 1

## 2014-10-28 MED ORDER — LORAZEPAM 1 MG PO TABS
1.0000 mg | ORAL_TABLET | Freq: Once | ORAL | Status: DC
  Filled 2014-10-28: qty 1

## 2014-10-28 MED ORDER — THIAMINE HCL 100 MG/ML IJ SOLN
200.0000 mg | INTRAMUSCULAR | Status: AC
  Administered 2014-10-28 – 2014-10-30 (×3): 200 mg via INTRAVENOUS
  Filled 2014-10-28 (×3): qty 2

## 2014-10-28 NOTE — Progress Notes (Signed)
Speech Language Pathology Treatment: Dysphagia  Patient Details Name: Tina Sanders MRN: [id number] DOB: [birth year] Today's Date: 10/28/2014 Time: [id number] SLP Time Calculation (min) (ACUTE ONLY): 13 min  Assessment / Plan / Recommendation Clinical Impression  Pt seen for dysphagia tx.  Required assist with set-up of lunch tray.  Fed herself dysphagia 1, nectar-thick liquids with no s/s of aspiration.  Rate is rapid, bolus size large, requiring mod verbal and tactile cues for inhibition.  Presents with oral residue, anterior spilling and limited awareness.  Mod cues needed to attend to right face.  Improved ability to follow simple commands in contextual/predictable setting.  Continues with vocalizing, efforts to speak but no intelligible words identified.  Please order SLP eval for speech/language - we received swallow orders only.    HPI Other Pertinent Information: 59 yo female with HTN, GERD, h/o seizure d/o, and h/o CVA with residual right sided weakness, presenting with seizure. History is obtained from the patient's sister.  Pt had stroke 2013; was living with a brother in [place] who was caregiver and killed in [place], precipitating her move here to sister's residence. [place] able to ambulate and converse fluently. [place] three months ago sustaining fractured ribs with ongoing pain.  Recent assessments at OP neurorehab and was getting ready to begin therapies, including OT/PT/ST.     Pertinent Vitals Pain Assessment: Faces Faces Pain Scale: No hurt  SLP Plan  Continue with current plan of care    Recommendations Diet recommendations: Dysphagia 1 (puree);Nectar-thick liquid Liquids provided via: Cup Medication Administration: Whole meds with puree Supervision: Patient able to self feed;Full supervision/cueing for compensatory strategies Compensations: Slow rate;Small sips/bites Postural Changes and/or Swallow Maneuvers: Seated upright 90 degrees              Oral Care Recommendations:  Oral care BID Follow up Recommendations: Inpatient Rehab Plan: Continue with current plan of care    GO Swallow Current Status ([id number]): At least 40 percent but less than 60 percent impaired, limited or restricted Swallow Goal Status [phone]): At least 20 percent but less than 40 percent impaired, limited or restricted   Juan Quam Laurice 10/28/2014, 12:12 PM

## 2014-10-28 NOTE — Progress Notes (Signed)
Pt observed to be twitching on the right side of the face at 2230, pt still non verbal but follows simple command, Dr Marijean Bravo with [facility] paged and notified, came up to see pt, ordered iv ativan 1mg  same given at 2306, Dr Leonel Ramsay also called and notified,came up to see pt after the ativan dose at 2330 as pt was still twitching, said will order iv dilantin, pt reassured, will however continue to monitor. Ob[place]-Asidi, Kandie Keiper Efe

## 2014-10-28 NOTE — Progress Notes (Signed)
Subjective: Tina Sanders.  MRI was unable to be performed due to patient agitation.  Patient more alert than yesterday, sitting in bed eating breakfast.  She continues to exhibit expressive aphasia, and now appears to exhibit receptive aphasia.  She can mimic commands, but will not perform them with only verbal command.  Objective: Vital signs in last 24 hours: Filed Vitals:   10/27/14 1826 10/27/14 2128 10/28/14 0143 10/28/14 0502  BP: 124/77 136/77 130/79 135/71  Pulse: 85 84 86 85  Temp: 99.3 F (37.4 C) 98.7 F (37.1 C) 98.8 F (37.1 C) 98.5 F (36.9 C)  TempSrc: Oral Oral Oral Oral  Resp: 16 18 20 20   Height:      SpO2: 97% 97% 97% 95%   Weight change:   Intake/Output Summary (Last 24 hours) at 10/28/14 0917 Last data filed at 10/28/14 0655  Gross per 24 hour  Intake      0 ml  Output      3 ml  Net     -3 ml   Physical Exam  Constitutional:  Thin female, sitting in bed, NAD.  Attempting to communicate but aphasic.  HENT:  Head: Normocephalic and atraumatic.  Eyes: EOM are normal.  Cardiovascular: Normal rate, regular rhythm and normal heart sounds.   Pulmonary/Chest:  Poor inspiratory effort. No wheezes or crackles appreciated.  Abdominal: Soft. She exhibits no distension. There is no tenderness. There is no rebound and no guarding.  Musculoskeletal: She exhibits no edema.  Neurological: She is alert.  EOMI. CN VII intact and symmetric.  Tongue projects midline.  Spontaneous movement of all extremities.  Grip 4/5 on right and 5/5 on left.  Mimics commands.  Unable to repeat sentences or follow verbal commands without prompting.  Skin: Skin is warm and dry. No rash noted.    Lab Results: Basic Metabolic Panel:  Recent Labs Lab 10/26/14 1710 10/27/14 0550  NA 130* 138  K 4.0 3.5  CL 99* 106  CO2 20* 20*  GLUCOSE 86 76  BUN 8 5*  CREATININE 0.84 0.71  CALCIUM 8.7* 9.1  MG 1.7  --   PHOS 3.2  --    Liver Function Tests:  Recent Labs Lab 10/26/14 1710    AST 38  ALT 20  ALKPHOS 63  BILITOT 1.0  PROT 6.4*  ALBUMIN 3.4*   No results for input(s): LIPASE, AMYLASE in the last 168 hours. No results for input(s): AMMONIA in the last 168 hours. CBC:  Recent Labs Lab 10/26/14 1356 10/26/14 1413  WBC 9.4  --   HGB 13.9 15.0  HCT 39.6 44.0  MCV 90.2  --   PLT 301  --    Cardiac Enzymes: No results for input(s): CKTOTAL, CKMB, CKMBINDEX, TROPONINI in the last 168 hours. BNP: No results for input(s): PROBNP in the last 168 hours. D-Dimer: No results for input(s): DDIMER in the last 168 hours. CBG:  Recent Labs Lab 10/26/14 1353  GLUCAP 143*   Hemoglobin A1C:  Recent Labs Lab 10/26/14 1710  HGBA1C <4.0*   Fasting Lipid Panel: No results for input(s): CHOL, HDL, LDLCALC, TRIG, CHOLHDL, LDLDIRECT in the last 168 hours. Thyroid Function Tests:  Recent Labs Lab 10/26/14 1710  TSH 0.742   Coagulation: No results for input(s): LABPROT, INR in the last 168 hours. Anemia Panel:  Recent Labs Lab 10/26/14 1710  VITAMINB12 1459*   Urine Drug Screen: Drugs of Abuse     Component Value Date/Time   LABOPIA NONE DETECTED 10/27/2014 1225  COCAINSCRNUR NONE DETECTED 10/27/2014 1225   LABBENZ POSITIVE* 10/27/2014 1225   AMPHETMU NONE DETECTED 10/27/2014 1225   THCU NONE DETECTED 10/27/2014 1225   LABBARB NONE DETECTED 10/27/2014 1225    Alcohol Level: No results for input(s): ETH in the last 168 hours. Urinalysis:  Recent Labs Lab 10/26/14 1404  COLORURINE YELLOW  LABSPEC 1.015  PHURINE 5.5  GLUCOSEU NEGATIVE  HGBUR NEGATIVE  BILIRUBINUR MODERATE*  KETONESUR NEGATIVE  PROTEINUR 100*  UROBILINOGEN 1.0  NITRITE NEGATIVE  LEUKOCYTESUR NEGATIVE   Misc. Labs:   Micro Results: No results found for this or any previous visit (from the past 240 hour(s)). Studies/Results: Ct Head Wo Contrast  10/26/2014  CLINICAL DATA:  Patient has seizure lasting for 10-15 minutes. Witnessed seizure. Patient had  uncontrolled movements during scan which limits evaluation. Repeat scan was performed. EXAM: CT HEAD WITHOUT CONTRAST TECHNIQUE: Contiguous axial images were obtained from the base of the skull through the vertex without intravenous contrast. COMPARISON:  None. FINDINGS: Repeat imaging improved head motion. No acute intracranial hemorrhage. No focal mass lesion. No CT evidence of acute infarction. No midline shift or mass effect. No hydrocephalus. Basilar cisterns are patent. Focal hypodensity in the in the LEFT external capsule, LEFT insular ribbon and centrum semiovale. There is mild ventricular dilatation on the LEFT associated with volume loss. Paranasal sinuses and  mastoid air cells are clear. IMPRESSION: 1. No acute intracranial findings. 2. Chronic infarction in the deep white matter and insular ribbon on the LEFT with mild ex vacuo dilatation of the lateral ventricle. Electronically Signed   By: Suzy Bouchard M.D.   On: 10/26/2014 14:34   Dg Chest Port 1 View  10/26/2014  CLINICAL DATA:  Seizure.  Rightward gaze.  Cough and congestion. EXAM: PORTABLE CHEST 1 VIEW COMPARISON:  None. FINDINGS: Right rotated chest radiograph. Top-normal heart size. Mildly tortuous thoracic aorta. Otherwise normal mediastinal contour. No pneumothorax. No pleural effusion. There is patchy opacity at the right greater than left lung bases. No pulmonary edema. IMPRESSION: Patchy opacity at the right greater than left lung bases, suspicious for pneumonia and/or aspiration. Electronically Signed   By: Ilona Sorrel M.D.   On: 10/26/2014 13:58   Medications: I have reviewed the patient's current medications. Scheduled Meds: . aspirin  81 mg Oral Daily  . atorvastatin  20 mg Oral q1800  . enoxaparin (LOVENOX) injection  40 mg Subcutaneous Q24H  . levETIRAcetam  1,000 mg Intravenous Q12H  . pantoprazole  40 mg Oral Daily  . sodium chloride  3 mL Intravenous Q12H   Continuous Infusions:  PRN Meds:.acetaminophen **OR**  acetaminophen, albuterol Assessment/Plan: Principal Problem:   Generalized seizures (HCC) Active Problems:   Essential hypertension   GERD (gastroesophageal reflux disease)   CVA (cerebral vascular accident) ([place])   Hyperlipidemia   Hepatitis C antibody test positive   Seizure ([place])   Malnutrition of moderate degree  Ms. Raj is a 59 yo female with HTN, GERD, h/o seizure d/o, and h/o CVA with residual right sided weakness, presenting with seizure.  Tonic-Clonic Seizure: Duration of seizure unknown, but at least 10-15 minutes and requiring Versed to terminate. Patient remains post-ictal approximately 4 hours after the seizure terminated. She has been off anti-epileptics (Keppra) for at least one month. Patient has a h/o seizure, though the underlying etiology or usual manifestation of her seizure is unknown. Current seizure episode likely due to medication nonadherence, as family has been unable to obtain her medications. Although CT head was negative for acute bleed  or stroke, patient remains aphasic. It is possible her aphasia is still her post-ictal phase vs residual effects of Versed vs continued seizure vs stroke.  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  Neuro consult noted concern for continued focal seizure.  She was given another IV Keppra load and daily dose increased to 1000mg  IV BID.  She is currently mimicking commands, but is now demonstrating receptive and expressive aphasia. MRI has been unable to be obtained due to patient agitation.  Will proceed with repeat CT head for comparison.  [name] also reports a history of sickle cell disease, which would greatly increase her risk of stroke.   [ ]  CT brain - ASA 81 daily - Atorva 20 PO - HOLD Lisinopril-HCTZ in setting of possible stroke - Keppra 1000 mg IV load and 1000 mg IV BID - [place] - SLP: dysphagia 1 [ ]  RPR [ ]  PT/OT [ ]  Hgb  electrophoresis  Aspiration Pneumonitis: Concern for aspiration PNA given patient's hypoxia on arrival to ED and CXR findings, requiring nonrebreather. Patient has been weaned to room air without continued hypoxemia without SIRS.  No antibiotics necessary at this time as it is was likely pneumonitis.  HCV: Antibody elevated.  No known h/o HCV. [ ]  HCV RNA quant [ ]  HCV genotype  H/o CVA: Sister believes CVA 1-2 years ago, with right sided weakness improving. At baseline, patient able to ambulate and fluently converse. Although patient records from her previous PCP are still pending, it does not appear she was on ASA therapy. Current concern for new stroke. - ASA [ ]  CT brain  GERD: Protonix PO HTN: Hold Lisinopril-HCTZ  FEN/GI: - Dysphagia 1 - Protonix IV  DVT Ppx: Lovenox  Dispo: Disposition is deferred at this time, awaiting improvement of current medical problems.  Anticipated discharge in approximately 1-2 day(s).   The patient does have a current PCP ([facility], [facility]) and does need an [facility] hospital follow-up appointment after discharge.  The patient does not have transportation limitations that hinder transportation to clinic appointments.  .Services Needed at time of discharge: Y = Yes, Blank = No PT: CIR and 24 hour supervision  OT:   RN:   Equipment:   Other:     LOS: 2 days   Iline Oven, MD 10/28/2014, 9:17 AM

## 2014-10-28 NOTE — Progress Notes (Signed)
Rehab admissions - Please see rehab consult done today by Dr. Posey Pronto.  Patient does not have the medical necessity to justify an acute inpatient rehab admission.  Recommend pursuit of SNF placement.  Call me for questions.  #[phone]

## 2014-10-28 NOTE — Progress Notes (Signed)
Subjective: Tina Sanders was seen and examined this AM.  Initially, she was able to say hello to me when I walked in the room and seemed to acknowledge when I introduced myself.  When her sister arrived in the room a few minutes later her speech was very difficult to comprehend.  She is unable to tell me hx and her sister has had only telephone contact with her in the past 7 years until recently when she moved to [facility] after her husband/caretaker's death.  Her sister reports hx of seizure preceding the stroke (but not a child or young adult) but is unsure if she was on AED.  Keppra is listed as a home med but she has been unable obtain medications since moving to [place] due to insurance issues.  Her sister is unsure about seizure control and med compliance.  She says the stroke was about 1.5 years ago with residual right sided weakness.  Speech has been normal up until this admission.  Her sister witnessed tonic-clonic movements and possible loss of bowel control a few days ago leading to this admission.  Sister says the patient is eating and able to feed herself this admission, not choking.  She is also ambulating up and down the hallways and to restroom.  Objective: Vital signs in last 24 hours: Filed Vitals:   10/27/14 2128 10/28/14 0143 10/28/14 0502 10/28/14 0938  BP: 136/77 130/79 135/71 143/77  Pulse: 84 86 85 85  Temp: 98.7 F (37.1 C) 98.8 F (37.1 C) 98.5 F (36.9 C) 99.1 F (37.3 C)  TempSrc: Oral Oral Oral Oral  Resp: 18 20 20 20   Height:      SpO2: 97% 97% 95% 98%   Weight change:   Intake/Output Summary (Last 24 hours) at 10/28/14 1008 Last data filed at 10/28/14 0655  Gross per 24 hour  Intake      0 ml  Output      3 ml  Net     -3 ml   General: resting in bed HEENT: no scleral icterus Cardiac: RRR, no rubs, murmurs or gallops Pulm: clear to auscultation bilaterally, moving normal volumes of air Abd: soft, nontender, nondistended, BS present Ext: warm and well perfused,  no pedal edema  Neurologic Exam:   Mental Status: Alert, oriented, thought content appropriate.  Expressive aphasia. Able to follow 3 step commands without difficulty.  Cranial Nerves:   II: PERRL but sluggish.  III/IV/VI: Extraocular movements intact.  Pupils reactive bilaterally.  V/VII: Smile symmetric. Facial light touch sensation normal bilaterally.  VIII: Grossly intact.  IX/X: Uvula midline.   XI: Bilateral shoulder shrug normal.  XII: Midline tongue extension normal.  Motor:  5/5 UE bilaterally with normal tone and bulk; moving LE spontaneously but does not cooperate with strength testing of LE  Sensory:  Appears grossly intact as she is wincing when the phlebotomist draws her blood and when Babinski is performed  DTRs: 2+ and symmetric throughout  Plantars:  Downgoing bilaterally  Cerebellar: Deferred.    Lab Results: Basic Metabolic Panel:  Recent Labs Lab 10/26/14 1710 10/27/14 0550  NA 130* 138  K 4.0 3.5  CL 99* 106  CO2 20* 20*  GLUCOSE 86 76  BUN 8 5*  CREATININE 0.84 0.71  CALCIUM 8.7* 9.1  MG 1.7  --   PHOS 3.2  --    Liver Function Tests:  Recent Labs Lab 10/26/14 1710  AST 38  ALT 20  ALKPHOS 63  BILITOT 1.0  PROT 6.4*  ALBUMIN 3.4*   CBC:  Recent Labs Lab 10/26/14 1356 10/26/14 1413  WBC 9.4  --   HGB 13.9 15.0  HCT 39.6 44.0  MCV 90.2  --   PLT 301  --    CBG:  Recent Labs Lab 10/26/14 1353  GLUCAP 143*   Hemoglobin A1C:  Recent Labs Lab 10/26/14 1710  HGBA1C <4.0*   Thyroid Function Tests:  Recent Labs Lab 10/26/14 1710  TSH 0.742   Anemia Panel:  Recent Labs Lab 10/26/14 1710  VITAMINB12 1459*   Urine Drug Screen: Drugs of Abuse     Component Value Date/Time   LABOPIA NONE DETECTED 10/27/2014 1225   COCAINSCRNUR NONE DETECTED 10/27/2014 1225   LABBENZ POSITIVE* 10/27/2014 1225   AMPHETMU NONE DETECTED 10/27/2014 1225   THCU NONE DETECTED 10/27/2014 1225   LABBARB NONE DETECTED 10/27/2014 1225    Urinalysis:  Recent Labs Lab 10/26/14 1404  COLORURINE YELLOW  LABSPEC 1.015  PHURINE 5.5  GLUCOSEU NEGATIVE  HGBUR NEGATIVE  BILIRUBINUR MODERATE*  KETONESUR NEGATIVE  PROTEINUR 100*  UROBILINOGEN 1.0  NITRITE NEGATIVE  LEUKOCYTESUR NEGATIVE   Studies/Results: Ct Head Wo Contrast  10/26/2014  CLINICAL DATA:  Patient has seizure lasting for 10-15 minutes. Witnessed seizure. Patient had uncontrolled movements during scan which limits evaluation. Repeat scan was performed. EXAM: CT HEAD WITHOUT CONTRAST TECHNIQUE: Contiguous axial images were obtained from the base of the skull through the vertex without intravenous contrast. COMPARISON:  None. FINDINGS: Repeat imaging improved head motion. No acute intracranial hemorrhage. No focal mass lesion. No CT evidence of acute infarction. No midline shift or mass effect. No hydrocephalus. Basilar cisterns are patent. Focal hypodensity in the in the LEFT external capsule, LEFT insular ribbon and centrum semiovale. There is mild ventricular dilatation on the LEFT associated with volume loss. Paranasal sinuses and  mastoid air cells are clear. IMPRESSION: 1. No acute intracranial findings. 2. Chronic infarction in the deep white matter and insular ribbon on the LEFT with mild ex vacuo dilatation of the lateral ventricle. Electronically Signed   By: Suzy Bouchard M.D.   On: 10/26/2014 14:34   Dg Chest Port 1 View  10/26/2014  CLINICAL DATA:  Seizure.  Rightward gaze.  Cough and congestion. EXAM: PORTABLE CHEST 1 VIEW COMPARISON:  None. FINDINGS: Right rotated chest radiograph. Top-normal heart size. Mildly tortuous thoracic aorta. Otherwise normal mediastinal contour. No pneumothorax. No pleural effusion. There is patchy opacity at the right greater than left lung bases. No pulmonary edema. IMPRESSION: Patchy opacity at the right greater than left lung bases, suspicious for pneumonia and/or aspiration. Electronically Signed   By: Ilona Sorrel M.D.    On: 10/26/2014 13:58   10/27/14 EEG: Impression: this is an abnormal EEG due to a disorganized background pattern involving the left hemisphere with intermixed repetitive sharp waves over the left temporal region (T3,T5). 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 No electrographic seizures noted. Clinical correlation is advised.   Medications: I have reviewed the patient's current medications. Scheduled Meds: . aspirin  81 mg Oral Daily  . atorvastatin  20 mg Oral q1800  . enoxaparin (LOVENOX) injection  40 mg Subcutaneous Q24H  . levETIRAcetam  1,000 mg Intravenous Q12H  . pantoprazole  40 mg Oral Daily  . sodium chloride  3 mL Intravenous Q12H  . thiamine IV  200 mg Intravenous Q24H  Continuous Infusions: none  PRN Meds:.acetaminophen **OR** acetaminophen, albuterol   Assessment/Plan: 59 year old woman with PMH of CVA (with residual R sided weakness) and seizure here with recurrent seizure and new expressive aphasia.  Seizures:  Witnessed seizure prior to admit and yesterday during exam by PA.  Aphasia is likely post-ictal.  Patient uncooperative/agitated during attempt at MRI despite Ativan.  Repeat CT head ordered by primary to evaluate for acute CVA given new expressive aphasia.   - awaiting repeat CT head to r/o stroke - continue Keppra, prn Ativan, seizure precautions   LOS: 2 days   Francesca Oman, DO  [facility] PGY3 on Neurology Service 10/28/2014, 10:08 AM   I personally participated in this patient's evaluation and management along with PGY 3 Dr. Redmond Pulling, including formulating above clinical impression and management recommendations.  Rush Farmer M.D. [facility] [phone]

## 2014-10-28 NOTE — Progress Notes (Signed)
Physical Therapy Treatment Patient Details Name: Tina Sanders MRN: [id number] DOB: 10-12-1955 Today's Date: 10/28/2014    History of Present Illness Pt is a 59 y/o female with HTN, GERD, h/o seizure d/o, and CVA with residual right sided weakness, presenting with seizure.Pt had stroke 2013; was living with a brother in [place] who was caregiver and killed in [place], precipitating her move here to sister's residence. Baseline able to ambulate and converse fluently. [place] three months ago sustaining fractured ribs with ongoing pain.    PT Comments    Pt progressing towards physical therapy goals. Was able to perform gait training activity with min to mod assist for balance and sudden unsteadiness. Distracted throughout session, wanting to grab and hold onto her gowns, the gait belt, the IV pole, etc. Continue to feel that this patient is appropriate for post-acute rehab at the CIR level. Will continue to follow and progress as able per POC.   Follow Up Recommendations  CIR;Supervision/Assistance - 24 hour     Equipment Recommendations  None recommended by PT    Recommendations for Other Services Rehab consult     Precautions / Restrictions Precautions Precautions: Fall Precaution Comments: Residual R sided weakness from prior stroke Restrictions Weight Bearing Restrictions: No    Mobility  Bed Mobility Overal bed mobility: Needs Assistance Bed Mobility: Supine to Sit     Supine to sit: Min guard Sit to supine: Min guard   General bed mobility comments: Close guard for safety.   Transfers Overall transfer level: Needs assistance Equipment used: 1 person hand held assist Transfers: Sit to/from Stand Sit to Stand: Min assist;Mod assist         General transfer comment: Min assist to steady consistently, with occasional mod assist for sudden posterior LOB.   Ambulation/Gait Ambulation/Gait assistance: Min assist;Mod assist Ambulation Distance (Feet): 200 Feet Assistive  device: 1 person hand held assist Gait Pattern/deviations: Step-through pattern;Decreased stride length;Trunk flexed;Antalgic;Wide base of support Gait velocity: Decreased Gait velocity interpretation: Below normal speed for age/gender General Gait Details: Pt was able to ambulate in hall with min-mod assist for balance. Very antalgic gait pattern.   Stairs            Wheelchair Mobility    Modified Rankin (Stroke Patients Only) Modified Rankin (Stroke Patients Only) Pre-Morbid Rankin Score: No significant disability Modified Rankin: Moderately severe disability     Balance Overall balance assessment: Needs assistance Sitting-balance support: Feet supported;No upper extremity supported Sitting balance-Leahy Scale: Fair Sitting balance - Comments: min A for balance while pt donning socks due to posterior lean   Standing balance support: No upper extremity supported Standing balance-Leahy Scale: Poor Standing balance comment: assist required                    Cognition Arousal/Alertness: Awake/alert Behavior During Therapy: Restless;Impulsive Overall Cognitive Status: Impaired/Different from baseline Area of Impairment: Attention;Following commands;Safety/judgement;Awareness;Problem solving   Current Attention Level: Selective   Following Commands: Follows one step commands inconsistently;Follows one step commands with increased time Safety/Judgement: Decreased awareness of safety;Decreased awareness of deficits Awareness: Emergent Problem Solving: Slow processing;Decreased initiation;Difficulty sequencing;Requires verbal cues;Requires tactile cues      Exercises      General Comments        Pertinent Vitals/Pain Pain Assessment: Faces Faces Pain Scale: No hurt    Home Living                      Prior Function  PT Goals (current goals can now be found in the care plan section) Acute Rehab PT Goals Patient Stated Goal: Pt  unable to state goals PT Goal Formulation: Patient unable to participate in goal setting Time For Goal Achievement: 11/10/14 Potential to Achieve Goals: Good Progress towards PT goals: Progressing toward goals    Frequency  Min 3X/week    PT Plan Current plan remains appropriate    Co-evaluation PT/OT/SLP Co-Evaluation/Treatment: Yes           End of Session Equipment Utilized During Treatment: Gait belt Activity Tolerance: Patient tolerated treatment well Patient left: in chair;with call bell/phone within reach;with family/visitor present;with chair alarm set     Time: 9702-6378 PT Time Calculation (min) (ACUTE ONLY): 18 min  Charges:  $Gait Training: 8-22 mins                    G Codes:      Rolinda Roan 11-Nov-2014, 3:00 PM  Rolinda Roan, PT, DPT Acute Rehabilitation Services Pager: [phone]

## 2014-10-28 NOTE — Progress Notes (Addendum)
Occupational Therapy Treatment Patient Details Name: Tina Sanders MRN: [id number] DOB: 08/22/1955 Today's Date: 10/28/2014    History of present illness Pt is a 59 y/o female with HTN, GERD, h/o seizure d/o, and CVA with residual right sided weakness, presenting with seizure.Pt had stroke 2013; was living with a brother in [place] who was caregiver and killed in [place], precipitating her move here to sister's residence. Baseline able to ambulate and converse fluently. [place] three months ago sustaining fractured ribs with ongoing pain.   OT comments  Pt progressing towards acute OT goals. Focus of session was grooming tasks standing at sink, toilet transfer, and pericare/clothing management. Pt min to mod A for LB ADLs, functional transfers, and functional mobility. Visual cues provided. Pt able to take comb presented to her and comb hair. Perseveration noted during grooming tasks. When presented hand lotion with visual demonstration pt applied to hair. One LOB during toilet transfer requiring mod A, otherwise min A for transfers. Decreased safety awareness. OT to continue to follow. Pt would greatly benefit from SLP speech/language evaluation. D/c plan remains appropriate from OT standpoint.    Follow Up Recommendations  CIR    Equipment Recommendations  Other (comment) (defer to next venue)    Recommendations for Other Services Rehab consult    Precautions / Restrictions Precautions Precautions: Fall Precaution Comments: Residual R sided weakness from prior stroke Restrictions Weight Bearing Restrictions: No       Mobility Bed Mobility Overal bed mobility: Needs Assistance Bed Mobility: Supine to Sit;Sit to Supine     Supine to sit: Min guard Sit to supine: Min guard   General bed mobility comments: min guard for safety  Transfers Overall transfer level: Needs assistance Equipment used: None Transfers: Sit to/from Stand Sit to Stand: Min assist;Mod assist         General  transfer comment: mod A from BSC; min A from EOB    Balance Overall balance assessment: Needs assistance Sitting-balance support: No upper extremity supported;Feet supported Sitting balance-Leahy Scale: Fair Sitting balance - Comments: min A for balance while pt donning socks due to posterior lean   Standing balance support: No upper extremity supported;During functional activity Standing balance-Leahy Scale: Poor Standing balance comment: assist for balance                   ADL Overall ADL's : Needs assistance/impaired     Grooming: Moderate assistance;Wash/dry hands;Brushing hair;Cueing for safety;Cueing for compensatory techniques;Cueing for sequencing;Standing Grooming Details (indicate cue type and reason): Min A for balance during dynamic activities. Visual cues provided at times. Pt able to take comb presented to her and comb hair. Perseveration noted when presented lotion with visual demonstration pt applied to hair. After completing toilet transfer pt returned to sink and with setup and cueing washed hands with min A. Able to take paper towel presented to her and use to dry hands.                Lower Body Dressing Details (indicate cue type and reason): donned socks EOB with Min A for balance due to posterior lean Toilet Transfer: Ambulation;Moderate assistance;BSC Toilet Transfer Details (indicate cue type and reason): Mod for 1 LOB during transition to [facility]  Toileting- Clothing Manipulation and Hygiene: Moderate assistance;Sit to/from stand Toileting - Clothing Manipulation Details (indicate cue type and reason): Mod A for balance with pt performing pericare in partial stand. toilet paper presented to pt and simple verbal cue provided.      Functional mobility  during ADLs: Moderate assistance General ADL Comments: Pt completed grooming tasks, toilet transfer, and pericare as detailed above. Pt struggling to communicate but words are for the most part  unintelligable. Pt noted to verbalize "dizzy" after therapist asked if pt was dizzy. Unclear if pt is actually dizzy. Encouraged pt to focus on one word response. Pt with decreased safety awareness and impulsive.      Vision                     Perception     Praxis      Cognition   Behavior During Therapy: Restless;Impulsive Overall Cognitive Status: Difficult to assess                       Extremity/Trunk Assessment               Exercises     Shoulder Instructions       General Comments      Pertinent Vitals/ Pain       Pain Assessment: Faces Faces Pain Scale: No hurt  Home Living                                          Prior Functioning/Environment              Frequency Min 3X/week     Progress Toward Goals  OT Goals(current goals can now be found in the care plan section)  Progress towards OT goals: Progressing toward goals  Acute Rehab OT Goals Patient Stated Goal: not stated OT Goal Formulation: With patient/family Time For Goal Achievement: 11/10/14 Potential to Achieve Goals: Good ADL Goals Pt Will Perform Grooming: with modified independence;standing Pt Will Perform Upper Body Bathing: with set-up;sitting Pt Will Perform Lower Body Bathing: with supervision;sit to/from stand Pt Will Perform Upper Body Dressing: with set-up;sitting Pt Will Perform Lower Body Dressing: with supervision;sit to/from stand Pt Will Transfer to Toilet: with modified independence;ambulating Pt Will Perform Toileting - Clothing Manipulation and hygiene: with modified independence;sit to/from stand Additional ADL Goal #1: Pt will voice needs during simple ADL task with min cueing.  Additional ADL Goal #2: Pt will attend to simple ADL task for 5 minutes with min cueing.   Plan Discharge plan remains appropriate    Co-evaluation                 End of Session Equipment Utilized During Treatment: Gait belt   Activity  Tolerance Patient tolerated treatment well   Patient Left in chair;with call bell/phone within reach;with chair alarm set;with family/visitor present   Nurse Communication          Time: 2947-6546 OT Time Calculation (min): 29 min  Charges: OT General Charges $OT Visit: 1 Procedure OT Treatments $Self Care/Home Management : 23-37 mins  Hortencia Pilar 10/28/2014, 2:49 PM

## 2014-10-28 NOTE — Consult Note (Addendum)
Physical Medicine and Rehabilitation Consult Reason for Consult: Breakthrough seizure disorder with history of CVA Referring Physician: Internal medicine   HPI: Tina Sanders is a 59 y.o. 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 By report patient was living with her brother in [place] who was a caregiver for her and was later killed in a motor vehicle accident precipitating her to move to her sister's residence in [place]. One level apartment with 15 steps to entry. At baseline patient able to ambulate and converse, per sister. Pt was scheduled to have SLP, but never got a chance to attend a session.  She did have a recent fall approximately 3 months ago sustaining rib fractures. Presented 10/26/2014 with witnessed seizure by her sister lasting approximately 10-15 minutes. By report patient has not been taking her Keppra for the last month. 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 She was loaded with Keppra. Cranial CT scan negative for acute abnormalities. Chronic infarction in the deep white matter and insular ribbon on the left. MRI of the brain pending as patient was initially unable to cooperate. Subcutaneous Lovenox for DVT prophylaxis. Dysphagia #1 nectar thick liquid diet. Physical therapy evaluation completed 10/27/2014 with recommendations of physical medicine rehabilitation consult   Review of Systems  Unable to perform ROS: other  Aphasia  Past Medical History  Diagnosis Date  . GERD (gastroesophageal reflux disease)   . Seizures ([place])   . Hypertension   . B12 deficiency   . Vitamin D deficiency disease   . Stroke [facility])     residual right sided deficits, expressive dysphasia   History reviewed. No pertinent  past surgical history. Family History  Problem Relation Age of Onset  . Hypertension Mother   . Hypertension Father   . Kidney disease Father   . Hyperlipidemia Father   . Hypertension Sister   . Diabetes Sister    Social History:  reports that she has been smoking Cigarettes.  She has a 10 pack-year smoking history. She does not have any smokeless tobacco history on file. She reports that she drinks about 8.4 oz of alcohol per week. Her drug history is not on file. Allergies:  Allergies  Allergen Reactions  . Penicillins Other (See Comments)    abdominal bloating Has patient had a PCN reaction causing immediate rash, facial/tongue/throat swelling, SOB or lightheadedness with hypotension: No Has patient had a PCN reaction causing severe rash involving mucus membranes or skin necrosis: No Has patient had a PCN reaction that required hospitalization No Has patient had a PCN reaction occurring within the last 10 years: No If all of the above answers are "NO", then may proceed with Cephalosporin use.    Medications Prior to Admission  Medication Sig Dispense Refill  . albuterol (PROVENTIL) (5 MG/ML) 0.5% nebulizer solution Take 0.5 mLs (2.5 mg total) by nebulization every 6 (six) hours as needed for wheezing or shortness of breath. 20 mL 12  . atorvastatin (LIPITOR) 20 MG tablet Take 1 tablet (20 mg total) by mouth daily at 6 PM. 30 tablet 2  . ergocalciferol (VITAMIN D2) 50000 UNITS capsule Take 1 capsule (50,000 Units total) by mouth once a week. 12 capsule 0  . levETIRAcetam (KEPPRA) 500 MG tablet Take 1 tablet (500 mg total) by mouth 2 (  two) times daily. 60 tablet 2  . Linaclotide (LINZESS) 145 MCG CAPS capsule Take 1 capsule (145 mcg total) by mouth daily. 30 capsule 2  . lisinopril-hydrochlorothiazide (ZESTORETIC) 10-12.5 MG tablet Take 1 tablet by mouth daily. 30 tablet 2  . megestrol (MEGACE) 400 MG/10ML suspension Take 5 mLs (200 mg total) by mouth 3 (three) times daily with  meals. 240 mL 2  . omeprazole (PRILOSEC) 20 MG capsule Take 1 capsule (20 mg total) by mouth daily. 30 capsule 2  . potassium chloride (K-DUR) 10 MEQ tablet Take 2 tablets (20 mEq total) by mouth daily. 60 tablet 2  . [DISCONTINUED] Cyanocobalamin (B-12) 2500 MCG SUBL Place 2,500 mcg under the tongue daily. 30 tablet 2    Home: Home Living Family/patient expects to be discharged to:: Private residence Living Arrangements: Other relatives Available Help at Discharge: Family, Available 24 hours/day Type of Home: Apartment Home Access: Stairs to enter [employer] of Steps: [place]: One level Bathroom Shower/Tub: Research officer, trade [employer] Accessibility: Yes Home Equipment: Grab bars - tub/shower  Functional History: Prior Function Level of Independence: Independent Comments: Sponge bathing at sink due to rib pain s/p fractures. Functional Status:  Mobility: Bed Mobility Overal bed mobility: Needs Assistance Bed Mobility: Supine to Sit Supine to sit: Min guard General bed mobility comments: min guard for safety Transfers Overall transfer level: Needs assistance Equipment used: 1 person hand held assist, None Transfers: Sit to/from Stand Sit to Stand: Min assist General transfer comment: Min-mod A for balance from EOB.   Ambulation/Gait Ambulation/Gait assistance: Min assist, Mod assist Ambulation Distance (Feet): 150 Feet Assistive device: 1 person hand held assist Gait Pattern/deviations: Step-through pattern, Decreased stride length, Wide base of support, Antalgic General Gait Details: Pt was able to ambulate in hall with min-mod assist for balance. Very antalgic gait pattern. Gait velocity: Decreased Gait velocity interpretation: Below normal speed for age/gender    ADL: ADL Overall ADL's : Needs assistance/impaired Eating/Feeding: Minimal assistance, Sitting Grooming: Maximal assistance, Standing Grooming Details (indicate cue type and reason):  ideational apraxia noted; pt using comb to brush chin. mouth, and eyes. Min-mod A for balance in standing. Redirection needed for safety with pt touching comb to eyes.  Upper Body Bathing: Sitting, Maximal assistance Lower Body Bathing: Maximal assistance, Sit to/from stand Upper Body Dressing : Maximal assistance, Sitting Lower Body Dressing: Maximal assistance, Sit to/from stand Toilet Transfer: Ambulation, Moderate assistance, BSC, RW Toileting- Clothing Manipulation and Hygiene: Maximal assistance, Sit to/from stand Tub/Shower Transfer Details (indicate cue type and reason): Pt has been sponge bathing recently due to rib pain from previous fall.  Functional mobility during ADLs: Moderate assistance General ADL Comments: Pt presents with decreased balance, decreased cognition, decreased safety awareness, impulsivity, impaired communication, and ideational apraixa impacting level of assist with ADLs. Pt's sisters present for session and report cogntion, balance, and communication are not at pt's baseline.   Cognition: Cognition Overall Cognitive Status: Difficult to assess Orientation Level: Other (comment) (incomprehensible ) Cognition Arousal/Alertness: Awake/alert Behavior During Therapy: Restless, Impulsive Overall Cognitive Status: Difficult to assess Area of Impairment: Attention, Following commands, Safety/judgement, Awareness, Problem solving Current Attention Level: Selective Following Commands: Follows one step commands inconsistently, Follows one step commands with increased time Safety/Judgement: Decreased awareness of safety, Decreased awareness of deficits Awareness: Emergent Problem Solving: Slow processing, Decreased initiation, Difficulty sequencing, Requires verbal cues, Requires tactile cues Difficult to assess due to: Impaired communication  Blood pressure 143/77, pulse 85, temperature 99.1 F (37.3 C), temperature source Oral, resp.  rate 20, height 5\' 3"  (1.6 m),  SpO2 98 %. Physical Exam  Vitals reviewed. Constitutional: She appears well-developed and well-nourished.  59 year old frail African-American female  HENT:  Head: Normocephalic and atraumatic.  Eyes: Conjunctivae and EOM are normal.  Pupils round and reactive to light without nystagmus  Neck: Normal range of motion. Neck supple. No thyromegaly present.  Cardiovascular: Normal rate and regular rhythm.   Respiratory: Effort normal and breath sounds normal. No respiratory distress.  GI: Soft. Bowel sounds are normal. She exhibits no distension.  Musculoskeletal: She exhibits no edema or tenderness.  Unable to assess strength due to aphasia PROM WNL B/l UE >/ 4/5  Neurological: She is alert. She has normal reflexes.  Lethargic but arousable. Patient is aphasic and did not follow commands.  Skin: Skin is warm and dry.    Results for orders placed or performed during the hospital encounter of 10/26/14 (from the past 24 hour(s))  Differential     Status: None   Collection Time: 10/28/14 10:52 AM  Result Value Ref Range   Neutrophils Relative % 60 %   Neutro Abs 3.8 1.7 - 7.7 K/uL   Lymphocytes Relative 27 %   Lymphs Abs 1.7 0.7 - 4.0 K/uL   Monocytes Relative 12 %   Monocytes Absolute 0.8 0.1 - 1.0 K/uL   Eosinophils Relative 1 %   Eosinophils Absolute 0.1 0.0 - 0.7 K/uL   Basophils Relative 0 %   Basophils Absolute 0.0 0.0 - 0.1 K/uL  CBC     Status: Abnormal   Collection Time: 10/28/14 10:52 AM  Result Value Ref Range   WBC 6.5 4.0 - 10.5 K/uL   RBC 3.84 (L) 3.87 - 5.11 MIL/uL   Hemoglobin 12.3 12.0 - 15.0 g/dL   HCT 34.9 (L) 36.0 - 46.0 %   MCV 90.9 78.0 - 100.0 fL   MCH 32.0 26.0 - 34.0 pg   MCHC 35.2 30.0 - 36.0 g/dL   RDW 13.9 11.5 - 15.5 %   Platelets 250 150 - 400 K/uL   Ct Head Wo Contrast  10/26/2014  CLINICAL DATA:  Patient has seizure lasting for 10-15 minutes. Witnessed seizure. Patient had uncontrolled movements during scan which limits evaluation. Repeat  scan was performed. EXAM: CT HEAD WITHOUT CONTRAST TECHNIQUE: Contiguous axial images were obtained from the base of the skull through the vertex without intravenous contrast. COMPARISON:  None. FINDINGS: Repeat imaging improved head motion. No acute intracranial hemorrhage. No focal mass lesion. No CT evidence of acute infarction. No midline shift or mass effect. No hydrocephalus. Basilar cisterns are patent. Focal hypodensity in the in the LEFT external capsule, LEFT insular ribbon and centrum semiovale. There is mild ventricular dilatation on the LEFT associated with volume loss. Paranasal sinuses and  mastoid air cells are clear. IMPRESSION: 1. No acute intracranial findings. 2. Chronic infarction in the deep white matter and insular ribbon on the LEFT with mild ex vacuo dilatation of the lateral ventricle. Electronically Signed   By: Suzy Bouchard M.D.   On: 10/26/2014 14:34   Dg Chest Port 1 View  10/26/2014  CLINICAL DATA:  Seizure.  Rightward gaze.  Cough and congestion. EXAM: PORTABLE CHEST 1 VIEW COMPARISON:  None. FINDINGS: Right rotated chest radiograph. Top-normal heart size. Mildly tortuous thoracic aorta. Otherwise normal mediastinal contour. No pneumothorax. No pleural effusion. There is patchy opacity at the right greater than left lung bases. No pulmonary edema. IMPRESSION: Patchy opacity at the right greater than left lung bases, suspicious  for pneumonia and/or aspiration. Electronically Signed   By: Ilona Sorrel M.D.   On: 10/26/2014 13:58    Assessment/Plan: Diagnosis: Other neurological/seizures. 1. Does the need for close, 24 hr/day medical supervision in concert with the patient's rehab needs make it unreasonable for this patient to be served in a less intensive setting? Potentially 2. Co-Morbidities requiring supervision/potential complications: Seizures, HTN 3. Due to safety, medication administration and patient education, does the patient require 24 hr/day rehab nursing?  Potentially 4. Does the patient require coordinated care of a physician, rehab nurse, PT (1-2 hrs/day, 5 days/week), OT (1-2 hrs/day, 5 days/week) and SLP (1-2 hrs/day, 5 days/week) to address physical and functional deficits in the context of the above medical diagnosis(es)? Potentially Addressing deficits in the following areas: balance, endurance, locomotion, strength, transferring, bathing, dressing, feeding, grooming, toileting, cognition, speech, language, swallowing and psychosocial support 5. Can the patient actively participate in an intensive therapy program of at least 3 hrs of therapy per day at least 5 days per week? Yes 6. The potential for patient to make measurable gains while on inpatient rehab is good 7. Anticipated functional outcomes upon discharge from inpatient rehab are supervision and min assist  with PT, modified independent and supervision with OT, min assist with SLP. 8. Estimated rehab length of stay to reach the above functional goals is: 10-12 days. 9. Does the patient have adequate social supports and living environment to accommodate these discharge functional goals? Yes 10. Anticipated D/C setting: Home 11. Anticipated post D/C treatments: HH therapy and Home excercise program 12. Overall Rehab/Functional Prognosis: good and fair  RECOMMENDATIONS: This patient's condition is appropriate for continued rehabilitative care in the following setting: SNF Patient has agreed to participate in recommended program. Potentially Note that insurance prior authorization may be required for reimbursement for recommended care.  Comment: Rehab Admissions Coordinator to follow up. Pt does not have a medical need that would justify a IRF stay.  Also believe pt has the potential to make considerable physical gains in a short period of time.  However, will cont to follow pt, in the event one should develop and the pt ceases to make gains.    Delice Lesch, MD  10/28/2014

## 2014-10-29 ENCOUNTER — Inpatient Hospital Stay (HOSPITAL_COMMUNITY)
Admit: 2014-10-29 | Discharge: 2014-10-29 | Disposition: A | Discharge status: Home / Self Care | Payer: Medicare Other | Attending: Student in an Organized Health Care Education/Training Program | Admitting: Student in an Organized Health Care Education/Training Program

## 2014-10-29 DIAGNOSIS — R4701 Aphasia: Secondary | ICD-10-CM

## 2014-10-29 LAB — TECHNOLOGIST SMEAR REVIEW

## 2014-10-29 LAB — HEMOGLOBINOPATHY EVALUATION
HGB C: 0 %
HGB F QUANT: 31 % — AB (ref 0.0–2.0)
HGB S QUANTITAION: 65.5 % — AB
Hgb A2 Quant: 3.5 % — ABNORMAL HIGH (ref 0.7–3.1)
Hgb A: 0 % — ABNORMAL LOW (ref 94.0–98.0)

## 2014-10-29 LAB — URINALYSIS, ROUTINE W REFLEX MICROSCOPIC
Bilirubin Urine: NEGATIVE
Glucose, UA: NEGATIVE mg/dL
Hgb urine dipstick: NEGATIVE
Ketones, ur: NEGATIVE mg/dL
LEUKOCYTES UA: NEGATIVE
NITRITE: NEGATIVE
PROTEIN: NEGATIVE mg/dL
Specific Gravity, Urine: 1.013 (ref 1.005–1.030)
UROBILINOGEN UA: 1 mg/dL (ref 0.0–1.0)
pH: 7.5 (ref 5.0–8.0)

## 2014-10-29 LAB — ALBUMIN: Albumin: 3.4 g/dL — ABNORMAL LOW (ref 3.5–5.0)

## 2014-10-29 LAB — BASIC METABOLIC PANEL
ANION GAP: 7 (ref 5–15)
CO2: 23 mmol/L (ref 22–32)
Calcium: 9 mg/dL (ref 8.9–10.3)
Chloride: 110 mmol/L (ref 101–111)
Creatinine, Ser: 0.7 mg/dL (ref 0.44–1.00)
Glucose, Bld: 111 mg/dL — ABNORMAL HIGH (ref 65–99)
POTASSIUM: 3.4 mmol/L — AB (ref 3.5–5.1)
SODIUM: 140 mmol/L (ref 135–145)

## 2014-10-29 LAB — RPR: RPR Ser Ql: NONREACTIVE

## 2014-10-29 LAB — PHENYTOIN LEVEL, TOTAL: Phenytoin Lvl: 19.6 ug/mL (ref 10.0–20.0)

## 2014-10-29 MED ORDER — SODIUM CHLORIDE 0.9 % IV SOLN
100.0000 mg | Freq: Two times a day (BID) | INTRAVENOUS | Status: DC
  Administered 2014-10-30: 100 mg via INTRAVENOUS
  Filled 2014-10-29 (×3): qty 10

## 2014-10-29 MED ORDER — PHENYTOIN SODIUM 50 MG/ML IJ SOLN
100.0000 mg | Freq: Three times a day (TID) | INTRAMUSCULAR | Status: DC
  Administered 2014-10-29: 100 mg via INTRAVENOUS
  Filled 2014-10-29 (×4): qty 2

## 2014-10-29 MED ORDER — LORAZEPAM 2 MG/ML IJ SOLN
1.0000 mg | Freq: Once | INTRAMUSCULAR | Status: AC
  Administered 2014-10-29: 1 mg via INTRAVENOUS

## 2014-10-29 MED ORDER — LORAZEPAM 2 MG/ML IJ SOLN
1.0000 mg | Freq: Once | INTRAMUSCULAR | Status: AC
  Administered 2014-10-29: 1 mg via INTRAVENOUS
  Filled 2014-10-29: qty 1

## 2014-10-29 MED ORDER — LORAZEPAM 2 MG/ML IJ SOLN
INTRAMUSCULAR | Status: AC
  Filled 2014-10-29: qty 1

## 2014-10-29 MED ORDER — PHENYTOIN SODIUM 50 MG/ML IJ SOLN
100.0000 mg | Freq: Three times a day (TID) | INTRAMUSCULAR | Status: DC
  Administered 2014-10-29 – 2014-10-31 (×6): 100 mg via INTRAVENOUS
  Filled 2014-10-29 (×12): qty 2

## 2014-10-29 MED ORDER — SODIUM CHLORIDE 0.9 % IV SOLN
200.0000 mg | INTRAVENOUS | Status: AC
  Administered 2014-10-29: 200 mg via INTRAVENOUS
  Filled 2014-10-29: qty 20

## 2014-10-29 NOTE — Progress Notes (Signed)
Speech Language Pathology Treatment: Dysphagia  Patient Details Name: Tina Sanders MRN: [id number] DOB: [birth year] Today's Date: 10/29/2014 Time: 8828-0034 SLP Time Calculation (min) (ACUTE ONLY): 16 min  Assessment / Plan / Recommendation Clinical Impression  Pt seen during lunch meal, with tray of pureed solids and nectar thick liquids. No overt signs of aspiration observed, although pt needs Mod cues for appropriate pacing to facilitate oral clearance and reduce anterior loss. Pt remains at risk due to cognitive-linguistic status and reduced oral control. Will continue to follow.   HPI Other Pertinent Information: 59 yo female with HTN, GERD, h/o seizure d/o, and h/o CVA with residual right sided weakness, presenting with seizure. History is obtained from the patient's sister.  Pt had stroke 2013; was living with a brother in [place] who was caregiver and killed in [place], precipitating her move here to sister's residence. [place] able to ambulate and converse fluently. [place] three months ago sustaining fractured ribs with ongoing pain.  Recent assessments at OP neurorehab and was getting ready to begin therapies, including OT/PT/ST.     Pertinent Vitals Pain Assessment: Faces Faces Pain Scale: No hurt  SLP Plan  Continue with current plan of care    Recommendations Diet recommendations: Dysphagia 1 (puree);Nectar-thick liquid Liquids provided via: Cup Medication Administration: Whole meds with puree Supervision: Patient able to self feed;Full supervision/cueing for compensatory strategies Compensations: Slow rate;Small sips/bites Postural Changes and/or Swallow Maneuvers: Seated upright 90 degrees       Oral Care Recommendations: Oral care BID Follow up Recommendations: Inpatient Rehab Plan: Continue with current plan of care    Germain Osgood, M.A. CCC-SLP [phone]  Germain Osgood 10/29/2014, 3:25 PM

## 2014-10-29 NOTE — Procedures (Signed)
EEG report.  Brief clinical history: 59 year old woman with known seizure disorder and prior CVA with right-sided residual deficits came to the emergency department because of an acute seizure lasting around 15 minutes. Patient was very drowsy when the team admitted her, following commands, but difficulty with speaking, maybe either post-ictal or benzo effect. This morning she is more alert and awake, but has expressive aphasia which is new.  Technique: this is a 17 channel routine scalp EEG performed at the bedside with bipolar and monopolar montages arranged in accordance to the international 10/20 system of electrode placement. One channel was dedicated to EKG recording.  No sleep was recorded. No activating procedures performed.  Description:In the wakeful state, the best background consisted of a medium amplitude, posterior dominant, well sustained, symmetric and reactive 8 Hz rhythm. There is notable background disorganization over the left hemisphere, with intermixed polymorphic sharply contoured theta and concurrent higher frequencies in the alpha range, as well as frequent sharp waves over the T3, T5 electrodes which not infrequently  occur in a repetitive fashion. Additionally, several train of repetitive and worrisome spike and polyspike and wave discharges at higher frequencies over the left hemisphere were noted, at times preceding and accompanying patient right facial twitching.    EKG showed sinus rhythm.  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  A more concerning pattern of repetitive and worrisome trains of spike and polyspike and wave discharges at higher frequencies over the left  hemisphere were noted, at times preceding and accompanying patient right facial twitching which in the appropriate clinical scenario could be indicative of electrographic seizures. Clinical correlation is advised.  Dorian Pod, MD [facility]

## 2014-10-29 NOTE — Clinical Social Work Placement (Signed)
   CLINICAL SOCIAL WORK PLACEMENT  NOTE  Date:  10/29/2014  Patient Details  Name: Agusta Hackenberg MRN: [id number] Date of Birth: 07/25/1955  Clinical Social Work is seeking post-discharge placement for this patient at the [place] level of care (*CSW will initial, date and re-position this form in  chart as items are completed):  Yes   Patient/family provided with [facility] Work Department's list of facilities offering this level of care within the geographic area requested by the patient (or if unable, by the patient's family).  Yes   Patient/family informed of their freedom to choose among providers that offer the needed level of care, that participate in Medicare, Medicaid or managed care program needed by the patient, have an available bed and are willing to accept the patient.  Yes   Patient/family informed of [facility]'s ownership interest in [facility] and [facility], as well as of the fact that they are under no obligation to receive care at these facilities.  PASRR submitted to EDS on 10/29/14     PASRR number received on 10/29/14     Existing PASRR number confirmed on       FL2 transmitted to all facilities in geographic area requested by pt/family on       FL2 transmitted to all facilities within larger geographic area on       Patient informed that his/her managed care company has contracts with or will negotiate with certain facilities, including the following:            Patient/family informed of bed offers received.  Patient chooses bed at       Physician recommends and patient chooses bed at      Patient to be transferred to   on  .  Patient to be transferred to facility by       Patient family notified on   of transfer.  Name of family member notified:        PHYSICIAN       Additional Comment:    _______________________________________________ Rigoberto Noel, LCSW 10/29/2014, 10:47 AM

## 2014-10-29 NOTE — Progress Notes (Signed)
Subjective:  Events of last night noted.  Pt reportedly twitching and received Ativan, Dilantin load. She was seen and examined this AM.  She became upset and sister says she is upset because she does not want to go to SNF after discharge.  When I started to examine her her lower right jaw began to twitch.  She was completely alert and following commands.  VSS.  Objective: Vital signs in last 24 hours: Filed Vitals:   10/28/14 2035 10/29/14 0033 10/29/14 0525 10/29/14 0924  BP: 137/77 146/82 119/75 124/69  Pulse: 81 87 95 91  Temp: 99.1 F (37.3 C) 98.7 F (37.1 C) 98.6 F (37 C) 98.5 F (36.9 C)  TempSrc: Oral Oral Oral Oral  Resp: 18 18 18 18   Height:      SpO2: 96% 96% 98% 98%   Weight change:   Intake/Output Summary (Last 24 hours) at 10/29/14 0929 Last data filed at 10/29/14 0900  Gross per 24 hour  Intake     20 ml  Output      1 ml  Net     19 ml   General: resting in bed HEENT: no scleral icterus Cardiac: RRR, no rubs, murmurs or gallops Pulm: clear to auscultation bilaterally, moving normal volumes of air Abd: soft, nontender, nondistended, BS present Ext: warm and well perfused, no pedal edema  Neurologic Exam:   Mental Status: Alert, oriented.  Expressive aphasia. Able to follow 3 step commands without difficulty.  Cranial Nerves:   II: PERRL but sluggish.  III/IV/VI: Extraocular movements intact.  Pupils reactive bilaterally.  V/VII: Smile symmetric. Facial light touch sensation normal bilaterally.  VIII: Grossly intact.  IX/X: Uvula midline.   XI: Bilateral shoulder shrug normal.  XII: Midline tongue extension normal.  Motor:  4/5 UE bilaterally with normal tone and bulk; moving LE spontaneously but does not cooperate with strength testing of LE  Sensory:  Appears grossly intact.  DTRs: 2+ and symmetric throughout  Plantars:  Downgoing bilaterally  Cerebellar: Deferred.    Lab Results: Basic Metabolic Panel:  Recent Labs Lab 10/26/14 1710  10/27/14 0550  NA 130* 138  K 4.0 3.5  CL 99* 106  CO2 20* 20*  GLUCOSE 86 76  BUN 8 5*  CREATININE 0.84 0.71  CALCIUM 8.7* 9.1  MG 1.7  --   PHOS 3.2  --    Liver Function Tests:  Recent Labs Lab 10/26/14 1710  AST 38  ALT 20  ALKPHOS 63  BILITOT 1.0  PROT 6.4*  ALBUMIN 3.4*   CBC:  Recent Labs Lab 10/26/14 1356 10/26/14 1413 10/28/14 1052  WBC 9.4  --  6.5  NEUTROABS  --   --  3.8  HGB 13.9 15.0 12.3  HCT 39.6 44.0 34.9*  MCV 90.2  --  90.9  PLT 301  --  250   CBG:  Recent Labs Lab 10/26/14 1353  GLUCAP 143*   Hemoglobin A1C:  Recent Labs Lab 10/26/14 1710  HGBA1C <4.0*   Thyroid Function Tests:  Recent Labs Lab 10/26/14 1710  TSH 0.742   Anemia Panel:  Recent Labs Lab 10/26/14 1710  VITAMINB12 1459*   Urine Drug Screen: Drugs of Abuse     Component Value Date/Time   LABOPIA NONE DETECTED 10/27/2014 1225   COCAINSCRNUR NONE DETECTED 10/27/2014 1225   LABBENZ POSITIVE* 10/27/2014 1225   AMPHETMU NONE DETECTED 10/27/2014 1225   THCU NONE DETECTED 10/27/2014 1225   LABBARB NONE DETECTED 10/27/2014 1225   Urinalysis:  Recent Labs Lab 10/26/14 1404  COLORURINE YELLOW  LABSPEC 1.015  PHURINE 5.5  GLUCOSEU NEGATIVE  HGBUR NEGATIVE  BILIRUBINUR MODERATE*  KETONESUR NEGATIVE  PROTEINUR 100*  UROBILINOGEN 1.0  NITRITE NEGATIVE  LEUKOCYTESUR NEGATIVE   Studies/Results: Ct Head Wo Contrast  10/28/2014  CLINICAL DATA:  Aphasia. EXAM: CT HEAD WITHOUT CONTRAST TECHNIQUE: Contiguous axial images were obtained from the base of the skull through the vertex without intravenous contrast. COMPARISON:  10/26/2014 FINDINGS: Again noted is the old infarct on the left in the region of the insular cortex and deep white matter, stable. No acute infarction. Mild ex vacuo dilatation of the left lateral ventricle. No hydrocephalus. No hemorrhage or midline shift. Visualized paranasal sinuses and mastoids clear. Orbital soft tissues unremarkable.  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  No acute intracranial abnormality. Electronically Signed   By: Rolm Baptise M.D.   On: 10/28/2014 15:52   10/27/14 EEG: Impression: this is an abnormal EEG due to a disorganized background pattern involving the left hemisphere with intermixed repetitive sharp waves over the left temporal region (T3,T5). 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 No electrographic seizures noted. Clinical correlation is advised.   Medications: I have reviewed the patient's current medications. Scheduled Meds: . aspirin  81 mg Oral Daily  . atorvastatin  20 mg Oral q1800  . enoxaparin (LOVENOX) injection  40 mg Subcutaneous Q24H  . levETIRAcetam  1,000 mg Intravenous Q12H  . LORazepam  1 mg Oral Once  . multivitamin with minerals  1 tablet Oral Daily  . pantoprazole  40 mg Oral Daily  . phenytoin (DILANTIN) IV  100 mg Intravenous 3 times per day  . sodium chloride  3 mL Intravenous Q12H  . thiamine IV  200 mg Intravenous Q24H   Continuous Infusions: none  PRN Meds:.acetaminophen **OR** acetaminophen, albuterol   Assessment/Plan: 59 year old woman with PMH of CVA (with residual R sided weakness) and seizure here with recurrent seizure and new expressive aphasia.  Seizures:  Facial twitching noted yesterday evening and again this AM.  No altered consciousness, she is following commands, VSS. She has not yet received AM dose of Keppra (was due at [place]).  She was loaded with Dilantin last night and had 100mg  at 6AM; Dilantin level was ordered but not yet drawn. - stat IV Ativan 1mg  x 2 given - AM Keppra dose stat and RN administering now - stat Dilantin level and albumin level (lab notified and will send someone up to draw it now); increase Dilantin dose if level low or add third agent (Vimpat) if level ok   LOS: 3 days   Francesca Oman, DO  IMTS  PGY3 on Neurology Service 10/29/2014, 9:29 AM   I personally participated in this patient's evaluation and management along with PGY3 Dr. Redmond Pulling, including formulating the above clinical impression and management recommendations.  We will continue to follow this patient with you.  Rush Farmer M.D. [facility] [phone]

## 2014-10-29 NOTE — Progress Notes (Signed)
CM consult was received for SNF placement.  CSW was made aware and will follow for placement.  Lorne Skeens RN, MSN [phone]

## 2014-10-29 NOTE — Care Management Note (Signed)
Case Management Note  Patient Details  Name: Tina Sanders MRN: [id number] Date of Birth: 04/06/1955  Subjective/Objective:                    Action/Plan: CM spoke with CSW, who states that patient's sister is requesting that patient be discharged home with home health services.  CM met with patient and sister, Tina Sanders, at bedside.  Sister confirmed that patient would not want SNF placement and would prefer to be discharged home.  Sister states that she will be with patient 24/7, but would like the maximum amount of appropriate home health services.  CM notified Resident of request for [facility] orders.  [name] has chosen [facility] once [place] orders are placed.  Patient follows at the [facility] clinic.  She will be discharging home with her sister, Tina Sanders, [phone]. The address was verified and is correct in the system.  Miranda with [facility] was notified of patient's choice.  Awaiting orders to finalize discharge plans.  Expected Discharge Date:                  Expected Discharge Plan:  [place]  In-House Referral:     Discharge planning Services  CM Consult  Post Acute Care Choice:    Choice offered to:  Sibling, Patient  DME Arranged:    DME Agency:     HH Arranged:    HH Agency:     Status of Service:  In process, will continue to follow  Medicare Important Message Given:  Yes-second notification given Date Medicare IM Given:    Medicare IM give by:    Date Additional Medicare IM Given:    Additional Medicare Important Message give by:     If discussed at [place] of Stay Meetings, dates discussed:    Additional Comments:  Rolm Baptise, RN 10/29/2014, 2:56 PM

## 2014-10-29 NOTE — NC FL2 (Signed)
[place] LEVEL OF CARE SCREENING TOOL     IDENTIFICATION  Patient Name: Tina Sanders Birthdate: [birth year] Sex: female Admission Date (Current Location): 10/26/2014  [facility] and [place] Number: Therapist, sports and Address:  [facility] [address] Provider Number: [id number]   Attending Physician Name and Address:  Axel Filler, MD  Relative Name and Address:  Blanch Media Same as patient  Current Level of Care: Hospital Recommended Level of Care: [facility] Rehab Prior Approval Number:    Date Approved/Denied:   PASRR Number: [id number]  Discharge Plan: SNF    Current Diagnoses: Patient Active Problem List   Diagnosis Date Noted  . Hepatitis C antibody test positive 10/27/2014  . Seizure ([place]) 10/27/2014  . Malnutrition of moderate degree 10/27/2014  . Hyperlipidemia 10/15/2014  . Essential hypertension 10/14/2014  . GERD (gastroesophageal reflux disease) 10/14/2014  . Generalized seizures ([place]) 10/14/2014  . Vitamin D deficiency 10/14/2014  . Vitamin B12 deficiency 10/14/2014  . Asthma 10/14/2014  . CVA (cerebral vascular accident) ([place]) 10/14/2014    DISORIENTED AMBULATORY STATUS BLADDER BOWEL    Ambulatory Incontinent Continent  INAPPROPRIATE BEHAVIOR FUNCTIONAL LIMITATIONS COMMUNICATION OF NEEDS RESPIRATION    Speech, Hearing  (Incomprehensible speech) Normal  PERSONAL CARE ASSISTANCE ACTIVITIES/SOCIAL SKIN NUTRITION STATUS  Bathing, Feeding, Dressing   Normal Diet (DYS 1)  PHYSICIAN VISITS NEUROLOGICAL      Convulsions/Seizures     SPECIAL CARE FACTORS FREQUENCY  PT (By licensed PT), Speech therapy     PT Frequency: 5/wk       Speech Therapy Frequency: 2/wk     Current Medications (10/29/2014): Current Facility-Administered Medications  Medication Dose Route Frequency Provider Last Rate Last Dose  . acetaminophen (TYLENOL) tablet 650 mg  650 mg Oral Q6H PRN Juluis Mire, MD       Or  . acetaminophen (TYLENOL)  suppository 650 mg  650 mg Rectal Q6H PRN Marjan Rabbani, MD      . albuterol (PROVENTIL) (2.5 MG/3ML) 0.083% nebulizer solution 2.5 mg  2.5 mg Nebulization Q4H PRN Axel Filler, MD      . aspirin chewable tablet 81 mg  81 mg Oral Daily Marjan Rabbani, MD   81 mg at 10/28/14 0954  . atorvastatin (LIPITOR) tablet 20 mg  20 mg Oral q1800 Iline Oven, MD   20 mg at 10/28/14 1737  . enoxaparin (LOVENOX) injection 40 mg  40 mg Subcutaneous Q24H Marjan Rabbani, MD   40 mg at 10/28/14 2251  . levETIRAcetam (KEPPRA) 1,000 mg in sodium chloride 0.9 % 100 mL IVPB  1,000 mg Intravenous Q12H Marliss Coots, PA-C   1,000 mg at 10/28/14 2255  . LORazepam (ATIVAN) 2 MG/ML injection           . LORazepam (ATIVAN) injection 1 mg  1 mg Intravenous Once Francesca Oman, DO      . LORazepam (ATIVAN) tablet 1 mg  1 mg Oral Once Jones Bales, MD   1 mg at 10/28/14 1517  . multivitamin with minerals tablet 1 tablet  1 tablet Oral Daily Jones Bales, MD      . pantoprazole (PROTONIX) EC tablet 40 mg  40 mg Oral Daily Iline Oven, MD   40 mg at 10/28/14 0953  . phenytoin (DILANTIN) injection 100 mg  100 mg Intravenous 3 times per day Iline Oven, MD      . sodium chloride 0.9 % injection 3 mL  3 mL Intravenous Q12H Marjan Rabbani,  MD   3 mL at 10/28/14 2313  . thiamine (B-1) injection 200 mg  200 mg Intravenous Q24H Norval Gable, MD   200 mg at 10/28/14 1031   Do not use this list as official medication orders. Please verify with discharge summary.  Discharge Medications:   Medication List    ASK your doctor about these medications        albuterol (5 MG/ML) 0.5% nebulizer solution  Commonly known as:  PROVENTIL  Take 0.5 mLs (2.5 mg total) by nebulization every 6 (six) hours as needed for wheezing or shortness of breath.     atorvastatin 20 MG tablet  Commonly known as:  LIPITOR  Take 1 tablet (20 mg total) by mouth daily at 6 PM.     ergocalciferol 50000 UNITS capsule   Commonly known as:  VITAMIN D2  Take 1 capsule (50,000 Units total) by mouth once a week.     levETIRAcetam 500 MG tablet  Commonly known as:  KEPPRA  Take 1 tablet (500 mg total) by mouth 2 (two) times daily.     Linaclotide 145 MCG Caps capsule  Commonly known as:  LINZESS  Take 1 capsule (145 mcg total) by mouth daily.     lisinopril-hydrochlorothiazide 10-12.5 MG tablet  Commonly known as:  ZESTORETIC  Take 1 tablet by mouth daily.     megestrol 400 MG/10ML suspension  Commonly known as:  MEGACE  Take 5 mLs (200 mg total) by mouth 3 (three) times daily with meals.     omeprazole 20 MG capsule  Commonly known as:  PRILOSEC  Take 1 capsule (20 mg total) by mouth daily.     potassium chloride 10 MEQ tablet  Commonly known as:  K-DUR  Take 2 tablets (20 mEq total) by mouth daily.        Relevant Imaging Results:  Relevant Lab Results:  Recent Labs    Additional Information    Rigoberto Noel, LCSW

## 2014-10-29 NOTE — Evaluation (Signed)
Speech Language Pathology Evaluation Patient Details Name: Tina Sanders MRN: [id number] DOB: [birth year] Today's Date: 10/29/2014 Time: 7017-7939 SLP Time Calculation (min) (ACUTE ONLY): 16 min  Problem List:  Patient Active Problem List   Diagnosis Date Noted  . Hepatitis C antibody test positive 10/27/2014  . Seizure ([place]) 10/27/2014  . Malnutrition of moderate degree 10/27/2014  . Hyperlipidemia 10/15/2014  . Essential hypertension 10/14/2014  . GERD (gastroesophageal reflux disease) 10/14/2014  . Generalized seizures ([place]) 10/14/2014  . Vitamin D deficiency 10/14/2014  . Vitamin B12 deficiency 10/14/2014  . Asthma 10/14/2014  . CVA (cerebral vascular accident) ([place]) 10/14/2014   Past Medical History:  Past Medical History  Diagnosis Date  . GERD (gastroesophageal reflux disease)   . Seizures ([place])   . Hypertension   . B12 deficiency   . Vitamin D deficiency disease   . Stroke [facility])     residual right sided deficits, expressive dysphasia   Past Surgical History: History reviewed. No pertinent past surgical history. HPI:  59 yo female with HTN, GERD, h/o seizure d/o, and h/o CVA with residual right sided weakness, presenting with seizure. History is obtained from the patient's sister.  Pt had stroke 2013; was living with a brother in [place] who was caregiver and killed in [place], precipitating her move here to sister's residence. [place] able to ambulate and converse fluently. [place] three months ago sustaining fractured ribs with ongoing pain.  Recent assessments at OP neurorehab and was getting ready to begin therapies, including OT/PT/ST.     Assessment / Plan / Recommendation Clinical Impression  Pt has a severe receptive and expressive aphasia. Pt makes attempts to communicate, but the only intelligible words produced were single, echolalic words. Receptively, she does not have meaningful responses to basic yes/no questions and requires Max cues to facilitate one-step  commands. Pt will need SLP f/u to address communication needs.    SLP Assessment  Patient needs continued Speech Lanaguage Pathology Services    Follow Up Recommendations  Inpatient Rehab    Frequency and Duration min 2x/week  2 weeks   Pertinent Vitals/Pain Pain Assessment: Faces Faces Pain Scale: No hurt   SLP Goals  Patient/Family Stated Goal: none stated Potential to Achieve Goals (ACUTE ONLY): Good Potential Considerations (ACUTE ONLY): Severity of impairments  SLP Evaluation Prior Functioning  Cognitive/Linguistic Baseline: Information not available Available Help at Discharge: Family;Available 24 hours/day   Cognition  Overall Cognitive Status: Difficult to assess (aphasia) Arousal/Alertness: Awake/alert    Comprehension  Auditory Comprehension Overall Auditory Comprehension: Impaired Yes/No Questions: Impaired Basic Biographical Questions: 0-25% accurate Basic Immediate Environment Questions: 0-24% accurate Commands: Impaired One Step Basic Commands: 0-24% accurate    Expression Expression Primary Mode of Expression: Verbal Verbal Expression Overall Verbal Expression: Impaired Initiation: No impairment Naming: Impairment Confrontation: Impaired Verbal Errors: Neologisms;Aware of errors   Oral / Motor Motor Speech Overall Motor Speech: Other (comment) (difficult to assess due to severity of aphasia)    Germain Osgood, M.A. CCC-SLP [phone]  Germain Osgood 10/29/2014, 3:23 PM

## 2014-10-29 NOTE — Progress Notes (Signed)
Subjective: [name].  CT head does not demonstrate new stroke.  Neurologic evaluation concerning for continued lower facial tics.  Patient was started on Dilantin IV in addition to previous Keppra 1000 mg BID.  She intermittently follows verbal commands.  Objective: Vital signs in last 24 hours: Filed Vitals:   10/28/14 0938 10/28/14 2035 10/29/14 0033 10/29/14 0525  BP: 143/77 137/77 146/82 119/75  Pulse: 85 81 87 95  Temp: 99.1 F (37.3 C) 99.1 F (37.3 C) 98.7 F (37.1 C) 98.6 F (37 C)  TempSrc: Oral Oral Oral Oral  Resp: 20 18 18 18   Height:      SpO2: 98% 96% 96% 98%   Weight change:   Intake/Output Summary (Last 24 hours) at 10/29/14 0842 Last data filed at 10/28/14 1753  Gross per 24 hour  Intake      0 ml  Output      1 ml  Net     -1 ml   Physical Exam  Constitutional:  Thin female, sleeping in bed, NAD.   HENT:  Head: Normocephalic and atraumatic.  Eyes: EOM are normal.  Cardiovascular: Normal rate, regular rhythm, normal heart sounds and intact distal pulses.   Pulmonary/Chest:  Poor inspiratory effort. No wheezes or crackles appreciated.  Abdominal: Soft. She exhibits no distension. There is no tenderness. There is no rebound and no guarding.  Musculoskeletal: She exhibits no edema.  Neurological: She is alert.  EOMI. CN VII intact and symmetric.   Spontaneous movement of all extremities.  Unable to repeat sentences.  Tongue projects to the midline.  Right lower jaw facial tic appreciated.  Skin: Skin is warm and dry. No rash noted.    Lab Results: Basic Metabolic Panel:  Recent Labs Lab 10/26/14 1710 10/27/14 0550  NA 130* 138  K 4.0 3.5  CL 99* 106  CO2 20* 20*  GLUCOSE 86 76  BUN 8 5*  CREATININE 0.84 0.71  CALCIUM 8.7* 9.1  MG 1.7  --   PHOS 3.2  --    Liver Function Tests:  Recent Labs Lab 10/26/14 1710  AST 38  ALT 20  ALKPHOS 63  BILITOT 1.0  PROT 6.4*  ALBUMIN 3.4*   No results for input(s): LIPASE, AMYLASE in the last  168 hours. No results for input(s): AMMONIA in the last 168 hours. CBC:  Recent Labs Lab 10/26/14 1356 10/26/14 1413 10/28/14 1052  WBC 9.4  --  6.5  NEUTROABS  --   --  3.8  HGB 13.9 15.0 12.3  HCT 39.6 44.0 34.9*  MCV 90.2  --  90.9  PLT 301  --  250   Cardiac Enzymes: No results for input(s): CKTOTAL, CKMB, CKMBINDEX, TROPONINI in the last 168 hours. BNP: No results for input(s): PROBNP in the last 168 hours. D-Dimer: No results for input(s): DDIMER in the last 168 hours. CBG:  Recent Labs Lab 10/26/14 1353  GLUCAP 143*   Hemoglobin A1C:  Recent Labs Lab 10/26/14 1710  HGBA1C <4.0*   Fasting Lipid Panel: No results for input(s): CHOL, HDL, LDLCALC, TRIG, CHOLHDL, LDLDIRECT in the last 168 hours. Thyroid Function Tests:  Recent Labs Lab 10/26/14 1710  TSH 0.742   Coagulation: No results for input(s): LABPROT, INR in the last 168 hours. Anemia Panel:  Recent Labs Lab 10/26/14 1710  VITAMINB12 1459*   Urine Drug Screen: Drugs of Abuse     Component Value Date/Time   LABOPIA NONE DETECTED 10/27/2014 1225   COCAINSCRNUR NONE DETECTED 10/27/2014 1225  LABBENZ POSITIVE* 10/27/2014 1225   AMPHETMU NONE DETECTED 10/27/2014 1225   THCU NONE DETECTED 10/27/2014 1225   LABBARB NONE DETECTED 10/27/2014 1225    Alcohol Level: No results for input(s): ETH in the last 168 hours. Urinalysis:  Recent Labs Lab 10/26/14 1404  COLORURINE YELLOW  LABSPEC 1.015  PHURINE 5.5  GLUCOSEU NEGATIVE  HGBUR NEGATIVE  BILIRUBINUR MODERATE*  KETONESUR NEGATIVE  PROTEINUR 100*  UROBILINOGEN 1.0  NITRITE NEGATIVE  LEUKOCYTESUR NEGATIVE   Misc. Labs:   Micro Results: No results found for this or any previous visit (from the past 240 hour(s)). Studies/Results: Ct Head Wo Contrast  10/28/2014  CLINICAL DATA:  Aphasia. EXAM: CT HEAD WITHOUT CONTRAST TECHNIQUE: Contiguous axial images were obtained from the base of the skull through the vertex without  intravenous contrast. COMPARISON:  10/26/2014 FINDINGS: Again noted is the old infarct on the left in the region of the insular cortex and deep white matter, stable. No acute infarction. Mild ex vacuo dilatation of the left lateral ventricle. No hydrocephalus. No hemorrhage or midline shift. Visualized paranasal sinuses and mastoids clear. Orbital soft tissues unremarkable. 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  No acute intracranial abnormality. Electronically Signed   By: Rolm Baptise M.D.   On: 10/28/2014 15:52   Medications: I have reviewed the patient's current medications. Scheduled Meds: . aspirin  81 mg Oral Daily  . atorvastatin  20 mg Oral q1800  . enoxaparin (LOVENOX) injection  40 mg Subcutaneous Q24H  . levETIRAcetam  1,000 mg Intravenous Q12H  . LORazepam  1 mg Oral Once  . multivitamin with minerals  1 tablet Oral Daily  . pantoprazole  40 mg Oral Daily  . phenytoin (DILANTIN) IV  100 mg Intravenous 3 times per day  . sodium chloride  3 mL Intravenous Q12H  . thiamine IV  200 mg Intravenous Q24H   Continuous Infusions:  PRN Meds:.acetaminophen **OR** acetaminophen, albuterol Assessment/Plan: Principal Problem:   Generalized seizures (HCC) Active Problems:   Essential hypertension   GERD (gastroesophageal reflux disease)   CVA (cerebral vascular accident) ([place])   Hyperlipidemia   Hepatitis C antibody test positive   Seizure ([place])   Malnutrition of moderate degree  Tina Sanders is a 59 yo female with HTN, GERD, h/o seizure d/o, and h/o CVA with residual right sided weakness, presenting with seizure.  Tonic-Clonic Seizure: Duration of seizure unknown, but at least 10-15 minutes and requiring Versed to terminate. Patient remains post-ictal approximately 4 hours after the seizure terminated. She has been off anti-epileptics (Keppra) for at least one month. Patient has a h/o seizure, though the underlying etiology or usual manifestation of her seizure is unknown. Current seizure  episode likely due to medication nonadherence, as family has been unable to obtain her medications. Although CT head was negative for acute bleed or stroke, patient remains aphasic. It is possible her aphasia is still her post-ictal phase vs residual effects of Versed vs continued seizure vs stroke.  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  Neuro consult noted concern for continued focal seizure.  She was given another IV Keppra load and daily dose increased to 1000mg  IV BID.  CT brain negative for new stroke.  She does continue to exhibit lower facial tic's c/f continued focal seizure.  She was started on Dilantin IV for added anti-epileptic effect.  [name] also reports a history of sickle cell disease, which would greatly increase her risk of stroke.   - CT  brain negative - ASA 81 daily - Atorva 20 PO - HOLD Lisinopril-HCTZ in setting of possible stroke - Keppra 1000 mg IV load and 1000 mg IV BID - Dilantin 100 mg TID - [place] - SLP: dysphagia 1 [ ]  RPR [ ]  PT/OT [ ]  Hgb electrophoresis [ ]  Therapeutic phenytoin and keppra levels  Aspiration Pneumonitis: Concern for aspiration PNA given patient's hypoxia on arrival to ED and CXR findings, requiring nonrebreather. Patient has been weaned to room air without continued hypoxemia without SIRS.  No antibiotics necessary at this time as it is was likely pneumonitis.  HCV: Antibody elevated.  No known h/o HCV. - HCV RNA quant negative  H/o CVA: Sister believes CVA 1-2 years ago, with right sided weakness improving. At baseline, patient able to ambulate and fluently converse. Although patient records from her previous PCP are still pending, it does not appear she was on ASA therapy. Current concern for new stroke. - ASA - CT brain negative  GERD: Protonix PO HTN: Hold Lisinopril-HCTZ  FEN/GI: - Dysphagia 1 - Protonix IV  DVT Ppx: Lovenox  Dispo: Disposition is deferred at  this time, awaiting improvement of current medical problems.  Anticipated discharge in approximately 1-2 day(s).   The patient does have a current PCP ([facility], [facility]ac[facility] Pontes) and does need an [facility] hospital follow-up appointment after discharge.  The patient does not have transportation limitations that hinder transportation to clinic appointments.  .Services Needed at time of discharge: Y = Yes, Blank = No PT: CIR and 24 hour supervision  OT:   RN:   Equipment:   Other:     LOS: 3 days   Iline Oven, MD 10/29/2014, 8:42 AM

## 2014-10-29 NOTE — Clinical Social Work Note (Signed)
Clinical Social Work Assessment  Patient Details  Name: Tina Sanders MRN: [id number] Date of Birth: 12/25/1955  Date of referral:  10/29/14               Reason for consult:  Discharge Planning, Facility Placement                Permission sought to share information with:  Family Supports, Chartered certified accountant granted to share information::  Yes, Verbal Permission Granted  Name::     Banker::  SNFs  Relationship::     Contact Information:     Housing/Transportation Living arrangements for the past 2 months:  Apartment Source of Information:  Other (Comment Required) (Sister and caregiver Tina Sanders at bedside) Patient Interpreter Needed:  None Criminal Activity/Legal Involvement Pertinent to Current Situation/Hospitalization:  No - Comment as needed Significant Relationships:  Siblings Lives with:  Siblings Do you feel safe going back to the place where you live?  Yes Need for family participation in patient care:  Yes (Comment)  Care giving concerns:  Patient and sister agree that the patient needs continued rehab at discharge. They were hoping for a CIR admission but are now considering SNF options.   Social Worker assessment / plan:  CSW met with patient and patient's sister at bedside to complete assessment. Patient presents with incomprehensible speech and defers CSW to her sister Tina Sanders at bedside. Tina Sanders shares that she is the caregiver for the patient and agrees to looking into SNF options for the patient's therapy. CSW explained SNF search/placement process and answered the sister's questions. The patient does seem hesitant to discharge to SNF but her sister will speak with her about this. CSW will followup with bed offers this afternoon.   Employment status:  Disabled (Comment on whether or not currently receiving Disability) Insurance information:  Medicare PT Recommendations:  Inpatient Rehab Consult Information / Referral to community resources:   [place]  Patient/Family's Response to care:  Patient and sister appear to be happy with the care the patient has received.  Patient/Family's Understanding of and Emotional Response to Diagnosis, Current Treatment, and Prognosis:  Unable to assess for patient. [name] seems to have good understanding of reason for admission and the patient's post DC needs.   Emotional Assessment Appearance:  Appears older than stated age Attitude/Demeanor/Rapport:  Other (Appropriate) Affect (typically observed):  Accepting, Calm Orientation:    Alcohol / Substance use:  Tobacco Use Psych involvement (Current and /or in the community):  No (Comment)  Discharge Needs  Concerns to be addressed:  Discharge Planning Concerns Readmission within the last 30 days:  No Current discharge risk:  Physical Impairment, Chronically ill Barriers to Discharge:  Continued Medical Work up   [employer] MSW, [place], [place], [phone]

## 2014-10-29 NOTE — Progress Notes (Signed)
EEG completed, results pending. 

## 2014-10-29 NOTE — Progress Notes (Signed)
Pt. Was found sitting up in floor next to bed at 1855. Fall unwitnessed. No injuries noted, vital signs stable. Assisted patient back to bed. MD (Ranell Patrick) notified, as was sister.

## 2014-10-29 NOTE — Care Management Important Message (Signed)
Important Message  Patient Details  Name: Tina Sanders MRN: [id number] Date of Birth: Nov 23, 1955   Medicare Important Message Given:  Yes-second notification given    Delorse Lek 10/29/2014, 12:17 PM

## 2014-10-29 NOTE — Consult Note (Signed)
October 29, 2014 Pharmacy  Pharmacy Students rounding with IMTP-[facility] Service. The patient was initiated on phenytoin 1000 mg loading dose, followed by 100 mg three times daily, for tonic-clonic seizures. Patient is also slightly hypalbuminemic at 3.4. It is suggested that a trough free and total phenytoin level be collected tomorrow morning prior to the administration of the first dose. Our total drug target concentration should be between 10 and 20 mg/L.  Phenytoin is an hydantoin anticonvulsant indicated in tonic-clonic and complex partial seizures. It is the drug of choice in status epilepticus. Phenytoin is highly protein bound (90-95%), with the therapeutic drug level ranging from 10 to 20 mg/L total, and 1-2 mg/L free. The time to eliminate 50% of drug (t50) of phenytoin is approximately 22 hours at therapeutic levels, but increases as drug concentration increases due to non-linear, saturable kinetics. Additionally, toxicity begins to occur soon after drug concentrations exceed 20 mg/L. At levels >20 mg/L lateral nystagmus occurs; >30 mg/L is associated with ataxia and lateral gaze nystagmus; >40 mg/L results in decreased mentation/sedation; and levels >100 mg/L lead to death.   When to check levels depends highly on formulation and if loading doses are administered. Indications for drug level monitoring include newly started patients, dose adjustments, suspected non-compliance, and suspected toxicity. If a patient is initiated with IV phenytoin then a trough level may be checked one hour after the dose. If loaded with oral phenytoin, then a trough level may be obtained 24 hours after the load. In normal initiation (without load) or in dose changes, then levels should be checked after 5 to 7 half-lives (7 to 10 days).   A free phenytoin level checks the unbound phenytoin level, and has utility in patients with abnormal albumin levels. Additionally, one free level can be checked concurrently with  the total level to allow for a roughly linear estimation of future total phenytoin levels (90-95% accurate as it is 90 to 95% protein bound).   Trinna Balloon. Nohemy Koop, PharmD Candidate and Aura Fey. March Rummage, PharmD Candidate  References:  1. Nani Gasser [facility]. Phenytoin: A Guide to Therapeutic Drug Monitoring. Proceedings of [employer]. 2013; 22(3): 198-202.  2. [employer] MM. Phenytoin Dosing and Titration. [employer]. 2012. CostumeResources.fi.action?pageId=90736163. Accessed: October 29, 2014.   3. [employer].  [place], [place].  Available at: http://online.HugeFiesta.cz. Accessed October 29, 2014.

## 2014-10-30 DIAGNOSIS — R569 Unspecified convulsions: Secondary | ICD-10-CM | POA: Insufficient documentation

## 2014-10-30 DIAGNOSIS — D571 Sickle-cell disease without crisis: Secondary | ICD-10-CM | POA: Diagnosis present

## 2014-10-30 LAB — CBC
HCT: 37.9 % (ref 36.0–46.0)
Hemoglobin: 12.9 g/dL (ref 12.0–15.0)
MCH: 30.8 pg (ref 26.0–34.0)
MCHC: 34 g/dL (ref 30.0–36.0)
MCV: 90.5 fL (ref 78.0–100.0)
PLATELETS: 278 10*3/uL (ref 150–400)
RBC: 4.19 MIL/uL (ref 3.87–5.11)
RDW: 13.6 % (ref 11.5–15.5)
WBC: 6.3 10*3/uL (ref 4.0–10.5)

## 2014-10-30 LAB — LACTATE DEHYDROGENASE: LDH: 157 U/L (ref 98–192)

## 2014-10-30 LAB — HEPATIC FUNCTION PANEL
ALBUMIN: 3.3 g/dL — AB (ref 3.5–5.0)
ALT: 13 U/L — AB (ref 14–54)
AST: 24 U/L (ref 15–41)
Alkaline Phosphatase: 54 U/L (ref 38–126)
Bilirubin, Direct: 0.3 mg/dL (ref 0.1–0.5)
Indirect Bilirubin: 0.9 mg/dL (ref 0.3–0.9)
TOTAL PROTEIN: 6.7 g/dL (ref 6.5–8.1)
Total Bilirubin: 1.2 mg/dL (ref 0.3–1.2)

## 2014-10-30 LAB — BASIC METABOLIC PANEL
ANION GAP: 9 (ref 5–15)
CHLORIDE: 107 mmol/L (ref 101–111)
CO2: 22 mmol/L (ref 22–32)
Calcium: 8.7 mg/dL — ABNORMAL LOW (ref 8.9–10.3)
Creatinine, Ser: 0.65 mg/dL (ref 0.44–1.00)
GFR calc non Af Amer: >60 mL/min (ref >60)
Glucose, Bld: 97 mg/dL (ref 65–99)
POTASSIUM: 3.2 mmol/L — AB (ref 3.5–5.1)
Sodium: 138 mmol/L (ref 135–145)

## 2014-10-30 LAB — RETICULOCYTES
RBC.: 4.19 MIL/uL (ref 3.87–5.11)
Retic Count, Absolute: 171.8 10*3/uL (ref 19.0–186.0)
Retic Ct Pct: 4.1 % — ABNORMAL HIGH (ref 0.4–3.1)

## 2014-10-30 LAB — PHENYTOIN LEVEL, TOTAL: PHENYTOIN LVL: 19.1 ug/mL (ref 10.0–20.0)

## 2014-10-30 MED ORDER — SODIUM CHLORIDE 0.9 % IV SOLN
500.0000 mg | Freq: Once | INTRAVENOUS | Status: AC
  Administered 2014-10-30: 500 mg via INTRAVENOUS
  Filled 2014-10-30: qty 5

## 2014-10-30 MED ORDER — SODIUM CHLORIDE 0.9 % IV SOLN
1500.0000 mg | Freq: Two times a day (BID) | INTRAVENOUS | Status: DC
  Administered 2014-10-30: 1500 mg via INTRAVENOUS
  Filled 2014-10-30 (×4): qty 15

## 2014-10-30 MED ORDER — POTASSIUM CHLORIDE CRYS ER 20 MEQ PO TBCR
40.0000 meq | EXTENDED_RELEASE_TABLET | Freq: Once | ORAL | Status: AC
  Administered 2014-10-30: 40 meq via ORAL
  Filled 2014-10-30: qty 2

## 2014-10-30 MED ORDER — ATORVASTATIN CALCIUM 40 MG PO TABS
40.0000 mg | ORAL_TABLET | Freq: Every day | ORAL | Status: DC
  Administered 2014-10-30 – 2014-10-31 (×2): 40 mg via ORAL
  Filled 2014-10-30: qty 1

## 2014-10-30 MED ORDER — SODIUM CHLORIDE 0.9 % IV SOLN
200.0000 mg | Freq: Two times a day (BID) | INTRAVENOUS | Status: DC
  Administered 2014-10-30 (×2): 200 mg via INTRAVENOUS
  Filled 2014-10-30 (×5): qty 20

## 2014-10-30 NOTE — Clinical Social Work Note (Signed)
Patient and family have decided that the patient will go home at discharge instead of going to SNF. CSW signing off.    Liz Beach MSW, [place], [place], [phone]

## 2014-10-30 NOTE — Progress Notes (Signed)
Pt pulled IV line. Restarted another line.

## 2014-10-30 NOTE — Progress Notes (Signed)
Speech Language Pathology Treatment: Dysphagia;Cognitive-Linquistic  Patient Details Name: Tina Sanders MRN: [id number] DOB: 1955-03-11 Today's Date: 10/30/2014 Time: 4098-1191 SLP Time Calculation (min) (ACUTE ONLY): 19 min  Assessment / Plan / Recommendation Clinical Impression  F/u for communication and swallowing.  Pt more lethargic today.  Mod assist to self-feed at lunch; having more difficulty following commands.  Continues with food residue throughout oral cavity and spilling from mouth with limited awareness of its presence.  Mod cues to decrease rate and bolus size.  Current diet of dysphagia 1, nectar remains best given reduced oral manipulation and MS. No s/s of aspiration. Pt had bilateral tremors of forehead during session.  She initiated effort to communicate verbally with single sound vocalizations and hand gestures, but speech was not intelligible.  She did shake her head yes and no in response to questions re: basic needs.   I agree with PT regarding concerns about the level of care [name] will require once she is medically ready for D/C. Will continue to follow for communication and swallowing.    HPI Other Pertinent Information: 59 yo female with HTN, GERD, h/o seizure d/o, and h/o CVA with residual right sided weakness, presenting with seizure. History is obtained from the patient's sister.  Pt had stroke 2013; was living with a brother in [place] who was caregiver and killed in [place], precipitating her move here to sister's residence. [place] able to ambulate and converse fluently. [place] three months ago sustaining fractured ribs with ongoing pain.  Recent assessments at OP neurorehab and was getting ready to begin therapies, including OT/PT/ST.     Pertinent Vitals Pain Assessment: Faces Faces Pain Scale: No hurt  SLP Plan  Continue with current plan of care    Recommendations Diet recommendations: Dysphagia 1 (puree);Nectar-thick liquid Liquids provided via:  Cup Medication Administration: Whole meds with puree Supervision: Patient able to self feed;Full supervision/cueing for compensatory strategies Compensations: Slow rate;Small sips/bites Postural Changes and/or Swallow Maneuvers: Seated upright 90 degrees              Oral Care Recommendations: Oral care BID Plan: Continue with current plan of care    GO     Tina Sanders 10/30/2014, 3:49 PM

## 2014-10-30 NOTE — Progress Notes (Signed)
October 29, 2014 Pharmacy  Pharmacy Students rounding with IMTP-BI/[facility] Service. After reviewing history, patient meets requirements for high intensity statin therapy, specifically due to her prior stroke. Additionally, a delayed onset drug interaction occurs when combining HMG-CoA Reductase Inhibitors (statins) with phenytoin. Phenytoin may cause a decrease in the serum concentration of the statin. This further supports the decision to increase this patient to a high intensity dose atorvastatin, either 40 or 80 mg.  Aura Fey. March Rummage, PharmD Candidate and Dierdre Harness, PharmD Candidate

## 2014-10-30 NOTE — Progress Notes (Signed)
Physical Therapy Treatment Patient Details Name: Tina Sanders MRN: [id number] DOB: 1955/04/30 Today's Date: 10/30/2014    History of Present Illness Pt is a 59 y/o female with HTN, GERD, h/o seizure d/o, and CVA with residual right sided weakness, presenting with seizure.Pt had stroke 2013; was living with a brother in [place] who was caregiver and killed in [place], precipitating her move here to sister's residence. Baseline able to ambulate and converse fluently. [place] three months ago sustaining fractured ribs with ongoing pain.    PT Comments    Pt not progressing towards physical therapy goals. Required increased assistance this session and noted continual twitching in face throughout session. Discussed d/c plan with family (sister and cousin) who were present during session and their wishes are to take the pt home when discharged. I continue to feel this patient would benefit from further therapy services at a higher level of care when pt is medically ready for d/c. The pt will need to be able to ascend 15 stairs to enter the apartment, and is currently not safe to attempt stair training. Family states they can carry her up the stairs if need be, however I strongly discouraged this. Will continue to follow and progress as able per POC.   Follow Up Recommendations  CIR;Supervision/Assistance - 24 hour     Equipment Recommendations  None recommended by PT    Recommendations for Other Services Rehab consult     Precautions / Restrictions Precautions Precautions: Fall Precaution Comments: Residual R sided weakness from prior stroke Restrictions Weight Bearing Restrictions: No    Mobility  Bed Mobility Overal bed mobility: Needs Assistance Bed Mobility: Supine to Sit;Sit to Supine     Supine to sit: Min guard Sit to supine: Min guard   General bed mobility comments: Close guard for safety.   Transfers Overall transfer level: Needs assistance Equipment used: 1 person hand held  assist Transfers: Sit to/from Stand Sit to Stand: Min assist;Mod assist         General transfer comment: Min assist to steady consistently, with occasional mod assist for sudden posterior LOB.   Ambulation/Gait Ambulation/Gait assistance: Mod assist;Max assist Ambulation Distance (Feet): 150 Feet Assistive device: Rolling walker (2 wheeled);1 person hand held assist Gait Pattern/deviations: Step-through pattern;Decreased stride length;Ataxic;Antalgic Gait velocity: Decreased Gait velocity interpretation: Below normal speed for age/gender General Gait Details: Pt initially ambulating without RW and required max assist for stability and balance. Pt reaching for IV pole for support. Pt was given RW and was able to improve overall gait pattern and balance, however required mod assist continually for safety and balance as well as walker negotiation   Stairs            Wheelchair Mobility    Modified Rankin (Stroke Patients Only) Modified Rankin (Stroke Patients Only) Pre-Morbid Rankin Score: No significant disability Modified Rankin: Moderately severe disability     Balance Overall balance assessment: Needs assistance Sitting-balance support: Feet supported;No upper extremity supported Sitting balance-Leahy Scale: Fair     Standing balance support: No upper extremity supported;During functional activity Standing balance-Leahy Scale: Poor                      Cognition Arousal/Alertness: Awake/alert Behavior During Therapy: Restless;Impulsive Overall Cognitive Status: Impaired/Different from baseline Area of Impairment: Attention;Following commands;Safety/judgement;Awareness;Problem solving   Current Attention Level: Selective   Following Commands: Follows one step commands inconsistently;Follows one step commands with increased time Safety/Judgement: Decreased awareness of safety;Decreased awareness of deficits Awareness: Emergent  Problem Solving: Slow  processing;Decreased initiation;Difficulty sequencing;Requires verbal cues;Requires tactile cues      Exercises      General Comments        Pertinent Vitals/Pain Pain Assessment: Faces Faces Pain Scale: No hurt    Home Living                      Prior Function            PT Goals (current goals can now be found in the care plan section) Acute Rehab PT Goals Patient Stated Goal: Pt unable to state goals PT Goal Formulation: Patient unable to participate in goal setting Time For Goal Achievement: 11/10/14 Potential to Achieve Goals: Good Progress towards PT goals: Progressing toward goals    Frequency  Min 3X/week    PT Plan Current plan remains appropriate    Co-evaluation             End of Session Equipment Utilized During Treatment: Gait belt Activity Tolerance: Patient tolerated treatment well Patient left: in bed;with call bell/phone within reach;with family/visitor present;with nursing/sitter in room     Time: 1008-1030 PT Time Calculation (min) (ACUTE ONLY): 22 min  Charges:  $Gait Training: 8-22 mins                    G Codes:      Rolinda Roan 11-Nov-2014, 12:28 PM  Rolinda Roan, PT, DPT Acute Rehabilitation Services Pager: [phone]

## 2014-10-30 NOTE — Progress Notes (Signed)
Subjective: Yesterday during the day, patient was noted to have continued facial twitching c/f focal seizure.  Twitching could not be aborted with Ativan 2 mg IV.  As it was unclear whether patient had therapeutic Phenytoin levels, a stat level was drawn.  It was within therapeutic range/slightly supratherapeutic given her albumin level. Vimpat was added to the regimen.  Overnight, patient was noticed to be sitting on the floor beside the bed.  There was no sign of injury and vitals were stable.   Objective: Vital signs in last 24 hours: Filed Vitals:   10/29/14 1857 10/29/14 2214 10/30/14 0220 10/30/14 0514  BP: 139/81 135/87 140/75 138/81  Pulse: 97 96 94 96  Temp: 98.1 F (36.7 C) 98 F (36.7 C) 98.4 F (36.9 C) 98.1 F (36.7 C)  TempSrc: Axillary Oral Oral Oral  Resp: 18 18 18 18   Height:      Weight:      SpO2: 97% 100% 100% 95%   Weight change:   Intake/Output Summary (Last 24 hours) at 10/30/14 0753 Last data filed at 10/29/14 0900  Gross per 24 hour  Intake     20 ml  Output      0 ml  Net     20 ml   Physical Exam  Constitutional:  Thin female, sleeping in bed, NAD.   HENT:  Head: Normocephalic and atraumatic.  Eyes: EOM are normal.  Cardiovascular: Normal rate, regular rhythm, normal heart sounds and intact distal pulses.   Pulmonary/Chest:  Poor inspiratory effort. No wheezes or crackles appreciated.  Abdominal: Soft. She exhibits no distension. There is no tenderness. There is no rebound and no guarding.  Musculoskeletal: She exhibits no edema.  Neurological: She is alert.  EOMI. CN VII intact and symmetric.   Spontaneous movement of all extremities.  Unable to repeat sentences.  Tongue projects to the midline.  Skin: Skin is warm and dry. No rash noted.    Lab Results: Basic Metabolic Panel:  Recent Labs Lab 10/26/14 1710  10/29/14 1608 10/30/14 0300  NA 130*  < > 140 138  K 4.0  < > 3.4* 3.2*  CL 99*  < > 110 107  CO2 20*  < > 23 22  GLUCOSE  86  < > 111* 97  BUN 8  < > <5* <5*  CREATININE 0.84  < > 0.70 0.65  CALCIUM 8.7*  < > 9.0 8.7*  MG 1.7  --   --   --   PHOS 3.2  --   --   --   < > = values in this interval not displayed. Liver Function Tests:  Recent Labs Lab 10/26/14 1710 10/29/14 1120  AST 38  --   ALT 20  --   ALKPHOS 63  --   BILITOT 1.0  --   PROT 6.4*  --   ALBUMIN 3.4* 3.4*   No results for input(s): LIPASE, AMYLASE in the last 168 hours. No results for input(s): AMMONIA in the last 168 hours. CBC:  Recent Labs Lab 10/28/14 1052 10/30/14 0300  WBC 6.5 6.3  NEUTROABS 3.8  --   HGB 12.3 12.9  HCT 34.9* 37.9  MCV 90.9 90.5  PLT 250 278   Cardiac Enzymes: No results for input(s): CKTOTAL, CKMB, CKMBINDEX, TROPONINI in the last 168 hours. BNP: No results for input(s): PROBNP in the last 168 hours. D-Dimer: No results for input(s): DDIMER in the last 168 hours. CBG:  Recent Labs Lab 10/26/14 1353  GLUCAP 143*   Hemoglobin A1C:  Recent Labs Lab 10/26/14 1710  HGBA1C <4.0*   Fasting Lipid Panel: No results for input(s): CHOL, HDL, LDLCALC, TRIG, CHOLHDL, LDLDIRECT in the last 168 hours. Thyroid Function Tests:  Recent Labs Lab 10/26/14 1710  TSH 0.742   Coagulation: No results for input(s): LABPROT, INR in the last 168 hours. Anemia Panel:  Recent Labs Lab 10/26/14 1710 10/30/14 0300  VITAMINB12 1459*  --   RETICCTPCT  --  4.1*   Urine Drug Screen: Drugs of Abuse     Component Value Date/Time   LABOPIA NONE DETECTED 10/27/2014 1225   COCAINSCRNUR NONE DETECTED 10/27/2014 1225   LABBENZ POSITIVE* 10/27/2014 1225   AMPHETMU NONE DETECTED 10/27/2014 1225   THCU NONE DETECTED 10/27/2014 1225   LABBARB NONE DETECTED 10/27/2014 1225    Alcohol Level: No results for input(s): ETH in the last 168 hours. Urinalysis:  Recent Labs Lab 10/26/14 1404 10/29/14 1730  COLORURINE YELLOW YELLOW  LABSPEC 1.015 1.013  PHURINE 5.5 7.5  GLUCOSEU NEGATIVE NEGATIVE  HGBUR  NEGATIVE NEGATIVE  BILIRUBINUR MODERATE* NEGATIVE  KETONESUR NEGATIVE NEGATIVE  PROTEINUR 100* NEGATIVE  UROBILINOGEN 1.0 1.0  NITRITE NEGATIVE NEGATIVE  LEUKOCYTESUR NEGATIVE NEGATIVE   Misc. Labs:   Micro Results: No results found for this or any previous visit (from the past 240 hour(s)). Studies/Results: Ct Head Wo Contrast  10/28/2014  CLINICAL DATA:  Aphasia. EXAM: CT HEAD WITHOUT CONTRAST TECHNIQUE: Contiguous axial images were obtained from the base of the skull through the vertex without intravenous contrast. COMPARISON:  10/26/2014 FINDINGS: Again noted is the old infarct on the left in the region of the insular cortex and deep white matter, stable. No acute infarction. Mild ex vacuo dilatation of the left lateral ventricle. No hydrocephalus. No hemorrhage or midline shift. Visualized paranasal sinuses and mastoids clear. Orbital soft tissues unremarkable. 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  No acute intracranial abnormality. Electronically Signed   By: Rolm Baptise M.D.   On: 10/28/2014 15:52   Medications: I have reviewed the patient's current medications. Scheduled Meds: . aspirin  81 mg Oral Daily  . atorvastatin  20 mg Oral q1800  . enoxaparin (LOVENOX) injection  40 mg Subcutaneous Q24H  . lacosamide (VIMPAT) IV  100 mg Intravenous Q12H  . levETIRAcetam  1,000 mg Intravenous Q12H  . LORazepam  1 mg Oral Once  . multivitamin with minerals  1 tablet Oral Daily  . pantoprazole  40 mg Oral Daily  . phenytoin (DILANTIN) IV  100 mg Intravenous 3 times per day  . potassium chloride SA  40 mEq Oral Once  . sodium chloride  3 mL Intravenous Q12H  . thiamine IV  200 mg Intravenous Q24H   Continuous Infusions:  PRN Meds:.acetaminophen **OR** acetaminophen, albuterol Assessment/Plan: Principal Problem:   Generalized seizures (HCC) Active Problems:   Essential hypertension   GERD (gastroesophageal reflux disease)   CVA (cerebral vascular accident) ([place])   Hyperlipidemia    Hepatitis C antibody test positive   Seizure ([place])   Malnutrition of moderate degree   Aphasia  Tina Sanders is a 59 yo female with HTN, GERD, h/o seizure d/o, and h/o CVA with residual right sided weakness, presenting with seizure.  Tonic-Clonic Seizure, now with Continuous Simple Partial Seizure: Duration of seizure unknown, but at least 10-15 minutes and requiring Versed to terminate. Patient remains post-ictal approximately 4 hours after the seizure terminated. She has been off anti-epileptics (Keppra) for at least one month. Patient has a h/o seizure,  though the underlying etiology or usual manifestation of her seizure is unknown. Current seizure episode likely due to medication nonadherence, as family has been unable to obtain her medications. Although CT head was negative for acute bleed or stroke, patient remains aphasic. It is possible her aphasia is still her post-ictal phase vs residual effects of Versed vs continued seizure vs stroke.  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  Neuro consult noted concern for continued focal seizure.  She was given another IV Keppra load and daily dose increased to 1000mg  IV BID.  CT brain negative for new stroke.  She does continue to exhibit lower facial tic's c/f continued focal seizure.  She was started on Dilantin IV for added anti-epileptic effect.  Now on Vimpat.  Keppra and Vimpat at max dosing (1500 mg and 200 mg BID, respectively.)  [name] also reports a history of sickle cell disease.  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  She has not appeared in pain, VSS, and oxygen saturations have been 95-100% since admission (though mildly hypoxic on presentation to ED).  - repeat CT brain negative for acute stroke 48 hours post-presentation - ASA 81 daily - Atorva 40 PO - HOLD Lisinopril-HCTZ in setting of possible stroke - SLP: dysphagia 1 - RPR negative - PT/OT - Hgb  electrophoresis: 65% HbS (homozygous HbS), but no signs of sickle crisis or stroke on CT - Keppra 1000 mg IV load and 1500 mg IV BID - Dilantin 100 mg TID - Lacosamide 200 mg load and 200 mg q12hours [ ]  Therapeutic AED monitoring per neuro [ ]  Neuro following, appreciated rec's. Expressive aphasia likely 2/2 focal involvement of left temporal/Broca's area.  MRI has been ordered, though patient has not tolerated MRI in the past.   Aspiration Pneumonitis: Concern for aspiration PNA given patient's hypoxia on arrival to ED and CXR findings, requiring nonrebreather. Patient has been weaned to room air without continued hypoxemia without SIRS.  No antibiotics necessary at this time as it is was likely pneumonitis.  HCV: Antibody elevated.  No known h/o HCV. - HCV RNA quant negative  H/o CVA: Sister believes CVA 1-2 years ago, with right sided weakness improving. At baseline, patient able to ambulate and fluently converse. Although patient records from her previous PCP are still pending, it does not appear she was on ASA therapy. Current concern for new stroke. - ASA - CT brain negative  GERD: Protonix PO HTN: Hold Lisinopril-HCTZ  FEN/GI: - Dysphagia 1 - Protonix IV  DVT Ppx: Lovenox  Dispo: Disposition is deferred at this time, awaiting improvement of current medical problems.  Anticipated discharge in approximately 1-2 day(s).   The patient does have a current PCP ([facility]) and does need an [facility] hospital follow-up appointment after discharge.  The patient does not have transportation limitations that hinder transportation to clinic appointments.  .Services Needed at time of discharge: Y = Yes, Blank = No PT:   OT:   RN:   Equipment:   Other:  [place]    LOS: 4 days   Iline Oven, MD 10/30/2014, 7:53 AM

## 2014-10-30 NOTE — Progress Notes (Signed)
Subjective: patient still having facial twitching involving her right lower face. It has slowed down but still prominent. EEG yesterday " concerning pattern of repetitive and worrisome trains of spike and polyspike and wave discharges at higher frequencies over the left hemisphere were noted, at times preceding and accompanying patient right facial twitching which in the appropriate clinical scenario could be indicative of electrographic seizures"   Objective: Current vital signs: BP 138/81 mmHg  Pulse 96  Temp(Src) 98.1 F (36.7 C) (Oral)  Resp 18  Ht 5\' 3"  (1.6 m)  Wt 47.7 kg (105 lb 2.6 oz)  BMI 18.63 kg/m2  SpO2 95% Vital signs in last 24 hours: Temp:  [98 F (36.7 C)-98.5 F (36.9 C)] 98.1 F (36.7 C) (10/21 0514) Pulse Rate:  [91-97] 96 (10/21 0514) Resp:  [18] 18 (10/21 0514) BP: (124-144)/(69-91) 138/81 mmHg (10/21 0514) SpO2:  [95 %-100 %] 95 % (10/21 0514) Weight:  [47.7 kg (105 lb 2.6 oz)] 47.7 kg (105 lb 2.6 oz) (10/20 1712)  Intake/Output from previous day: 10/20 0701 - 10/21 0700 In: 20 [P.O.:20] Out: -  Intake/Output this shift:   Nutritional status: DIET - DYS 1 Room service appropriate?: Yes; Fluid consistency:: Nectar Thick  Neurologic Exam: General: NAD Mental Status: Alert, follows visual commands but unable to follow verbal commands. Speech is dysarthric and incomprehensible. She is able to state one and two when I hold fingers up.  Cranial Nerves: II: Visual fields grossly normal, pupils equal, round, reactive to light and accommodation III,IV, VI: ptosis not present, extra-ocular motions intact bilaterally V,VII: smile symmetric with rhythmic facial twitching in the right lower face.  facial light touch sensation normal bilaterally VIII: hearing normal bilaterally IX,X: uvula rises symmetrically XI: bilateral shoulder shrug XII: midline tongue extension without atrophy or fasciculations  Motor: Right : Upper extremity   5/5    Left:     Upper  extremity   5/5  Lower extremity   5/5     Lower extremity   5/5 Tone and bulk:normal tone throughout; no atrophy noted Sensory: Pinprick and light touch intact throughout, bilaterally Deep Tendon Reflexes:  2+ thorughout  Plantars: Right: downgoing   Left: downgoing    Lab Results: Basic Metabolic Panel:  Recent Labs Lab 10/26/14 1413  10/26/14 1710 10/27/14 0550 10/29/14 1608 10/30/14 0300  NA 136  --  130* 138 140 138  K 3.9  --  4.0 3.5 3.4* 3.2*  CL 99*  --  99* 106 110 107  CO2  --   --  20* 20* 23 22  GLUCOSE 148*  --  86 76 111* 97  BUN 9  --  8 5* <5* <5*  CREATININE 0.90  --  0.84 0.71 0.70 0.65  CALCIUM  --   < > 8.7* 9.1 9.0 8.7*  MG  --   --  1.7  --   --   --   PHOS  --   --  3.2  --   --   --   < > = values in this interval not displayed.  Liver Function Tests:  Recent Labs Lab 10/26/14 1710 10/29/14 1120  AST 38  --   ALT 20  --   ALKPHOS 63  --   BILITOT 1.0  --   PROT 6.4*  --   ALBUMIN 3.4* 3.4*   No results for input(s): LIPASE, AMYLASE in the last 168 hours. No results for input(s): AMMONIA in the last 168 hours.  CBC:  Recent Labs Lab 10/26/14 1356 10/26/14 1413 10/28/14 1052 10/30/14 0300  WBC 9.4  --  6.5 6.3  NEUTROABS  --   --  3.8  --   HGB 13.9 15.0 12.3 12.9  HCT 39.6 44.0 34.9* 37.9  MCV 90.2  --  90.9 90.5  PLT 301  --  250 278    Cardiac Enzymes: No results for input(s): CKTOTAL, CKMB, CKMBINDEX, TROPONINI in the last 168 hours.  Lipid Panel: No results for input(s): CHOL, TRIG, HDL, CHOLHDL, VLDL, LDLCALC in the last 168 hours.  CBG:  Recent Labs Lab 10/26/14 [place]    Microbiology: No results found for this or any previous visit.  Coagulation Studies: No results for input(s): LABPROT, INR in the last 72 hours.  Imaging: Ct Head Wo Contrast  10/28/2014  CLINICAL DATA:  Aphasia. EXAM: CT HEAD WITHOUT CONTRAST TECHNIQUE: Contiguous axial images were obtained from the base of the skull  through the vertex without intravenous contrast. COMPARISON:  10/26/2014 FINDINGS: Again noted is the old infarct on the left in the region of the insular cortex and deep white matter, stable. No acute infarction. Mild ex vacuo dilatation of the left lateral ventricle. No hydrocephalus. No hemorrhage or midline shift. Visualized paranasal sinuses and mastoids clear. Orbital soft tissues unremarkable. 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  No acute intracranial abnormality. Electronically Signed   By: Rolm Baptise M.D.   On: 10/28/2014 15:52    Medications:  Scheduled: . aspirin  81 mg Oral Daily  . atorvastatin  20 mg Oral q1800  . enoxaparin (LOVENOX) injection  40 mg Subcutaneous Q24H  . lacosamide (VIMPAT) IV  200 mg Intravenous Q12H  . levETIRAcetam  1,500 mg Intravenous Q12H  . LORazepam  1 mg Oral Once  . multivitamin with minerals  1 tablet Oral Daily  . pantoprazole  40 mg Oral Daily  . phenytoin (DILANTIN) IV  100 mg Intravenous 3 times per day  . sodium chloride  3 mL Intravenous Q12H  . thiamine IV  200 mg Intravenous Q24H    Assessment/Plan:  Patient continues to have right facial seizure activity while on Dilantin 100 TID, Keppra 1 Gram BID and Vimpat 100 mg BID. EEG consistent with seizure activity. At this time will max out Keppra at 1500 mg BID and Vimpat at 200 mg BID and continue to monitor.   Etta Quill PA-C [facility] [phone]  10/30/2014, 9:02 AM   I personally participated in this patient's evaluation and management, including formulating above clinical impression and management recommendations.  Rush Farmer M.D. [facility] [phone]

## 2014-10-30 NOTE — Progress Notes (Signed)
Note that patient has been placed on Vimpat 200mg  Q12.. Benefits check completed in the event that patient discharges home on Vimpat. Per patient's sister, patient has Medicaid as secondary, but does not have the card available.  Patient's copay will be $3 under Medicaid.  Lorne Skeens RN, MSN [phone]

## 2014-10-31 ENCOUNTER — Inpatient Hospital Stay (HOSPITAL_COMMUNITY): Payer: Medicare Other

## 2014-10-31 DIAGNOSIS — D571 Sickle-cell disease without crisis: Secondary | ICD-10-CM

## 2014-10-31 DIAGNOSIS — G40109 Localization-related (focal) (partial) symptomatic epilepsy and epileptic syndromes with simple partial seizures, not intractable, without status epilepticus: Secondary | ICD-10-CM

## 2014-10-31 MED ORDER — LACOSAMIDE 50 MG PO TABS
200.0000 mg | ORAL_TABLET | Freq: Two times a day (BID) | ORAL | Status: DC
  Administered 2014-10-31 – 2014-11-01 (×3): 200 mg via ORAL
  Filled 2014-10-31 (×3): qty 4

## 2014-10-31 MED ORDER — ATORVASTATIN CALCIUM 40 MG PO TABS: 40.0000 mg | ORAL_TABLET | Freq: Every day | ORAL | Status: DC

## 2014-10-31 MED ORDER — HALOPERIDOL LACTATE 5 MG/ML IJ SOLN: 1.0000 mg | Freq: Once | INTRAMUSCULAR | Status: DC

## 2014-10-31 MED ORDER — LACOSAMIDE 200 MG PO TABS: 200.0000 mg | ORAL_TABLET | Freq: Two times a day (BID) | ORAL | Status: DC

## 2014-10-31 MED ORDER — LORAZEPAM 1 MG PO TABS
1.0000 mg | ORAL_TABLET | Freq: Once | ORAL | Status: AC
  Administered 2014-10-31: 1 mg via ORAL
  Filled 2014-10-31: qty 1

## 2014-10-31 MED ORDER — ADULT MULTIVITAMIN W/MINERALS CH: 1.0000 | ORAL_TABLET | Freq: Every day | ORAL | Status: DC

## 2014-10-31 MED ORDER — PHENYTOIN 50 MG PO CHEW: 100.0000 mg | CHEWABLE_TABLET | Freq: Three times a day (TID) | ORAL | Status: DC

## 2014-10-31 MED ORDER — LORAZEPAM 2 MG/ML IJ SOLN: 1.0000 mg | Freq: Once | INTRAMUSCULAR | Status: AC

## 2014-10-31 MED ORDER — LEVETIRACETAM 750 MG PO TABS
1500.0000 mg | ORAL_TABLET | Freq: Two times a day (BID) | ORAL | Status: DC
  Administered 2014-10-31 – 2014-11-01 (×3): 1500 mg via ORAL
  Filled 2014-10-31 (×3): qty 2

## 2014-10-31 MED ORDER — PHENYTOIN 50 MG PO CHEW
100.0000 mg | CHEWABLE_TABLET | Freq: Three times a day (TID) | ORAL | Status: DC
  Administered 2014-10-31 – 2014-11-01 (×4): 100 mg via ORAL
  Filled 2014-10-31 (×6): qty 2

## 2014-10-31 MED ORDER — HALOPERIDOL 1 MG PO TABS
0.5000 mg | ORAL_TABLET | Freq: Once | ORAL | Status: AC
  Administered 2014-10-31: 0.5 mg via ORAL
  Filled 2014-10-31: qty 1

## 2014-10-31 MED ORDER — ASPIRIN 81 MG PO CHEW
81.0000 mg | CHEWABLE_TABLET | Freq: Every day | ORAL | Status: DC
Start: 2014-10-31 — End: 2014-12-21

## 2014-10-31 MED ORDER — LISINOPRIL-HYDROCHLOROTHIAZIDE 10-12.5 MG PO TABS
1.0000 | ORAL_TABLET | Freq: Every day | ORAL | Status: DC
Start: 2014-10-31 — End: 2014-12-21

## 2014-10-31 MED ORDER — LEVETIRACETAM 750 MG PO TABS: 1500.0000 mg | ORAL_TABLET | Freq: Two times a day (BID) | ORAL | Status: DC

## 2014-10-31 NOTE — Progress Notes (Signed)
Internal Medicine Attending:   I saw and examined the patient. I reviewed the resident's note and I agree with the resident's findings and plan as documented in the resident's note.  Patient was arousable this morning, interactive and following commands. Expressive aphasia remains unchanged and severe. There is still some twitching of the right lower face which seems somewhat improved compared to yesterday. We spoke with Dr. Nicole Kindred from neurology at the patient's bedside. A continuous partial seizure could explain both the aphasia and the facial twitch. So far 3 agent antiepileptics has not been able to stop the partial seizure. We are going repeat head CT scan today, continue to monitor seizure activity, and Dilantin levels. Likely she might go home tomorrow with the hopes that maximum 3 drug regimen will break the seizure over the next few days to weeks. Risks of escalating AEDs to something like propofol sedation seems to outweigh the benefit at this point.

## 2014-10-31 NOTE — Progress Notes (Signed)
Subjective: [name]. Patient continues to exhibit slight facial twitching.  She remains expressive aphasic.  She responds appropriately to commands.  Objective: Vital signs in last 24 hours: Filed Vitals:   10/30/14 2128 10/31/14 0116 10/31/14 0511 10/31/14 0934  BP: 137/85 139/92 118/67 132/74  Pulse: 97 91 91 93  Temp: 98.3 F (36.8 C) 98.4 F (36.9 C) 98.3 F (36.8 C) 98.1 F (36.7 C)  TempSrc: Oral Oral Oral Oral  Resp: 17 18 18 18   Height:      Weight:      SpO2: 100% 96% 96% 98%   Weight change:   Intake/Output Summary (Last 24 hours) at 10/31/14 1015 Last data filed at 10/31/14 1006  Gross per 24 hour  Intake    603 ml  Output      0 ml  Net    603 ml   Physical Exam  Constitutional:  Thin female, sleeping in bed, NAD.   HENT:  Head: Normocephalic and atraumatic.  Eyes: EOM are normal.  Cardiovascular: Normal rate, regular rhythm, normal heart sounds and intact distal pulses.   Pulmonary/Chest:  Poor inspiratory effort. No wheezes or crackles appreciated.  Abdominal: Soft. She exhibits no distension. There is no tenderness. There is no rebound and no guarding.  Musculoskeletal: She exhibits no edema.  Neurological: She is alert.  EOMI. CN VII intact and symmetric.   Spontaneous movement of all extremities.  Unable to repeat sentences.  Tongue projects to the midline.  Skin: Skin is warm and dry. No rash noted.    Lab Results: Basic Metabolic Panel:  Recent Labs Lab 10/26/14 1710  10/29/14 1608 10/30/14 0300  NA 130*  < > 140 138  K 4.0  < > 3.4* 3.2*  CL 99*  < > 110 107  CO2 20*  < > 23 22  GLUCOSE 86  < > 111* 97  BUN 8  < > <5* <5*  CREATININE 0.84  < > 0.70 0.65  CALCIUM 8.7*  < > 9.0 8.7*  MG 1.7  --   --   --   PHOS 3.2  --   --   --   < > = values in this interval not displayed. Liver Function Tests:  Recent Labs Lab 10/26/14 1710 10/29/14 1120 10/30/14 1013  AST 38  --  24  ALT 20  --  13*  ALKPHOS 63  --  54  BILITOT 1.0  --   1.2  PROT 6.4*  --  6.7  ALBUMIN 3.4* 3.4* 3.3*   No results for input(s): LIPASE, AMYLASE in the last 168 hours. No results for input(s): AMMONIA in the last 168 hours. CBC:  Recent Labs Lab 10/28/14 1052 10/30/14 0300  WBC 6.5 6.3  NEUTROABS 3.8  --   HGB 12.3 12.9  HCT 34.9* 37.9  MCV 90.9 90.5  PLT 250 278   Cardiac Enzymes: No results for input(s): CKTOTAL, CKMB, CKMBINDEX, TROPONINI in the last 168 hours. BNP: No results for input(s): PROBNP in the last 168 hours. D-Dimer: No results for input(s): DDIMER in the last 168 hours. CBG:  Recent Labs Lab 10/26/14 1353  GLUCAP 143*   Hemoglobin A1C:  Recent Labs Lab 10/26/14 1710  HGBA1C <4.0*   Fasting Lipid Panel: No results for input(s): CHOL, HDL, LDLCALC, TRIG, CHOLHDL, LDLDIRECT in the last 168 hours. Thyroid Function Tests:  Recent Labs Lab 10/26/14 1710  TSH 0.742   Coagulation: No results for input(s): LABPROT, INR in the last 168  hours. Anemia Panel:  Recent Labs Lab 10/26/14 1710 10/30/14 0300  VITAMINB12 1459*  --   RETICCTPCT  --  4.1*   Urine Drug Screen: Drugs of Abuse     Component Value Date/Time   LABOPIA NONE DETECTED 10/27/2014 1225   COCAINSCRNUR NONE DETECTED 10/27/2014 1225   LABBENZ POSITIVE* 10/27/2014 1225   AMPHETMU NONE DETECTED 10/27/2014 1225   THCU NONE DETECTED 10/27/2014 1225   LABBARB NONE DETECTED 10/27/2014 1225    Alcohol Level: No results for input(s): ETH in the last 168 hours. Urinalysis:  Recent Labs Lab 10/26/14 1404 10/29/14 1730  COLORURINE YELLOW YELLOW  LABSPEC 1.015 1.013  PHURINE 5.5 7.5  GLUCOSEU NEGATIVE NEGATIVE  HGBUR NEGATIVE NEGATIVE  BILIRUBINUR MODERATE* NEGATIVE  KETONESUR NEGATIVE NEGATIVE  PROTEINUR 100* NEGATIVE  UROBILINOGEN 1.0 1.0  NITRITE NEGATIVE NEGATIVE  LEUKOCYTESUR NEGATIVE NEGATIVE   Misc. Labs:   Micro Results: No results found for this or any previous visit (from the past 240  hour(s)). Studies/Results: No results found. Medications: I have reviewed the patient's current medications. Scheduled Meds: . aspirin  81 mg Oral Daily  . atorvastatin  40 mg Oral q1800  . enoxaparin (LOVENOX) injection  40 mg Subcutaneous Q24H  . lacosamide  200 mg Oral BID  . levETIRAcetam  1,500 mg Oral BID  . LORazepam  1 mg Oral Once  . multivitamin with minerals  1 tablet Oral Daily  . pantoprazole  40 mg Oral Daily  . phenytoin  100 mg Oral TID  . sodium chloride  3 mL Intravenous Q12H   Continuous Infusions:  PRN Meds:.acetaminophen **OR** acetaminophen, albuterol Assessment/Plan: Principal Problem:   Focal and partial seizures (HCC) Active Problems:   Generalized seizures (HCC)   CVA (cerebral vascular accident) ([place])   Hepatitis C antibody test positive   Malnutrition of moderate degree   Aphasia   Sickle cell disease ([place])   Seizure ([place])  Ms. Marquard is a 59 yo female with HTN, GERD, h/o seizure d/o, and h/o CVA with residual right sided weakness, presenting with seizure.  Tonic-Clonic Seizure, now with Continuous Simple Partial Seizure: Duration of seizure unknown, but at least 10-15 minutes and requiring Versed to terminate. Patient remains post-ictal approximately 4 hours after the seizure terminated. She has been off anti-epileptics (Keppra) for at least one month. Patient has a h/o seizure, though the underlying etiology or usual manifestation of her seizure is unknown. Current seizure episode likely due to medication nonadherence, as family has been unable to obtain her medications. Although CT head was negative for acute bleed or stroke, patient remains aphasic.Neuro consult noted concern for continued focal seizure.  She was given another IV Keppra load and daily dose increased to 1000mg  IV BID.  Repeat CT brain negative for new stroke.  She does continue to exhibit lower facial tic's c/f continued focal seizure.  She was started on Dilantin IV for added  anti-epileptic effect.  Now on Vimpat.  Keppra and Vimpat at max dosing (1500 mg and 200 mg BID, respectively.)  Repeat EEG concerning for focal left temporal seizure. If repeat CT head remains negative for acute stroke, patient will likely be discharged tomorrow with neuro follow up.  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 Expressive aphasia likely 2/2 focal involvement of left temporal/Broca's area.    HCV: Antibody elevated.  No known h/o HCV. - HCV RNA quant negative  H/o CVA: Sister believes CVA 1-2 years ago, with right sided weakness improving. At baseline, patient able to ambulate and fluently converse. Although patient records from her previous PCP are still pending, it does not appear she was on ASA therapy. Current concern for new stroke. - ASA - Repeat CT brain negative 10/19 [ ]  Repeat CT brain  Sickle Cell Disease: [name] also reports a history of sickle cell disease.  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  She has not appeared in pain, VSS, and oxygen saturations have been 95-100% since admission (though mildly hypoxic on presentation to ED).   - CBC - LDH  Aspiration Pneumonitis: Concern for aspiration PNA given patient's hypoxia on arrival to ED and CXR findings, requiring nonrebreather. Patient has been weaned to room air without continued hypoxemia without SIRS.  No antibiotics necessary at this time as it is was likely pneumonitis.  GERD: Protonix PO HTN: Hold Lisinopril-HCTZ  FEN/GI: -  Dysphagia 1 - Protonix IV  DVT Ppx: Lovenox  Dispo: Disposition is deferred at this time, awaiting improvement of current medical problems.  Anticipated discharge in approximately 1-2 day(s).   The patient does have a current PCP ([facility]) and does need an [facility] hospital follow-up appointment after discharge.  The patient does not have transportation limitations that hinder transportation to clinic appointments.  .Services Needed at time of discharge: Y = Yes, Blank = No PT:   OT:   RN:   Equipment:   Other:  [place]    LOS: 5 days   Iline Oven, MD 10/31/2014, 10:15 AM

## 2014-10-31 NOTE — Care Management Note (Signed)
Case Management Note  Patient Details  Name: Tina Sanders MRN: [id number] Date of Birth: [birth year]  Subjective/Objective:                   l right sided weakness, presenting with seizure Action/Plan: Discharge planning  Expected Discharge Date:  11/01/14               Expected Discharge Plan:  [place]  In-House Referral:     Discharge planning Services  CM Consult  Post Acute Care Choice:    Choice offered to:  Sibling, Patient  DME Arranged:    DME Agency:  [place] Arranged:  RN, PT, OT, Speech Therapy [place] Agency:  [facility]  Status of Service:  Completed, signed off  Medicare Important Message Given:  Yes-second notification given Date Medicare IM Given:    Medicare IM give by:    Date Additional Medicare IM Given:    Additional Medicare Important Message give by:     If discussed at [place] of Stay Meetings, dates discussed:    Additional Comments: CM met with pt and sister in room over concern they do not have a Medicaid for the E-scribed prescriptions sent to their pharmacy.  CM gave my contact number to call me if they have a problem in getting medications though they state their pharmacy states "no problem."  CM called [facility] rep, Tiffany to notify of discharge tomorrow.  No other CM needs were communicated. Dellie Catholic, RN 10/31/2014, 11:31 AM

## 2014-10-31 NOTE — Progress Notes (Signed)
Subjective: Patient continues to have occasional slight twitches involving right side of her mouth and chin, as well as marked expressive aphasia.  Objective: Current vital signs: BP 118/67 mmHg  Pulse 91  Temp(Src) 98.3 F (36.8 C) (Oral)  Resp 18  Ht 5\' 3"  (1.6 m)  Wt 47.7 kg (105 lb 2.6 oz)  BMI 18.63 kg/m2  SpO2 96%  Neurologic Exam: Patient was alert and in no acute distress. She was able to follow verbal commands readily. Speech output consisted of perseveration with a 1 syllable responses. Extraocular movements were full and conjugate. Slight right lower facial weakness was noted, in addition to intermittent right lower facial twitching. Severe right hemiparesis unchanged.  Medications: I have reviewed the patient's current medications.  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 Frequency and severity of seizure activity have improved, however. She does not appear to be having significant side effects from anticonvulsant medications.  Recommendations: 1. No change in current doses of Keppra and Vimpat 2. Pharmacy consultation for management of Dilantin 3. Agree with obtaining repeat CT scan of her head to rule out recurrent left MCA territory acute stroke  We will continue to follow this patient with you.  C.R. Nicole Kindred, MD [facility] [phone]  10/31/2014  9:10 AM

## 2014-11-01 ENCOUNTER — Inpatient Hospital Stay (HOSPITAL_COMMUNITY): Payer: Medicare Other

## 2014-11-01 DIAGNOSIS — G40109 Localization-related (focal) (partial) symptomatic epilepsy and epileptic syndromes with simple partial seizures, not intractable, without status epilepticus: Principal | ICD-10-CM

## 2014-11-01 LAB — CBC
HCT: 40.6 % (ref 36.0–46.0)
Hemoglobin: 13.8 g/dL (ref 12.0–15.0)
MCH: 31.2 pg (ref 26.0–34.0)
MCHC: 34 g/dL (ref 30.0–36.0)
MCV: 91.9 fL (ref 78.0–100.0)
PLATELETS: 314 10*3/uL (ref 150–400)
RBC: 4.42 MIL/uL (ref 3.87–5.11)
RDW: 13.4 % (ref 11.5–15.5)
WBC: 7 10*3/uL (ref 4.0–10.5)

## 2014-11-01 LAB — COMPREHENSIVE METABOLIC PANEL
ALBUMIN: 3.7 g/dL (ref 3.5–5.0)
ALK PHOS: 71 U/L (ref 38–126)
ALT: 12 U/L — AB (ref 14–54)
AST: 23 U/L (ref 15–41)
Anion gap: 9 (ref 5–15)
BUN: 5 mg/dL — AB (ref 6–20)
CALCIUM: 9.7 mg/dL (ref 8.9–10.3)
CHLORIDE: 109 mmol/L (ref 101–111)
CO2: 25 mmol/L (ref 22–32)
CREATININE: 0.99 mg/dL (ref 0.44–1.00)
GFR calc non Af Amer: >60 mL/min (ref >60)
GLUCOSE: 111 mg/dL — AB (ref 65–99)
Potassium: 4.3 mmol/L (ref 3.5–5.1)
SODIUM: 143 mmol/L (ref 135–145)
Total Bilirubin: 0.7 mg/dL (ref 0.3–1.2)
Total Protein: 7.9 g/dL (ref 6.5–8.1)

## 2014-11-01 LAB — LACTATE DEHYDROGENASE: LDH: 140 U/L (ref 98–192)

## 2014-11-01 MED ORDER — LORAZEPAM 1 MG PO TABS
1.0000 mg | ORAL_TABLET | Freq: Once | ORAL | Status: DC
  Filled 2014-11-01: qty 1

## 2014-11-01 MED ORDER — HALOPERIDOL LACTATE 5 MG/ML IJ SOLN
1.0000 mg | Freq: Once | INTRAMUSCULAR | Status: AC
  Administered 2014-11-01: 1 mg via INTRAMUSCULAR
  Filled 2014-11-01: qty 1

## 2014-11-01 NOTE — Progress Notes (Signed)
Subjective: VSS.  Patient continues to exhibit slight facial twitching.  She remains expressive aphasic.  She did have an episode last night of increased agitation which is likely delirium.  She received a dose of haldol and the episode resolved.  She is sleeping this AM when we enter the room.     Objective: Vital signs in last 24 hours: Filed Vitals:   10/31/14 1342 10/31/14 1800 10/31/14 2119 11/01/14 0122  BP:  126/86 128/79 130/70  Pulse:  94 90 93  Temp:  98.7 F (37.1 C) 97.9 F (36.6 C) 98.2 F (36.8 C)  TempSrc: Other (Comment) Oral Oral Oral  Resp:  16 18 18   Height:      Weight:      SpO2:  98% 98% 98%   Weight change:   Intake/Output Summary (Last 24 hours) at 11/01/14 0741 Last data filed at 10/31/14 1006  Gross per 24 hour  Intake    303 ml  Output      0 ml  Net    303 ml   Physical Exam  Constitutional:  Thin female, sleeping in bed, NAD.   HENT:  Head: Normocephalic and atraumatic.  Eyes: EOM are normal.  Cardiovascular: Normal rate, regular rhythm, normal heart sounds and intact distal pulses.   Pulmonary/Chest:  Poor inspiratory effort. No wheezes or crackles appreciated.  Abdominal: Soft. She exhibits no distension. There is no tenderness. There is no rebound and no guarding.  Musculoskeletal: She exhibits no edema.  Neurological: She is alert.  EOMI. CN VII intact and symmetric.   Spontaneous movement of all extremities.  Unable to repeat sentences.  Tongue projects to the midline.  Skin: Skin is warm and dry. No rash noted.    Lab Results: Basic Metabolic Panel:  Recent Labs Lab 10/26/14 1710  10/30/14 0300 11/01/14 0549  NA 130*  < > 138 143  K 4.0  < > 3.2* 4.3  CL 99*  < > 107 109  CO2 20*  < > 22 25  GLUCOSE 86  < > 97 111*  BUN 8  < > <5* 5*  CREATININE 0.84  < > 0.65 0.99  CALCIUM 8.7*  < > 8.7* 9.7  MG 1.7  --   --   --   PHOS 3.2  --   --   --   < > = values in this interval not displayed. Liver Function  Tests:  Recent Labs Lab 10/30/14 1013 11/01/14 0549  AST 24 23  ALT 13* 12*  ALKPHOS 54 71  BILITOT 1.2 0.7  PROT 6.7 7.9  ALBUMIN 3.3* 3.7   No results for input(s): LIPASE, AMYLASE in the last 168 hours. No results for input(s): AMMONIA in the last 168 hours. CBC:  Recent Labs Lab 10/28/14 1052 10/30/14 0300 11/01/14 0549  WBC 6.5 6.3 7.0  NEUTROABS 3.8  --   --   HGB 12.3 12.9 13.8  HCT 34.9* 37.9 40.6  MCV 90.9 90.5 91.9  PLT 250 278 314   Cardiac Enzymes: No results for input(s): CKTOTAL, CKMB, CKMBINDEX, TROPONINI in the last 168 hours. BNP: No results for input(s): PROBNP in the last 168 hours. D-Dimer: No results for input(s): DDIMER in the last 168 hours. CBG:  Recent Labs Lab 10/26/14 1353  GLUCAP 143*   Hemoglobin A1C:  Recent Labs Lab 10/26/14 1710  HGBA1C <4.0*   Fasting Lipid Panel: No results for input(s): CHOL, HDL, LDLCALC, TRIG, CHOLHDL, LDLDIRECT in the last 168 hours.  Thyroid Function Tests:  Recent Labs Lab 10/26/14 1710  TSH 0.742   Coagulation: No results for input(s): LABPROT, INR in the last 168 hours. Anemia Panel:  Recent Labs Lab 10/26/14 1710 10/30/14 0300  VITAMINB12 1459*  --   RETICCTPCT  --  4.1*   Urine Drug Screen: Drugs of Abuse     Component Value Date/Time   LABOPIA NONE DETECTED 10/27/2014 1225   COCAINSCRNUR NONE DETECTED 10/27/2014 1225   LABBENZ POSITIVE* 10/27/2014 1225   AMPHETMU NONE DETECTED 10/27/2014 1225   THCU NONE DETECTED 10/27/2014 1225   LABBARB NONE DETECTED 10/27/2014 1225    Alcohol Level: No results for input(s): ETH in the last 168 hours. Urinalysis:  Recent Labs Lab 10/26/14 1404 10/29/14 1730  COLORURINE YELLOW YELLOW  LABSPEC 1.015 1.013  PHURINE 5.5 7.5  GLUCOSEU NEGATIVE NEGATIVE  HGBUR NEGATIVE NEGATIVE  BILIRUBINUR MODERATE* NEGATIVE  KETONESUR NEGATIVE NEGATIVE  PROTEINUR 100* NEGATIVE  UROBILINOGEN 1.0 1.0  NITRITE NEGATIVE NEGATIVE  LEUKOCYTESUR  NEGATIVE NEGATIVE   Misc. Labs:   Micro Results: No results found for this or any previous visit (from the past 240 hour(s)). Studies/Results: Ct Head Wo Contrast  10/31/2014  CLINICAL DATA:  Aphasia, unresponsive, history of seizures stroke EXAM: CT HEAD WITHOUT CONTRAST TECHNIQUE: Contiguous axial images were obtained from the base of the skull through the vertex without intravenous contrast. COMPARISON:  10/28/2014 FINDINGS: Motion degraded images. No evidence of parenchymal hemorrhage or extra-axial fluid collection. No mass lesion, mass effect, or midline shift. No CT evidence of acute infarction. Cephalexin changes related to old left MCA division infarct. Subcortical white matter and periventricular small vessel ischemic changes. Intracranial atherosclerosis. Global cortical atrophy.  No ventriculomegaly. The visualized paranasal sinuses are essentially clear. The mastoid air cells are unopacified. No evidence of calvarial fracture. IMPRESSION: No evidence of acute intracranial abnormality. Encephalomalacic changes related to old left MCA distribution infarct. Atrophy with small vessel ischemic changes. Electronically Signed   By: Julian Hy M.D.   On: 10/31/2014 13:49   Medications: I have reviewed the patient's current medications. Scheduled Meds: . aspirin  81 mg Oral Daily  . atorvastatin  40 mg Oral q1800  . enoxaparin (LOVENOX) injection  40 mg Subcutaneous Q24H  . lacosamide  200 mg Oral BID  . levETIRAcetam  1,500 mg Oral BID  . multivitamin with minerals  1 tablet Oral Daily  . pantoprazole  40 mg Oral Daily  . phenytoin  100 mg Oral TID  . sodium chloride  3 mL Intravenous Q12H   Continuous Infusions:  PRN Meds:.acetaminophen **OR** acetaminophen, albuterol Assessment/Plan: Principal Problem:   Focal and partial seizures (HCC) Active Problems:   Generalized seizures (HCC)   CVA (cerebral vascular accident) ([place])   Hepatitis C antibody test positive    Malnutrition of moderate degree   Aphasia   Sickle cell disease (HCC)  Tonic-Clonic Seizure, now with Continuous Simple Partial Seizure Current seizure episode likely due to medication nonadherence, as family has been unable to obtain her medications. Although CT head was negative for acute bleed or stroke, patient remains aphasic.Neuro consult noted concern for continued focal seizure.  She was given another IV Keppra load and daily dose increased to 1000mg  IV BID.  Repeat CT brain negative for new stroke.  She does continue to exhibit lower facial tic's c/f continued focal seizure.  She was started on Dilantin IV for added anti-epileptic effect.  Now on Vimpat.  Keppra and Vimpat at max dosing (1500 mg and 200 mg  BID, respectively.)  Repeat EEG concerning for focal left temporal seizure.  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  Hgb electrophoresis confirms pt HbS.   -cont to mont -consider referral to sickle cell clinic as outpatient   FEN -Dysphagia 1  Dispo Discharge home today with [facility] in the care of her sister.  We will arrange close f/u with our clinic.    The patient does have a current PCP ([facility], [facility]) and does need an [facility] hospital follow-up appointment after discharge.    LOS: 6 days   Jones Bales, MD 11/01/2014, 7:41 AM

## 2014-11-01 NOTE — Progress Notes (Signed)
Internal Medicine Attending:   I saw and examined the patient. I reviewed the resident's note and I agree with the resident's findings and plan as documented in the resident's note.  Symptomatically the patient is stable. Remains with expressive aphasia today. There is still a small right-sided facial twitch consistent with intractable partial seizure. Currently doing well on 3 antiepileptic drugs without significant side effects. Plan will be to continue these medications long-term with the hope that they will take effect and break the seizure over the next days to weeks. Patient wants to go home soon, sister is willing to provide care for her at home and we can arrange for home health. I think this is appropriate and we talked about this with Dr. Juleen China from neurology yesterday. Escalating antiepileptic drugs from here would involve high risk medications like propofol which probably are more risk than benefit at this point. We will ensure the patient has follow-up in the neurology clinic soon and is able to obtain all her medications.

## 2014-11-01 NOTE — Progress Notes (Addendum)
14:59 CM called [employer] with Group Number [id number] and requested BIN# for prescription coverage.  SS rep states this needs to be requested by the pharmacist.  CM called the pharmacist who made the call to SS and received the BIN#.  Cm called sister, Tina who states she has the medications and SS covered the cost.  No other Cm needs were communicated. 12:50 ASST Director calls to notify me pt has Medication coverage through [employer] [phone].  Tina states she was unaware of this insurance and pt not an accurate historian.  CM calls SS for verification.  SS states pt is covered.  CM calls [facility] and gives them coverage Group number.  Pharmacy states they need BIN#.  11:51 CM called [facility] pharmacy ([place]) and gave pharmacist MEDICAID NUMBER to run for prescriptions.  Pharmacist states system reveals no coverage by Medicaid bc pt has other insurance.  Both pt and sister deny any other insurance.  Pharmacist states pt can purchase a couple of pills to get her to tomorrow and then call the Medicaid caseworker to have caseworker delete erroneous information so MEDICAID can pick up cost of medications.  CM met with pt and pt's sister, Tina Sanders, who has letter of MEDICAID coverage and explained what the pharmacist states.  CM has placed copy in shadow chart. Tina will bring the letter to pharmacy where it clearly states coverage and will call caseworker IVA HEYWARD [phone] to correct information.   No other CM needs were communicated.  CM received call from sister, Tina,  of pt who states pharmacy says pt does not have MEDICAID and she cannot afford medications. Pt and sister state they have a letter of approval.   [facility] pharmacy opens at 10:00.  CM will call and see if pharmacy will bridge pt until MEDICAID if sister brings in letter of approval.  Pt is NOT eligible for MATCH as she already has a federally funded insurance (Medicare A&B).    

## 2014-11-01 NOTE — Discharge Instructions (Signed)
Please keep your follow-up appointments; this is very important for your continued recovery.    We have made the following additions/changes to your medications:  Please refer to your medication list.   You will need to follow up in our internal medicine clinic this week.    Please continue to take all of your medications as prescribed.  Do not miss any doses without contacting your primary physician.  If you have questions, please contact your physician or contact the Internal Medicine Teaching Service at [phone].  Please bring your medicications with you to your appointments; medications may be eye drops, herbals, vitamins, or pills.    If you believe you are suffering from a life-threatening emergency, go to the nearest Emergency Department.      Liz [employer]iborne Guide  1) Find a Charity fundraiser Although you won't have to find out who is covered by [employer] plan, it is a good idea to ask around and get recommendations. You will then need to call the office and see if the doctor you have chosen will accept you as a new patient and what types of options they offer for patients who are self-pay. Some doctors offer discounts or will set up payment plans for their patients who do not have insurance, but you will need to ask so you aren't surprised when you get to your appointment.  2) Contact Your Local Health Department Not all health departments have doctors that can see patients for sick visits, but many do, so it is worth a call to see if yours does. If you don't know where your local health department is, you can check in your phone book. The CDC also has a tool to help you locate your state's health department, and many state websites also have listings of all of their local health departments.  3) Find a [facility] If your illness is not likely to be very severe or complicated, you may want to try a walk in clinic. These are popping up all over the country in  pharmacies, drugstores, and shopping centers. They're usually staffed by nurse practitioners or physician assistants that have been trained to treat common illnesses and complaints. They're usually fairly quick and inexpensive. However, if you have serious medical issues or chronic medical problems, these are probably not your best option.  No Primary Care Doctor: - Call Health Connect at  [phone] - they can help you locate a primary care doctor that  accepts your insurance, provides certain services, etc. - Physician Referral Service- [phone]  Chronic Pain Problems: Organization         Address  Phone   Notes  [facility]  [phone] Patients need to be referred by their primary care doctor.   Medication Assistance: Organization         Address  Phone   Notes  [facility] Medication [facility] [address] [place], [place] [address] [phone] --Must be a resident of [facility] -- Must have NO insurance coverage whatsoever (no Medicaid/ Medicare, etc.) -- The pt. MUST have a primary care doctor that directs their care regularly and follows them in the community   MedAssist  [phone]   [employer]  [phone]    Agencies that provide inexpensive medical care: Organization         Address  Phone   Notes  [place]  [phone]   Zacarias Pontes  Internal Medicine    [phone]   [facility] [place], [place] [address] [phone]   [place] [address], [place] [phone]   Planned Parenthood    [phone]   [facility]    [phone]   [place] and [place] [address], [place] Phone:  [phone], Fax:  [phone] Hours of Operation:  9 am - 6 pm, M-F.  Also accepts Medicaid/Medicare and self-pay.  [facility] for [place], [address],  [place] Phone: [phone], Fax: [phone]. Hours of Operation:  8:30 am - 5:30 pm, M-F.  Also accepts Medicaid and self-pay.  [facility] [place] [address], [place] Phone: [phone]   [place], [place], [place] [phone], Ext. [phone] Mondays & Thursdays: 7-9 AM.  First 15 patients are seen on a first come, first serve basis.    [place] Providers:  Organization         Address  Phone   Notes  [facility] [address], [place] [phone] Also accepts self-pay patients.  [facility] [address], [place]  [phone]   [place], [address], [place] [phone]   [facility] Family [facility] [address], [place] [phone]   Lucianne Lei [address], [place]   [phone] Only accepts [place] Access [place] patients after they have their name applied to their card.   Self-Pay (no insurance) in [facility]:  Organization         Address  Phone   Notes  Sickle Cell Patients, [facility] Internal Medicine [place] [phone]   [facility] Urgent Care [place] [phone]   [facility] Pontes [facility] [place]  [place], [place], [place] [phone]   [facility]/Dr. Osei-Bonsu  [address] [place] or [address], [place] [phone] Phone number for both [place] and [place] [employer] locations is the same.  Urgent Medical and [facility] [address] [place] [phone]   [facility] [address], [place] or [address] [phone]   [facility] [address] [place] [phone], phone; [phone], fax Sees patients 1st and 3rd Saturday of every month.  Must not  qualify for public or private insurance (i.e. Medicaid, Medicare, [place] Health Choice, Veterans' Benefits)  Household income should be no more than 200% of the poverty level The clinic cannot treat you if you are pregnant or think you are pregnant  Sexually transmitted diseases are not treated at the clinic.    Dental Care: Organization         Address  Phone  Notes  [facility] Department of [facility] [place] [phone] Accepts children up to age 45 who are enrolled in [place] or [place]; pregnant women with a Medicaid card; and children who have applied for Medicaid or [place] Health Choice, but were declined, whose parents can pay a reduced fee at time of service.  [facility] Department of [place]  [address], [employer] (  [phone] Accepts children up to age 32 who are enrolled in Medicaid or [place]; pregnant women with a Medicaid card; and children who have applied for Medicaid or [place] Health Choice, but were declined, whose parents can pay a reduced fee at time of service.  [place] Adult Dental Access PROGRAM  [place] [phone] Patients are seen by appointment only. Walk-ins are not accepted. [place] will see patients 60 years of age and older. Monday - Tuesday (8am-5pm) Most Wednesdays (8:30-5pm) $30 per visit, cash only  [facility] Adult Dental Access PROGRAM  [address], [facility] [phone] Patients are seen by appointment only. Walk-ins are not accepted. [place] will see patients 85 years of age and older. One Wednesday Evening (Monthly: Volunteer Based).  $30 per visit, cash only  [place]  [phone] for adults; Children under age 50, call Graduate Pediatric Dentistry at [phone]. Children aged 48-14, please call [phone] to request a pediatric application.  Dental services are provided  in all areas of dental care including fillings, crowns and bridges, complete and partial dentures, implants, gum treatment, root canals, and extractions. Preventive care is also provided. Treatment is provided to both adults and children. Patients are selected via a lottery and there is often a waiting list.   [place] [address], [place]  [phone] [email or web address]   [facility] [address] [place], [place] [phone], Ext. [phone] Second and Fourth Thursday of each month, opens at 6:30 AM; Clinic ends at 9 AM.  Patients are seen on a first-come first-served basis, and a limited number are seen during each clinic.   [facility]  [address] Hillard Danker [place], [place] [phone]   Eligibility Requirements You must have lived in [place], [place], or [place] counties for at least the last three months.   You cannot be eligible for state or federal sponsored [employer], including [employer], [place], or [employer].   You generally cannot be eligible for healthcare insurance through your employer.    How to apply: Eligibility screenings are held every Tuesday and Wednesday afternoon from 1:00 pm until 4:00 pm. You do not need an appointment for the interview!  [facility] [address] [place], [place]   [place]  [place] Department  [place]  [phone]    Behavioral Health Resources in the Community: Intensive Outpatient Programs Organization         Address  Phone  Notes  [place]. [address], [place], [place] [phone]   [facility] Outpatient [address], [place], [place]   ADS: Alcohol & Drug Svcs [address] [place], [place]   [place] [address],  [place], [place] or [phone]   Substance Abuse Resources Organization         Address  Phone  Notes  Alcohol and Drug Services  [phone]   [place]  [phone]   [facility] [place]   Chinita Pester  [phone]   Residential & Outpatient Substance Abuse Program  [phone]   Psychological Services Organization         Address  Phone  Notes  [place]  [place]  [phone]  [place] [address] [place] or [phone]    Mobile Crisis Teams Organization         Address  Phone  Notes  [facility], Mobile Crisis Care Unit  [phone]   [facility] Psychotherapeutic Services  [address] [place], [place]   [place] [address] [place] [phone]    Self-Help/Support Groups Organization         Address  Phone             Notes  [place]. of [place] - variety of support groups  [place] Call for more information  Narcotics Anonymous (NA), [facility] [address], [employer] [place]  2 meetings at this location   Special educational needs teacher         Address  Phone  Notes  ASAP Residential Treatment [place],    [place]  [phone]   [facility]  [address], [place] 759163, [place], [place]   [place], [place] [phone] Admissions: 8am-3pm M-F  Incentives Substance [place] [address]    [place], [place] [phone]   [facility] [address] [place], [place], [place]   [facility] [address]  [place], [place]   [facility] Programs - Intensive Outpatient [address] Kristeen [address], [place], [place]   [facility] ([place].,  [place], [place] [phone] or  [phone]   [facility]idential Treatment Services (RTS) [address], [place], [place] Accepts Medicaid  [facility] [place] [address],  [place] [phone] Substance Abuse/Addiction Treatment   [facility] Organization         Address  Phone  Notes  [facility]  [phone]   Domenic Schwab, PhD [address] Arlis [place], [place]   [phone] or [phone]   [place], [place] [phone]   [facility] [address] [place], [place] [phone] Insurance/Medicaid/sponsorship through [facility] and Families [address]                                    [place], [place] [phone] [place] [address], [place] [phone]    Dr. Adele Schilder  [phone]   [facility] [place] Dept. 1) [address], [place] 2) [place] 3)  [place] [address], [place] [phone]  [phone]   [place] [phone] or [phone] (After Hours)

## 2014-11-01 NOTE — Progress Notes (Signed)
D/C orders received, pt for D/C home today with home health.  IV and telemetry D/C.  Rx and D/C instructions given with verbalized understanding.  Family at bedside to assist with D/C.  Staff brought pt downstairs via wheelchair.  

## 2014-11-01 NOTE — Progress Notes (Signed)
Subjective: Continued difficulty speaking, no noted facial twitching at this time. Tolerating AEDs well. Repeat CT head shows no acute infarct, notable for old left MCA infarct.   Objective: Current vital signs: BP 130/70 mmHg  Pulse 93  Temp(Src) 98.2 F (36.8 C) (Oral)  Resp 18  Ht 5\' 3"  (1.6 m)  Wt 47.7 kg (105 lb 2.6 oz)  BMI 18.63 kg/m2  SpO2 98%  Neurologic Exam: Patient was alert and in no acute distress. She was able to follow verbal commands readily. Speech output consisted of perseveration with a 1 syllable responses. Extraocular movements were full and conjugate. Slight right lower facial weakness was noted, no facial twitching noted. Severe right hemiparesis unchanged.  Medications: I have reviewed the patient's current medications.  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 Frequency and severity of seizure activity have improved, currently no noted seizure activity. Unable to tolerate MRI, repeat head CT shows no new infarct.   Recommendations: 1. No change in current doses of Keppra and Vimpat 2. Pharmacy consultation for management of Dilantin  We will continue to follow this patient with you.   Jim Like, DO [facility] [phone]  If 7pm- 7am, please page neurology on call as listed in [place].   11/01/2014  7:50 AM

## 2014-11-02 LAB — HEPATITIS C GENOTYPE

## 2014-11-02 NOTE — Discharge Summary (Signed)
Name: Tina Sanders MRN: [id number] DOB: February 07, 1955 59 y.o. PCP: No primary care provider on file.  Date of Admission: 10/26/2014  1:18 PM Date of Discharge: 11/02/2014 Attending Physician: No att. providers found  Discharge Diagnosis: 1. Generalized tonic-clonic seizure with simple partial continuous seizure 2. H/o CVA   Principal Problem:   Focal and partial seizures (HCC) Active Problems:   Generalized seizures (HCC)   CVA (cerebral vascular accident) ([place])   Hepatitis C antibody test positive   Malnutrition of moderate degree   Aphasia   Sickle cell disease ([place])  Discharge Medications:   Medication List    TAKE these medications        albuterol (5 MG/ML) 0.5% nebulizer solution  Commonly known as:  PROVENTIL  Take 0.5 mLs (2.5 mg total) by nebulization every 6 (six) hours as needed for wheezing or shortness of breath.     aspirin 81 MG chewable tablet  Chew 1 tablet (81 mg total) by mouth daily.     atorvastatin 40 MG tablet  Commonly known as:  LIPITOR  Take 1 tablet (40 mg total) by mouth daily at 6 PM.     ergocalciferol 50000 UNITS capsule  Commonly known as:  VITAMIN D2  Take 1 capsule (50,000 Units total) by mouth once a week.     lacosamide 200 MG Tabs tablet  Commonly known as:  VIMPAT  Take 1 tablet (200 mg total) by mouth 2 (two) times daily.     levETIRAcetam 750 MG tablet  Commonly known as:  KEPPRA  Take 2 tablets (1,500 mg total) by mouth 2 (two) times daily.     Linaclotide 145 MCG Caps capsule  Commonly known as:  LINZESS  Take 1 capsule (145 mcg total) by mouth daily.     lisinopril-hydrochlorothiazide 10-12.5 MG tablet  Commonly known as:  ZESTORETIC  Take 1 tablet by mouth daily.     megestrol 400 MG/10ML suspension  Commonly known as:  MEGACE  Take 5 mLs (200 mg total) by mouth 3 (three) times daily with meals.     multivitamin with minerals Tabs tablet  Take 1 tablet by mouth daily.     omeprazole 20 MG capsule    Commonly known as:  PRILOSEC  Take 1 capsule (20 mg total) by mouth daily.     phenytoin 50 MG tablet  Commonly known as:  DILANTIN  Chew 2 tablets (100 mg total) by mouth 3 (three) times daily.     potassium chloride 10 MEQ tablet  Commonly known as:  K-DUR  Take 2 tablets (20 mEq total) by mouth daily.        Disposition and follow-up:   [name] was discharged from [facility] in Stable condition.  At the hospital follow up visit please address:  1.  Seizure medication regimen and ability to obtain medications, continued right facial twitching, aphasia  2.  Labs / imaging needed at time of follow-up: CMP, Phenytoin level  3.  Pending labs/ test needing follow-up: none  Follow-up Appointments: Follow-up Information    Follow up with [facility].   Why:  shower stool   Contact information:   [address] [place] [address] [phone]       Follow up with [facility].   Why:  home health physical and occupational therapy, speech therapy, nurse   Contact information:   [address] [place] [address] [phone]  Discharge Instructions: Discharge Instructions    Diet - low sodium heart healthy    Complete by:  As directed      Discharge instructions    Complete by:  As directed   Please be sure to take all of your medications.  We will have the internal medicine clinic call you to schedule a follow-up appointment.     Increase activity slowly    Complete by:  As directed            Consultations:    Procedures Performed:  Ct Head Wo Contrast  10/31/2014  CLINICAL DATA:  Aphasia, unresponsive, history of seizures stroke EXAM: CT HEAD WITHOUT CONTRAST TECHNIQUE: Contiguous axial images were obtained from the base of the skull through the vertex without intravenous contrast. COMPARISON:  10/28/2014 FINDINGS: Motion degraded images. No evidence of parenchymal hemorrhage  or extra-axial fluid collection. No mass lesion, mass effect, or midline shift. No CT evidence of acute infarction. Cephalexin changes related to old left MCA division infarct. Subcortical white matter and periventricular small vessel ischemic changes. Intracranial atherosclerosis. Global cortical atrophy.  No ventriculomegaly. The visualized paranasal sinuses are essentially clear. The mastoid air cells are unopacified. No evidence of calvarial fracture. IMPRESSION: No evidence of acute intracranial abnormality. Encephalomalacic changes related to old left MCA distribution infarct. Atrophy with small vessel ischemic changes. Electronically Signed   By: Julian Hy M.D.   On: 10/31/2014 13:49   Ct Head Wo Contrast  10/28/2014  CLINICAL DATA:  Aphasia. EXAM: CT HEAD WITHOUT CONTRAST TECHNIQUE: Contiguous axial images were obtained from the base of the skull through the vertex without intravenous contrast. COMPARISON:  10/26/2014 FINDINGS: Again noted is the old infarct on the left in the region of the insular cortex and deep white matter, stable. No acute infarction. Mild ex vacuo dilatation of the left lateral ventricle. No hydrocephalus. No hemorrhage or midline shift. Visualized paranasal sinuses and mastoids clear. Orbital soft tissues unremarkable. 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  No acute intracranial abnormality. Electronically Signed   By: Rolm Baptise M.D.   On: 10/28/2014 15:52   Ct Head Wo Contrast  10/26/2014  CLINICAL DATA:  Patient has seizure lasting for 10-15 minutes. Witnessed seizure. Patient had uncontrolled movements during scan which limits evaluation. Repeat scan was performed. EXAM: CT HEAD WITHOUT CONTRAST TECHNIQUE: Contiguous axial images were obtained from the base of the skull through the vertex without intravenous contrast. COMPARISON:  None. FINDINGS: Repeat imaging improved head motion. No acute intracranial hemorrhage. No focal mass lesion. No CT evidence of acute  infarction. No midline shift or mass effect. No hydrocephalus. Basilar cisterns are patent. Focal hypodensity in the in the LEFT external capsule, LEFT insular ribbon and centrum semiovale. There is mild ventricular dilatation on the LEFT associated with volume loss. Paranasal sinuses and  mastoid air cells are clear. IMPRESSION: 1. No acute intracranial findings. 2. Chronic infarction in the deep white matter and insular ribbon on the LEFT with mild ex vacuo dilatation of the lateral ventricle. Electronically Signed   By: Suzy Bouchard M.D.   On: 10/26/2014 14:34   Dg Chest Port 1 View  10/26/2014  CLINICAL DATA:  Seizure.  Rightward gaze.  Cough and congestion. EXAM: PORTABLE CHEST 1 VIEW COMPARISON:  None. FINDINGS: Right rotated chest radiograph. Top-normal heart size. Mildly tortuous thoracic aorta. Otherwise normal mediastinal contour. No pneumothorax. No pleural effusion. There is patchy opacity at the right greater than left lung bases. No pulmonary edema. IMPRESSION: Patchy opacity at  the right greater than left lung bases, suspicious for pneumonia and/or aspiration. Electronically Signed   By: Ilona Sorrel M.D.   On: 10/26/2014 13:58   EEG Description:In the wakeful state, the best background consisted of a medium amplitude, posterior dominant, well sustained, symmetric and reactive 8 Hz rhythm. There is notable background disorganization over the left hemisphere, with intermixed polymorphic sharply contoured theta and concurrent higher frequencies in the alpha range, as well as frequent sharp waves over the T3, T5 electrodes which not infrequently occur in a repetitive fashion. Additionally, several train of repetitive and worrisome spike and polyspike and wave discharges at higher frequencies over the left hemisphere were noted, at times preceding and accompanying patient right facial twitching.  EKG showed sinus rhythm.  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  A more concerning pattern of repetitive and worrisome trains of spike and polyspike and wave discharges at higher frequencies over the left hemisphere were noted, at times preceding and accompanying patient right facial twitching which in the appropriate clinical scenario could be indicative of electrographic seizures. Clinical correlation is advised.  2D Echo: none3  Cardiac Cath: none  Admission HPI: Ms. Hugh is a 59 yo female with HTN, GERD, h/o seizure d/o, and h/o CVA with residual right sided weakness, presenting with seizure. History is obtained from the patient's sister. She states that the patient was at her baseline this morning and bathing herself. The sister then fell asleep and was woken by a neighbor who witnessed the patient lying on the floor shaking. The sister reports shaking of upper and lower extremities. No tongue biting or loss of bowel/bladder control was noted. The sister witnessed the seizure for about 10-15 minutes before it was aborted by Versed provided by EMS. The sister does not know when the seizure began. She has continued to be confused since she arrived in the ED. The patient has a h/o seizure disorder, but the sister does not know what her typical seizures look like. She was managed on Keppra in the past, but has not been taking it for the last month, as the patient moved to [facility] but could not get her Medicare switched.   The sister states that the patient had a stroke 1-2 years ago, with residual right sided weakness that is steadily improving. She was amble to ambulate and walk up/down stairs without assistance. She also states the patient was able to talk fluently and  conversantly prior to the seizure today.  Per the sister, the patient has not been complaining of fever, chills, runny nose, congestion, N/V, constipation, diarrhea, or dysuria. She has had 1 week of light, nonproductive cough.  In the ED, the patient was tachycardic and hypoxic on arrival, desatting to upper 80%s on RA. CXR demonstrated a R>L lower lobe patchy opacity concerning for PNA/aspiration. The patient has no h/o dysphagia.  Hospital Course by problem list: Principal Problem:   Focal and partial seizures ([place]) Active Problems:   Generalized seizures ([place])   CVA (cerebral vascular accident) ([place])   Hepatitis C antibody test positive   Malnutrition of moderate degree   Aphasia   Sickle cell disease (HCC)   Generalized seizure with simple partial continuous seizure: Patient presented with generalized tonic-clonic seizure  in the setting of medication nonadherence (due to no medications after move to [place]).  Seizure was aborted by EMS with 2.5 mg Versed.  She was loaded with Keppra and continued on BID Keppra dosing without repeat of generalized seizure.   She was expressive aphasic on initial presentation, which was a new deficit per family.  It was unclear whether new aphasia was 2/2 new stroke or post-ictal.  Initial and repeat CT head scans did not reveal new stroke.  Patient was later noticed to be exhibiting a lower right facial twitch.  EEG corroborated focal seizure.  Titration of Keppra to max dosing (1500 mg BID) and addition of Phenytoin and Lacosamide were not able to control the partial seizures. Neurology consult noted that continuous partial seizures are difficult to control.  It is believed that her aphasia is due to ongoing focal seizure involving Broca's area.  She was discharged on max dosing of Keppra, Dilantin, and Vimpat with neurology follow up on Dec 8 at 8:30 am. She was given medication assistance to help afford her AEDs.  H/o CVA: Patient previously sustained left  MCA infarct with residual right sided weakness.  Initial and repeat CT scans did not reveal new ischemic event.  She was continued on her home ASA. Atorvastatin was increased to 40 mg daily.  She was confirmed to have sickle-cell disease, but there were no signs or symptoms of sickling crisis or new stroke.   Discharge Vitals:   BP 129/78 mmHg  Pulse 87  Temp(Src) 97.8 F (36.6 C) (Oral)  Resp 20  Ht 5\' 3"  (1.6 m)  Wt 105 lb 2.6 oz (47.7 kg)  BMI 18.63 kg/m2  SpO2 96%  Discharge Labs:  No results found for this or any previous visit (from the past 24 hour(s)).  Signed: Iline Oven, MD 11/02/2014, 9:33 AM    Services Ordered on Discharge: [facility] PT, OT, SLP, RN Equipment Ordered on Discharge: shower stool

## 2014-11-04 ENCOUNTER — Telehealth: Payer: Self-pay | Admitting: Internal Medicine

## 2014-11-04 NOTE — Telephone Encounter (Signed)
Talked with Dr Juleen China -  VO given to [facility] - agrees.

## 2014-11-04 NOTE — Telephone Encounter (Signed)
Tina Sanders from [facility] called requesting verbal order physical therapy for once a week for one week, twice a week for four weeks and once a week for one week.

## 2014-11-05 ENCOUNTER — Ambulatory Visit: Payer: Medicare Other | Admitting: Rehabilitation

## 2014-11-05 ENCOUNTER — Encounter: Payer: Medicare Other | Admitting: Occupational Therapy

## 2014-11-06 ENCOUNTER — Ambulatory Visit: Payer: Medicare Other | Admitting: Rehabilitation

## 2014-11-06 ENCOUNTER — Encounter: Payer: Medicare Other | Admitting: Occupational Therapy

## 2014-11-09 ENCOUNTER — Emergency Department (HOSPITAL_COMMUNITY)
Admission: EM | Admit: 2014-11-09 | Discharge: 2014-11-09 | Disposition: A | Discharge status: Home / Self Care | Payer: Medicare Other | Attending: Emergency Medicine | Admitting: Emergency Medicine

## 2014-11-09 ENCOUNTER — Emergency Department (HOSPITAL_COMMUNITY): Payer: Medicare Other

## 2014-11-09 ENCOUNTER — Encounter (HOSPITAL_COMMUNITY): Payer: Self-pay | Admitting: Emergency Medicine

## 2014-11-09 DIAGNOSIS — R0781 Pleurodynia: Secondary | ICD-10-CM | POA: Diagnosis not present

## 2014-11-09 DIAGNOSIS — Z7982 Long term (current) use of aspirin: Secondary | ICD-10-CM | POA: Diagnosis not present

## 2014-11-09 DIAGNOSIS — Z8673 Personal history of transient ischemic attack (TIA), and cerebral infarction without residual deficits: Secondary | ICD-10-CM | POA: Diagnosis not present

## 2014-11-09 DIAGNOSIS — I10 Essential (primary) hypertension: Secondary | ICD-10-CM | POA: Diagnosis not present

## 2014-11-09 DIAGNOSIS — Z79899 Other long term (current) drug therapy: Secondary | ICD-10-CM | POA: Insufficient documentation

## 2014-11-09 DIAGNOSIS — R109 Unspecified abdominal pain: Secondary | ICD-10-CM | POA: Diagnosis not present

## 2014-11-09 DIAGNOSIS — Z880 Allergy status to penicillin: Secondary | ICD-10-CM | POA: Insufficient documentation

## 2014-11-09 DIAGNOSIS — Z8781 Personal history of (healed) traumatic fracture: Secondary | ICD-10-CM | POA: Insufficient documentation

## 2014-11-09 DIAGNOSIS — G40909 Epilepsy, unspecified, not intractable, without status epilepticus: Secondary | ICD-10-CM | POA: Diagnosis not present

## 2014-11-09 DIAGNOSIS — E559 Vitamin D deficiency, unspecified: Secondary | ICD-10-CM | POA: Diagnosis not present

## 2014-11-09 DIAGNOSIS — Z720 Tobacco use: Secondary | ICD-10-CM | POA: Insufficient documentation

## 2014-11-09 DIAGNOSIS — F0390 Unspecified dementia without behavioral disturbance: Secondary | ICD-10-CM | POA: Insufficient documentation

## 2014-11-09 DIAGNOSIS — K219 Gastro-esophageal reflux disease without esophagitis: Secondary | ICD-10-CM | POA: Insufficient documentation

## 2014-11-09 DIAGNOSIS — M25559 Pain in unspecified hip: Secondary | ICD-10-CM | POA: Diagnosis present

## 2014-11-09 LAB — URINALYSIS, ROUTINE W REFLEX MICROSCOPIC
BILIRUBIN URINE: NEGATIVE
Glucose, UA: NEGATIVE mg/dL
Hgb urine dipstick: NEGATIVE
KETONES UR: NEGATIVE mg/dL
Leukocytes, UA: NEGATIVE
Nitrite: NEGATIVE
PH: 5 (ref 5.0–8.0)
Protein, ur: NEGATIVE mg/dL
Specific Gravity, Urine: 1.011 (ref 1.005–1.030)
Urobilinogen, UA: 0.2 mg/dL (ref 0.0–1.0)

## 2014-11-09 NOTE — Discharge Instructions (Signed)
Flank Pain °Flank pain refers to pain that is located on the side of the body between the upper abdomen and the back. The pain may occur over a short period of time (acute) or may be long-term or reoccurring (chronic). It may be mild or severe. Flank pain can be caused by many things. °CAUSES  °Some of the more common causes of flank pain include: °· Muscle strains.   °· Muscle spasms.   °· A disease of your spine (vertebral disk disease).   °· A lung infection (pneumonia).   °· Fluid around your lungs (pulmonary edema).   °· A kidney infection.   °· Kidney stones.   °· A very painful skin rash caused by the chickenpox virus (shingles).   °· Gallbladder disease.   °HOME CARE INSTRUCTIONS  °Home care will depend on the cause of your pain. In general, °· Rest as directed by your caregiver. °· Drink enough fluids to keep your urine clear or pale yellow. °· Only take over-the-counter or prescription medicines as directed by your caregiver. Some medicines may help relieve the pain. °· Tell your caregiver about any changes in your pain. °· Follow up with your caregiver as directed. °SEEK IMMEDIATE MEDICAL CARE IF:  °· Your pain is not controlled with medicine.   °· You have new or worsening symptoms. °· Your pain increases.   °· You have abdominal pain.   °· You have shortness of breath.   °· You have persistent nausea or vomiting.   °· You have swelling in your abdomen.   °· You feel faint or pass out.   °· You have blood in your urine. °· You have a fever or persistent symptoms for more than 2-3 days. °· You have a fever and your symptoms suddenly get worse. °MAKE SURE YOU:  °· Understand these instructions. °· Will watch your condition. °· Will get help right away if you are not doing well or get worse. °  °This information is not intended to replace advice given to you by your health care provider. Make sure you discuss any questions you have with your health care provider. °  °Document Released: 02/16/2005 Document  Revised: 09/20/2011 Document Reviewed: 08/10/2011 °Elsevier Interactive Patient Education ©2016 [employer] Inc. ° °

## 2014-11-09 NOTE — ED Provider Notes (Signed)
CSN: [id number]     Arrival date & time 11/09/14  1014 History   First MD Initiated Contact with Patient 11/09/14 1016     Chief Complaint  Patient presents with  . Hip Pain    Pain     (Consider location/radiation/quality/duration/timing/severity/associated sxs/prior Treatment) Patient is a 59 y.o. female presenting with hip pain. The history is provided by the patient and a relative. No language interpreter was used.  Hip Pain This is a recurrent problem. The current episode started in the past 7 days. The problem occurs constantly. The problem has been gradually worsening. Pertinent negatives include no abdominal pain, anorexia, change in bowel habit, fever or weakness. Nothing aggravates the symptoms. She has tried nothing for the symptoms. The treatment provided moderate relief.   Daughter reports pt has been complaining more of pain in her side today than she has in the past few days.   She is concerned taht pt may have fell last night. She does not see any injuries  Pt has had a stroke and has difficulty communicationg Past Medical History  Diagnosis Date  . GERD (gastroesophageal reflux disease)   . Seizures ([place])   . Hypertension   . B12 deficiency   . Vitamin D deficiency disease   . Stroke [facility])     residual right sided deficits, expressive dysphasia   History reviewed. No pertinent past surgical history. Family History  Problem Relation Age of Onset  . Hypertension Mother   . Hypertension Father   . Kidney disease Father   . Hyperlipidemia Father   . Hypertension Sister   . Diabetes Sister    Social History  Substance Use Topics  . Smoking status: Current Every Day Smoker -- 0.25 packs/day for 40 years    Types: Cigarettes  . Smokeless tobacco: None  . Alcohol Use: 8.4 oz/week    0 Standard drinks or equivalent, 14 Cans of beer per week   OB History    No data available     Review of Systems  Unable to perform ROS: Dementia  Constitutional: Negative for  fever.  Gastrointestinal: Negative for abdominal pain, anorexia and change in bowel habit.  Neurological: Negative for weakness.  All other systems reviewed and are negative.     Allergies  Penicillins  Home Medications   Prior to Admission medications   Medication Sig Start Date End Date Taking? Authorizing Provider  aspirin 81 MG chewable tablet Chew 1 tablet (81 mg total) by mouth daily. 10/31/14  Yes Iline Oven, MD  atorvastatin (LIPITOR) 40 MG tablet Take 1 tablet (40 mg total) by mouth daily at 6 PM. 10/31/14  Yes Iline Oven, MD  lacosamide (VIMPAT) 200 MG TABS tablet Take 1 tablet (200 mg total) by mouth 2 (two) times daily. 10/31/14  Yes Iline Oven, MD  levETIRAcetam (KEPPRA) 750 MG tablet Take 2 tablets (1,500 mg total) by mouth 2 (two) times daily. 10/31/14  Yes Iline Oven, MD  Linaclotide [facility]) 145 MCG CAPS capsule Take 1 capsule (145 mcg total) by mouth daily. 10/14/14  Yes Jule Ser, DO  lisinopril-hydrochlorothiazide (ZESTORETIC) 10-12.5 MG tablet Take 1 tablet by mouth daily. 10/31/14 10/31/15 Yes Iline Oven, MD  megestrol (MEGACE) 400 MG/10ML suspension Take 5 mLs (200 mg total) by mouth 3 (three) times daily with meals. 10/14/14  Yes Jule Ser, DO  Multiple Vitamin (MULTIVITAMIN WITH MINERALS) TABS tablet Take 1 tablet by mouth daily. 10/31/14  Yes Iline Oven, MD  omeprazole (  PRILOSEC) 20 MG capsule Take 1 capsule (20 mg total) by mouth daily. 10/14/14 10/14/15 Yes Jule Ser, DO  phenytoin (DILANTIN) 50 MG tablet Chew 2 tablets (100 mg total) by mouth 3 (three) times daily. 10/31/14  Yes Iline Oven, MD  potassium chloride (K-DUR) 10 MEQ tablet Take 2 tablets (20 mEq total) by mouth daily. 10/14/14  Yes Jule Ser, DO  albuterol (PROVENTIL) (5 MG/ML) 0.5% nebulizer solution Take 0.5 mLs (2.5 mg total) by nebulization every 6 (six) hours as needed for wheezing or shortness of breath. Patient not taking:  Reported on 11/09/2014 10/14/14   Jule Ser, DO  ergocalciferol (VITAMIN D2) 50000 UNITS capsule Take 1 capsule (50,000 Units total) by mouth once a week. 10/14/14   Jule Ser, DO   BP 108/63 mmHg  Pulse 90  Temp(Src) 98.7 F (37.1 C) (Oral)  Resp 18  SpO2 92% Physical Exam  Constitutional: She appears well-developed and well-nourished.  HENT:  Head: Normocephalic and atraumatic.  Right Ear: External ear normal.  No swelling or head trauma  Eyes: Conjunctivae and EOM are normal. Pupils are equal, round, and reactive to light.  Neck: Normal range of motion. Neck supple.  Cardiovascular: Normal rate.   Pulmonary/Chest: Effort normal.  Abdominal: Soft. She exhibits no distension.  Musculoskeletal: Normal range of motion.  Hips nontender,  Some pain ribs to palpation,  No bruising.   From arms and legs  Neurological: She is alert.  Skin: Skin is warm.  Psychiatric: She has a normal mood and affect.  Nursing note and vitals reviewed.   ED Course  Procedures (including critical care time) Labs Review Labs Reviewed  URINE CULTURE  URINALYSIS, ROUTINE W REFLEX MICROSCOPIC (NOT AT [facility])    Imaging Review Dg Chest 2 View  11/09/2014  CLINICAL DATA:  Pain EXAM: CHEST  2 VIEW COMPARISON:  10/26/2014 FINDINGS: Cardiomediastinal silhouette is stable. There is linear atelectasis or scarring left base. Thoracic spine osteopenia. No segmental infiltrate or pulmonary edema. IMPRESSION: No infiltrate or pulmonary edema. Left base linear atelectasis or scarring. Electronically Signed   By: Lahoma Crocker M.D.   On: 11/09/2014 11:31   Dg Lumbar Spine Complete  11/09/2014  CLINICAL DATA:  Fall.  Low back pain. EXAM: LUMBAR SPINE - COMPLETE 4+ VIEW COMPARISON:  None. FINDINGS: This report assumes 5 non rib-bearing lumbar vertebrae. Lumbar vertebral body heights are preserved, with no fracture or suspicious focal osseous lesion. Mild spondylosis deformans throughout the lumbar spine, most  prominent at L2-3 and L3-4. No spondylolisthesis. Minimal facet arthropathy bilaterally in the lower lumbar spine. No appreciable foraminal stenosis. Atherosclerotic calcifications in the abdominal aorta. IMPRESSION: 1. No fracture or malalignment in the lumbar spine. 2. Mild spondylosis deformans and facet arthropathy in the lumbar spine. Electronically Signed   By: Ilona Sorrel M.D.   On: 11/09/2014 11:35   Dg Pelvis 1-2 Views  11/09/2014  CLINICAL DATA:  Pain EXAM: PELVIS - 1-2 VIEW COMPARISON:  None. FINDINGS: Single frontal view of the pelvis submitted. No acute fracture or subluxation. Degenerative changes pubic symphysis. IMPRESSION: Negative. Electronically Signed   By: Lahoma Crocker M.D.   On: 11/09/2014 11:31   I have personally reviewed and evaluated these images and lab results as part of my medical decision-making.   EKG Interpretation None      MDM   Final diagnoses:  Left flank pain, recent rib fractures     No new injuries.   Family advised to return if any concerns,   Magda Paganini  Brandon Melnick, PA-C 11/09/14 [place], PA-C 11/09/14 1308  Gareth Morgan, MD 11/09/14 [id number]

## 2014-11-09 NOTE — ED Notes (Signed)
Pt to ED with sister - sister reports pt may have fell last night because pt is complaining of left side of body being in pain. Pt has aphasia from previous stroke. Pt has difficulty expressing thoughts to this RN. Difficult to understand. PA at bedside. VSS. No blood thinners. No obvious injuries.

## 2014-11-10 ENCOUNTER — Ambulatory Visit: Payer: Medicare Other | Admitting: Physical Therapy

## 2014-11-10 ENCOUNTER — Encounter: Payer: Medicare Other | Admitting: Occupational Therapy

## 2014-11-10 LAB — URINE CULTURE

## 2014-11-12 ENCOUNTER — Telehealth: Payer: Self-pay | Admitting: Licensed Clinical Social Worker

## 2014-11-12 ENCOUNTER — Ambulatory Visit: Payer: Medicare Other | Admitting: Physical Therapy

## 2014-11-12 ENCOUNTER — Encounter: Payer: Medicare Other | Admitting: Occupational Therapy

## 2014-11-12 NOTE — Telephone Encounter (Signed)
CSW received call from [facility] SW that has been working with Tina Sanders in the home.  Pt is in need of PCS, pt's sister will be returning to work soon.  Tina Sanders was seen in [facility] on 10/14/14 and was assess by outpatient PT on 10/19/14 which indicated pt needed assistance with ADL's.  CSW will initiate PCS request and forward to PCP.

## 2014-11-13 ENCOUNTER — Ambulatory Visit (INDEPENDENT_AMBULATORY_CARE_PROVIDER_SITE_OTHER): Payer: Medicare Other | Admitting: Internal Medicine

## 2014-11-13 ENCOUNTER — Encounter: Payer: Self-pay | Admitting: Internal Medicine

## 2014-11-13 VITALS — BP 122/78 | HR 104 | Temp 97.8°F | Ht 63.0 in | Wt 110.6 lb

## 2014-11-13 DIAGNOSIS — F17210 Nicotine dependence, cigarettes, uncomplicated: Secondary | ICD-10-CM | POA: Diagnosis not present

## 2014-11-13 DIAGNOSIS — E785 Hyperlipidemia, unspecified: Secondary | ICD-10-CM | POA: Diagnosis not present

## 2014-11-13 DIAGNOSIS — G40109 Localization-related (focal) (partial) symptomatic epilepsy and epileptic syndromes with simple partial seizures, not intractable, without status epilepticus: Secondary | ICD-10-CM

## 2014-11-13 DIAGNOSIS — I639 Cerebral infarction, unspecified: Secondary | ICD-10-CM

## 2014-11-13 DIAGNOSIS — I10 Essential (primary) hypertension: Secondary | ICD-10-CM

## 2014-11-13 NOTE — Assessment & Plan Note (Signed)
Compliant with atorvastatin 40 mg daily.

## 2014-11-13 NOTE — Progress Notes (Signed)
Subjective:    Patient ID: Tina Sanders, female    DOB: 23-Jun-1955, 59 y.o.   MRN: [id number]  HPI Tina Sanders is a 59 y.o. female with PMHx of GERD, Seizure, h/o CVA, HTN who presents to the clinic for hospital follow up for seizures. Please see A&P for the status of the patient's chronic medical problems.   Patient presents for hospital follow up after being hospitalized from focal and partial seizures secondary to medication non-compliance. Since discharge, patient has been doing very well per her sister and caretaker. Patient has been compliant with all of her medications: Vimpat 200 mg BID, Keppra 1500 mg BID, Dilantin 100 mg TID. Her speech, strength and general well being has improved. She is not quite back to her baseline yet, but close per the sister. She feeds herself, walks on her own, bathes and cleans herself and dresses herself. Her facial twitch is resolved. PT has been coming to the house and working with her. Patient has follow up with Neurology on 12/17/14. On exam, patient talks to me, but does seem confused about some of my questions. She is oriented to herself, but seems frustrated not wanting to answer my other questions. She has baseline weakness in the right upper and lower extremities 3-4/5. 5/5 on the left. No facial twitch is present. Some of the exam is limited by confusion of my directions as she does not raise her eyebrows, but can close her eyes, puff her cheeks (right > left), stick out her tongue. She does not raise her shoulders. Sensation intact bilaterally.   Past Medical History  Diagnosis Date  . GERD (gastroesophageal reflux disease)   . Seizures ([place])   . Hypertension   . B12 deficiency   . Vitamin D deficiency disease   . Stroke [facility])     residual right sided deficits, expressive dysphasia    Outpatient Encounter Prescriptions as of 11/13/2014  Medication Sig Note  . albuterol (PROVENTIL) (5 MG/ML) 0.5% nebulizer solution Take 0.5 mLs (2.5 mg  total) by nebulization every 6 (six) hours as needed for wheezing or shortness of breath. (Patient not taking: Reported on 11/09/2014)   . aspirin 81 MG chewable tablet Chew 1 tablet (81 mg total) by mouth daily.   Marland Kitchen atorvastatin (LIPITOR) 40 MG tablet Take 1 tablet (40 mg total) by mouth daily at 6 PM.   . ergocalciferol (VITAMIN D2) 50000 UNITS capsule Take 1 capsule (50,000 Units total) by mouth once a week. 11/09/2014: Has not started taking this medication   . lacosamide (VIMPAT) 200 MG TABS tablet Take 1 tablet (200 mg total) by mouth 2 (two) times daily.   Marland Kitchen levETIRAcetam (KEPPRA) 750 MG tablet Take 2 tablets (1,500 mg total) by mouth 2 (two) times daily.   . Linaclotide (LINZESS) 145 MCG CAPS capsule Take 1 capsule (145 mcg total) by mouth daily.   Marland Kitchen lisinopril-hydrochlorothiazide (ZESTORETIC) 10-12.5 MG tablet Take 1 tablet by mouth daily.   . megestrol (MEGACE) 400 MG/10ML suspension Take 5 mLs (200 mg total) by mouth 3 (three) times daily with meals.   . Multiple Vitamin (MULTIVITAMIN WITH MINERALS) TABS tablet Take 1 tablet by mouth daily.   Marland Kitchen omeprazole (PRILOSEC) 20 MG capsule Take 1 capsule (20 mg total) by mouth daily.   . phenytoin (DILANTIN) 50 MG tablet Chew 2 tablets (100 mg total) by mouth 3 (three) times daily.   . potassium chloride (K-DUR) 10 MEQ tablet Take 2 tablets (20 mEq total) by mouth daily.  No facility-administered encounter medications on file as of 11/13/2014.    Family History  Problem Relation Age of Onset  . Hypertension Mother   . Hypertension Father   . Kidney disease Father   . Hyperlipidemia Father   . Hypertension Sister   . Diabetes Sister     Social History   Social History  . Marital Status: Single    Spouse Name: N/A  . Number of Children: N/A  . Years of Education: N/A   Occupational History  . Not on file.   Social History Main Topics  . Smoking status: Current Every Day Smoker -- 0.25 packs/day for 40 years    Types:  Cigarettes  . Smokeless tobacco: Not on file  . Alcohol Use: 8.4 oz/week    0 Standard drinks or equivalent, 14 Cans of beer per week  . Drug Use: Not on file  . Sexual Activity: Not on file   Other Topics Concern  . Not on file   Social History Narrative   Review of Systems Some obtained from sister General: Admits to decreased appetite. Denies fever, chills, fatigue Respiratory: Denies SOB, cough   Cardiovascular: Denies chest pain and palpitations.  Gastrointestinal: Denies nausea, vomiting, abdominal pain Musculoskeletal: Admits to rib pain (chronic, improving).  Neurological: Admits to weakness (right side, chronic, unchanged). Denies dizziness, headaches, lightheadedness, numbness, seizures     Objective:   Physical Exam Filed Vitals:   11/13/14 1022  BP: 122/78  Pulse: 104  Temp: 97.8 F (36.6 C)  TempSrc: Oral  Height: 5\' 3"  (1.6 m)  Weight: 110 lb 9.6 oz (50.168 kg)  SpO2: 99%   General: Vital signs reviewed.  Patient is chronically ill appearing, in no acute distress and cooperative with exam.  Cardiovascular: RRR Pulmonary/Chest: Clear to auscultation bilaterally, no wheezes, rales, or rhonchi. Abdominal: Soft, non-tender, non-distended, BS +  Extremities: No lower extremity edema bilaterally.  Neurological: A&O x1, 3-4/5 strength in RUE and RLE (chronic), 5/5 strength in LUE and LLE, some of the exam is limited by confusion of my directions as she does not raise her eyebrows, but can close her eyes, puff her cheeks (right > left), stick out her tongue. She does not raise her shoulders. Sensory intact to light touch bilaterally, no evidence of facial twitch or seizure.   Skin: Warm, dry and intact. No rashes or erythema.     Assessment & Plan:   Please see problem based assessment and plan.

## 2014-11-13 NOTE — Progress Notes (Signed)
Internal Medicine Clinic Attending  I saw and evaluated the patient.  I personally confirmed the key portions of the history and exam documented by Dr. Marvel Plan and I reviewed pertinent patient test results.  The assessment, diagnosis, and plan were formulated together and I agree with the documentation in the resident's note.  Tina Sanders is well known to me from her recent hospitalization. She likely had a small CVA that led to a persistent partial seizure causing her to have facial twitching and aphasia. She was discharged on three drug AED. Today her facial twitch is resolved, her speech is improved. She can say simple sentences but still has some expressive aphasia, which I think is a residual from a small CVA. Doing well on her medications with the help of her sister, including aspirin and atorvastatin for secondary prevention. Neuro follow up is arranged. Doing well with home PT which we will continue.

## 2014-11-13 NOTE — Assessment & Plan Note (Addendum)
Patient presents for hospital follow up after being hospitalized from focal and partial seizures secondary to medication non-compliance. Since discharge, patient has been doing very well per her sister and caretaker. Patient has been compliant with all of her medications: Vimpat 200 mg BID, Keppra 1500 mg BID, Dilantin 100 mg TID. Her speech, strength and general well being has improved. She is not quite back to her baseline yet, but close per the sister. She feeds herself, walks on her own, bathes and cleans herself and dresses herself. Her facial twitch is resolved. PT has been coming to the house and working with her. Patient has follow up with Neurology on 12/17/14. On exam, patient talks to me, but does seem confused about some of my questions. She is oriented to herself, but seems frustrated not wanting to answer my other questions. She has baseline weakness in the right upper and lower extremities 3-4/5. 5/5 on the left. No facial twitch is present. Some of the exam is limited by confusion of my directions as she does not raise her eyebrows, but can close her eyes, puff her cheeks (right > left), stick out her tongue. She does not raise her shoulders. Sensation intact bilaterally.  Plan: -Continue Vimpat 200 mg BID -Continue Keppra 1500 mg BID -Continue Dilantin 100 mg TID -Follow up with Neurology on 12/17/14 -Check CMET and Dilantin level today

## 2014-11-13 NOTE — Addendum Note (Signed)
Addended by: Lalla Brothers T on: 11/13/2014 12:01 PM   Modules accepted: Level of Service

## 2014-11-13 NOTE — Patient Instructions (Signed)
CONTINUE ALL MEDICATIONS THE SAME  FOLLOW UP WITH NEUROLOGY ON 12/17/14.  RETURN IN 3 MONTHS

## 2014-11-13 NOTE — Assessment & Plan Note (Signed)
BP Readings from Last 3 Encounters:  11/13/14 122/78  11/09/14 108/63  11/01/14 129/78    Lab Results  Component Value Date   NA 143 11/01/2014   K 4.3 11/01/2014   CREATININE 0.99 11/01/2014    Assessment: Blood pressure control:  Controlled Progress toward BP goal:   At goal Comments: Compliant with lisinopril-hctz 10-12.5 mg daily and kdur.   Plan: Medications:  continue current medications Educational resources provided: brochure, handout, video Self management tools provided:   Other plans: Check CMET

## 2014-11-14 LAB — CMP14 + ANION GAP
ALK PHOS: 76 IU/L (ref 39–117)
ALT: 11 IU/L (ref 0–32)
ANION GAP: 16 mmol/L (ref 10.0–18.0)
AST: 22 IU/L (ref 0–40)
Albumin/Globulin Ratio: 1.2 (ref 1.1–2.5)
Albumin: 4 g/dL (ref 3.6–4.8)
BUN/Creatinine Ratio: 12 (ref 11–26)
BUN: 10 mg/dL (ref 8–27)
Bilirubin Total: 0.3 mg/dL (ref 0.0–1.2)
CALCIUM: 9.5 mg/dL (ref 8.7–10.3)
CO2: 20 mmol/L (ref 18–29)
CREATININE: 0.83 mg/dL (ref 0.57–1.00)
Chloride: 100 mmol/L (ref 97–106)
GFR calc Af Amer: 88 mL/min/{1.73_m2} (ref >59)
GFR, EST NON AFRICAN AMERICAN: 76 mL/min/{1.73_m2} (ref >59)
GLOBULIN, TOTAL: 3.3 g/dL (ref 1.5–4.5)
GLUCOSE: 153 mg/dL — AB (ref 65–99)
Potassium: 4.4 mmol/L (ref 3.5–5.2)
SODIUM: 136 mmol/L (ref 136–144)
Total Protein: 7.3 g/dL (ref 6.0–8.5)

## 2014-11-14 LAB — PHENYTOIN LEVEL, TOTAL: PHENYTOIN (DILANTIN), SERUM: 19.9 ug/mL (ref 10.0–20.0)

## 2014-11-17 ENCOUNTER — Ambulatory Visit: Payer: Medicare Other | Admitting: Rehabilitation

## 2014-11-17 ENCOUNTER — Telehealth: Payer: Self-pay | Admitting: Internal Medicine

## 2014-11-17 ENCOUNTER — Encounter: Payer: Medicare Other | Admitting: Occupational Therapy

## 2014-11-17 NOTE — Telephone Encounter (Signed)
Tina Sanders from [facility] called requesting the nurse to call back for verbal order.

## 2014-11-19 NOTE — Telephone Encounter (Signed)
Left voicemail for [facility] pathologist with [facility]

## 2014-11-19 NOTE — Telephone Encounter (Signed)
Yes, thanks

## 2014-11-19 NOTE — Telephone Encounter (Signed)
Tina Sanders requesting VO for 1 additional speech visit for next week to follow up on swallow function  Is VO okay?

## 2014-11-20 ENCOUNTER — Ambulatory Visit: Payer: Medicare Other | Admitting: Rehabilitation

## 2014-11-20 ENCOUNTER — Encounter: Payer: Medicare Other | Admitting: Occupational Therapy

## 2014-11-24 ENCOUNTER — Telehealth: Payer: Self-pay | Admitting: Internal Medicine

## 2014-11-24 ENCOUNTER — Ambulatory Visit: Payer: Medicare Other | Admitting: Physical Therapy

## 2014-11-24 NOTE — Telephone Encounter (Signed)
Tina Sanders from [facility] requesting verbal order for PT, Occupational therapy and speech therapy to be fax to  [facility].

## 2014-11-24 NOTE — Telephone Encounter (Signed)
Left message for Rod Holler with [facility] about OT, PT and speech therapy. Family had cancel these services. Left message on pt home recording about these three therapy. Hilda Blades Ezmeralda Stefanick RN 11/24/14 2:10PM

## 2014-11-26 ENCOUNTER — Other Ambulatory Visit: Payer: Self-pay | Admitting: Internal Medicine

## 2014-11-26 ENCOUNTER — Ambulatory Visit: Payer: Medicare Other | Admitting: Physical Therapy

## 2014-11-26 DIAGNOSIS — I639 Cerebral infarction, unspecified: Secondary | ICD-10-CM

## 2014-11-26 NOTE — Telephone Encounter (Signed)
Left message on [place] ID phone line - Dr Juleen China will place orders for OT, PT ans speech therapy at neuro rehab. Family has not return call to clinic.

## 2014-11-30 ENCOUNTER — Ambulatory Visit: Payer: Medicare Other | Admitting: Rehabilitation

## 2014-12-01 ENCOUNTER — Encounter: Payer: Self-pay | Admitting: Student

## 2014-12-02 ENCOUNTER — Ambulatory Visit: Payer: Medicare Other | Admitting: Physical Therapy

## 2014-12-05 ENCOUNTER — Other Ambulatory Visit: Payer: Self-pay | Admitting: Internal Medicine

## 2014-12-07 ENCOUNTER — Ambulatory Visit: Payer: Medicare Other | Admitting: Rehabilitation

## 2014-12-08 ENCOUNTER — Other Ambulatory Visit: Payer: Self-pay | Admitting: Internal Medicine

## 2014-12-08 NOTE — Telephone Encounter (Signed)
Patient requesting a refill on Phenytoin,Keppra,Prilosec,Linzess,Lipitor,Vimpat and Zestoretic

## 2014-12-08 NOTE — Telephone Encounter (Signed)
Called to pharm 

## 2014-12-09 ENCOUNTER — Ambulatory Visit: Payer: Medicare Other | Admitting: Rehabilitation

## 2014-12-09 NOTE — Telephone Encounter (Signed)
Pt has refills on these meds, some were sent electronically, some were called in 11/29. Also some are at [facility], called pt, her [facility], [facility] [address] and [facility] [place], pt's sister informed

## 2014-12-10 ENCOUNTER — Encounter: Payer: Self-pay | Admitting: *Deleted

## 2014-12-14 ENCOUNTER — Telehealth: Payer: Self-pay | Admitting: Licensed Clinical Social Worker

## 2014-12-14 NOTE — Telephone Encounter (Signed)
CSW returned call to [name].  Tina Sanders is currently the primary caregiver for her sister, Tina Sanders.  Tina Sanders was able to provide assistance to Tina Sanders as sister states patient was told she could not live alone.  Sister is now looking for Assisted Living placement for Tina Sanders.  CSW encourage sister to contact pt's Medicaid Caseworker and start the process for the Special Assistance Program to help assist cost for adult care home placement; phone number provided.  Pt was seen in [facility] last month, will inquire with PCP if pt needs to schedule appointment for completion of FL-2.  CSW mailed letter to sister with information on the Special Assistance program and listing of [place] within [facility].

## 2014-12-17 ENCOUNTER — Ambulatory Visit: Payer: Medicare Other | Admitting: Neurology

## 2014-12-17 ENCOUNTER — Encounter: Payer: Self-pay | Admitting: Licensed Clinical Social Worker

## 2014-12-18 NOTE — Progress Notes (Signed)
CSW met with pt's sister Ilee Randleman on 12/17/14 to discuss questions regarding [facility] placement and transportation.  CSW has previously talked with Ms. BUNNY KLEIST briefly during a phone conversation.  Sister did follow up with DSS and has looked into [facility] online.  Ms. Cartha Rotert informed about what an [facility] is and discussed the independence patient would still have while living in an [facility].  Sister informed Dr. Juleen China will complete FL-2.  Sister aware FL-2 will be needed to explore placement at facilities.  CSW informed Ms. Lounsbury closer to patient's admission to [facility] [place] will complete a new FL-2 and PASRR.  Generally, pt will need a TB test within 30 days of admission.  Sister aware this worker will fax information to DSS and/or [facility] as needed.   Ms. CLARK CLOWDUS discussed transportation barriers regarding medical appointments and visiting [facility] for placement.  CSW discuss patient should be using DSS/TAMS transportation for medical appointments; phone number provided [phone].  Sister provided with the number sister should be using for transportation.  CSW suggested [employer] transportation for touring [facility].  SCAT application provided to sister for patient.  Sister encouraged to contact facilities to inquire about scheduled tours.  Sister aware this process is not an immediate process and will take some time.  No other social work needs noted at this time.  CSW will await for completion of FL-2.

## 2014-12-21 ENCOUNTER — Other Ambulatory Visit: Payer: Self-pay | Admitting: Internal Medicine

## 2014-12-21 DIAGNOSIS — K219 Gastro-esophageal reflux disease without esophagitis: Secondary | ICD-10-CM

## 2014-12-21 DIAGNOSIS — E559 Vitamin D deficiency, unspecified: Secondary | ICD-10-CM

## 2014-12-21 DIAGNOSIS — I639 Cerebral infarction, unspecified: Secondary | ICD-10-CM

## 2014-12-21 DIAGNOSIS — I10 Essential (primary) hypertension: Secondary | ICD-10-CM

## 2014-12-21 NOTE — Telephone Encounter (Signed)
Pt [facility] [address]

## 2014-12-22 MED ORDER — POTASSIUM CHLORIDE ER 10 MEQ PO TBCR: 20.0000 meq | EXTENDED_RELEASE_TABLET | Freq: Every day | ORAL | Status: DC

## 2014-12-22 MED ORDER — LEVETIRACETAM 750 MG PO TABS: 1500.0000 mg | ORAL_TABLET | Freq: Two times a day (BID) | ORAL | Status: DC

## 2014-12-22 MED ORDER — OMEPRAZOLE 20 MG PO CPDR: 20.0000 mg | DELAYED_RELEASE_CAPSULE | Freq: Every day | ORAL | Status: DC

## 2014-12-22 MED ORDER — PHENYTOIN 50 MG PO CHEW: 100.0000 mg | CHEWABLE_TABLET | Freq: Three times a day (TID) | ORAL | Status: DC

## 2014-12-22 MED ORDER — ERGOCALCIFEROL 1.25 MG (50000 UT) PO CAPS: 50000.0000 [IU] | ORAL_CAPSULE | ORAL | Status: DC

## 2014-12-22 MED ORDER — LISINOPRIL-HYDROCHLOROTHIAZIDE 10-12.5 MG PO TABS: 1.0000 | ORAL_TABLET | Freq: Every day | ORAL | Status: DC

## 2014-12-22 MED ORDER — ASPIRIN 81 MG PO CHEW: 81.0000 mg | CHEWABLE_TABLET | Freq: Every day | ORAL | Status: DC

## 2014-12-22 MED ORDER — MEGESTROL ACETATE 400 MG/10ML PO SUSP: 200.0000 mg | Freq: Three times a day (TID) | ORAL | Status: DC

## 2014-12-22 MED ORDER — ATORVASTATIN CALCIUM 40 MG PO TABS: 40.0000 mg | ORAL_TABLET | Freq: Every day | ORAL | Status: DC

## 2014-12-22 MED ORDER — LINACLOTIDE 145 MCG PO CAPS: 145.0000 ug | ORAL_CAPSULE | Freq: Every day | ORAL | Status: DC

## 2014-12-22 MED ORDER — LACOSAMIDE 200 MG PO TABS: 200.0000 mg | ORAL_TABLET | Freq: Two times a day (BID) | ORAL | Status: DC

## 2014-12-23 ENCOUNTER — Telehealth: Payer: Self-pay | Admitting: Licensed Clinical Social Worker

## 2014-12-23 NOTE — Telephone Encounter (Signed)
Two  Rx were called in to pharmacy that were not sent electronic. Hilda Blades Shineka Auble RN 12/23/14 11:20AM

## 2014-12-23 NOTE — Telephone Encounter (Signed)
CSW returned call to [name].  Pt's sister states she is having a difficult time communicating with facilities and is unsure what to say.  Sister provided the names of the facilities she called, unfortunately, CSW informed sister those agencies were nursing facilities/nursing homes and pt will need an [place].  CSW encouraged sister to refer to the information this worker mailed out as those agencies would accept Special Assistance.  CSW went over listing with sister and informed sister to notify CSW when pt/family have decided on at least 2.  CSW will fax pt's information once pt/family has made their choice on [facility].

## 2014-12-28 ENCOUNTER — Telehealth: Payer: Self-pay | Admitting: Licensed Clinical Social Worker

## 2014-12-28 NOTE — Telephone Encounter (Signed)
CSW received message from pt's sister, Tina Sanders.  Tina Sanders requesting pt's information to be sent to [facility] and provided fax number.  CSW has not received signed FL-2 yet but will contact [facility] to inquire if additional information is requested. [facility] requesting chart information to start pt's admission inquiry.  CSW faxed pt's available chart information.  Sister contacted and informed of above.  CSW will fax FL-2 once signed.

## 2014-12-29 ENCOUNTER — Encounter: Payer: Self-pay | Admitting: Licensed Clinical Social Worker

## 2014-12-29 NOTE — Progress Notes (Signed)
Patient ID: Tina Sanders, female   DOB: 1955-03-29, 59 y.o.   MRN: [id number] Completed Part A of pt's SCAT application received.  CSW completed Part B and faxed both Part A and Part B to SCAT Eligibility office.  [employer] placed for scanning.

## 2014-12-30 ENCOUNTER — Telehealth: Payer: Self-pay | Admitting: Licensed Clinical Social Worker

## 2014-12-30 NOTE — Telephone Encounter (Signed)
CSW placed call to [name] to notify pt/family that FL-2 has been faxed to [facility].  Sister states pt has a home assessment with [facility] today.  CSW inquired about pt's outstanding referrals for Outpatient NeuroRehab.  Sister requesting to hold on referrals at this time based on exploring admission to [facility].  [name] would like pt to receive services once she is admitted to [facility].  If admission falls through, sister would utilize outpatient referrals.  CSW informed [name], current referrals will be cancelled and pt and discuss with PCP if new referrals are needed when pt is ready.

## 2015-01-12 ENCOUNTER — Telehealth: Payer: Self-pay | Admitting: Licensed Clinical Social Worker

## 2015-01-12 NOTE — Telephone Encounter (Signed)
Form completed, placed in your mail box.  Thanks!

## 2015-01-12 NOTE — Telephone Encounter (Signed)
CSW received voice mail from [facility].  Pt has been accepted and is anticipating admission this week.  Facility will need updated FL-2 with specific diet and BP parameters.  FL-2 updated and placed in PCP mailbox requesting BP parameters.  CSW completed [place] and received Level I Determination Notification, no further screening required.  Determination faxed to [facility].

## 2015-01-13 ENCOUNTER — Telehealth: Payer: Self-pay | Admitting: Internal Medicine

## 2015-01-13 NOTE — Telephone Encounter (Signed)
Completed FL-2 faxed to [facility].

## 2015-01-13 NOTE — Telephone Encounter (Addendum)
Amy from [facility] want to know when was the last time the pt TB test done. Please call back.

## 2015-01-13 NOTE — Telephone Encounter (Signed)
Shana, I see your note that pt will need a TB test but from what I see she has not had one done with [place].  Were we coordinating this testing?  Should I make an appointment for this to be done?

## 2015-01-14 ENCOUNTER — Other Ambulatory Visit: Payer: Self-pay | Admitting: Internal Medicine

## 2015-01-14 DIAGNOSIS — Z0000 Encounter for general adult medical examination without abnormal findings: Secondary | ICD-10-CM

## 2015-01-14 NOTE — Telephone Encounter (Signed)
Spoke with Amy, they do require a negative test within 30 days of admission.  They will accept the 2-step tb testing or blood testing.  Marland Kitchen  She will need transportation either way, Shana.  Maybe only one day if we can do the blood testing here.     Dr. Juleen China, would you be okay ordering quantiferon gold testing instead of pt coming in on 2 different days for traditional TB skin testing?  Vickie with lab is checking to see how quickly the blood test comes back.

## 2015-01-14 NOTE — Telephone Encounter (Signed)
CSW spoke with pt's sister, Nadeya Denunzio.  Sister aware of testing needed for placement.  Discussed transportation, sister able to transport, stating "we live across the street, I can bring her."  CSW offered to provide 2 [place] bus passes following appointment to assist with transportation home.  Sister notified [facility] will contact regarding scheduling.

## 2015-01-14 NOTE — Telephone Encounter (Signed)
I put in an order for quant gold TB blood test.  Thanks for your help!

## 2015-01-14 NOTE — Telephone Encounter (Signed)
Yes.  In order for facility placement, pt will need to have had a TB test or chest X-ray within the past 30-60 days.  Thank you for following up.

## 2015-01-14 NOTE — Telephone Encounter (Signed)
It takes 4 days for the blood test to result.  So Dr. Juleen China, will you place a future order for either the 2 step testing or a blood testing.

## 2015-01-15 NOTE — Telephone Encounter (Signed)
Spoke with Rolla Flatten, pt sister.  Advised of the blood testing and they are able to come into clinic anytime to have [place] lab done.  Advised her it will take about 4 days to come back but only require one visit to the clinic.  She will try to get pt in today.

## 2015-01-20 ENCOUNTER — Other Ambulatory Visit (INDEPENDENT_AMBULATORY_CARE_PROVIDER_SITE_OTHER): Payer: Medicare Other

## 2015-01-20 DIAGNOSIS — Z0000 Encounter for general adult medical examination without abnormal findings: Secondary | ICD-10-CM

## 2015-01-20 DIAGNOSIS — E785 Hyperlipidemia, unspecified: Secondary | ICD-10-CM

## 2015-01-22 ENCOUNTER — Encounter: Payer: Self-pay | Admitting: Pulmonary Disease

## 2015-01-22 ENCOUNTER — Ambulatory Visit (INDEPENDENT_AMBULATORY_CARE_PROVIDER_SITE_OTHER): Payer: Medicare Other | Admitting: Pulmonary Disease

## 2015-01-22 ENCOUNTER — Encounter: Payer: Self-pay | Admitting: Licensed Clinical Social Worker

## 2015-01-22 VITALS — BP 105/57 | HR 86 | Temp 98.5°F | Wt 101.6 lb

## 2015-01-22 DIAGNOSIS — B001 Herpesviral vesicular dermatitis: Secondary | ICD-10-CM

## 2015-01-22 DIAGNOSIS — Z8673 Personal history of transient ischemic attack (TIA), and cerebral infarction without residual deficits: Secondary | ICD-10-CM | POA: Diagnosis not present

## 2015-01-22 DIAGNOSIS — I639 Cerebral infarction, unspecified: Secondary | ICD-10-CM

## 2015-01-22 DIAGNOSIS — Z7982 Long term (current) use of aspirin: Secondary | ICD-10-CM | POA: Diagnosis not present

## 2015-01-22 DIAGNOSIS — B009 Herpesviral infection, unspecified: Secondary | ICD-10-CM

## 2015-01-22 NOTE — Progress Notes (Signed)
   Subjective:    Patient ID: Tina Sanders, female    DOB: 1955/10/15, 60 y.o.   MRN: [id number]  HPI [name] is a 60 year old woman with history of GERD, seizures, HTN, B12 deficiency, Vit D deficiency, CVA presenting for evaluation of mouth lesion.  She noticed multiple small grouped vesicles at the right commissure of her lips that started on Monday. Somewhat painful. The vesicles have since opened and drained. No purulent drainage noted. Denies sore throat. Or inner mouth lesions.  Review of Systems Constitutional: no fevers/chills Ears, nose, mouth, throat, and face: no cough Respiratory: no shortness of breath Cardiovascular: no chest pain  Past Medical History  Diagnosis Date  . GERD (gastroesophageal reflux disease)   . Seizures ([place])   . Hypertension   . B12 deficiency   . Vitamin D deficiency disease   . Stroke [facility])     residual right sided deficits, expressive dysphasia    Current Outpatient Prescriptions on File Prior to Visit  Medication Sig Dispense Refill  . albuterol (PROVENTIL) (5 MG/ML) 0.5% nebulizer solution Take 0.5 mLs (2.5 mg total) by nebulization every 6 (six) hours as needed for wheezing or shortness of breath. (Patient not taking: Reported on 11/09/2014) 20 mL 12  . aspirin 81 MG chewable tablet Chew 1 tablet (81 mg total) by mouth daily. 90 tablet 3  . atorvastatin (LIPITOR) 40 MG tablet Take 1 tablet (40 mg total) by mouth daily at 6 PM. 90 tablet 3  . ergocalciferol (VITAMIN D2) 50000 UNITS capsule Take 1 capsule (50,000 Units total) by mouth once a week. 12 capsule 3  . lacosamide (VIMPAT) 200 MG TABS tablet Take 1 tablet (200 mg total) by mouth 2 (two) times daily. 180 tablet 3  . levETIRAcetam (KEPPRA) 750 MG tablet Take 2 tablets (1,500 mg total) by mouth 2 (two) times daily. 360 tablet 3  . Linaclotide (LINZESS) 145 MCG CAPS capsule Take 1 capsule (145 mcg total) by mouth daily. 90 capsule 3  . lisinopril-hydrochlorothiazide  (ZESTORETIC) 10-12.5 MG tablet Take 1 tablet by mouth daily. 90 tablet 3  . megestrol (MEGACE) 400 MG/10ML suspension Take 5 mLs (200 mg total) by mouth 3 (three) times daily with meals. 1350 mL 3  . Multiple Vitamin (MULTIVITAMIN WITH MINERALS) TABS tablet Take 1 tablet by mouth daily.    Marland Kitchen omeprazole (PRILOSEC) 20 MG capsule Take 1 capsule (20 mg total) by mouth daily. 90 capsule 3  . phenytoin (DILANTIN) 50 MG tablet Chew 2 tablets (100 mg total) by mouth 3 (three) times daily. 540 tablet 3  . potassium chloride (K-DUR) 10 MEQ tablet Take 2 tablets (20 mEq total) by mouth daily. 180 tablet 3   No current facility-administered medications on file prior to visit.    Today's Vitals   01/22/15 1025  BP: 105/57  Pulse: 86  Temp: 98.5 F (36.9 C)  TempSrc: Oral  Weight: 101 lb 9.6 oz (46.085 kg)  SpO2: 100%   Objective:   Physical Exam  Constitutional: No distress.  HENT:  Mouth/Throat: Oropharynx is clear and moist. No oropharyngeal exudate.  Pulmonary/Chest: Breath sounds normal. She has no wheezes. She has no rales.  Skin: Skin is warm and dry. No erythema.  Right commissure of lips with crusted over vesicles. Mild tenderness to palpation.   Assessment & Plan:  Please refer to problem based charting.

## 2015-01-22 NOTE — Patient Instructions (Signed)
If your facility does not receive the TB quantiferon results by Sunday afternoon, you may call [phone] and ask for the Internal Medicine Resident On Call.

## 2015-01-22 NOTE — Assessment & Plan Note (Signed)
Assessment: Stable with no changes in focal weaknesses.  Plan: She is moving into an assisted living facility. They are awaiting the results of quantiferon test. Instructed patient and her family member that if the facility does not receive the test results by Sunday afternoon, she can call the on call pager and ask the resident to have the test results faxed to her facility. She will provide the fax number at that time.

## 2015-01-22 NOTE — Progress Notes (Signed)
Patient ID: Tina Sanders, female   DOB: [birth year], 60 y.o.   MRN: [id number] CSW received call from pt's sister, Aadhya Hira requesting CSW to fax copy of FL-2 to [facility].  Per family request, CSW faxed FL-2 to Ms. Vernon Prey at [facility] fax [phone]

## 2015-01-22 NOTE — Progress Notes (Signed)
01-22-2015 1102a I called [facility] and spoke with Dorothy from Hospital Services to check status of Quantiferon TB Gold Assay results. Dorothy verified with the analyzing department, results expected late tonight or early Saturday morning 01-23-2015.  I asked Dorothy to please fax final results to [phone]. Attn: Mia at [facility]. (contact information provided by patient's sister, Joyce). Patient is pending admission on Monday. Dorothy stated the  Analyzing department would fax results as requested.   , PBT   

## 2015-01-22 NOTE — Assessment & Plan Note (Signed)
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 Started on Monday. She is outside the window for treatment.  Plan: -Continue supportive care.

## 2015-01-23 LAB — QUANTIFERON IN TUBE
QFT TB AG MINUS NIL VALUE: 0.01 [IU]/mL
QUANTIFERON MITOGEN VALUE: 5.52 IU/mL
QUANTIFERON TB AG VALUE: 0.03 IU/mL
QUANTIFERON TB GOLD: NEGATIVE
Quantiferon Nil Value: 0.02 IU/mL

## 2015-01-23 LAB — QUANTIFERON TB GOLD ASSAY (BLOOD)

## 2015-01-24 NOTE — Progress Notes (Signed)
Internal Medicine Clinic Attending  Case discussed with Dr. Krall soon after the resident saw the patient.  We reviewed the resident's history and exam and pertinent patient test results.  I agree with the assessment, diagnosis, and plan of care documented in the resident's note. 

## 2015-01-25 ENCOUNTER — Telehealth: Payer: Self-pay | Admitting: Internal Medicine

## 2015-01-25 NOTE — Telephone Encounter (Signed)
   Reason for call:   I received a call from [name]'s sister at 9:30 AM indicating she needed Tina Sanders's TB results faxed to her facility.   Pertinent Data:   Patient going to [facility] facility today  Quantiferon result: negative   Assessment / Plan / Recommendations:   Faxed quantiferon results to [facility] at [phone]. Fax confirmation obtained.    Tina Rude, MD   01/25/2015, 10:04 AM

## 2015-02-17 ENCOUNTER — Other Ambulatory Visit (HOSPITAL_COMMUNITY): Payer: Self-pay | Admitting: Internal Medicine

## 2015-02-17 DIAGNOSIS — J690 Pneumonitis due to inhalation of food and vomit: Secondary | ICD-10-CM

## 2015-02-24 ENCOUNTER — Ambulatory Visit (HOSPITAL_COMMUNITY)
Admission: RE | Admit: 2015-02-24 | Discharge: 2015-02-24 | Disposition: A | Discharge status: Home / Self Care | Payer: Medicare Other | Source: Ambulatory Visit | Attending: Internal Medicine | Admitting: Internal Medicine

## 2015-02-24 DIAGNOSIS — J690 Pneumonitis due to inhalation of food and vomit: Secondary | ICD-10-CM

## 2015-02-24 DIAGNOSIS — R1310 Dysphagia, unspecified: Secondary | ICD-10-CM | POA: Diagnosis present

## 2015-02-27 ENCOUNTER — Emergency Department (HOSPITAL_COMMUNITY): Payer: Medicare Other

## 2015-02-27 ENCOUNTER — Observation Stay (HOSPITAL_COMMUNITY)
Admission: EM | Admit: 2015-02-27 | Discharge: 2015-03-01 | Disposition: A | Discharge status: Nursing Home (Includes ICF) | Payer: Medicare Other | Attending: Internal Medicine | Admitting: Internal Medicine

## 2015-02-27 DIAGNOSIS — T420X1A Poisoning by hydantoin derivatives, accidental (unintentional), initial encounter: Secondary | ICD-10-CM | POA: Diagnosis not present

## 2015-02-27 DIAGNOSIS — E538 Deficiency of other specified B group vitamins: Secondary | ICD-10-CM | POA: Insufficient documentation

## 2015-02-27 DIAGNOSIS — T420X5A Adverse effect of hydantoin derivatives, initial encounter: Secondary | ICD-10-CM | POA: Insufficient documentation

## 2015-02-27 DIAGNOSIS — Z7982 Long term (current) use of aspirin: Secondary | ICD-10-CM | POA: Diagnosis not present

## 2015-02-27 DIAGNOSIS — E559 Vitamin D deficiency, unspecified: Secondary | ICD-10-CM | POA: Insufficient documentation

## 2015-02-27 DIAGNOSIS — K219 Gastro-esophageal reflux disease without esophagitis: Secondary | ICD-10-CM | POA: Diagnosis not present

## 2015-02-27 DIAGNOSIS — G40909 Epilepsy, unspecified, not intractable, without status epilepticus: Secondary | ICD-10-CM

## 2015-02-27 DIAGNOSIS — Z79899 Other long term (current) drug therapy: Secondary | ICD-10-CM | POA: Diagnosis not present

## 2015-02-27 DIAGNOSIS — R7889 Finding of other specified substances, not normally found in blood: Secondary | ICD-10-CM

## 2015-02-27 DIAGNOSIS — R0789 Other chest pain: Secondary | ICD-10-CM | POA: Insufficient documentation

## 2015-02-27 DIAGNOSIS — I10 Essential (primary) hypertension: Secondary | ICD-10-CM | POA: Diagnosis not present

## 2015-02-27 DIAGNOSIS — G9340 Encephalopathy, unspecified: Secondary | ICD-10-CM

## 2015-02-27 DIAGNOSIS — R4182 Altered mental status, unspecified: Principal | ICD-10-CM | POA: Insufficient documentation

## 2015-02-27 DIAGNOSIS — R0781 Pleurodynia: Secondary | ICD-10-CM | POA: Insufficient documentation

## 2015-02-27 DIAGNOSIS — I69351 Hemiplegia and hemiparesis following cerebral infarction affecting right dominant side: Secondary | ICD-10-CM | POA: Insufficient documentation

## 2015-02-27 DIAGNOSIS — I69321 Dysphasia following cerebral infarction: Secondary | ICD-10-CM | POA: Insufficient documentation

## 2015-02-27 DIAGNOSIS — F17210 Nicotine dependence, cigarettes, uncomplicated: Secondary | ICD-10-CM | POA: Diagnosis not present

## 2015-02-27 LAB — I-STAT CHEM 8, ED
BUN: 6 mg/dL (ref 6–20)
CHLORIDE: 106 mmol/L (ref 101–111)
CREATININE: 0.6 mg/dL (ref 0.44–1.00)
Calcium, Ion: 1.09 mmol/L — ABNORMAL LOW (ref 1.13–1.30)
Glucose, Bld: 125 mg/dL — ABNORMAL HIGH (ref 65–99)
HEMATOCRIT: 38 % (ref 36.0–46.0)
HEMOGLOBIN: 12.9 g/dL (ref 12.0–15.0)
POTASSIUM: 3.9 mmol/L (ref 3.5–5.1)
Sodium: 142 mmol/L (ref 135–145)
TCO2: 22 mmol/L (ref 0–100)

## 2015-02-27 LAB — COMPREHENSIVE METABOLIC PANEL
ALBUMIN: 3.7 g/dL (ref 3.5–5.0)
ALT: 7 U/L — ABNORMAL LOW (ref 14–54)
AST: 14 U/L — AB (ref 15–41)
Alkaline Phosphatase: 61 U/L (ref 38–126)
Anion gap: 12 (ref 5–15)
BUN: 6 mg/dL (ref 6–20)
CHLORIDE: 107 mmol/L (ref 101–111)
CO2: 21 mmol/L — AB (ref 22–32)
Calcium: 9.1 mg/dL (ref 8.9–10.3)
Creatinine, Ser: 0.81 mg/dL (ref 0.44–1.00)
GFR calc Af Amer: >60 mL/min (ref >60)
GFR calc non Af Amer: >60 mL/min (ref >60)
GLUCOSE: 130 mg/dL — AB (ref 65–99)
POTASSIUM: 3.9 mmol/L (ref 3.5–5.1)
Sodium: 140 mmol/L (ref 135–145)
Total Bilirubin: 0.3 mg/dL (ref 0.3–1.2)
Total Protein: 6.8 g/dL (ref 6.5–8.1)

## 2015-02-27 LAB — DIFFERENTIAL
BASOS ABS: 0 10*3/uL (ref 0.0–0.1)
BASOS PCT: 0 %
EOS ABS: 0.1 10*3/uL (ref 0.0–0.7)
Eosinophils Relative: 2 %
LYMPHS ABS: 1.8 10*3/uL (ref 0.7–4.0)
Lymphocytes Relative: 22 %
Monocytes Absolute: 0.7 10*3/uL (ref 0.1–1.0)
Monocytes Relative: 9 %
NEUTROS PCT: 67 %
Neutro Abs: 5.6 10*3/uL (ref 1.7–7.7)

## 2015-02-27 LAB — PHENYTOIN LEVEL, TOTAL
PHENYTOIN LVL: 37.5 ug/mL — AB (ref 10.0–20.0)
PHENYTOIN LVL: 40.7 ug/mL — AB (ref 10.0–20.0)

## 2015-02-27 LAB — URINALYSIS, ROUTINE W REFLEX MICROSCOPIC
Bilirubin Urine: NEGATIVE
GLUCOSE, UA: NEGATIVE mg/dL
HGB URINE DIPSTICK: NEGATIVE
KETONES UR: NEGATIVE mg/dL
Leukocytes, UA: NEGATIVE
Nitrite: NEGATIVE
PROTEIN: NEGATIVE mg/dL
Specific Gravity, Urine: 1.013 (ref 1.005–1.030)
pH: 6.5 (ref 5.0–8.0)

## 2015-02-27 LAB — CBC
HCT: 32.8 % — ABNORMAL LOW (ref 36.0–46.0)
HEMOGLOBIN: 11.3 g/dL — AB (ref 12.0–15.0)
MCH: 30.7 pg (ref 26.0–34.0)
MCHC: 34.5 g/dL (ref 30.0–36.0)
MCV: 89.1 fL (ref 78.0–100.0)
Platelets: 343 10*3/uL (ref 150–400)
RBC: 3.68 MIL/uL — ABNORMAL LOW (ref 3.87–5.11)
RDW: 12.8 % (ref 11.5–15.5)
WBC: 8.3 10*3/uL (ref 4.0–10.5)

## 2015-02-27 LAB — RAPID URINE DRUG SCREEN, HOSP PERFORMED
Amphetamines: NOT DETECTED
BARBITURATES: NOT DETECTED
BENZODIAZEPINES: NOT DETECTED
COCAINE: NOT DETECTED
Opiates: NOT DETECTED
TETRAHYDROCANNABINOL: NOT DETECTED

## 2015-02-27 LAB — APTT: APTT: 24 s (ref 24–37)

## 2015-02-27 LAB — ETHANOL: Alcohol, Ethyl (B): <5 mg/dL (ref <5)

## 2015-02-27 LAB — I-STAT TROPONIN, ED: Troponin i, poc: 0 ng/mL (ref 0.00–0.08)

## 2015-02-27 LAB — LIPASE, BLOOD: LIPASE: 24 U/L (ref 11–51)

## 2015-02-27 LAB — PROTIME-INR
INR: 1.18 (ref 0.00–1.49)
Prothrombin Time: 15.2 seconds (ref 11.6–15.2)

## 2015-02-27 LAB — CBG MONITORING, ED: Glucose-Capillary: 120 mg/dL — ABNORMAL HIGH (ref 65–99)

## 2015-02-27 LAB — I-STAT CG4 LACTIC ACID, ED: Lactic Acid, Venous: 1.39 mmol/L (ref 0.5–2.0)

## 2015-02-27 MED ORDER — LISINOPRIL 10 MG PO TABS
10.0000 mg | ORAL_TABLET | Freq: Every day | ORAL | Status: DC
  Administered 2015-03-01: 10 mg via ORAL
  Filled 2015-02-27 (×2): qty 1

## 2015-02-27 MED ORDER — SODIUM CHLORIDE 0.9 % IV SOLN
INTRAVENOUS | Status: AC
Start: 2015-02-27 — End: 2015-02-28
  Administered 2015-02-27: 23:00:00 via INTRAVENOUS

## 2015-02-27 MED ORDER — LISINOPRIL-HYDROCHLOROTHIAZIDE 10-12.5 MG PO TABS: 1.0000 | ORAL_TABLET | Freq: Every day | ORAL | Status: DC

## 2015-02-27 MED ORDER — MEGESTROL ACETATE 400 MG/10ML PO SUSP
200.0000 mg | Freq: Three times a day (TID) | ORAL | Status: DC
  Administered 2015-03-01 (×2): 200 mg via ORAL
  Filled 2015-02-27 (×7): qty 5

## 2015-02-27 MED ORDER — LINACLOTIDE 145 MCG PO CAPS
145.0000 ug | ORAL_CAPSULE | Freq: Every day | ORAL | Status: DC
  Administered 2015-03-01: 145 ug via ORAL
  Filled 2015-02-27 (×2): qty 1

## 2015-02-27 MED ORDER — ONDANSETRON HCL 4 MG/2ML IJ SOLN: 4.0000 mg | Freq: Four times a day (QID) | INTRAMUSCULAR | Status: DC | PRN

## 2015-02-27 MED ORDER — LACOSAMIDE 200 MG PO TABS
200.0000 mg | ORAL_TABLET | Freq: Two times a day (BID) | ORAL | Status: DC
  Administered 2015-02-27 – 2015-03-01 (×3): 200 mg via ORAL
  Filled 2015-02-27 (×4): qty 1

## 2015-02-27 MED ORDER — ASPIRIN 81 MG PO CHEW
81.0000 mg | CHEWABLE_TABLET | Freq: Every day | ORAL | Status: DC
  Administered 2015-03-01: 81 mg via ORAL
  Filled 2015-02-27 (×2): qty 1

## 2015-02-27 MED ORDER — LEVETIRACETAM 750 MG PO TABS
750.0000 mg | ORAL_TABLET | Freq: Two times a day (BID) | ORAL | Status: DC
  Administered 2015-02-27: 750 mg via ORAL
  Filled 2015-02-27 (×3): qty 1

## 2015-02-27 MED ORDER — POTASSIUM CHLORIDE ER 10 MEQ PO TBCR
20.0000 meq | EXTENDED_RELEASE_TABLET | Freq: Every day | ORAL | Status: DC
  Administered 2015-03-01: 20 meq via ORAL
  Filled 2015-02-27 (×2): qty 2

## 2015-02-27 MED ORDER — PANTOPRAZOLE SODIUM 40 MG PO TBEC
40.0000 mg | DELAYED_RELEASE_TABLET | Freq: Every day | ORAL | Status: DC
  Administered 2015-03-01: 40 mg via ORAL
  Filled 2015-02-27 (×2): qty 1

## 2015-02-27 MED ORDER — ONDANSETRON HCL 4 MG PO TABS: 4.0000 mg | ORAL_TABLET | Freq: Four times a day (QID) | ORAL | Status: DC | PRN

## 2015-02-27 MED ORDER — ATORVASTATIN CALCIUM 40 MG PO TABS
40.0000 mg | ORAL_TABLET | Freq: Every day | ORAL | Status: DC
  Administered 2015-02-27 – 2015-02-28 (×2): 40 mg via ORAL
  Filled 2015-02-27 (×2): qty 1

## 2015-02-27 MED ORDER — HYDROCHLOROTHIAZIDE 12.5 MG PO CAPS
12.5000 mg | ORAL_CAPSULE | Freq: Every day | ORAL | Status: DC
  Administered 2015-03-01: 12.5 mg via ORAL
  Filled 2015-02-27 (×2): qty 1

## 2015-02-27 MED ORDER — ENOXAPARIN SODIUM 30 MG/0.3ML ~~LOC~~ SOLN
30.0000 mg | SUBCUTANEOUS | Status: DC
  Administered 2015-03-01: 30 mg via SUBCUTANEOUS
  Filled 2015-02-27 (×2): qty 0.3

## 2015-02-27 NOTE — ED Notes (Signed)
Pt on the bedpan frequently  Co-operative sometimes and sometimes not

## 2015-02-27 NOTE — ED Notes (Signed)
The pt is curently curled up in a ball

## 2015-02-27 NOTE — ED Notes (Signed)
Notified sister Cherysh Luten of admission, [phone]

## 2015-02-27 NOTE — ED Notes (Signed)
Pt presents via [facility] from [facility] for altered mental status, unknown LSN.  EMS arrival pt is nonverbal, unable to follow commands, grimaces when palpating abdomen.  Per Staff at [facility], pt is usually a x 4, walking and talking, independently performs ADLS.  Pt moving all extremities, nonverbal, not following commands.   BP-146/86 P-60s R-18 CBG-92 EKG unremarkable.  20g L Hand.

## 2015-02-27 NOTE — ED Notes (Signed)
Pt dressed herself and ambulated out to hall. Staggered gait, at high risk for fall. Escorted back to room via wheelchair.

## 2015-02-27 NOTE — H&P (Signed)
Date: 02/27/2015               Patient Name:  Tina Sanders MRN: [id number]  DOB: February 03, 1955 Age / Sex: 60 y.o., female   PCP: Jule Ser, DO         Medical Service: Internal Medicine Teaching Service         Attending Physician: Dr. Oval Linsey, MD    First Contact: Dr. Posey Pronto Pager: [phone]  Second Contact: Dr. 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 This occurred around 9:30 AM as she was noted to have fluctuating levels of alertness. There were no associated convulsions. Family members who were present for interview with the ED provider state they last saw her 2 days ago at baseline status and she currently has worse slurring speech and lethargy. She moved to ALF several weeks ago where she has been receiving her Keppra, Vimpat, and Dilantin that she is on since her hospitalization in October 2016 for seizures that occurred after several weeks of noncompliance. On review of ALF MAR appears Dilantin was recently increased to 125 mg 3 times daily from 100 mg 3 times daily. After arrival at the ED initial labs showed a Dilantin level of 37.5 (therapeutic range 10-20) at 0959 and 40.7 on repeat at 1641. CT head was obtained demonstrating chronic left MCA infarct. Chest x-ray, UDS, and other labs including lactic acid grossly unremarkable.  *Patient was alert but oriented only to person with significant dysarthria during interview so history limited to other providers and record review.  Meds: No current facility-administered medications for this encounter.   Current Outpatient Prescriptions  Medication  Sig Dispense Refill  . aspirin 81 MG chewable tablet Chew 1 tablet (81 mg total) by mouth daily. 90 tablet 3  . atorvastatin (LIPITOR) 40 MG tablet Take 1 tablet (40 mg total) by mouth daily at 6 PM. (Patient taking differently: Take 40 mg by mouth at bedtime. ) 90 tablet 3  . ergocalciferol (VITAMIN D2) 50000 UNITS capsule Take 1 capsule (50,000 Units total) by mouth once a week. (Patient taking differently: Take 50,000 Units by mouth every Saturday. ) 12 capsule 3  . lacosamide (VIMPAT) 200 MG TABS tablet Take 1 tablet (200 mg total) by mouth 2 (two) times daily. 180 tablet 3  . levETIRAcetam (KEPPRA) 750 MG tablet Take 2 tablets (1,500 mg total) by mouth 2 (two) times daily. (Patient taking differently: Take 750 mg by mouth 2 (two) times daily. ) 360 tablet 3  . Linaclotide (LINZESS) 145 MCG CAPS capsule Take 1 capsule (145 mcg total) by mouth daily. 90 capsule 3  . lisinopril-hydrochlorothiazide (ZESTORETIC) 10-12.5 MG tablet Take 1 tablet by mouth daily. 90 tablet 3  . megestrol (MEGACE) 400 MG/10ML suspension Take 5 mLs (200 mg total) by mouth 3 (three) times daily with meals. 1350 mL 3  . omeprazole (PRILOSEC) 20 MG capsule Take 1 capsule (20 mg total) by mouth daily. 90 capsule 3  . phenytoin (DILANTIN) 125 MG/5ML suspension Take 125 mg by mouth every 8 (eight) hours. 6am, 2pm 10pm    . potassium chloride (K-DUR) 10 MEQ tablet Take 2 tablets (  20 mEq total) by mouth daily. 180 tablet 3  . albuterol (PROVENTIL) (5 MG/ML) 0.5% nebulizer solution Take 0.5 mLs (2.5 mg total) by nebulization every 6 (six) hours as needed for wheezing or shortness of breath. (Patient not taking: Reported on 02/27/2015) 20 mL 12  . Multiple Vitamin (MULTIVITAMIN WITH MINERALS) TABS tablet Take 1 tablet by mouth daily. (Patient not taking: Reported on 02/27/2015)    . phenytoin (DILANTIN) 50 MG tablet Chew 2 tablets (100 mg total) by mouth 3 (three) times daily. (Patient not taking: Reported on 02/27/2015) 540 tablet 3     Allergies: Allergies as of 02/27/2015 - Review Complete 02/27/2015  Allergen Reaction Noted  . Penicillins Other (See Comments) 10/14/2014   Past Medical History  Diagnosis Date  . GERD (gastroesophageal reflux disease)   . Seizures ([place])   . Hypertension   . B12 deficiency   . Vitamin D deficiency disease   . Stroke [facility])     residual right sided deficits, expressive dysphasia   No past surgical history on file. Family History  Problem Relation Age of Onset  . Hypertension Mother   . Hypertension Father   . Kidney disease Father   . Hyperlipidemia Father   . Hypertension Sister   . Diabetes Sister    Social History   Social History  . Marital Status: Single    Spouse Name: N/A  . Number of Children: N/A  . Years of Education: N/A   Occupational History  . Not on file.   Social History Main Topics  . Smoking status: Current Every Day Smoker -- 0.25 packs/day for 40 years    Types: Cigarettes  . Smokeless tobacco: Not on file  . Alcohol Use: 8.4 oz/week    0 Standard drinks or equivalent, 14 Cans of beer per week     Comment: Sometimes.  . Drug Use: Not on file  . Sexual Activity: Not on file   Other Topics Concern  . Not on file   Social History Narrative    Review of Systems: Review of Systems  Unable to perform ROS: medical condition  No recent new complaints, baseline R sided deficits, baseline dysphagia per records Patient AMS, severe dysarthria, no family or caretakers present at bedside to obtain ROS   Physical Exam: Blood pressure 122/74, pulse 80, temperature 98.4 F (36.9 C), temperature source Rectal, resp. rate 16, SpO2 97 %. GENERAL- alert, NAD, frail appearing elderly woman HEENT- Atraumatic, PERRL, oral mucosa appears moist, poor dentition with gingival inflammation CARDIAC- RRR, no murmurs, rubs or gallops. RESP- CTAB, no wheezes or crackles. ABDOMEN- Soft, mild guarding, normoactive bowel sounds present BACK- Normal curvature,  no paraspinal tenderness, no CVA tenderness, no sacral ulcer or wounds NEURO- horizontal nystagmus, slight right lower facial droop, RUE spastic 4/5 strength LUE 5/5, LE  Hard to assess due to not participatory with exam EXTREMITIES- pulse 2+, no pedal edema. SKIN- Warm, dry, No rash or lesion. PSYCH- oriented only to person   Lab results: Basic Metabolic Panel:  Recent Labs  02/27/15 0959 02/27/15 1029  NA 140 142  K 3.9 3.9  CL 107 106  CO2 21*  --   GLUCOSE 130* 125*  BUN 6 6  CREATININE 0.81 0.60  CALCIUM 9.1  --    Liver Function Tests:  Recent Labs  02/27/15 0959  AST 14*  ALT 7*  ALKPHOS 61  BILITOT 0.3  PROT 6.8  ALBUMIN 3.7    Recent Labs  02/27/15 0959  LIPASE  24   No results for input(s): AMMONIA in the last 72 hours. CBC:  Recent Labs  02/27/15 0959 02/27/15 1029  WBC 8.3  --   NEUTROABS 5.6  --   HGB 11.3* 12.9  HCT 32.8* 38.0  MCV 89.1  --   PLT 343  --    Cardiac Enzymes: No results for input(s): CKTOTAL, CKMB, CKMBINDEX, TROPONINI in the last 72 hours. BNP: No results for input(s): PROBNP in the last 72 hours. D-Dimer: No results for input(s): DDIMER in the last 72 hours. CBG:  Recent Labs  02/27/15 0924  GLUCAP 120*   Hemoglobin A1C: No results for input(s): HGBA1C in the last 72 hours. Fasting Lipid Panel: No results for input(s): CHOL, HDL, LDLCALC, TRIG, CHOLHDL, LDLDIRECT in the last 72 hours. Thyroid Function Tests: No results for input(s): TSH, T4TOTAL, FREET4, T3FREE, THYROIDAB in the last 72 hours. Anemia Panel: No results for input(s): VITAMINB12, FOLATE, FERRITIN, TIBC, IRON, RETICCTPCT in the last 72 hours. Coagulation:  Recent Labs  02/27/15 0959  LABPROT 15.2  INR 1.18   Urine Drug Screen: Drugs of Abuse     Component Value Date/Time   LABOPIA NONE DETECTED 02/27/2015 1015   COCAINSCRNUR NONE DETECTED 02/27/2015 1015   LABBENZ NONE DETECTED 02/27/2015 1015   AMPHETMU NONE DETECTED 02/27/2015 1015    THCU NONE DETECTED 02/27/2015 1015   LABBARB NONE DETECTED 02/27/2015 1015    Alcohol Level:  Recent Labs  02/27/15 0959  ETH <5   Urinalysis:  Recent Labs  02/27/15 [place]  LABSPEC 1.013  PHURINE 6.5  GLUCOSEU NEGATIVE  HGBUR NEGATIVE  BILIRUBINUR NEGATIVE  KETONESUR NEGATIVE  PROTEINUR NEGATIVE  NITRITE NEGATIVE  LEUKOCYTESUR NEGATIVE    Imaging results:  Ct Head Wo Contrast  02/27/2015  CLINICAL DATA:  "Pt presents via [facility] from [facility] for altered mental status, unknown LSN. EMS arrival pt is nonverbal, unable to follow commands, grimaces when palpating abdomen. Per Staff at [facility], pt is usually a x 4, walking and talking. Patient moves all extremities. Patient is nonverbal not following commands. EXAM: CT HEAD WITHOUT CONTRAST TECHNIQUE: Contiguous axial images were obtained from the base of the skull through the vertex without intravenous contrast. COMPARISON:  10/31/2014 FINDINGS: The ventricles are normal in size and configuration. There is mild ex vacuo dilation of the left lateral ventricle due to an old left frontal lobe infarct. There are no parenchymal masses or mass effect. There is no evidence of a recent cortical infarct. There are no extra-axial masses or abnormal fluid collections. There is no intracranial hemorrhage. The visualized sinuses and mastoid air cells are clear. No skull lesion. No change from the prior study. IMPRESSION: 1. No acute intracranial abnormalities. Electronically Signed   By: Lajean Manes M.D.   On: 02/27/2015 10:05   Dg Chest Port 1 View  02/27/2015  CLINICAL DATA:  Altered mental status. EXAM: PORTABLE CHEST 1 VIEW COMPARISON:  11/09/2014 FINDINGS: Cardiac silhouette is normal in size. Aorta is uncoiled. No mediastinal or hilar masses or evidence of adenopathy. Stable atelectasis or scarring at the left lung base. No evidence of pneumonia or pulmonary edema. No pleural effusion or pneumothorax.  Skeletal structures are demineralized but grossly intact. IMPRESSION: No acute cardiopulmonary disease. Electronically Signed   By: Lajean Manes M.D.   On: 02/27/2015 09:35    Other results: EKG: Normal sinus rhythm, low voltage, no progressive ST segment changes, QTC 432  Assessment & Plan by Problem: Dilantin toxicity  Most likely cause of current altered mental status, nystagmus and lethargy could be secondary, unable to assess ataxia at this time. Does have high degree of dysarthria which is less typical; I have no experience with her baseline but this seems worse than reported. -Hold home dilantin -IVNS 162mL/hr -q4hr neuro checks -Cardiac monitoring -Repeat phenytoin level AM  Seizure disorder Possible had seizure explaining LOC but atypical features compared to past events and supertherapeutic on dilantin after dose increase 4 days ago. Keppra down to 750mg  from 1500mg  doses documented at clinic appt with Dr. Randell Patient 01/22/15 unclear when/why. -Continue home Keppra 750mg  BID, Vimpat 200mg  BID -EEG -Neurology consulted, will appreciate recs  Hypertension Stable 110s-130s in ED -Continue Lisinopril-HCTZ  Hx of L MCA CVA w/ R residual deficits No new infarct or progression on studies -Continue ASA, Atorvastatin  Chronic malnutrition, constipation Swallowing is chronically impaired and now with good level of alertness will maintain dysphagia diet. -Con't Megace, linzess, KCl -Zofran PRN  Diet: Dysphagia VTE ppx: [place] enoxparin FULL CODE  Dispo: Disposition is deferred at this time, awaiting improvement of current medical problems. Anticipated discharge in approximately 1-2 day(s).   The patient does have a current PCP Jule Ser, DO) and does not know need an [facility] hospital follow-up appointment after discharge.  The patient does have transportation limitations that hinder transportation to clinic appointments.  Signed: Collier Salina, MD 02/27/2015, 9:29 PM

## 2015-02-27 NOTE — ED Provider Notes (Signed)
6:44 PM Presents to the emergency department for altered mental status.  Found to have an elevated Dilantin level.  Initially was 37.  Repeat level is 40.  I discussed the case with neurology recommends admission to the hospital on IV fluids and discontinuation of Dilantin.  He does recommend continuing her Vimpat as scheduled as well as her Keppra, 1500 mg twice a day.  Neurology will continue to follow in the hospital  Jola Schmidt, MD 02/27/15 1844

## 2015-02-27 NOTE — ED Notes (Signed)
Neurology at bedside for eval

## 2015-02-27 NOTE — Progress Notes (Signed)
EEG completed, results pending. 

## 2015-02-27 NOTE — ED Provider Notes (Signed)
CSN: [id number]     Arrival date & time 02/27/15  0857 History   First MD Initiated Contact with Patient 02/27/15 0900     Chief Complaint  Patient presents with  . Altered Mental Status     (Consider location/radiation/quality/duration/timing/severity/associated sxs/prior Treatment) Patient is a 60 y.o. female presenting with altered mental status. The history is provided by the EMS personnel, a relative and the nursing home.  Altered Mental Status Presenting symptoms: behavior changes and lethargy   Severity:  Moderate Most recent episode:  Today Episode history:  Continuous Timing:  Constant Progression:  Unchanged Chronicity:  New Context: nursing home resident   Associated symptoms: slurred speech (speaking less than normal, slowed) and weakness (nonfocal)   Associated symptoms: normal movement, no fever and no vomiting     Past Medical History  Diagnosis Date  . GERD (gastroesophageal reflux disease)   . Seizures ([place])   . Hypertension   . B12 deficiency   . Vitamin D deficiency disease   . Stroke [facility])     residual right sided deficits, expressive dysphasia   No past surgical history on file. Family History  Problem Relation Age of Onset  . Hypertension Mother   . Hypertension Father   . Kidney disease Father   . Hyperlipidemia Father   . Hypertension Sister   . Diabetes Sister    Social History  Substance Use Topics  . Smoking status: Current Every Day Smoker -- 0.25 packs/day for 40 years    Types: Cigarettes  . Smokeless tobacco: Not on file  . Alcohol Use: 8.4 oz/week    0 Standard drinks or equivalent, 14 Cans of beer per week     Comment: Sometimes.   OB History    No data available     Review of Systems  Unable to perform ROS: Patient nonverbal  Constitutional: Negative for fever.  Gastrointestinal: Negative for vomiting.  Neurological: Positive for weakness (nonfocal).      Allergies  Penicillins  Home Medications   Prior to  Admission medications   Medication Sig Start Date End Date Taking? Authorizing Provider  albuterol (PROVENTIL) (5 MG/ML) 0.5% nebulizer solution Take 0.5 mLs (2.5 mg total) by nebulization every 6 (six) hours as needed for wheezing or shortness of breath. 10/14/14  Yes Jule Ser, DO  aspirin 81 MG chewable tablet Chew 1 tablet (81 mg total) by mouth daily. 12/22/14  Yes Jule Ser, DO  atorvastatin (LIPITOR) 40 MG tablet Take 1 tablet (40 mg total) by mouth daily at 6 PM. 12/22/14  Yes Jule Ser, DO  ergocalciferol (VITAMIN D2) 50000 UNITS capsule Take 1 capsule (50,000 Units total) by mouth once a week. 12/22/14  Yes Jule Ser, DO  lacosamide (VIMPAT) 200 MG TABS tablet Take 1 tablet (200 mg total) by mouth 2 (two) times daily. 12/22/14  Yes Jule Ser, DO  Linaclotide [facility]) 145 MCG CAPS capsule Take 1 capsule (145 mcg total) by mouth daily. 12/22/14  Yes Jule Ser, DO  lisinopril-hydrochlorothiazide (ZESTORETIC) 10-12.5 MG tablet Take 1 tablet by mouth daily. 12/22/14 12/21/15 Yes Jule Ser, DO  megestrol (MEGACE) 400 MG/10ML suspension Take 5 mLs (200 mg total) by mouth 3 (three) times daily with meals. 12/22/14  Yes Jule Ser, DO  phenytoin (DILANTIN) 50 MG tablet Chew 2 tablets (100 mg total) by mouth 3 (three) times daily. 12/22/14  Yes Jule Ser, DO  levETIRAcetam (KEPPRA) 750 MG tablet Take 2 tablets (1,500 mg total) by mouth 2 (two) times daily. 12/22/14  Jule Ser, DO  Multiple Vitamin (MULTIVITAMIN WITH MINERALS) TABS tablet Take 1 tablet by mouth daily. 10/31/14   Iline Oven, MD  omeprazole (PRILOSEC) 20 MG capsule Take 1 capsule (20 mg total) by mouth daily. 12/22/14 12/21/15  Jule Ser, DO  potassium chloride (K-DUR) 10 MEQ tablet Take 2 tablets (20 mEq total) by mouth daily. 12/22/14   Jule Ser, DO   BP 118/67 mmHg  Pulse 81  Temp(Src) 98.4 F (36.9 C) (Rectal)  Resp 14  SpO2 100% Physical Exam   Constitutional: She appears well-developed and well-nourished. No distress.  HENT:  Head: Normocephalic.  Eyes: Conjunctivae are normal.  Neck: Neck supple. No tracheal deviation present.  Cardiovascular: Normal rate, regular rhythm and normal heart sounds.   Pulmonary/Chest: Effort normal. No respiratory distress.  Abdominal: Soft. She exhibits no distension.  Neurological: She is alert. GCS eye subscore is 4. GCS verbal subscore is 4. GCS motor subscore is 6.  Patient with global slowing and chronic deficits with decreased speech per family at bedside who are familiar with her baseline  Skin: Skin is warm and dry.  Psychiatric: She has a normal mood and affect.    ED Course  Procedures (including critical care time) Labs Review Labs Reviewed  CBC - Abnormal; Notable for the following:    RBC 3.68 (*)    Hemoglobin 11.3 (*)    HCT 32.8 (*)    All other components within normal limits  COMPREHENSIVE METABOLIC PANEL - Abnormal; Notable for the following:    CO2 21 (*)    Glucose, Bld 130 (*)    AST 14 (*)    ALT 7 (*)    All other components within normal limits  PHENYTOIN LEVEL, TOTAL - Abnormal; Notable for the following:    Phenytoin Lvl 37.5 (*)    All other components within normal limits  I-STAT CHEM 8, ED - Abnormal; Notable for the following:    Glucose, Bld 125 (*)    Calcium, Ion 1.09 (*)    All other components within normal limits  CBG MONITORING, ED - Abnormal; Notable for the following:    Glucose-Capillary 120 (*)    All other components within normal limits  ETHANOL  PROTIME-INR  APTT  DIFFERENTIAL  URINE RAPID DRUG SCREEN, HOSP PERFORMED  URINALYSIS, ROUTINE W REFLEX MICROSCOPIC (NOT AT [facility])  LIPASE, BLOOD  I-STAT TROPOININ, ED  I-STAT CG4 LACTIC ACID, ED    Imaging Review Ct Head Wo Contrast  02/27/2015  CLINICAL DATA:  "Pt presents via [facility] from [facility] for altered mental status, unknown LSN. EMS arrival pt is nonverbal,  unable to follow commands, grimaces when palpating abdomen. Per Staff at [facility], pt is usually a x 4, walking and talking. Patient moves all extremities. Patient is nonverbal not following commands. EXAM: CT HEAD WITHOUT CONTRAST TECHNIQUE: Contiguous axial images were obtained from the base of the skull through the vertex without intravenous contrast. COMPARISON:  10/31/2014 FINDINGS: The ventricles are normal in size and configuration. There is mild ex vacuo dilation of the left lateral ventricle due to an old left frontal lobe infarct. There are no parenchymal masses or mass effect. There is no evidence of a recent cortical infarct. There are no extra-axial masses or abnormal fluid collections. There is no intracranial hemorrhage. The visualized sinuses and mastoid air cells are clear. No skull lesion. No change from the prior study. IMPRESSION: 1. No acute intracranial abnormalities. Electronically Signed   By: Lajean Manes  M.D.   On: 02/27/2015 10:05   Dg Chest Port 1 View  02/27/2015  CLINICAL DATA:  Altered mental status. EXAM: PORTABLE CHEST 1 VIEW COMPARISON:  11/09/2014 FINDINGS: Cardiac silhouette is normal in size. Aorta is uncoiled. No mediastinal or hilar masses or evidence of adenopathy. Stable atelectasis or scarring at the left lung base. No evidence of pneumonia or pulmonary edema. No pleural effusion or pneumothorax. Skeletal structures are demineralized but grossly intact. IMPRESSION: No acute cardiopulmonary disease. Electronically Signed   By: Lajean Manes M.D.   On: 02/27/2015 09:35   I have personally reviewed and evaluated these images and lab results as part of my medical decision-making.   EKG Interpretation   Date/Time:  Saturday February 27 2015 09:06:34 EST Ventricular Rate:  78 PR Interval:  166 QRS Duration: 90 QT Interval:  379 QTC Calculation: 432 R Axis:   -73 Text Interpretation:  Sinus rhythm Left axis deviation Low voltage,  extremity leads Interpretation limited  secondary to artifact Since last  tracing rate slower Otherwise no significant change Confirmed by Cerina Leary MD,  Conrad Zajkowski [id number]) on 02/27/2015 9:11:55 AM      MDM   Final diagnoses:  Altered mental status    60 y.o. female presents with altered mental status from home. He was last seen in her baseline 2 days ago by her family. Assisting living facility she was unable to contribute much further to the history. The patient has a history of stroke and a history of seizures, is actively on Vimpat, Keppra, Dilantin. Dilantin level was ordered and is elevated. Patient does not appear to be actively seizing. CT scan without any acute findings, No significant hematologic or metabolic abnormalities to explain symptoms. No evidence of infection on chest x-ray or urine. Neurology was consulted for evaluation and they recommended bedside EEG in the emergency department and repeat Dilantin level after being able to metabolize. Neurology recommending discontinuation of dilantin. Plan for repeat level and will re-engage neurology prior to discharge for final medication recommendations if level is appropriately low and patient not found to be seizing on monitoring.   Care transferred to Dr Venora Maples at 1600 pending repeat level and final disposition.     Leo Grosser, MD 02/27/15 [phone]

## 2015-02-27 NOTE — Consult Note (Signed)
Requesting Physician: Dr.  Laneta Simmers    Reason for consultation:  Altered mental status  HPI:                                                                                                                                         Tina Sanders is an 60 y.o. female patient who  Was sent to the emergency room for further evaluation of altered mental status in syncopal event this morning. Patient lives at [facility] assisted living facility. Patient unable to provide any history.  I called the [facility] facility and spoke to one of the nurses there who about the patient.  Per report, she came down to eat her breakfast around 9:30 AM,  And in the  dining [place],  She passed out fell on the floor.  No convulsive activity is noted.  Her level of alertness fluctuated for several minutes,  And she was mumbling  Incomprehensible words,  As reported by her nurse at the assisted living facility.  No definite  history of any convulsive activity noted.    patient has a history of chronic left MCA infarct today she was previously hospitalized in October 2016 for seizures, had abnormal EEGs at that time.  Per  Hospital EMR,  She should be supposedly on Keppra 1500 minutes twice a day Dilantin 100 mg 3 times a day and Vimpat 200 mg twice a day. But the assisted-living facility nurse reviewed her chart at the facility and informed that she is actually on Keppra 750 twice a day takes at 8 AM and 8 PM,  Not 1500 twice a day,  And Dilantin is 125 mg 3 times a day, which she takes at 6 AM, 2 PM and 10 PM,  And Vimpat 200 mg twice a day at 8 AM and 8 PM.    The Dilantin level checked at 10 AM in the ER , which is reportedly 4 hours after her morning dose of Dilantin was extremely high at  37.5 ( therapeutic reference range is between 10-20).    Past Medical History: Past Medical History  Diagnosis Date  . GERD (gastroesophageal reflux disease)   . Seizures ([place])   . Hypertension   . B12 deficiency   . Vitamin D  deficiency disease   . Stroke [facility])     residual right sided deficits, expressive dysphasia    No past surgical history on file.  Family History: Family History  Problem Relation Age of Onset  . Hypertension Mother   . Hypertension Father   . Kidney disease Father   . Hyperlipidemia Father   . Hypertension Sister   . Diabetes Sister     Social History:   reports that she has been smoking Cigarettes.  She has a 10 pack-year smoking history. She does not have any smokeless tobacco history on file. She reports that she drinks about 8.4 oz  of alcohol per week. Her drug history is not on file.  Allergies:  Allergies  Allergen Reactions  . Penicillins Other (See Comments)    abdominal bloating Has patient had a PCN reaction causing immediate rash, facial/tongue/throat swelling, SOB or lightheadedness with hypotension: No Has patient had a PCN reaction causing severe rash involving mucus membranes or skin necrosis: No Has patient had a PCN reaction that required hospitalization No Has patient had a PCN reaction occurring within the last 10 years: No If all of the above answers are "NO", then may proceed with Cephalosporin use.     Medications:                                                                                                                        No current facility-administered medications for this encounter.  Current outpatient prescriptions:  .  albuterol (PROVENTIL) (5 MG/ML) 0.5% nebulizer solution, Take 0.5 mLs (2.5 mg total) by nebulization every 6 (six) hours as needed for wheezing or shortness of breath. (Patient not taking: Reported on 11/09/2014), Disp: 20 mL, Rfl: 12 .  aspirin 81 MG chewable tablet, Chew 1 tablet (81 mg total) by mouth daily., Disp: 90 tablet, Rfl: 3 .  atorvastatin (LIPITOR) 40 MG tablet, Take 1 tablet (40 mg total) by mouth daily at 6 PM., Disp: 90 tablet, Rfl: 3 .  ergocalciferol (VITAMIN D2) 50000 UNITS capsule, Take 1 capsule (50,000  Units total) by mouth once a week., Disp: 12 capsule, Rfl: 3 .  lacosamide (VIMPAT) 200 MG TABS tablet, Take 1 tablet (200 mg total) by mouth 2 (two) times daily., Disp: 180 tablet, Rfl: 3 .  levETIRAcetam (KEPPRA) 750 MG tablet, Take 2 tablets (1,500 mg total) by mouth 2 (two) times daily., Disp: 360 tablet, Rfl: 3 .  Linaclotide (LINZESS) 145 MCG CAPS capsule, Take 1 capsule (145 mcg total) by mouth daily., Disp: 90 capsule, Rfl: 3 .  lisinopril-hydrochlorothiazide (ZESTORETIC) 10-12.5 MG tablet, Take 1 tablet by mouth daily., Disp: 90 tablet, Rfl: 3 .  megestrol (MEGACE) 400 MG/10ML suspension, Take 5 mLs (200 mg total) by mouth 3 (three) times daily with meals., Disp: 1350 mL, Rfl: 3 .  Multiple Vitamin (MULTIVITAMIN WITH MINERALS) TABS tablet, Take 1 tablet by mouth daily., Disp: , Rfl:  .  omeprazole (PRILOSEC) 20 MG capsule, Take 1 capsule (20 mg total) by mouth daily., Disp: 90 capsule, Rfl: 3 .  phenytoin (DILANTIN) 50 MG tablet, Chew 2 tablets (100 mg total) by mouth 3 (three) times daily., Disp: 540 tablet, Rfl: 3 .  potassium chloride (K-DUR) 10 MEQ tablet, Take 2 tablets (20 mEq total) by mouth daily., Disp: 180 tablet, Rfl: 3   ROS:  History  unobtainable from patient due to mental status and  possible aphasia from prior stroke   Neurologic Examination:                                                                                                      Blood pressure 126/73, pulse 84, temperature 98.4 F (36.9 C), temperature source Rectal, resp. rate 18, SpO2 99 %.  Evaluation of higher integrative functions including: Level of alertness: Alert,   unable to  Assess orientation to time, place and person due to aphasia,  The patient appeared to readily recognize physician and followed commands Speech:  Spoke incomprehensible mumbling type of words,   Possible motor aphasia although her baseline is not known the examiner.  Chronic left MCA infarct on CT brain.  Follow simple verbal commands..  Test the following cranial nerves: 2-12 grossly intact Motor examination:  Spastic in the right upper and lower extremities, able to demonstrate full 5/5 motor strength in all 4 extremities Examination of sensation : unable to assesss Examination of deep tendon reflexes: 3+, on the right, 2+ on the left, normal plantars bilaterally Test coordination: Normal finger nose testing, with no evidence of limb appendicular ataxia or abnormal involuntary movements or tremors noted.  Gait: Deferred   Lab Results: Basic Metabolic Panel:  Recent Labs Lab 02/27/15 1029  NA 142  K 3.9  CL 106  GLUCOSE 125*  BUN 6  CREATININE 0.60    Liver Function Tests: No results for input(s): AST, ALT, ALKPHOS, BILITOT, PROT, ALBUMIN in the last 168 hours. No results for input(s): LIPASE, AMYLASE in the last 168 hours. No results for input(s): AMMONIA in the last 168 hours.  CBC:  Recent Labs Lab 02/27/15 0959 02/27/15 1029  WBC 8.3  --   NEUTROABS 5.6  --   HGB 11.3* 12.9  HCT 32.8* 38.0  MCV 89.1  --   PLT 343  --     Cardiac Enzymes: No results for input(s): CKTOTAL, CKMB, CKMBINDEX, TROPONINI in the last 168 hours.  Lipid Panel: No results for input(s): CHOL, TRIG, HDL, CHOLHDL, VLDL, LDLCALC in the last 168 hours.  CBG:  Recent Labs Lab 02/27/15 0924  GLUCAP 120*    Microbiology: Results for orders placed or performed during the hospital encounter of 11/09/14  Urine culture     Status: None   Collection Time: 11/09/14 12:14 PM  Result Value Ref Range Status   Specimen Description URINE, CLEAN CATCH  Final   Special Requests NONE  Final   Culture MULTIPLE SPECIES PRESENT, SUGGEST RECOLLECTION  Final   Report Status 11/10/2014 FINAL  Final     Imaging: Ct Head Wo Contrast  02/27/2015  CLINICAL DATA:  "Pt presents via [facility] from  [facility] for altered mental status, unknown LSN. EMS arrival pt is nonverbal, unable to follow commands, grimaces when palpating abdomen. Per Staff at [facility], pt is usually a x 4, walking and talking. Patient moves all extremities. Patient is nonverbal not following commands. EXAM: CT HEAD WITHOUT CONTRAST TECHNIQUE: Contiguous axial images were obtained  from the base of the skull through the vertex without intravenous contrast. COMPARISON:  10/31/2014 FINDINGS: The ventricles are normal in size and configuration. There is mild ex vacuo dilation of the left lateral ventricle due to an old left frontal lobe infarct. There are no parenchymal masses or mass effect. There is no evidence of a recent cortical infarct. There are no extra-axial masses or abnormal fluid collections. There is no intracranial hemorrhage. The visualized sinuses and mastoid air cells are clear. No skull lesion. No change from the prior study. IMPRESSION: 1. No acute intracranial abnormalities. Electronically Signed   By: Lajean Manes M.D.   On: 02/27/2015 10:05   Dg Op Swallowing Func-medicare/speech Path  02/24/2015  Objective Swallowing Evaluation: Type of Study: MBS-Modified Barium Swallow Study Patient Details Name: Tina Sanders MRN: [id number] Date of Birth: 01-12-1955 Today's Date: 02/24/2015 Time: SLP Start Time (ACUTE ONLY): 1125-SLP Stop Time (ACUTE ONLY): 1133 SLP Time Calculation (min) (ACUTE ONLY): 8 min Past Medical History: Past Medical History Diagnosis Date . GERD (gastroesophageal reflux disease)  . Seizures ([place])  . Hypertension  . B12 deficiency  . Vitamin D deficiency disease  . Stroke [facility])    residual right sided deficits, expressive dysphasia Past Surgical History: No past surgical history on file. HPI: 60 year old femal with history of HTN, GERD, seizures and CVA with residual right sided weakness.  Patient presents for objective assessment of swallow function.  Subjective: patient with driver from  outside facility, walking but appeared impulsive during assessment Assessment / Plan / Recommendation CHL IP CLINICAL IMPRESSIONS 02/24/2015 Therapy Diagnosis Mild pharyngeal phase dysphagia Clinical Impression Patient demonstrates a mild pharyngeal dysphagia that is compounded by her cognitive deficits.  Patient impulsive with portions and pacing of self-feeding along with delayed swallow initiation to the pharynx leads to sensed aspiration of thin liquids and flash penetration of nectar-thick liquids. Cues attempted but patient unable to follow commands to increase safety with thin liquids.  Recommend SLP follow up to maximize safety with small portions and slow pace. Given that patient has sensed aspiration SLP can upgrade in patient's setting.   Impact on safety and function Moderate aspiration risk   CHL IP TREATMENT RECOMMENDATION 02/24/2015 Treatment Recommendations Defer treatment plan to f/u with SLP   Prognosis 02/24/2015 Prognosis for Safe Diet Advancement [place] Barriers to Reach Goals Cognitive deficits;Language deficits;Time post onset;Behavior Barriers/Prognosis Comment -- CHL IP DIET RECOMMENDATION 02/24/2015 SLP Diet Recommendations Dysphagia 3 (Mech soft) solids;Nectar thick liquid;Other (Comment) Liquid Administration via Cup;No straw Medication Administration Whole meds with puree Compensations Minimize environmental distractions;Slow rate;Small sips/bites Postural Changes Seated upright at 90 degrees   CHL IP OTHER RECOMMENDATIONS 02/24/2015 Recommended Consults -- Oral Care Recommendations Oral care BID;Other (Comment) Other Recommendations Prohibited food (jello, ice cream, thin soups)   CHL IP FOLLOW UP RECOMMENDATIONS 02/24/2015 Follow up Recommendations 24 hour supervision/assistance;Skilled Nursing facility   [facility] IP FREQUENCY AND DURATION 10/29/2014 Speech Therapy Frequency (ACUTE ONLY) min 2x/week Treatment Duration --      CHL IP ORAL PHASE 02/24/2015 Oral Phase Impaired Oral - Pudding Teaspoon  -- Oral - Pudding Cup -- Oral - Honey Teaspoon -- Oral - Honey Cup -- Oral - Nectar Teaspoon -- Oral - Nectar Cup -- Oral - Nectar Straw -- Oral - Thin Teaspoon -- Oral - Thin Cup -- Oral - Thin Straw -- Oral - Puree WFL Oral - Mech Soft WFL Oral - Regular Impaired mastication Oral - Multi-Consistency -- Oral - Pill -- Oral Phase - Comment good  control of liquid boluses  CHL IP PHARYNGEAL PHASE 02/24/2015 Pharyngeal Phase Impaired Pharyngeal- Pudding Teaspoon -- Pharyngeal -- Pharyngeal- Pudding Cup -- Pharyngeal -- Pharyngeal- Honey Teaspoon -- Pharyngeal -- Pharyngeal- Honey Cup -- Pharyngeal -- Pharyngeal- Nectar Teaspoon -- Pharyngeal -- Pharyngeal- Nectar Cup Delayed swallow initiation-vallecula;Delayed swallow initiation-pyriform sinuses;Reduced airway/laryngeal closure;Pharyngeal residue - valleculae;Compensatory strategies attempted (with notebox);Penetration/Aspiration before swallow Pharyngeal Material enters airway, remains ABOVE vocal cords then ejected out Pharyngeal- Nectar Straw -- Pharyngeal -- Pharyngeal- Thin Teaspoon -- Pharyngeal -- Pharyngeal- Thin Cup Delayed swallow initiation-vallecula;Delayed swallow initiation-pyriform sinuses;Reduced airway/laryngeal closure;Penetration/Aspiration before swallow;Penetration/Aspiration during swallow;Moderate aspiration;Pharyngeal residue - valleculae;Compensatory strategies attempted (with notebox) Pharyngeal Material enters airway, passes BELOW cords and not ejected out despite cough attempt by patient Pharyngeal- Thin Straw -- Pharyngeal -- Pharyngeal- Puree -- Pharyngeal -- Pharyngeal- Mechanical Soft -- Pharyngeal -- Pharyngeal- Regular -- Pharyngeal -- Pharyngeal- Multi-consistency -- Pharyngeal -- Pharyngeal- Pill -- Pharyngeal -- Pharyngeal Comment cognitive-linguistic deficits appear to impact ability to follow commands and utilize recommended safe swallow strateies consistently   CHL IP CERVICAL ESOPHAGEAL PHASE 02/24/2015 Cervical Esophageal Phase  WFL Pudding Teaspoon -- Pudding Cup -- Honey Teaspoon -- Honey Cup -- Nectar Teaspoon -- Nectar Cup -- Nectar Straw -- Thin Teaspoon -- Thin Cup -- Thin Straw -- Puree -- Mechanical Soft -- Regular -- Multi-consistency -- Pill -- Cervical Esophageal Comment -- CHL IP GO 02/24/2015 Functional Assessment Tool Used MBS with skilled clinical judgement Functional Limitations Swallowing Swallow Current Status [id number]) CJ Swallow Goal Status [id number]) [place] Swallow Discharge Status [id number]) CJ Motor Speech Current Status [id number]) (None) Motor Speech Goal Status [id number]) (None) Motor Speech Goal Status [id number]) (None) Spoken Language Comprehension Current Status [id number]) (None) Spoken Language Comprehension Goal Status [id number]) (None) Spoken Language Comprehension Discharge Status [id number]) (None) Spoken Language Expression Current Status [id number]) (None) Spoken Language Expression Goal Status [id number]) (None) Spoken Language Expression Discharge Status [id number]) (None) Attention Current Status [id number]) (None) Attention Goal Status [id number]) (None) Attention Discharge Status [id number]) (None) Memory Current Status [id number]) (None) Memory Goal Status [id number]) (None) Memory Discharge Status QC:[id number]) (None) Voice Current Status [id number]) (None) Voice Goal Status [id number]) (None) Voice Discharge Status [id number]) (None) Other Speech-Language Pathology Functional Limitation [id number]) (None) Other Speech-Language Pathology Functional Limitation Goal Status [id number]) (None) Other Speech-Language Pathology Functional Limitation Discharge Status [phone]) (None) Gunnar Fusi, M.A., CCC-SLP [phone] [place] 02/24/2015, 3:16 PM            CLINICAL DATA:  Confusion, aspiration pneumonia. EXAM: MODIFIED BARIUM SWALLOW TECHNIQUE: Different consistencies of barium were administered orally to the patient by the Speech Pathologist. Imaging of the pharynx was performed in the lateral projection. FLUOROSCOPY TIME:  Radiation Exposure Index (as provided by the fluoroscopic  device): If the device does not provide the exposure index: Fluoroscopy Time:  1 minutes 43 seconds. Number of Acquired Images:  None. COMPARISON:  None. FINDINGS: Thin liquid- flash penetration with silent aspiration. Nectar thick liquid- flash penetration with minimal retention. No aspiration. Honey- not assessed. Pure- within normal limits Cracker-within normal limits Pure with cracker- within normal limits Barium tablet -  not assessed. IMPRESSION: Silent aspiration with thin liquids. Nectar thick liquids are recommended. Please refer to the Speech Pathologists report for complete details and recommendations. Electronically Signed   By: Lorin Picket M.D.   On: 02/24/2015 13:40   Dg Chest Port 1 View  02/27/2015  CLINICAL DATA:  Altered mental status. EXAM: PORTABLE CHEST 1 VIEW COMPARISON:  11/09/2014 FINDINGS: Cardiac silhouette is normal in size. Aorta is uncoiled. No mediastinal  or hilar masses or evidence of adenopathy. Stable atelectasis or scarring at the left lung base. No evidence of pneumonia or pulmonary edema. No pleural effusion or pneumothorax. Skeletal structures are demineralized but grossly intact. IMPRESSION: No acute cardiopulmonary disease. Electronically Signed   By: Lajean Manes M.D.   On: 02/27/2015 09:35    Assessment and plan:   Baran Meincke is an 59 y.o. female patient who  Was sent to the emergency room from the  [facility] living facility after she passed out  And had fluctuating level of alertness at about 9:30 AM this morning when she was in the dining hall.  No definite history of convulsive activity per the nurse at [facility].  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  She is currently on 3 antiepileptic medications. She should be supposedly on Keppra 1500 minutes twice a day Dilantin 100 mg 3 times a day and Vimpat 200 mg twice a day. But the assisted-living facility  nurse reviewed her chart at the facility and informed that she is actually on Keppra 750 twice a day takes at 8 AM and 8 PM,  Not 1500 twice a day,  And Dilantin is 125 mg 3 times a day, which she takes at 6 AM, 2 PM and 10 PM,  And Vimpat 200 mg twice a day at 8 AM and 8 PM.  The Dilantin level checked at 10 AM in the ER , which is reportedly 4 hours after her morning dose of Dilantin was extremely high at  37.5 ( therapeutic reference range is between 10-20).   during my evaluation, she appears alert able to follow simple commands without difficulty but does have motor aphasia,  Mumbling words which are incomprehensible. Her baseline speech is not known  To me to assess if this is different from her baseline.  CT of the head done in the ER did not show evidence of acute pathology,  Chronic left MCA infarct again noted.     The differential for her syncope or altered mental status this morning could be one of the two  Possible problems,  Either secondary to a complete partial seizure given her prior history of seizures from the left MCA chronic infarct vs  Externally high Dilantin level of 37.5 causing the symptoms.    recommended EEG To rule out  abnormalities at this time.   Recheck Dilantin level late in the afternoon. If the Dilantin level is less than 20 and the patient is able to ambulate safely,  Then she can be discharged back to assisted living facility.   Recommend discontinuing Dilantin at this time due to  Noted difficulty with managing her levels With frequent monitoring.    she was supposedly on Keppra 1500 twice a day per [place] although she is only on 750 twice a day at the assisted living facility. Not sure if this was an accidental dose change, or  It was reduced due to any side effects. Nevertheless, recommend changing the dose back to 1500 twice a day ,  And continue Vimpat 200 mg twice a day.    we'll follow-up EEG.

## 2015-02-27 NOTE — ED Notes (Signed)
MD at bedside. 

## 2015-02-27 NOTE — ED Notes (Signed)
Dinner tray ordered.

## 2015-02-28 DIAGNOSIS — I69351 Hemiplegia and hemiparesis following cerebral infarction affecting right dominant side: Secondary | ICD-10-CM

## 2015-02-28 DIAGNOSIS — R4182 Altered mental status, unspecified: Secondary | ICD-10-CM | POA: Diagnosis not present

## 2015-02-28 DIAGNOSIS — G40909 Epilepsy, unspecified, not intractable, without status epilepticus: Secondary | ICD-10-CM | POA: Diagnosis not present

## 2015-02-28 DIAGNOSIS — T420X1A Poisoning by hydantoin derivatives, accidental (unintentional), initial encounter: Secondary | ICD-10-CM | POA: Diagnosis not present

## 2015-02-28 DIAGNOSIS — T420X1D Poisoning by hydantoin derivatives, accidental (unintentional), subsequent encounter: Secondary | ICD-10-CM | POA: Diagnosis not present

## 2015-02-28 DIAGNOSIS — I69392 Facial weakness following cerebral infarction: Secondary | ICD-10-CM

## 2015-02-28 DIAGNOSIS — I10 Essential (primary) hypertension: Secondary | ICD-10-CM

## 2015-02-28 DIAGNOSIS — K5900 Constipation, unspecified: Secondary | ICD-10-CM

## 2015-02-28 DIAGNOSIS — I69322 Dysarthria following cerebral infarction: Secondary | ICD-10-CM

## 2015-02-28 DIAGNOSIS — R0781 Pleurodynia: Secondary | ICD-10-CM | POA: Insufficient documentation

## 2015-02-28 DIAGNOSIS — E46 Unspecified protein-calorie malnutrition: Secondary | ICD-10-CM

## 2015-02-28 LAB — PHENYTOIN LEVEL, TOTAL
PHENYTOIN LVL: 35.3 ug/mL — AB (ref 10.0–20.0)
Phenytoin Lvl: 36.7 ug/mL (ref 10.0–20.0)

## 2015-02-28 MED ORDER — IBUPROFEN 200 MG PO TABS
400.0000 mg | ORAL_TABLET | Freq: Four times a day (QID) | ORAL | Status: DC | PRN
Start: 2015-02-28 — End: 2015-03-01

## 2015-02-28 MED ORDER — LORAZEPAM 2 MG/ML IJ SOLN: 0.5000 mg | Freq: Four times a day (QID) | INTRAMUSCULAR | Status: DC | PRN

## 2015-02-28 MED ORDER — LEVETIRACETAM 500 MG PO TABS
1500.0000 mg | ORAL_TABLET | Freq: Two times a day (BID) | ORAL | Status: DC
  Administered 2015-02-28 – 2015-03-01 (×2): 1500 mg via ORAL
  Filled 2015-02-28 (×2): qty 3

## 2015-02-28 NOTE — Progress Notes (Signed)
   Subjective: Patient complains of left sided chest wall pain. She does not feel near her baseline. Family also reports that patient is improved compared to yesterday, but still altered. She usually is able to form complete sentences.  Objective: Vital signs in last 24 hours: Filed Vitals:   02/27/15 2030 02/27/15 2115 02/27/15 2205 02/28/15 0513  BP: 122/74 121/71 118/72 103/71  Pulse: 80 88 72 84  Temp:   97.7 F (36.5 C) 98.5 F (36.9 C)  TempSrc:   Oral Oral  Resp:   16 16  Weight:   103 lb 13.4 oz (47.1 kg)   SpO2: 97% 96% 100% 100%   Weight change:  No intake or output data in the 24 hours ending 02/28/15 1332 General: resting in bed, pleasant HEENT: EOMI intact with no appreciable nystagmus, however difficult to assess as patient not following commands well, slight swelling of left corner of mouth, no swelling appreciated in oropharynx, grinding teeth Cardiac: RRR, no rubs, murmurs or gallops Chest: tenderness left chest wall with erythema Pulm: clear to auscultation bilaterally, moving normal volumes of air Abd: soft, nontender, nondistended, BS present Neuro: alert, dysarthric   Assessment/Plan: Principal Problem:   Dilantin toxicity Active Problems:   Essential hypertension   Seizure disorder ([place])  60 year old woman with past medical history of left MCA stroke 2015 w/ R residual deficits, seizure disorder, hypertension presents from [facility] after reportedly passing out in dining area. Dilantin level was elevated at 37.5 (therapeutic range 10-20) at 0959 and 40.7 on repeat at 1641.  Dilantin toxicity: Per ALF [facility], patient's Dilantin (Phenytoin) was increased from 100 mg chewable tablets TID to 125 mg/31mL suspension every 8 hours. This change was made on 02/23/15 for unknown reasons. She was also on Keppra 1500 mg BID which was reduced to 750 mg BID at ALF. Neurology has seen patient and recommend returning to Keppra 1500 mg BID and continuing  Vimpat at 200 mg BID. The chronology of symptoms with change in Dilantin dosing is most consistent with symptoms due to toxicity. There is concern for seizure activity as well with regards to her altered mental status and LOC. EEG was performed, pending results. -Holding Dilantin -Dilantin level this morning at 36.7 -Repeat Dilantin level in the AM -Continue supportive care -Neuro checks q4h -bed rest -monitor mental status  Seizure disorder: As above, seizure activity is a possible etiology for patient's symptoms. EEG is performed. Will continue to hold Dilantin and continue Keppra and Vimpat per Neuro recommendations. -Continue home Vimpat 200 mg BID -Increase Keppra to 1500 mg BID -f/u EEG  Left sided rib pain: Patient complains of left sided chest wall pain. On exam there is tenderness to palpation and erythema at the mid-lateral chest wall. Patient did have reported fall and there is concern for rib injury. -Check Xray left ribs -Ibuprofen 400 mg q6h prn   Dispo: Disposition is deferred at this time, awaiting improvement of current medical problems and normalization of Dilantin levels.  Anticipated discharge in approximately 1-3 day(s).   The patient does have a current PCP Jule Ser, DO) and does need an [facility] hospital follow-up appointment after discharge.      Zada Finders, MD 02/28/2015, 1:32 PM

## 2015-02-28 NOTE — Progress Notes (Signed)
Internal Medicine Attending  Date: 02/28/2015  Patient name: Tina Sanders Medical record number: [id number] Date of birth: July 09, 1955 Age: 60 y.o. Gender: female  I saw and evaluated the patient. I reviewed the resident's note by Dr. Posey Pronto and I agree with the resident's findings and plans as documented in his progress note.  Please see my H&P dated 02/28/2015 and attached to Dr. Marveen Reeks H&P dated 02/27/2015 for the specifics of my evaluation, assessment, and plan from earlier today.

## 2015-02-28 NOTE — Progress Notes (Signed)
Interval History:                                                                                                                      Tina Sanders is an 60 y.o. 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 Post stroke eplilepsy , on 3 sz meds, incl keppra, vimpat, and dilantin as described in my consult notes.  Dilantin level was supra therapeutic at admission, was 40.7, and remained high at 36.7 this morning, and 35.3 in the evening.  Patient had no sz during this hospitalization. She has occasional agitation during nursing care.  Dilantin discontinued, on keppra and vimpat.   EEG showed focal slowing over the left temporal region expected from her chronic left MCA infarct, no seizures.    Past Medical History: Past Medical History  Diagnosis Date  . GERD (gastroesophageal reflux disease)   . Seizures ([place])   . Hypertension   . B12 deficiency   . Vitamin D deficiency disease   . Stroke [facility])     residual right sided deficits, expressive dysphasia    No past surgical history on file.  Family History: Family History  Problem Relation Age of Onset  . Hypertension Mother   . Hypertension Father   . Kidney disease Father   . Hyperlipidemia Father   . Hypertension Sister   . Diabetes Sister     Social History:   reports that she has been smoking Cigarettes.  She has a 10 pack-year smoking history. She does not have any smokeless tobacco history on file. She reports that she drinks about 8.4 oz of alcohol per week. Her drug history is not on file.  Allergies:  Allergies  Allergen Reactions  . Penicillins Other (See Comments)    abdominal bloating Has patient had a PCN reaction causing immediate rash, facial/tongue/throat swelling, SOB or lightheadedness with hypotension: No Has patient had a PCN reaction causing severe rash involving mucus membranes or skin necrosis: No Has patient had a PCN reaction that required  hospitalization No Has patient had a PCN reaction occurring within the last 10 years: No If all of the above answers are "NO", then may proceed with Cephalosporin use.      Medications:                                                                                                                         Current facility-administered medications:  .  0.9 %  sodium chloride infusion, , Intravenous, Continuous, Lucious Groves, DO, Last Rate: 100 mL/hr at 02/27/15 2245 .  aspirin chewable tablet 81 mg, 81 mg, Oral, Daily, Lucious Groves, DO, 81 mg at 02/28/15 1236 .  atorvastatin (LIPITOR) tablet 40 mg, 40 mg, Oral, QHS, Lucious Groves, DO, 40 mg at 02/27/15 2318 .  enoxaparin (LOVENOX) injection 30 mg, 30 mg, Subcutaneous, Q24H, Lucious Groves, DO, 30 mg at 02/28/15 1236 .  hydrochlorothiazide (MICROZIDE) capsule 12.5 mg, 12.5 mg, Oral, Daily, Oval Linsey, MD, 12.5 mg at 02/28/15 1236 .  ibuprofen (ADVIL,MOTRIN) tablet 400 mg, 400 mg, Oral, Q6H PRN, Alexa Sherral Hammers, MD .  lacosamide (VIMPAT) tablet 200 mg, 200 mg, Oral, BID, Lucious Groves, DO, 200 mg at 02/27/15 2319 .  levETIRAcetam (KEPPRA) tablet 1,500 mg, 1,500 mg, Oral, BID, Zada Finders, MD .  Linaclotide Rolan Lipa) capsule 145 mcg, 145 mcg, Oral, Daily, Lucious Groves, DO, 145 mcg at 02/28/15 1237 .  lisinopril (PRINIVIL,ZESTRIL) tablet 10 mg, 10 mg, Oral, Daily, Oval Linsey, MD, 10 mg at 02/28/15 1237 .  LORazepam (ATIVAN) injection 0.5 mg, 0.5 mg, Intravenous, Q6H PRN, Izaan Kingbird Fuller Mandril, MD .  megestrol (MEGACE) 400 MG/10ML suspension 200 mg, 200 mg, Oral, TID WC, Lucious Groves, DO, 200 mg at 02/28/15 0800 .  ondansetron (ZOFRAN) tablet 4 mg, 4 mg, Oral, Q6H PRN **OR** ondansetron (ZOFRAN) injection 4 mg, 4 mg, Intravenous, Q6H PRN, Lucious Groves, DO .  pantoprazole (PROTONIX) EC tablet 40 mg, 40 mg, Oral, Daily, Lucious Groves, DO, 40 mg at 02/28/15 1237 .  potassium chloride (K-DUR) CR tablet 20 mEq, 20 mEq,  Oral, Daily, Lucious Groves, DO, 20 mEq at 02/28/15 1238   Neurologic Examination:                                                                                                      Blood pressure 103/71, pulse 84, temperature 98.5 F (36.9 C), temperature source Oral, resp. rate 16, weight 47.1 kg (103 lb 13.4 oz), SpO2 100 %.  Evaluation of higher integrative functions including: Level of alertness: Alert,  unable to Assess orientation to time, place and person due to aphasia, The patient appeared to readily recognize physician and followed commands Speech: Spoke incomprehensible mumbling type of words, Possible motor aphasia although her baseline is not known the examiner. Chronic left MCA infarct on CT brain. Follow simple verbal commands..  Test the following cranial nerves: 2-12 grossly intact Motor examination: Spastic in the right upper and lower extremities, able to demonstrate full 5/5 motor strength in all 4 extremities Examination of sensation : unable to assesss Examination of deep tendon reflexes: 3+, on the right, 2+ on the left, normal plantars bilaterally Test coordination: Normal finger nose testing, with no evidence of limb appendicular ataxia or abnormal involuntary movements or tremors noted.  Gait: Deferred   Lab Results: Basic Metabolic Panel:  Recent Labs Lab 02/27/15 0959 02/27/15 1029  NA 140 142  K 3.9 3.9  CL 107 106  CO2 21*  --  GLUCOSE 130* 125*  BUN 6 6  CREATININE 0.81 0.60  CALCIUM 9.1  --     Liver Function Tests:  Recent Labs Lab 02/27/15 0959  AST 14*  ALT 7*  ALKPHOS 61  BILITOT 0.3  PROT 6.8  ALBUMIN 3.7    Recent Labs Lab 02/27/15 0959  LIPASE 24   No results for input(s): AMMONIA in the last 168 hours.  CBC:  Recent Labs Lab 02/27/15 0959 02/27/15 1029  WBC 8.3  --   NEUTROABS 5.6  --   HGB 11.3* 12.9  HCT 32.8* 38.0  MCV 89.1  --   PLT 343  --     Cardiac Enzymes: No results for input(s):  CKTOTAL, CKMB, CKMBINDEX, TROPONINI in the last 168 hours.  Lipid Panel: No results for input(s): CHOL, TRIG, HDL, CHOLHDL, VLDL, LDLCALC in the last 168 hours.  CBG:  Recent Labs Lab 02/27/15 0924  GLUCAP 120*    Microbiology: Results for orders placed or performed during the hospital encounter of 11/09/14  Urine culture     Status: None   Collection Time: 11/09/14 12:14 PM  Result Value Ref Range Status   Specimen Description URINE, CLEAN CATCH  Final   Special Requests NONE  Final   Culture MULTIPLE SPECIES PRESENT, SUGGEST RECOLLECTION  Final   Report Status 11/10/2014 FINAL  Final    Imaging: Ct Head Wo Contrast  02/27/2015  CLINICAL DATA:  "Pt presents via [facility] from [facility] for altered mental status, unknown LSN. EMS arrival pt is nonverbal, unable to follow commands, grimaces when palpating abdomen. Per Staff at [facility], pt is usually a x 4, walking and talking. Patient moves all extremities. Patient is nonverbal not following commands. EXAM: CT HEAD WITHOUT CONTRAST TECHNIQUE: Contiguous axial images were obtained from the base of the skull through the vertex without intravenous contrast. COMPARISON:  10/31/2014 FINDINGS: The ventricles are normal in size and configuration. There is mild ex vacuo dilation of the left lateral ventricle due to an old left frontal lobe infarct. There are no parenchymal masses or mass effect. There is no evidence of a recent cortical infarct. There are no extra-axial masses or abnormal fluid collections. There is no intracranial hemorrhage. The visualized sinuses and mastoid air cells are clear. No skull lesion. No change from the prior study. IMPRESSION: 1. No acute intracranial abnormalities. Electronically Signed   By: Lajean Manes M.D.   On: 02/27/2015 10:05   Dg Chest Port 1 View  02/27/2015  CLINICAL DATA:  Altered mental status. EXAM: PORTABLE CHEST 1 VIEW COMPARISON:  11/09/2014 FINDINGS: Cardiac silhouette is normal in  size. Aorta is uncoiled. No mediastinal or hilar masses or evidence of adenopathy. Stable atelectasis or scarring at the left lung base. No evidence of pneumonia or pulmonary edema. No pleural effusion or pneumothorax. Skeletal structures are demineralized but grossly intact. IMPRESSION: No acute cardiopulmonary disease. Electronically Signed   By: Lajean Manes M.D.   On: 02/27/2015 09:35    Assessment and plan:   Tina Sanders is an 60 y.o. 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 Post stroke eplilepsy , on 3 sz meds, incl keppra, vimpat, and dilantin as described in my consult notes.  Dilantin level was supra therapeutic at admission, was 40.7, and remained high at 36.7 this morning, and 35.3 in the evening. Patient had no sz during this hospitalization. She has occasional agitation during nursing care.  Dilantin discontinued, on keppra and  vimpat. EEG showed focal slowing over the left temporal region expected from her chronic left MCA infarct, no seizures.  Continue supportive care, and check dilantin level Q12H. When its less than 20, then can be discharged back to her nursing facility. Continue keppra 1500 mg BID , and  vimpat 200 mg BID at discharge, d/c dilantin.  Follow up with out patient neurologist.

## 2015-03-01 ENCOUNTER — Observation Stay (HOSPITAL_COMMUNITY): Payer: Medicare Other

## 2015-03-01 DIAGNOSIS — T420X1A Poisoning by hydantoin derivatives, accidental (unintentional), initial encounter: Secondary | ICD-10-CM | POA: Diagnosis not present

## 2015-03-01 DIAGNOSIS — R0781 Pleurodynia: Secondary | ICD-10-CM

## 2015-03-01 DIAGNOSIS — I10 Essential (primary) hypertension: Secondary | ICD-10-CM | POA: Diagnosis not present

## 2015-03-01 DIAGNOSIS — G40909 Epilepsy, unspecified, not intractable, without status epilepticus: Secondary | ICD-10-CM | POA: Diagnosis not present

## 2015-03-01 DIAGNOSIS — R4182 Altered mental status, unspecified: Secondary | ICD-10-CM | POA: Diagnosis not present

## 2015-03-01 DIAGNOSIS — T420X2D Poisoning by hydantoin derivatives, intentional self-harm, subsequent encounter: Secondary | ICD-10-CM

## 2015-03-01 LAB — BASIC METABOLIC PANEL
Anion gap: 10 (ref 5–15)
BUN: 9 mg/dL (ref 6–20)
CHLORIDE: 108 mmol/L (ref 101–111)
CO2: 21 mmol/L — AB (ref 22–32)
CREATININE: 0.75 mg/dL (ref 0.44–1.00)
Calcium: 9.1 mg/dL (ref 8.9–10.3)
GFR calc non Af Amer: >60 mL/min (ref >60)
Glucose, Bld: 89 mg/dL (ref 65–99)
Potassium: 3.8 mmol/L (ref 3.5–5.1)
Sodium: 139 mmol/L (ref 135–145)

## 2015-03-01 LAB — PHENYTOIN LEVEL, TOTAL: Phenytoin Lvl: 28.6 ug/mL — ABNORMAL HIGH (ref 10.0–20.0)

## 2015-03-01 MED ORDER — LIDOCAINE 5 % EX PTCH
1.0000 | MEDICATED_PATCH | CUTANEOUS | Status: DC
  Filled 2015-03-01: qty 1

## 2015-03-01 MED ORDER — PHENYTOIN 50 MG PO CHEW: 100.0000 mg | CHEWABLE_TABLET | Freq: Three times a day (TID) | ORAL | Status: DC

## 2015-03-01 MED ORDER — DICLOFENAC SODIUM 1 % TD GEL
2.0000 g | Freq: Four times a day (QID) | TRANSDERMAL | Status: DC
  Filled 2015-03-01 (×2): qty 100

## 2015-03-01 MED ORDER — LEVETIRACETAM 750 MG PO TABS: 1500.0000 mg | ORAL_TABLET | Freq: Two times a day (BID) | ORAL | Status: DC

## 2015-03-01 NOTE — Discharge Summary (Signed)
Name: Tina Sanders MRN: [id number] DOB: 1955/06/10 60 y.o. PCP: Jule Ser, DO  Date of Admission: 02/27/2015  8:57 AM Date of Discharge: 03/01/2015 Attending Physician: Oval Linsey, MD  Discharge Diagnosis:  Principal Problem:   Dilantin toxicity Active Problems:   Essential hypertension   Seizure disorder ([place])   Dilantin overdose   Rib pain on left side  Discharge Medications:   Medication List    TAKE these medications        albuterol (5 MG/ML) 0.5% nebulizer solution  Commonly known as:  PROVENTIL  Take 0.5 mLs (2.5 mg total) by nebulization every 6 (six) hours as needed for wheezing or shortness of breath.     aspirin 81 MG chewable tablet  Chew 1 tablet (81 mg total) by mouth daily.     atorvastatin 40 MG tablet  Commonly known as:  LIPITOR  Take 1 tablet (40 mg total) by mouth daily at 6 PM.     ergocalciferol 50000 units capsule  Commonly known as:  VITAMIN D2  Take 1 capsule (50,000 Units total) by mouth once a week.     lacosamide 200 MG Tabs tablet  Commonly known as:  VIMPAT  Take 1 tablet (200 mg total) by mouth 2 (two) times daily.     levETIRAcetam 750 MG tablet  Commonly known as:  KEPPRA  Take 2 tablets (1,500 mg total) by mouth 2 (two) times daily.     Linaclotide 145 MCG Caps capsule  Commonly known as:  LINZESS  Take 1 capsule (145 mcg total) by mouth daily.     lisinopril-hydrochlorothiazide 10-12.5 MG tablet  Commonly known as:  ZESTORETIC  Take 1 tablet by mouth daily.     megestrol 400 MG/10ML suspension  Commonly known as:  MEGACE  Take 5 mLs (200 mg total) by mouth 3 (three) times daily with meals.     multivitamin with minerals Tabs tablet  Take 1 tablet by mouth daily.     omeprazole 20 MG capsule  Commonly known as:  PRILOSEC  Take 1 capsule (20 mg total) by mouth daily.     phenytoin 50 MG tablet  Commonly known as:  DILANTIN  Chew 2 tablets (100 mg total) by mouth 3 (three) times daily.  Start taking on:   03/03/2015     potassium chloride 10 MEQ tablet  Commonly known as:  K-DUR  Take 2 tablets (20 mEq total) by mouth daily.        Disposition and follow-up:   Tina Sanders was discharged from [facility] in Good condition.  At the hospital follow up visit please address:  Dilantin Toxicity: Likely due to recent increased dose. Levels were trending down during admission, but still supratherapeutic on discharge. Please HOLD Dilantin and RESTART on Wednesday, February 22nd with PREVIOUS Dilantin dose of 100 mg TID chewable tabs. RECHECK Dilantin level in 5-7 days (03/06/15-03/08/15).   Seizures: PLEASE CONTINUE Dilantin 100 mg TID, Keppra 1500 mg TID and Vimpat 200 mg BID.  2.  Labs / imaging needed at time of follow-up: dilantin level in one week  3.  Pending labs/ test needing follow-up: None  Follow-up Appointments: Follow-up Information    Follow up with Loleta Chance, MD On 03/16/2015.   Specialty:  Internal Medicine   Why:  10:15 am   Contact information:   [place] [id number] [phone]       Discharge Instructions: Discharge Instructions    Diet - low sodium heart  healthy    Complete by:  As directed      Discharge instructions    Complete by:  As directed   RESTART DILANTIN 100 MG THREE TIMES A DAY RECHECK PHENYTOIN LEVEL IN 5-7 DAYS (2/25-2/27) DISCONTINUE DILANTIN 125 ORAL SOLUTION TID     Increase activity slowly    Complete by:  As directed           Consultations:  neuro  Procedures Performed:  Dg Ribs Unilateral Left  03/01/2015  CLINICAL DATA:  Patient states she fell on the 5th of this month. Cannot remember how it happened or where she hurts. Only states pain on left side. EXAM: LEFT RIBS - 2 VIEW COMPARISON:  Chest radiograph, 02/27/2015 FINDINGS: No rib fracture or rib lesion. Skeletal structures are demineralized. IMPRESSION: Negative. Electronically Signed   By: Lajean Manes M.D.   On: 03/01/2015 08:19   Ct  Head Wo Contrast  02/27/2015  CLINICAL DATA:  "Pt [facility] for altered mental status, unknown LSN. EMS arrival pt is nonverbal, unable to follow commands, grimaces when palpating abdomen. Per Staff at [facility], pt is usually a x 4, walking and talking. Patient moves all extremities. Patient is nonverbal not following commands. EXAM: CT HEAD WITHOUT CONTRAST TECHNIQUE: Contiguous axial images were obtained from the base of the skull through the vertex without intravenous contrast. COMPARISON:  10/31/2014 FINDINGS: The ventricles are normal in size and configuration. There is mild ex vacuo dilation of the left lateral ventricle due to an old left frontal lobe infarct. There are no parenchymal masses or mass effect. There is no evidence of a recent cortical infarct. There are no extra-axial masses or abnormal fluid collections. There is no intracranial hemorrhage. The visualized sinuses and mastoid air cells are clear. No skull lesion. No change from the prior study. IMPRESSION: 1. No acute intracranial abnormalities. Electronically Signed   By: Lajean Manes M.D.   On: 02/27/2015 10:05   Dg Op Swallowing Func-medicare/speech Path  02/24/2015  Objective Swallowing Evaluation: Type of Study: MBS-Modified Barium Swallow Study Patient Details Name: Tina Sanders MRN: [id number] Date of Birth: [birth year] Today's Date: 02/24/2015 Time: SLP Start Time (ACUTE ONLY): 1125-SLP Stop Time (ACUTE ONLY): 1133 SLP Time Calculation (min) (ACUTE ONLY): 8 min Past Medical History: Past Medical History Diagnosis Date . GERD (gastroesophageal reflux disease)  . Seizures ([place])  . Hypertension  . B12 deficiency  . Vitamin D deficiency disease  . Stroke [facility])    residual right sided deficits, expressive dysphasia Past Surgical History: No past surgical history on file. HPI: 60 year old femal with history of HTN, GERD, seizures and CVA with residual right sided weakness.  Patient presents for  objective assessment of swallow function.  Subjective: patient with driver from outside facility, walking but appeared impulsive during assessment Assessment / Plan / Recommendation CHL IP CLINICAL IMPRESSIONS 02/24/2015 Therapy Diagnosis Mild pharyngeal phase dysphagia Clinical Impression Patient demonstrates a mild pharyngeal dysphagia that is compounded by her cognitive deficits.  Patient impulsive with portions and pacing of self-feeding along with delayed swallow initiation to the pharynx leads to sensed aspiration of thin liquids and flash penetration of nectar-thick liquids. Cues attempted but patient unable to follow commands to increase safety with thin liquids.  Recommend SLP follow up to maximize safety with small portions and slow pace. Given that patient has sensed aspiration SLP can upgrade in patient's setting.   Impact on safety and function Moderate aspiration risk  CHL IP TREATMENT RECOMMENDATION 02/24/2015 Treatment Recommendations Defer treatment plan to f/u with SLP   Prognosis 02/24/2015 Prognosis for Safe Diet Advancement Fair Barriers to Reach Goals Cognitive deficits;Language deficits;Time post onset;Behavior Barriers/Prognosis Comment -- CHL IP DIET RECOMMENDATION 02/24/2015 SLP Diet Recommendations Dysphagia 3 (Mech soft) solids;Nectar thick liquid;Other (Comment) Liquid Administration via Cup;No straw Medication Administration Whole meds with puree Compensations Minimize environmental distractions;Slow rate;Small sips/bites Postural Changes Seated upright at 90 degrees   CHL IP OTHER RECOMMENDATIONS 02/24/2015 Recommended Consults -- Oral Care Recommendations Oral care BID;Other (Comment) Other Recommendations Prohibited food (jello, ice cream, thin soups)   CHL IP FOLLOW UP RECOMMENDATIONS 02/24/2015 Follow up Recommendations 24 hour supervision/assistance;Skilled Nursing facility   [facility] IP FREQUENCY AND DURATION 10/29/2014 Speech Therapy Frequency (ACUTE ONLY) min 2x/week Treatment Duration  --      CHL IP ORAL PHASE 02/24/2015 Oral Phase Impaired Oral - Pudding Teaspoon -- Oral - Pudding Cup -- Oral - Honey Teaspoon -- Oral - Honey Cup -- Oral - Nectar Teaspoon -- Oral - Nectar Cup -- Oral - Nectar Straw -- Oral - Thin Teaspoon -- Oral - Thin Cup -- Oral - Thin Straw -- Oral - Puree WFL Oral - Mech Soft WFL Oral - Regular Impaired mastication Oral - Multi-Consistency -- Oral - Pill -- Oral Phase - Comment good control of liquid boluses  CHL IP PHARYNGEAL PHASE 02/24/2015 Pharyngeal Phase Impaired Pharyngeal- Pudding Teaspoon -- Pharyngeal -- Pharyngeal- Pudding Cup -- Pharyngeal -- Pharyngeal- Honey Teaspoon -- Pharyngeal -- Pharyngeal- Honey Cup -- Pharyngeal -- Pharyngeal- Nectar Teaspoon -- Pharyngeal -- Pharyngeal- Nectar Cup Delayed swallow initiation-vallecula;Delayed swallow initiation-pyriform sinuses;Reduced airway/laryngeal closure;Pharyngeal residue - valleculae;Compensatory strategies attempted (with notebox);Penetration/Aspiration before swallow Pharyngeal Material enters airway, remains ABOVE vocal cords then ejected out Pharyngeal- Nectar Straw -- Pharyngeal -- Pharyngeal- Thin Teaspoon -- Pharyngeal -- Pharyngeal- Thin Cup Delayed swallow initiation-vallecula;Delayed swallow initiation-pyriform sinuses;Reduced airway/laryngeal closure;Penetration/Aspiration before swallow;Penetration/Aspiration during swallow;Moderate aspiration;Pharyngeal residue - valleculae;Compensatory strategies attempted (with notebox) Pharyngeal Material enters airway, passes BELOW cords and not ejected out despite cough attempt by patient Pharyngeal- Thin Straw -- Pharyngeal -- Pharyngeal- Puree -- Pharyngeal -- Pharyngeal- Mechanical Soft -- Pharyngeal -- Pharyngeal- Regular -- Pharyngeal -- Pharyngeal- Multi-consistency -- Pharyngeal -- Pharyngeal- Pill -- Pharyngeal -- Pharyngeal Comment cognitive-linguistic deficits appear to impact ability to follow commands and utilize recommended safe swallow strateies  consistently   CHL IP CERVICAL ESOPHAGEAL PHASE 02/24/2015 Cervical Esophageal Phase WFL Pudding Teaspoon -- Pudding Cup -- Honey Teaspoon -- Honey Cup -- Nectar Teaspoon -- Nectar Cup -- Nectar Straw -- Thin Teaspoon -- Thin Cup -- Thin Straw -- Puree -- Mechanical Soft -- Regular -- Multi-consistency -- Pill -- Cervical Esophageal Comment -- CHL IP GO 02/24/2015 Functional Assessment Tool Used MBS with skilled clinical judgement Functional Limitations Swallowing Swallow Current Status [id number]) CJ Swallow Goal Status [id number]) CJ Swallow Discharge Status [id number]) CJ Motor Speech Current Status [id number]) (None) Motor Speech Goal Status [id number]) (None) Motor Speech Goal Status [id number]) (None) Spoken Language Comprehension Current Status [id number]:[id number]) (None) Spoken Language Comprehension Goal Status [id number]) (None) Spoken Language Comprehension Discharge Status [id number]) (None) Spoken Language Expression Current Status [id number]) (None) Spoken Language Expression Goal Status [id number]) (None) Spoken Language Expression Discharge Status FB:[id number]) (None) Attention Current Status LV:[id number]) (None) Attention Goal Status FV:[id number]) (None) Attention Discharge Status [id number]) (None) Memory Current Status AE:130515) (None) Memory Goal Status GI:463060) (None) Memory Discharge Status [id number]) (None) Voice Current Status [id number]) (None) Voice Goal Status [id number]:[id number]) (None) Voice Discharge Status [id number]) (None) Other Speech-Language Pathology Functional  Limitation [phone]) (None) Other Speech-Language Pathology Functional Limitation Goal Status [phone]) (None) Other Speech-Language Pathology Functional Limitation Discharge Status [phone]) (None) Carmelia Roller., CCC-SLP [phone] [place] 02/24/2015, 3:16 PM            CLINICAL DATA:  Confusion, aspiration pneumonia. EXAM: MODIFIED BARIUM SWALLOW TECHNIQUE: Different consistencies of barium were administered orally to the patient by the Speech Pathologist. Imaging of the pharynx was performed in the lateral projection.  FLUOROSCOPY TIME:  Radiation Exposure Index (as provided by the fluoroscopic device): If the device does not provide the exposure index: Fluoroscopy Time:  1 minutes 43 seconds. Number of Acquired Images:  None. COMPARISON:  None. FINDINGS: Thin liquid- flash penetration with silent aspiration. Nectar thick liquid- flash penetration with minimal retention. No aspiration. Honey- not assessed. Pure- within normal limits Cracker-within normal limits Pure with cracker- within normal limits Barium tablet -  not assessed. IMPRESSION: Silent aspiration with thin liquids. Nectar thick liquids are recommended. Please refer to the Speech Pathologists report for complete details and recommendations. Electronically Signed   By: Lorin Picket M.D.   On: 02/24/2015 13:40   Dg Chest Port 1 View  02/27/2015  CLINICAL DATA:  Altered mental status. EXAM: PORTABLE CHEST 1 VIEW COMPARISON:  11/09/2014 FINDINGS: Cardiac silhouette is normal in size. Aorta is uncoiled. No mediastinal or hilar masses or evidence of adenopathy. Stable atelectasis or scarring at the left lung base. No evidence of pneumonia or pulmonary edema. No pleural effusion or pneumothorax. Skeletal structures are demineralized but grossly intact. IMPRESSION: No acute cardiopulmonary disease. Electronically Signed   By: Lajean Manes M.D.   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 This occurred around 9:30 AM as she was noted to have fluctuating levels of alertness. There were no associated convulsions. Family members who were present for interview with the ED provider state they last saw her 2 days ago at baseline status and she currently has worse slurring speech and lethargy. She moved to ALF several weeks ago where she has been receiving her  Keppra, Vimpat, and Dilantin that she is on since her hospitalization in October 2016 for seizures that occurred after several weeks of noncompliance. On review of ALF MAR appears Dilantin was recently increased to 125 mg 3 times daily from 100 mg 3 times daily. After arrival at the ED initial labs showed a Dilantin level of 37.5 (therapeutic range 10-20) at 0959 and 40.7 on repeat at 1641. CT head was obtained demonstrating chronic left MCA infarct. Chest x-ray, UDS, and other labs including lactic acid grossly unremarkable.  *Patient was alert but oriented only to person with significant dysarthria during interview so history limited to other providers and record review.  Hospital Course by problem list: Principal Problem:   Dilantin toxicity Active Problems:   Essential hypertension   Seizure disorder (H[facility])   Dilantin overdose   Rib pain on left side   Dilantin toxicity: Per [facility]F [facility], patient's Dilantin (Phenytoin) was increased from 100 mg chewable tablets TID to 125 mg/56mL suspension every 8 hours by [facility] physician. Nurse states that patient had no issues with chewing her tablets. This change was made on 02/23/15 for unknown reasons. She was also on Keppra 1500 mg BID which was reduced to 750 mg BID at ALF. Per ALF nurse, her  medication bottle is written for 2 tablets BID, but their MAR shows 1 tablet BID. Patient's Dilantin level was supratherapeutic on discharge at >40 which trended down to 28.6 on day of discharge. Patient was back to her baseline without signs of toxicity. Please HOLD Dilantin and RESTART on Wednesday, February 22nd with PREVIOUS Dilantin dose of 100 mg TID chewable tabs. RECHECK Dilantin level in 5-7 days (03/06/15-03/08/15).   Seizure Disorder: Patient was discharged on Dilantin 100 mg TID, Keppra 1500 mg TID and Vimpat 200 mg BID. EEG was not read after being performed in the ED. However, patient had no evidence of seizure like activity. PLEASE CONTINUE  Dilantin 100 mg TID, Keppra 1500 mg TID and Vimpat 200 mg BID.  Left sided rib pain: Patient with left sided chest wall pain that occurred after a fall. On exam there is tenderness to palpation and erythema at the mid-lateral chest wall and back. Patient did have reported fall and there is concern for rib injury; however, Xray left ribs negative for fracture. We continued supportive care.   Discharge Vitals:   BP 113/78 mmHg  Pulse 102  Temp(Src) 99.1 F (37.3 C) (Oral)  Resp 19  Wt 103 lb 13.4 oz (47.1 kg)  SpO2 99%  Discharge Labs:  Results for orders placed or performed during the hospital encounter of 02/27/15 (from the past 24 hour(s))  Phenytoin level, total     Status: Abnormal   Collection Time: 02/28/15  5:58 PM  Result Value Ref Range   Phenytoin Lvl 35.3 (HH) 10.0 - 20.0 ug/mL  Basic metabolic panel     Status: Abnormal   Collection Time: 03/01/15  5:17 AM  Result Value Ref Range   Sodium 139 135 - 145 mmol/L   Potassium 3.8 3.5 - 5.1 mmol/L   Chloride 108 101 - 111 mmol/L   CO2 21 (L) 22 - 32 mmol/L   Glucose, Bld 89 65 - 99 mg/dL   BUN 9 6 - 20 mg/dL   Creatinine, Ser 0.75 0.44 - 1.00 mg/dL   Calcium 9.1 8.9 - 10.3 mg/dL   GFR calc non Af Amer >60 >60 mL/min   GFR calc Af Amer >60 >60 mL/min   Anion gap 10 5 - 15  Phenytoin level, total     Status: Abnormal   Collection Time: 03/01/15  5:17 AM  Result Value Ref Range   Phenytoin Lvl 28.6 (H) 10.0 - 20.0 ug/mL    Signed: Osa Craver, DO PGY-2 Internal Medicine Resident Pager # [phone] 03/01/2015 1:48 PM

## 2015-03-01 NOTE — Discharge Instructions (Signed)
Dilantin Toxicity: Likely due to recent increased dose. Levels were trending down during admission, but still supratherapeutic on discharge. Please HOLD Dilantin and RESTART on Wednesday, February 22nd with PREVIOUS Dilantin dose of 100 mg TID chewable tabs. RECHECK Dilantin level in 5-7 days (03/06/15-03/08/15).   Seizures: PLEASE CONTINUE Dilantin 100 mg TID, Keppra 1500 mg TID and Vimpat 200 mg BID.

## 2015-03-01 NOTE — Progress Notes (Addendum)
Called by primary team to discuss final doses of anticonvulsants at discharge. Overall clinical presentation and labs most consistent with Dilantin toxicity due to recently increased dose.   The patient has been discharged with instructions to take lacosamide 200 mg BID and Keppra 1500 mg BID. Dilantin still supratherapeutic, therefore decision was made by primary team to hold Dilantin today and tomorrow, then resume at 100 mg TID. The discharge instructions, including continuing to hold Dilantin until Wednesday would be consistent with the standard of care. Dilantin to be restarted back at initial total daily dose that patient was taking prior to the recent increase, which most likely resulted in her toxic level.   Recommendations:  1. Prior to restarting Dilantin on Wednesday, would have a level drawn to ensure that it is less than 20.  2. Please call the Neurology day team if there are any additional questions regarding the outpatient management plan for her anticonvulsant regimen.  3. EEG result is pending. We will obtain the result in the AM and add a note discussing the result and possible additional recommendations.  4. We appreciate the opportunity to participate in the care of this patient.   Kerney Elbe, MD

## 2015-03-01 NOTE — Clinical Social Work Note (Addendum)
Patient is from [facility] [place], [phone], [facility] contacted ALF and they said they can take patient back today.  CSW contacted patient's sister Chaslyn Ulatowski at [phone] and informed her that patient will be discharging back to ALF via [facility].  Patient and family agreeable to discharge, CSW to sign off, please reconsult if other social work needs arise.  Jones Broom. [place], MSW, [place] 03/01/2015 4:30 pm

## 2015-03-01 NOTE — Progress Notes (Addendum)
Internal Medicine Attending  Date: 03/01/2015  Patient name: Tina Sanders Medical record number: [id number] Date of birth: 04-15-1955 Age: 60 y.o. Gender: female  I saw and evaluated the patient. I reviewed the resident's note by Dr. Posey Pronto and I agree with the resident's findings and plans as documented in his progress note.  Tina Sanders was much more talkative on rounds this morning. Her Dilantin level is now down to 28. We continue to wait for the EEG results as well as the consultation notes from this weekend by neurology. We appreciate their advice on an appropriate antiepileptic regimen. I have a call out to the on-call attending as this note is being dictated to sort out this issue. She is being transferred back to her assisted living facility for continuing long-term care.

## 2015-03-01 NOTE — Care Management Obs Status (Signed)
[place] NOTIFICATION   Patient Details  Name: Tina Sanders MRN: [id number] Date of Birth: 1955-06-17   Medicare Observation Status Notification Given:  Yes    Nila Nephew, RN 03/01/2015, 11:52 AM

## 2015-03-01 NOTE — Progress Notes (Signed)
Attempted to contact resident with no call back. At this time will have pharmacy to dose Dilantin. All questions regarding dilantin should be through pharmacy. Recommend Continue Keppra and Vimpat at current dose. Neurology will S/O  Etta Quill PA-C [facility] [phone]  03/01/2015, 1:07 PM

## 2015-03-01 NOTE — Progress Notes (Signed)
   Subjective: Patient awake and alert. She is speaking in more complete sentences and following commands. She continues to complain of left sided chest wall pain.  Objective: Vital signs in last 24 hours: Filed Vitals:   02/27/15 2205 02/28/15 0513 02/28/15 2109 03/01/15 0430  BP: 118/72 103/71 106/61 126/74  Pulse: 72 84 90 85  Temp: 97.7 F (36.5 C) 98.5 F (36.9 C) 97.8 F (36.6 C) 98.8 F (37.1 C)  TempSrc: Oral Oral Oral Oral  Resp: 16 16 16 16   Weight: 103 lb 13.4 oz (47.1 kg)     SpO2: 100% 100% 97% 100%   Weight change:  No intake or output data in the 24 hours ending 03/01/15 1157 General: resting in bed, pleasant HEENT: EOMI intact, no nystagmus Cardiac: RRR, no rubs, murmurs or gallops Chest: tenderness left chest wall and back with erythema Pulm: clear to auscultation bilaterally, moving normal volumes of air Abd: soft, nontender Neuro: alert, dysarthric, forming more complete sentences and following commands more appropriately   Assessment/Plan: Principal Problem:   Dilantin toxicity Active Problems:   Essential hypertension   Seizure disorder (HCC)   Dilantin overdose   Rib pain on left side  60 year old woman with past medical history of left MCA stroke 2015 w/ R residual deficits, seizure disorder, hypertension presents from [facility]F after reportedly passing out in dining area. Dilantin level was elevated at 37.5 (therapeutic range 10-20) at 0959 and 40.7 on repeat at 1641.  Dilantin toxicity: Per ALF [facility], patient's Dilantin (Phenytoin) was increased from 100 mg chewable tablets TID to 125 mg/44mL suspension every 8 hours by [facility] physician. Nurse states that patient had no issues with chewing her tablets. This change was made on 02/23/15 for unknown reasons. She was also on Keppra 1500 mg BID which was reduced to 750 mg BID at ALF. Per ALF nurse, her medication bottle is written for 2 tablets BID, but their MAR shows 1 tablet BID.  Patient clinically improving, stable for discharge back to [facility] today.  -Holding Dilantin -> Would like Neurology's input on re-initiating Dilantin and any recommendations for anti-seizure regimen upon discharge -Dilantin level this morning 28.6 -Continue supportive care   Seizure disorder: As above, EEG performed for suspicion of seizure activity. Will continue to hold Dilantin and continue Keppra and Vimpat per Neuro recommendations. -Continue home Vimpat 200 mg BID -Keppra 1500 mg BID -f/u EEG  Left sided rib pain: Patient with left sided chest wall pain that occurred after a fall. On exam there is tenderness to palpation and erythema at the mid-lateral chest wall and back. Patient did have reported fall and there is concern for rib injury. -Xray left ribs negative for fracture -Ibuprofen 400 mg q6h prn   Dispo: Patient stable for discharge back to [facility] pending EEG read and Neurology recommendations for re-initiation of Dilantin and anti-seizure regimen.  The patient does have a current PCP Jule Ser, DO) and does need an [facility] hospital follow-up appointment after discharge.      Zada Finders, MD 03/01/2015, 11:57 AM

## 2015-03-01 NOTE — NC FL2 (Signed)
[place] LEVEL OF CARE SCREENING TOOL     IDENTIFICATION  Patient Name: Tina Sanders Birthdate: 22-Feb-1955 Sex: female Admission Date (Current Location): 02/27/2015  [facility] and [place] Number:  Herbalist and Address:  [facility] [place], [place] [address] [place], [place] [address]      Provider Number: [id number]  Attending Physician Name and Address:  Oval Linsey, MD  Relative Name and Phone Number:  Aniesa, Buday [phone]    Current Level of Care: Hospital Recommended Level of Care: [place] Prior Approval Number:    Date Approved/Denied:   PASRR Number:    Discharge Plan: Other (Comment) [facility])    Current Diagnoses: Patient Active Problem List   Diagnosis Date Noted  . Dilantin overdose   . Rib pain on left side   . Dilantin toxicity 02/27/2015  . Seizure disorder ([place]) 02/27/2015  . 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 Focal and partial seizures ([place]) 10/31/2014  . Sickle cell disease ([place]) 10/30/2014  . Seizure ([place])   . Aphasia   . Hepatitis C antibody test positive 10/27/2014  . Malnutrition of moderate degree 10/27/2014  . Hyperlipidemia 10/15/2014  . Essential hypertension 10/14/2014  . GERD (gastroesophageal reflux disease) 10/14/2014  . Generalized seizures ([place]) 10/14/2014  . Vitamin D deficiency 10/14/2014  . Vitamin B12 deficiency 10/14/2014  . Asthma 10/14/2014  . CVA (cerebral vascular accident) ([place]) 10/14/2014    Orientation RESPIRATION BLADDER Height & Weight     Self, Place  Normal Continent Weight: 103 lb 13.4 oz (47.1 kg) Height:     BEHAVIORAL SYMPTOMS/MOOD NEUROLOGICAL BOWEL NUTRITION STATUS    Convulsions/Seizures Continent    AMBULATORY STATUS COMMUNICATION OF NEEDS Skin     Verbally Normal                       Personal Care Assistance Level of Assistance              Functional Limitations Info  Hearing, Speech    Hearing Info: Adequate Speech Info: Adequate    SPECIAL CARE FACTORS FREQUENCY                       Contractures      Additional Factors Info  Code Status Code Status Info: Full Code             Current Medications (03/01/2015):  This is the current hospital active medication list Current Facility-Administered Medications  Medication Dose Route Frequency Provider Last Rate Last Dose  . aspirin chewable tablet 81 mg  81 mg Oral Daily Lucious Groves, DO   81 mg at 03/01/15 [id number]  . atorvastatin (LIPITOR) tablet 40 mg  40 mg Oral QHS Lucious Groves, DO   40 mg at 02/28/15 2126  . diclofenac sodium (VOLTAREN) 1 % transdermal gel 2 g  2 g Topical QID Iline Oven, MD      . enoxaparin (LOVENOX) injection 30 mg  30 mg Subcutaneous Q24H Lucious Groves, DO   30 mg at 03/01/15 0854  . hydrochlorothiazide (MICROZIDE) capsule 12.5 mg  12.5 mg Oral Daily Oval Linsey, MD   12.5 mg at 03/01/15 [id number]  . ibuprofen (ADVIL,MOTRIN) tablet 400 mg  400 mg Oral Q6H PRN Alexa Sherral Hammers, MD      . lacosamide (VIMPAT) tablet 200 mg  200 mg Oral BID Lucious Groves, DO  200 mg at 03/01/15 0852  . levETIRAcetam (KEPPRA) tablet 1,500 mg  1,500 mg Oral BID Zada Finders, MD   1,500 mg at 03/01/15 0853  . lidocaine (LIDODERM) 5 % 1 patch  1 patch Transdermal Q24H Iline Oven, MD      . Linaclotide [facility]) capsule 145 mcg  145 mcg Oral Daily Lucious Groves, DO   145 mcg at 03/01/15 [id number]  . lisinopril (PRINIVIL,ZESTRIL) tablet 10 mg  10 mg Oral Daily Oval Linsey, MD   10 mg at 03/01/15 0853  . LORazepam (ATIVAN) injection 0.5 mg  0.5 mg Intravenous Q6H PRN Ram Fuller Mandril, MD      . megestrol (MEGACE) 400 MG/10ML suspension 200 mg  200 mg Oral TID WC Lucious Groves, DO   200 mg at 03/01/15 1256  . ondansetron (ZOFRAN) tablet 4 mg  4 mg Oral Q6H PRN Lucious Groves, DO       Or  . ondansetron [facility]) injection 4 mg  4 mg Intravenous Q6H PRN Lucious Groves, DO      .  pantoprazole (PROTONIX) EC tablet 40 mg  40 mg Oral Daily Lucious Groves, DO   40 mg at 03/01/15 0853  . potassium chloride (K-DUR) CR tablet 20 mEq  20 mEq Oral Daily Lucious Groves, DO   20 mEq at 03/01/15 [id number]     Discharge Medications: Please see discharge summary for a list of discharge medications.  Relevant Imaging Results:  Relevant Lab Results:   Additional Information    Jewett Mcgann, Jones Broom, LCSWA

## 2015-03-01 NOTE — Clinical Social Work Note (Signed)
Clinical Social Work Assessment  Patient Details  Name: Tina Sanders MRN: [id number] Date of Birth: December 06, 1955  Date of referral:  03/01/15               Reason for consult:  Facility Placement                Permission sought to share information with:  Family Supports, Customer service manager Permission granted to share information::  Yes, Verbal Permission Granted  Name::     [name], [name] [phone]  Agency::  [facility]  Relationship::     Contact Information:     Housing/Transportation Living arrangements for the past 2 months:  [place] of Information:  Patient, Other (Comment Required) ([name] Tina Sanders) Patient Interpreter Needed:  None Criminal Activity/Legal Involvement Pertinent to Current Situation/Hospitalization:  No - Comment as needed Significant Relationships:  Other Family Members Lives with:  Facility Resident Do you feel safe going back to the place where you live?  Yes Need for family participation in patient care:  Yes (Comment) (Patient has history of stroke and is difficult to understand)  Care giving concerns:  Patient is from [place], patient and family did not express any concerns.   Social Worker assessment / plan:  Patient is a 60 year old female who is from [facility], patient is difficult to understand, but is alert and oriented x3.  Patient is from ALF and plans to return back to facility.  CSW spoke with patient's sister Tina Sanders who was going to pick patient up to bring her back to ALF, but she was able to transport today.  [name] and ALF requested patient be transferred via [facility].  CSW made arrangements for patient to transfer back to ALF.    Employment status:  Retired Forensic scientist:  Information systems manager, Medicaid In [place] PT Recommendations:  Not assessed at this time Information / Referral to community resources:     Patient/Family's Response to care:  Patient  agreeable to returning back to ALF.  Patient/Family's Understanding of and Emotional Response to Diagnosis, Current Treatment, and Prognosis:  Patient and family aware of diagnosis and treatment plan. Emotional Assessment Appearance:  Appears older than stated age Attitude/Demeanor/Rapport:    Affect (typically observed):  Appropriate, Irritable, Stable Orientation:  Oriented to Self, Oriented to Place, Oriented to  Time Alcohol / Substance use:  Not Applicable Psych involvement (Current and /or in the community):  No (Comment)  Discharge Needs  Concerns to be addressed:  No discharge needs identified Readmission within the last 30 days:  No Current discharge risk:  None Barriers to Discharge:  No Barriers Identified   Anell Barr 03/01/2015, 6:56 PM

## 2015-03-02 ENCOUNTER — Emergency Department (HOSPITAL_COMMUNITY)
Admission: EM | Admit: 2015-03-02 | Discharge: 2015-03-02 | Disposition: A | Discharge status: Home / Self Care | Payer: Medicare Other | Attending: Emergency Medicine | Admitting: Emergency Medicine

## 2015-03-02 ENCOUNTER — Encounter (HOSPITAL_COMMUNITY): Payer: Self-pay

## 2015-03-02 DIAGNOSIS — E559 Vitamin D deficiency, unspecified: Secondary | ICD-10-CM | POA: Diagnosis not present

## 2015-03-02 DIAGNOSIS — Z8673 Personal history of transient ischemic attack (TIA), and cerebral infarction without residual deficits: Secondary | ICD-10-CM | POA: Insufficient documentation

## 2015-03-02 DIAGNOSIS — Z880 Allergy status to penicillin: Secondary | ICD-10-CM | POA: Diagnosis not present

## 2015-03-02 DIAGNOSIS — K219 Gastro-esophageal reflux disease without esophagitis: Secondary | ICD-10-CM | POA: Insufficient documentation

## 2015-03-02 DIAGNOSIS — R262 Difficulty in walking, not elsewhere classified: Secondary | ICD-10-CM | POA: Diagnosis not present

## 2015-03-02 DIAGNOSIS — F17210 Nicotine dependence, cigarettes, uncomplicated: Secondary | ICD-10-CM | POA: Diagnosis not present

## 2015-03-02 DIAGNOSIS — I10 Essential (primary) hypertension: Secondary | ICD-10-CM | POA: Insufficient documentation

## 2015-03-02 DIAGNOSIS — Z7982 Long term (current) use of aspirin: Secondary | ICD-10-CM | POA: Diagnosis not present

## 2015-03-02 DIAGNOSIS — Z79899 Other long term (current) drug therapy: Secondary | ICD-10-CM | POA: Diagnosis not present

## 2015-03-02 DIAGNOSIS — R4182 Altered mental status, unspecified: Secondary | ICD-10-CM | POA: Insufficient documentation

## 2015-03-02 DIAGNOSIS — R269 Unspecified abnormalities of gait and mobility: Secondary | ICD-10-CM

## 2015-03-02 DIAGNOSIS — R531 Weakness: Secondary | ICD-10-CM | POA: Diagnosis present

## 2015-03-02 LAB — CBC WITH DIFFERENTIAL/PLATELET
BASOS PCT: 0 %
Basophils Absolute: 0 10*3/uL (ref 0.0–0.1)
EOS ABS: 0.3 10*3/uL (ref 0.0–0.7)
Eosinophils Relative: 4 %
HEMATOCRIT: 33.9 % — AB (ref 36.0–46.0)
HEMOGLOBIN: 11.4 g/dL — AB (ref 12.0–15.0)
LYMPHS ABS: 2.1 10*3/uL (ref 0.7–4.0)
Lymphocytes Relative: 36 %
MCH: 29.2 pg (ref 26.0–34.0)
MCHC: 33.6 g/dL (ref 30.0–36.0)
MCV: 86.9 fL (ref 78.0–100.0)
MONOS PCT: 11 %
Monocytes Absolute: 0.7 10*3/uL (ref 0.1–1.0)
NEUTROS ABS: 2.9 10*3/uL (ref 1.7–7.7)
NEUTROS PCT: 49 %
Platelets: 315 10*3/uL (ref 150–400)
RBC: 3.9 MIL/uL (ref 3.87–5.11)
RDW: 12.7 % (ref 11.5–15.5)
WBC: 6 10*3/uL (ref 4.0–10.5)

## 2015-03-02 LAB — BASIC METABOLIC PANEL
Anion gap: 10 (ref 5–15)
BUN: 19 mg/dL (ref 6–20)
CHLORIDE: 107 mmol/L (ref 101–111)
CO2: 22 mmol/L (ref 22–32)
Calcium: 9.3 mg/dL (ref 8.9–10.3)
Creatinine, Ser: 1.02 mg/dL — ABNORMAL HIGH (ref 0.44–1.00)
GFR calc non Af Amer: 58 mL/min — ABNORMAL LOW (ref >60)
Glucose, Bld: 77 mg/dL (ref 65–99)
POTASSIUM: 4.4 mmol/L (ref 3.5–5.1)
SODIUM: 139 mmol/L (ref 135–145)

## 2015-03-02 LAB — PHENYTOIN LEVEL, TOTAL: PHENYTOIN LVL: 23.3 ug/mL — AB (ref 10.0–20.0)

## 2015-03-02 MED ORDER — PHENYTOIN 50 MG PO CHEW: 100.0000 mg | CHEWABLE_TABLET | Freq: Three times a day (TID) | ORAL | Status: DC

## 2015-03-02 NOTE — ED Provider Notes (Signed)
CSN: [id number]     Arrival date & time 03/02/15  1330 History   First MD Initiated Contact with Patient 03/02/15 1340     Chief Complaint  Patient presents with  . Neurologic Problem     (Consider location/radiation/quality/duration/timing/severity/associated sxs/prior Treatment) Patient is a 60 y.o. female presenting with neurologic complaint. The history is provided by the nursing home.  Neurologic Problem This is a recurrent problem. The current episode started 1 to 2 hours ago. The problem occurs rarely. The problem has been resolved. Associated symptoms comments: Difficulty walking to lunch, generalized weakness. Nothing aggravates the symptoms. Nothing relieves the symptoms. She has tried nothing for the symptoms.    Past Medical History  Diagnosis Date  . GERD (gastroesophageal reflux disease)   . Seizures ([place])   . Hypertension   . B12 deficiency   . Vitamin D deficiency disease   . Stroke [facility])     residual right sided deficits, expressive dysphasia   History reviewed. No pertinent past surgical history. Family History  Problem Relation Age of Onset  . Hypertension Mother   . Hypertension Father   . Kidney disease Father   . Hyperlipidemia Father   . Hypertension Sister   . Diabetes Sister    Social History  Substance Use Topics  . Smoking status: Current Every Day Smoker -- 0.25 packs/day for 40 years    Types: Cigarettes  . Smokeless tobacco: None  . Alcohol Use: 8.4 oz/week    0 Standard drinks or equivalent, 14 Cans of beer per week     Comment: Sometimes.   OB History    No data available     Review of Systems  All other systems reviewed and are negative.     Allergies  Penicillins  Home Medications   Prior to Admission medications   Medication Sig Start Date End Date Taking? Authorizing Provider  albuterol (PROVENTIL) (5 MG/ML) 0.5% nebulizer solution Take 0.5 mLs (2.5 mg total) by nebulization every 6 (six) hours as needed for wheezing or  shortness of breath. Patient not taking: Reported on 02/27/2015 10/14/14   Jule Ser, DO  aspirin 81 MG chewable tablet Chew 1 tablet (81 mg total) by mouth daily. 12/22/14   Jule Ser, DO  atorvastatin (LIPITOR) 40 MG tablet Take 1 tablet (40 mg total) by mouth daily at 6 PM. Patient taking differently: Take 40 mg by mouth at bedtime.  12/22/14   Jule Ser, DO  ergocalciferol (VITAMIN D2) 50000 UNITS capsule Take 1 capsule (50,000 Units total) by mouth once a week. Patient taking differently: Take 50,000 Units by mouth every Saturday.  12/22/14   Jule Ser, DO  lacosamide (VIMPAT) 200 MG TABS tablet Take 1 tablet (200 mg total) by mouth 2 (two) times daily. 12/22/14   Jule Ser, DO  levETIRAcetam (KEPPRA) 750 MG tablet Take 2 tablets (1,500 mg total) by mouth 2 (two) times daily. 03/01/15   Alexa Sherral Hammers, MD  Linaclotide (LINZESS) 145 MCG CAPS capsule Take 1 capsule (145 mcg total) by mouth daily. 12/22/14   Jule Ser, DO  lisinopril-hydrochlorothiazide (ZESTORETIC) 10-12.5 MG tablet Take 1 tablet by mouth daily. 12/22/14 12/21/15  Jule Ser, DO  megestrol (MEGACE) 400 MG/10ML suspension Take 5 mLs (200 mg total) by mouth 3 (three) times daily with meals. 12/22/14   Jule Ser, DO  Multiple Vitamin (MULTIVITAMIN WITH MINERALS) TABS tablet Take 1 tablet by mouth daily. Patient not taking: Reported on 02/27/2015 10/31/14   Iline Oven, MD  omeprazole (  PRILOSEC) 20 MG capsule Take 1 capsule (20 mg total) by mouth daily. 12/22/14 12/21/15  Jule Ser, DO  phenytoin (DILANTIN) 50 MG tablet Chew 2 tablets (100 mg total) by mouth 3 (three) times daily. 03/03/15   Alexa Sherral Hammers, MD  potassium chloride (K-DUR) 10 MEQ tablet Take 2 tablets (20 mEq total) by mouth daily. 12/22/14   Jule Ser, DO   BP 102/68 mmHg  Pulse 89  Temp(Src) 98.5 F (36.9 C) (Oral)  Resp 16  SpO2 100% Physical Exam  Constitutional: She appears well-developed and  well-nourished. No distress.  HENT:  Head: Normocephalic.  Eyes: Conjunctivae are normal.  Neck: Neck supple. No tracheal deviation present.  Cardiovascular: Normal rate and regular rhythm.   Pulmonary/Chest: Effort normal. No respiratory distress.  Abdominal: Soft. She exhibits no distension.  Neurological: She is alert. She is not disoriented. Gait (patient walking unassisted) normal.  abnormal speech is at baseline per family, more awake and alert than previous evaluation  Skin: Skin is warm and dry.  Psychiatric: She has a normal mood and affect.    ED Course  Procedures (including critical care time) Labs Review Labs Reviewed  CBC WITH DIFFERENTIAL/PLATELET - Abnormal; Notable for the following:    Hemoglobin 11.4 (*)    HCT 33.9 (*)    All other components within normal limits  BASIC METABOLIC PANEL - Abnormal; Notable for the following:    Creatinine, Ser 1.02 (*)    GFR calc non Af Amer 58 (*)    All other components within normal limits  PHENYTOIN LEVEL, TOTAL - Abnormal; Notable for the following:    Phenytoin Lvl 23.3 (*)    All other components within normal limits    Imaging Review Dg Ribs Unilateral Left  03/01/2015  CLINICAL DATA:  Patient states she fell on the 5th of this month. Cannot remember how it happened or where she hurts. Only states pain on left side. EXAM: LEFT RIBS - 2 VIEW COMPARISON:  Chest radiograph, 02/27/2015 FINDINGS: No rib fracture or rib lesion. Skeletal structures are demineralized. IMPRESSION: Negative. Electronically Signed   By: Lajean Manes M.D.   On: 03/01/2015 08:19   I have personally reviewed and evaluated these images and lab results as part of my medical decision-making.   EKG Interpretation None      MDM   Final diagnoses:  Gait difficulty   2:03 PM Per nursing facility: almost fell three times, normally ambulatory without assistance. When staff sat her down in bed she appeared sleepy. Was normal this morning. "She was  not herself, but I'm not sure what's going on with her".  Pt is back to baseline. Ambulated to bathroom without assistance. Dilantin level is still high similar to time of dc despite asking facility to hold doses suggesting possible continued administration. Not as dramatically elevated as previous admission levels. Patient may be slightly deconditioned from hospitalization. Discussed readmission for PT/OT vs home with family and they opted to have the patient return to facility. Stressed holding doses of dilantin in discharge information for another 2 days until she is able to fully metabolize. Return precautions discussed.    Leo Grosser, MD 03/02/15 2211

## 2015-03-02 NOTE — ED Notes (Signed)
To room via EMS.  Pt from [facility] reports that pt was LSN last night at bedtime, pt woke up this morning and staff could not understand what pt was saying.  Pt had previous stroke that left slurred speech, nsg home reports that if pt speaks slowly she can be understood.  EMS saw pt ambulate without difficulty.  Unable to follow commands with EMS.

## 2015-03-02 NOTE — Progress Notes (Signed)
EEG results and Neurology recommendations faxed to patient's ALF on 03/02/15.  Osa Craver, DO PGY-2 Internal Medicine Resident Pager # [phone] 03/02/2015 4:31 PM

## 2015-03-02 NOTE — ED Notes (Signed)
Called report to [facility] [phone], Solmon Ice.  Called [facility],

## 2015-03-02 NOTE — Procedures (Signed)
ELECTROENCEPHALOGRAM REPORT  Date of Study: 02/27/2015 (This is a late entry EEG. Study assigned to this provider on 03/02/15).  Patient's Name: Tina Sanders MRN: [id number] Date of Birth: 03/06/1955  Referring Provider: Dr. Fenton Malling  Clinical History: This is a 60 year old woman with a history of seizures admitted for fluctuating level of awareness.  Medications: Keppra, Dilantin, Vimpat  Technical Summary: A multichannel digital EEG recording measured by the international 10-20 system with electrodes applied with paste and impedances below 5000 ohms performed in our laboratory with EKG monitoring in an awake and confused patient.  Hyperventilation and photic stimulation were not performed.  The digital EEG was referentially recorded, reformatted, and digitally filtered in a variety of bipolar and referential montages for optimal display.    Description: The patient is awake and confused during the recording.  During maximal wakefulness, there is an asymmetric, medium voltage 8 Hz posterior dominant rhythm better formed over the right occipital region. There is occasional focal theta and delta slowing over the left temporal region. Deeper stages of sleep were not seen. Hyperventilation and photic stimulation were not performed.  There were no epileptiform discharges or electrographic seizures seen.    EKG lead was unremarkable.  Impression: This awake EEG is abnormal due to focal slowing over the left temporal region.  Clinical Correlation of the above findings indicates focal cerebral dysfunction over the left temporal region suggestive of underlying structural or physiologic abnormality. There were no electrographic seizures seen in this study. The absence of epileptiform discharges does not exclude a clinical diagnosis of epilepsy. Clinical correlation is advised.    Ellouise Newer, M.D.

## 2015-03-02 NOTE — Discharge Instructions (Signed)

## 2015-03-02 NOTE — ED Notes (Signed)
Called [facility] for pickup and transportation to [facility].

## 2015-03-02 NOTE — ED Notes (Signed)
Pt ambulated to bathroom with stand by assist.  Sister is at side and states pt is walking with a limp and is unsteady.

## 2015-03-02 NOTE — Progress Notes (Signed)
EEG report completed:   The patient was awake and confused during the recording. During maximal wakefulness, there is an asymmetric, medium voltage 8 Hz posterior dominant rhythm better formed over the right occipital region. There is occasional focal theta and delta slowing over the left temporal region. Deeper stages of sleep were not seen. Hyperventilation and photic stimulation were not performed. There were no epileptiform discharges or electrographic seizures seen.   The awake EEG was abnormal due to focal slowing over the left temporal region.  Findings were most consistent with focal cerebral dysfunction over the left temporal region suggestive of underlying structural or physiologic abnormality. There were no electrographic seizures seen in the study.   Assessment: In the context of the initial presentation with Dilantin toxicity and history of seizures, the focal slowing seen on EEG is most likely secondary to either postictal focal slowing in the left temporal region.secondary to subclinical seizure (Dilantin toxicity can paradoxically lead to seizures) versus focal slowing secondary to prior known left MCA stroke.   Kerney Elbe, MD

## 2015-03-16 ENCOUNTER — Ambulatory Visit: Payer: Medicare Other | Admitting: Internal Medicine

## 2015-04-21 ENCOUNTER — Other Ambulatory Visit: Payer: Self-pay | Admitting: Internal Medicine

## 2015-04-21 NOTE — Telephone Encounter (Signed)
[place] pt needs appt, cancelled last appt

## 2015-04-27 NOTE — Telephone Encounter (Signed)
Spoke with patient's sister, Tina Sanders, this morning.  States Tina Sanders is a permanent resident at [facility] on [address] in [place].  There she sees their doctors and they handle all of meds.  Tina Sanders has been removed as pcp.

## 2015-05-04 ENCOUNTER — Other Ambulatory Visit: Payer: Self-pay | Admitting: Internal Medicine

## 2015-05-13 ENCOUNTER — Other Ambulatory Visit: Payer: Self-pay | Admitting: Internal Medicine

## 2015-05-27 ENCOUNTER — Emergency Department (HOSPITAL_COMMUNITY)
Admission: EM | Admit: 2015-05-27 | Discharge: 2015-05-27 | Disposition: A | Discharge status: Home / Self Care | Payer: Medicare Other | Attending: Emergency Medicine | Admitting: Emergency Medicine

## 2015-05-27 ENCOUNTER — Encounter (HOSPITAL_COMMUNITY): Payer: Self-pay | Admitting: Emergency Medicine

## 2015-05-27 ENCOUNTER — Emergency Department (HOSPITAL_COMMUNITY): Payer: Medicare Other

## 2015-05-27 DIAGNOSIS — Z8673 Personal history of transient ischemic attack (TIA), and cerebral infarction without residual deficits: Secondary | ICD-10-CM | POA: Diagnosis not present

## 2015-05-27 DIAGNOSIS — F17210 Nicotine dependence, cigarettes, uncomplicated: Secondary | ICD-10-CM | POA: Insufficient documentation

## 2015-05-27 DIAGNOSIS — I10 Essential (primary) hypertension: Secondary | ICD-10-CM | POA: Diagnosis not present

## 2015-05-27 DIAGNOSIS — Z79899 Other long term (current) drug therapy: Secondary | ICD-10-CM | POA: Diagnosis not present

## 2015-05-27 DIAGNOSIS — M549 Dorsalgia, unspecified: Secondary | ICD-10-CM

## 2015-05-27 DIAGNOSIS — Z7982 Long term (current) use of aspirin: Secondary | ICD-10-CM | POA: Diagnosis not present

## 2015-05-27 DIAGNOSIS — M545 Low back pain: Secondary | ICD-10-CM | POA: Diagnosis present

## 2015-05-27 DIAGNOSIS — R52 Pain, unspecified: Secondary | ICD-10-CM

## 2015-05-27 MED ORDER — ORPHENADRINE CITRATE ER 100 MG PO TB12: 100.0000 mg | ORAL_TABLET | Freq: Two times a day (BID) | ORAL | Status: DC

## 2015-05-27 MED ORDER — NAPROXEN 375 MG PO TABS: 375.0000 mg | ORAL_TABLET | Freq: Two times a day (BID) | ORAL | Status: DC

## 2015-05-27 NOTE — ED Notes (Signed)
Per [facility]. Pt from [facility]. Pt told nursing home staff she was having back pain which is new for pt. Pt has hx of CVA and seizures. Pt complains whenever touched. Pt specifically complains of lower back pain, alternating flank pain and leg pain. Pt became up verbally aggressive with [facility] when asked if she drinks etoh. Fell asleep afterwards during transport.

## 2015-05-27 NOTE — Discharge Instructions (Signed)

## 2015-05-27 NOTE — ED Notes (Signed)
Pt reports pain from posterior shoulder to hip on L side and shoulder down to leg on R side.

## 2015-05-27 NOTE — ED Provider Notes (Signed)
CSN: [id number]     Arrival date & time 05/27/15  1448 History   First MD Initiated Contact with Patient 05/27/15 1646     Chief Complaint  Patient presents with  . Back Pain     (Consider location/radiation/quality/duration/timing/severity/associated sxs/prior Treatment) HPI Patient is reporting pain. She is indicating pain over nearly the entire back. First she indicates the left side from her shoulder down to her lower back. Then she indicates her right side from the shoulder almost to her knee. In doing so the patient does reposition and twist and reach her arms around 2 illustrates the areas involved. Patient has some cognitive delay which makes getting a detailed history challenging. She does not endorse any recent injury. She describes some older injuries. The duration of pain per nursing home was reportedly today however patient indicates pain that's been more long-standing on the left and now also includes most of the right side of her body. She is not endorsing associated cough or shortness of breath. She is not endorsing urinary symptoms. Past Medical History  Diagnosis Date  . GERD (gastroesophageal reflux disease)   . Seizures ([place])   . Hypertension   . B12 deficiency   . Vitamin D deficiency disease   . Stroke [facility])     residual right sided deficits, expressive dysphasia   History reviewed. No pertinent past surgical history. Family History  Problem Relation Age of Onset  . Hypertension Mother   . Hypertension Father   . Kidney disease Father   . Hyperlipidemia Father   . Hypertension Sister   . Diabetes Sister    Social History  Substance Use Topics  . Smoking status: Current Every Day Smoker -- 0.25 packs/day for 40 years    Types: Cigarettes  . Smokeless tobacco: None  . Alcohol Use: 8.4 oz/week    0 Standard drinks or equivalent, 14 Cans of beer per week     Comment: Sometimes.   OB History    No data available     Review of Systems  10 Systems  reviewed and are negative for acute change except as noted in the HPI.   Allergies  Penicillins  Home Medications   Prior to Admission medications   Medication Sig Start Date End Date Taking? Authorizing Provider  aspirin 81 MG chewable tablet Chew 1 tablet (81 mg total) by mouth daily. 12/22/14  Yes Jule Ser, DO  atorvastatin (LIPITOR) 40 MG tablet TAKE 1 TABLET BY MOUTH DAILY 04/21/15  Yes Jule Ser, DO  Lacosamide (VIMPAT) 150 MG TABS Take 150 mg by mouth 2 (two) times daily.   Yes Historical Provider, MD  levETIRAcetam (KEPPRA) 750 MG tablet Take 2 tablets (1,500 mg total) by mouth 2 (two) times daily. Patient taking differently: Take 1,500 mg by mouth 3 (three) times daily.  03/01/15  Yes Alexa Angela Burke, MD  Linaclotide (LINZESS) 145 MCG CAPS capsule Take 1 capsule (145 mcg total) by mouth daily. 12/22/14  Yes Jule Ser, DO  lisinopril-hydrochlorothiazide (ZESTORETIC) 10-12.5 MG tablet Take 1 tablet by mouth daily. 12/22/14 12/21/15 Yes Jule Ser, DO  megestrol (MEGACE) 400 MG/10ML suspension Take 5 mLs (200 mg total) by mouth 3 (three) times daily with meals. Patient taking differently: Take 400 mg by mouth 2 (two) times daily.  12/22/14  Yes Jule Ser, DO  Multiple Vitamin (MULTIVITAMIN WITH MINERALS) TABS tablet Take 1 tablet by mouth daily. 10/31/14  Yes Iline Oven, MD  omeprazole (PRILOSEC) 20 MG capsule Take 1 capsule (20  mg total) by mouth daily. 12/22/14 12/21/15 Yes Jule Ser, DO  phenytoin (DILANTIN) 125 MG/5ML suspension Take 100 mg by mouth every 8 (eight) hours.   Yes Historical Provider, MD  potassium chloride (K-DUR) 10 MEQ tablet Take 2 tablets (20 mEq total) by mouth daily. 12/22/14  Yes Jule Ser, DO  albuterol (PROVENTIL) (5 MG/ML) 0.5% nebulizer solution Take 0.5 mLs (2.5 mg total) by nebulization every 6 (six) hours as needed for wheezing or shortness of breath. Patient not taking: Reported on 02/27/2015 10/14/14   Jule Ser, DO  ergocalciferol (VITAMIN D2) 50000 UNITS capsule Take 1 capsule (50,000 Units total) by mouth once a week. Patient not taking: Reported on 05/27/2015 12/22/14   Jule Ser, DO  lacosamide (VIMPAT) 200 MG TABS tablet Take 1 tablet (200 mg total) by mouth 2 (two) times daily. Patient not taking: Reported on 05/27/2015 12/22/14   Jule Ser, DO  phenytoin (DILANTIN) 50 MG tablet Chew 2 tablets (100 mg total) by mouth 3 (three) times daily. Patient not taking: Reported on 05/27/2015 03/03/15   Leo Grosser, MD   BP 109/71 mmHg  Pulse 73  Temp(Src) 98 F (36.7 C) (Oral)  SpO2 100% Physical Exam  Constitutional:  The patient is alert and sitting at the edge of the stretcher. She is nontoxic and has no respiratory distress. She is moving about frequently reposition herself. Patient has some speech impediment and cognitive delay which are limiting factors to detailed history.  HENT:  Head: Normocephalic and atraumatic.  Nose: Nose normal.  Eyes: EOM are normal.  Neck: Neck supple.  Cardiovascular: Normal rate, regular rhythm, normal heart sounds and intact distal pulses.   Pulmonary/Chest: Effort normal and breath sounds normal.  Although patient indicates pain throughout all of her thoracic region. The visual inspection is normal without any rashes, contusions or abrasions. At times as I touch her thorax there is no indication of pain and then at other times she is wincing and twisting about. With the motions she is exhibiting, she shows good range of motion and ability for intact movements.  Abdominal: Soft. Bowel sounds are normal. She exhibits no distension. There is no tenderness.  Musculoskeletal: Normal range of motion. She exhibits tenderness. She exhibits no edema.  The examination of the extremities is normal without edema or deformity. No effusion at the knees or ankles. Upper extremity examinations again are 4 well-developed extremities without deformity or joint  effusions. In examining the patient she is alternately bearing weight and using her upper extremities to wrap around her body and illustrate areas of pain. Her own activities, she is illustrating good range of motion and good baseline function.  Neurological: She is alert. She exhibits normal muscle tone. Coordination normal.  Skin: Skin is warm and dry.    ED Course  Procedures (including critical care time) Labs Review Labs Reviewed  URINALYSIS, ROUTINE W REFLEX MICROSCOPIC (NOT AT [facility])    Imaging Review No results found. I have personally reviewed and evaluated these images and lab results as part of my medical decision-making.   EKG Interpretation None      MDM   Final diagnoses:  Generalized pain   As a historian, the patient is difficult. There is no localization of pain and her complaint is very nonspecific. In general appearance, the patient as well. Clinically she does not have signs of acute illness. On physical examination she is showing good range of motion and use of her extremities by illustrating her areas of pain and  standing and twisting and twisting. She does not illustrate a neurologic deficit that would suggest a cord lesion. At this time, I will check a urinalysis to make sure her pain expression does not indicate kidney infection and also a chest x-ray to assess for the generalized discomfort she expresses throughout her back. Patient's chest x-ray does not show any acute findings. She does not show any signs of respiratory distress. Has not produced a urine specimen. At this time she wishes to leave. She is well in appearance and will be discharged to follow-up with her primary care provider. This appears most likely musculoskeletal pain and will be treated with naproxen and Norflex.    Charlesetta Shanks, MD 05/27/15 [phone]

## 2015-07-15 ENCOUNTER — Emergency Department (HOSPITAL_COMMUNITY)
Admission: EM | Admit: 2015-07-15 | Discharge: 2015-07-15 | Disposition: A | Discharge status: Home / Self Care | Payer: Medicare Other | Attending: Emergency Medicine | Admitting: Emergency Medicine

## 2015-07-15 ENCOUNTER — Emergency Department (HOSPITAL_COMMUNITY): Payer: Medicare Other

## 2015-07-15 ENCOUNTER — Encounter (HOSPITAL_COMMUNITY): Payer: Self-pay | Admitting: Emergency Medicine

## 2015-07-15 DIAGNOSIS — R7889 Finding of other specified substances, not normally found in blood: Secondary | ICD-10-CM

## 2015-07-15 DIAGNOSIS — F10920 Alcohol use, unspecified with intoxication, uncomplicated: Secondary | ICD-10-CM

## 2015-07-15 DIAGNOSIS — Z8673 Personal history of transient ischemic attack (TIA), and cerebral infarction without residual deficits: Secondary | ICD-10-CM | POA: Diagnosis not present

## 2015-07-15 DIAGNOSIS — Z5181 Encounter for therapeutic drug level monitoring: Secondary | ICD-10-CM | POA: Diagnosis not present

## 2015-07-15 DIAGNOSIS — R4182 Altered mental status, unspecified: Secondary | ICD-10-CM | POA: Diagnosis present

## 2015-07-15 DIAGNOSIS — F17210 Nicotine dependence, cigarettes, uncomplicated: Secondary | ICD-10-CM | POA: Diagnosis not present

## 2015-07-15 DIAGNOSIS — Z7982 Long term (current) use of aspirin: Secondary | ICD-10-CM | POA: Insufficient documentation

## 2015-07-15 DIAGNOSIS — Z79899 Other long term (current) drug therapy: Secondary | ICD-10-CM | POA: Insufficient documentation

## 2015-07-15 DIAGNOSIS — I10 Essential (primary) hypertension: Secondary | ICD-10-CM | POA: Insufficient documentation

## 2015-07-15 DIAGNOSIS — F10229 Alcohol dependence with intoxication, unspecified: Secondary | ICD-10-CM | POA: Diagnosis not present

## 2015-07-15 LAB — URINALYSIS, ROUTINE W REFLEX MICROSCOPIC
BILIRUBIN URINE: NEGATIVE
Glucose, UA: NEGATIVE mg/dL
HGB URINE DIPSTICK: NEGATIVE
KETONES UR: NEGATIVE mg/dL
Leukocytes, UA: NEGATIVE
Nitrite: NEGATIVE
PH: 5.5 (ref 5.0–8.0)
Protein, ur: NEGATIVE mg/dL
SPECIFIC GRAVITY, URINE: 1.012 (ref 1.005–1.030)

## 2015-07-15 LAB — BASIC METABOLIC PANEL
Anion gap: 7 (ref 5–15)
BUN: 9 mg/dL (ref 6–20)
CHLORIDE: 112 mmol/L — AB (ref 101–111)
CO2: 20 mmol/L — AB (ref 22–32)
CREATININE: 0.88 mg/dL (ref 0.44–1.00)
Calcium: 9.3 mg/dL (ref 8.9–10.3)
GFR calc non Af Amer: >60 mL/min (ref >60)
GLUCOSE: 77 mg/dL (ref 65–99)
Potassium: 4.5 mmol/L (ref 3.5–5.1)
Sodium: 139 mmol/L (ref 135–145)

## 2015-07-15 LAB — CBC WITH DIFFERENTIAL/PLATELET
BASOS PCT: 0 %
Basophils Absolute: 0 10*3/uL (ref 0.0–0.1)
Eosinophils Absolute: 0.4 10*3/uL (ref 0.0–0.7)
Eosinophils Relative: 7 %
HEMATOCRIT: 32.1 % — AB (ref 36.0–46.0)
HEMOGLOBIN: 11.1 g/dL — AB (ref 12.0–15.0)
LYMPHS ABS: 2.5 10*3/uL (ref 0.7–4.0)
LYMPHS PCT: 42 %
MCH: 29.6 pg (ref 26.0–34.0)
MCHC: 34.6 g/dL (ref 30.0–36.0)
MCV: 85.6 fL (ref 78.0–100.0)
MONOS PCT: 7 %
Monocytes Absolute: 0.4 10*3/uL (ref 0.1–1.0)
NEUTROS ABS: 2.6 10*3/uL (ref 1.7–7.7)
NEUTROS PCT: 44 %
Platelets: 296 10*3/uL (ref 150–400)
RBC: 3.75 MIL/uL — ABNORMAL LOW (ref 3.87–5.11)
RDW: 13.3 % (ref 11.5–15.5)
WBC: 5.9 10*3/uL (ref 4.0–10.5)

## 2015-07-15 LAB — PHENYTOIN LEVEL, TOTAL: Phenytoin Lvl: 25.1 ug/mL — ABNORMAL HIGH (ref 10.0–20.0)

## 2015-07-15 LAB — ETHANOL: Alcohol, Ethyl (B): 89 mg/dL — ABNORMAL HIGH (ref <5)

## 2015-07-15 NOTE — ED Notes (Signed)
Pt becoming more agitated, wanting to go home. Took gown off, putting clothes on.

## 2015-07-15 NOTE — ED Notes (Signed)
Report given to Rip Harbour, Therapist, sports at [facility]. States pt was witnessed returning to nursing home with a beer in a brown bag.

## 2015-07-15 NOTE — ED Provider Notes (Signed)
CSN: [id number]     Arrival date & time 07/15/15  1459 History   First MD Initiated Contact with Patient 07/15/15 1525     Chief Complaint  Patient presents with  . Altered Mental Status   History, physical exam, and ROS limited by patients mental status changes.   (Consider location/radiation/quality/duration/timing/severity/associated sxs/prior Treatment) Patient is a 60 y.o. female presenting with altered mental status. The history is provided by the patient and the EMS personnel.  Altered Mental Status Presenting symptoms: behavior changes, combativeness and confusion   Severity:  Moderate Most recent episode:  Today Episode history:  Continuous Timing:  Constant Progression:  Partially resolved Chronicity:  New Context: alcohol use   Associated symptoms: no abdominal pain, no light-headedness, no palpitations and no weakness     Past Medical History  Diagnosis Date  . GERD (gastroesophageal reflux disease)   . Seizures ([place])   . Hypertension   . B12 deficiency   . Vitamin D deficiency disease   . Stroke [facility])     residual right sided deficits, expressive dysphasia   History reviewed. No pertinent past surgical history. Family History  Problem Relation Age of Onset  . Hypertension Mother   . Hypertension Father   . Kidney disease Father   . Hyperlipidemia Father   . Hypertension Sister   . Diabetes Sister    Social History  Substance Use Topics  . Smoking status: Current Every Day Smoker -- 0.25 packs/day for 40 years    Types: Cigarettes  . Smokeless tobacco: None  . Alcohol Use: 8.4 oz/week    0 Standard drinks or equivalent, 14 Cans of beer per week     Comment: Sometimes.   OB History    No data available     Review of Systems  Unable to perform ROS: Mental status change  Respiratory: Negative for chest tightness.   Cardiovascular: Negative for chest pain and palpitations.  Gastrointestinal: Negative for abdominal pain.  Genitourinary: Negative for  dysuria.  Musculoskeletal: Positive for back pain. Negative for neck pain.  Neurological: Negative for dizziness, syncope, weakness and light-headedness.  Psychiatric/Behavioral: Positive for confusion.      Allergies  Penicillins  Home Medications   Prior to Admission medications   Medication Sig Start Date End Date Taking? Authorizing Provider  albuterol (PROVENTIL) (5 MG/ML) 0.5% nebulizer solution Take 0.5 mLs (2.5 mg total) by nebulization every 6 (six) hours as needed for wheezing or shortness of breath. Patient not taking: Reported on 02/27/2015 10/14/14   Jule Ser, DO  aspirin 81 MG chewable tablet Chew 1 tablet (81 mg total) by mouth daily. 12/22/14   Jule Ser, DO  atorvastatin (LIPITOR) 40 MG tablet TAKE 1 TABLET BY MOUTH DAILY 04/21/15   Jule Ser, DO  ergocalciferol (VITAMIN D2) 50000 UNITS capsule Take 1 capsule (50,000 Units total) by mouth once a week. Patient not taking: Reported on 05/27/2015 12/22/14   Jule Ser, DO  Lacosamide (VIMPAT) 150 MG TABS Take 150 mg by mouth 2 (two) times daily.    Historical Provider, MD  lacosamide (VIMPAT) 200 MG TABS tablet Take 1 tablet (200 mg total) by mouth 2 (two) times daily. Patient not taking: Reported on 05/27/2015 12/22/14   Jule Ser, DO  levETIRAcetam (KEPPRA) 750 MG tablet Take 2 tablets (1,500 mg total) by mouth 2 (two) times daily. Patient taking differently: Take 1,500 mg by mouth 3 (three) times daily.  03/01/15   Florinda Marker, MD  Linaclotide (LINZESS) 145 MCG CAPS capsule  Take 1 capsule (145 mcg total) by mouth daily. 12/22/14   Jule Ser, DO  lisinopril-hydrochlorothiazide (ZESTORETIC) 10-12.5 MG tablet Take 1 tablet by mouth daily. 12/22/14 12/21/15  Jule Ser, DO  megestrol (MEGACE) 400 MG/10ML suspension Take 5 mLs (200 mg total) by mouth 3 (three) times daily with meals. Patient taking differently: Take 400 mg by mouth 2 (two) times daily.  12/22/14   Jule Ser, DO  Multiple  Vitamin (MULTIVITAMIN WITH MINERALS) TABS tablet Take 1 tablet by mouth daily. 10/31/14   Iline Oven, MD  naproxen (NAPROSYN) 375 MG tablet Take 1 tablet (375 mg total) by mouth 2 (two) times daily. 05/27/15   Charlesetta Shanks, MD  omeprazole (PRILOSEC) 20 MG capsule Take 1 capsule (20 mg total) by mouth daily. 12/22/14 12/21/15  Jule Ser, DO  orphenadrine (NORFLEX) 100 MG tablet Take 1 tablet (100 mg total) by mouth 2 (two) times daily. 05/27/15   Charlesetta Shanks, MD  phenytoin (DILANTIN) 125 MG/5ML suspension Take 100 mg by mouth every 8 (eight) hours.    Historical Provider, MD  phenytoin (DILANTIN) 50 MG tablet Chew 2 tablets (100 mg total) by mouth 3 (three) times daily. Patient not taking: Reported on 05/27/2015 03/03/15   Leo Grosser, MD  potassium chloride (K-DUR) 10 MEQ tablet Take 2 tablets (20 mEq total) by mouth daily. 12/22/14   Jule Ser, DO   BP 133/79 mmHg  Pulse 82  Temp(Src) 97.2 F (36.2 C) (Oral)  Resp 16  SpO2 100% Physical Exam  Constitutional: She is oriented to person, place, and time. She appears well-developed and well-nourished. No distress.  HENT:  Head: Normocephalic and atraumatic.  Eyes: Conjunctivae and EOM are normal. Pupils are equal, round, and reactive to light.  Neck: Normal range of motion. Neck supple.  Cardiovascular: Normal rate, regular rhythm and normal heart sounds.   Pulmonary/Chest: Effort normal and breath sounds normal. No respiratory distress.  Abdominal: Soft. Bowel sounds are normal. There is no tenderness.  Musculoskeletal: Normal range of motion.  Neurological: She is alert and oriented to person, place, and time. She has normal reflexes. No cranial nerve deficit.  Unable to perform due to patient compliance.   Skin: Skin is warm and dry. She is not diaphoretic.  Psychiatric: Her affect is angry. She is agitated and aggressive.    ED Course  Procedures (including critical care time) Labs Review Labs Reviewed  CBC WITH  DIFFERENTIAL/PLATELET - Abnormal; Notable for the following:    RBC 3.75 (*)    Hemoglobin 11.1 (*)    HCT 32.1 (*)    All other components within normal limits  BASIC METABOLIC PANEL - Abnormal; Notable for the following:    Chloride 112 (*)    CO2 20 (*)    All other components within normal limits  ETHANOL - Abnormal; Notable for the following:    Alcohol, Ethyl (B) 89 (*)    All other components within normal limits  PHENYTOIN LEVEL, TOTAL - Abnormal; Notable for the following:    Phenytoin Lvl 25.1 (*)    All other components within normal limits  URINE CULTURE  URINALYSIS, ROUTINE W REFLEX MICROSCOPIC (NOT AT [facility])    Imaging Review Ct Head Wo Contrast  07/15/2015  CLINICAL DATA:  Altered mental status and possible fall. Back pain. Old stroke with right-sided weakness. Initial encounter. EXAM: CT HEAD WITHOUT CONTRAST CT CERVICAL SPINE WITHOUT CONTRAST TECHNIQUE: Multidetector CT imaging of the head and cervical spine was performed following the standard protocol without  intravenous contrast. Multiplanar CT image reconstructions of the cervical spine were also generated. COMPARISON:  Head CT 02/27/2015 FINDINGS: CT HEAD FINDINGS There is no evidence of acute cortical infarct, intracranial hemorrhage, mass, midline shift, or extra-axial fluid collection. A chronic left MCA infarct is again seen involving the left insula, basal ganglia, and frontal and parietal lobes predominantly at the level of the operculum. There is with associated ex vacuo dilatation of the left lateral ventricle, unchanged. Orbits are unremarkable. The visualized paranasal sinuses and mastoid air cells are clear. Calcified atherosclerosis is noted at the skullbase. No skull fracture is identified. CT CERVICAL SPINE FINDINGS There is trace anterolisthesis as C6 on C7, likely degenerative. Vertebral body heights are preserved. There is mild platybasia and mild basilar invagination, with the tip of the dens projecting 6 mm  above Chamberlain's line. No cervical spine fracture is identified. There is mild cervical spondylosis. Vacuum disc phenomenon is present at C5-6 with disc bulging and uncovertebral spurring resulting in likely mild spinal stenosis and mild bilateral neural foraminal stenosis. Bilateral cervical ribs are noted. There is mild pleural-parenchymal scarring in the lung apices. There is no evidence of prevertebral soft tissue swelling. IMPRESSION: 1. No evidence of acute intracranial abnormality. 2. Chronic left MCA infarct. 3. No acute osseous abnormality identified in the cervical spine. 4. Mild platybasia, mild basilar invagination, and mild cervical spondylosis. Electronically Signed   By: Logan Bores M.D.   On: 07/15/2015 17:12   Ct Cervical Spine Wo Contrast  07/15/2015  CLINICAL DATA:  Altered mental status and possible fall. Back pain. Old stroke with right-sided weakness. Initial encounter. EXAM: CT HEAD WITHOUT CONTRAST CT CERVICAL SPINE WITHOUT CONTRAST TECHNIQUE: Multidetector CT imaging of the head and cervical spine was performed following the standard protocol without intravenous contrast. Multiplanar CT image reconstructions of the cervical spine were also generated. COMPARISON:  Head CT 02/27/2015 FINDINGS: CT HEAD FINDINGS There is no evidence of acute cortical infarct, intracranial hemorrhage, mass, midline shift, or extra-axial fluid collection. A chronic left MCA infarct is again seen involving the left insula, basal ganglia, and frontal and parietal lobes predominantly at the level of the operculum. There is with associated ex vacuo dilatation of the left lateral ventricle, unchanged. Orbits are unremarkable. The visualized paranasal sinuses and mastoid air cells are clear. Calcified atherosclerosis is noted at the skullbase. No skull fracture is identified. CT CERVICAL SPINE FINDINGS There is trace anterolisthesis as C6 on C7, likely degenerative. Vertebral body heights are preserved. There is  mild platybasia and mild basilar invagination, with the tip of the dens projecting 6 mm above Chamberlain's line. No cervical spine fracture is identified. There is mild cervical spondylosis. Vacuum disc phenomenon is present at C5-6 with disc bulging and uncovertebral spurring resulting in likely mild spinal stenosis and mild bilateral neural foraminal stenosis. Bilateral cervical ribs are noted. There is mild pleural-parenchymal scarring in the lung apices. There is no evidence of prevertebral soft tissue swelling. IMPRESSION: 1. No evidence of acute intracranial abnormality. 2. Chronic left MCA infarct. 3. No acute osseous abnormality identified in the cervical spine. 4. Mild platybasia, mild basilar invagination, and mild cervical spondylosis. Electronically Signed   By: Logan Bores M.D.   On: 07/15/2015 17:12   I have personally reviewed and evaluated these images and lab results as part of my medical decision-making.   EKG Interpretation None      MDM   Final diagnoses:  Alcohol intoxication, uncomplicated (HCC)  Elevated Dilantin level    Pt  is a 60 yo female presenting for AMS from facility.  Hx of stroke with baseline slurred speech and R sided deficits.  Per facility possible Etoh use today.   On exam pt walking with slurred speech and R sided weekness.  Angry, aggressive,and combative at times but redirectable.  Unable to perform full neuro exam but cranial nerves intact and pt ambulating easily in the ED on her own.  Grip strength equal.  Nursing discussed with care facility and pt appears to be at baseline.  CT head and C spine unremarkable without signs of additional injury on exam.  CBC, BMP, UA unremarkable.  Phenytoin level found to be elevated in the ED to 25.  Discussed with care facility to hold medication for two days with level recheck and to begin if WNL.  Etoh found to be elevated and likely cause of patients aggressive behavior.   Labs were viewed by myself  incorporated  into medical decision making.  Discussed pertinent finding with patient or caregiver prior to discharge with no further questions.  Immediate return precautions given and understood.  Medical decision making supervised by my attending Dr. Canary Brim.   Geronimo Boot, MD PGY-3 Emergency Medicine      Geronimo Boot, MD 07/16/15 [place], MD 07/16/15 [phone]

## 2015-07-15 NOTE — ED Notes (Addendum)
Pt c/o sore throat -- throat not red, states teeth do not hurt. Pt grinds teeth-- c/o jaw pain.

## 2015-07-15 NOTE — ED Notes (Signed)
Pt here from [facility] with c/o aloc and fall , pt has history of stroke with right side weakness from old stroke , pt has strong smell of etoh , pt only complaint is back pain

## 2015-07-17 LAB — URINE CULTURE

## 2015-09-29 ENCOUNTER — Ambulatory Visit: Payer: Medicare Other | Admitting: Diagnostic Neuroimaging

## 2015-10-01 ENCOUNTER — Ambulatory Visit (INDEPENDENT_AMBULATORY_CARE_PROVIDER_SITE_OTHER): Payer: Medicare Other | Admitting: Diagnostic Neuroimaging

## 2015-10-01 ENCOUNTER — Encounter: Payer: Self-pay | Admitting: Diagnostic Neuroimaging

## 2015-10-01 VITALS — BP 105/67 | HR 76 | Ht 63.0 in | Wt 89.8 lb

## 2015-10-01 DIAGNOSIS — Z8673 Personal history of transient ischemic attack (TIA), and cerebral infarction without residual deficits: Secondary | ICD-10-CM

## 2015-10-01 DIAGNOSIS — I6930 Unspecified sequelae of cerebral infarction: Secondary | ICD-10-CM

## 2015-10-01 DIAGNOSIS — R4701 Aphasia: Secondary | ICD-10-CM

## 2015-10-01 DIAGNOSIS — F1010 Alcohol abuse, uncomplicated: Secondary | ICD-10-CM

## 2015-10-01 DIAGNOSIS — G40909 Epilepsy, unspecified, not intractable, without status epilepticus: Secondary | ICD-10-CM

## 2015-10-01 NOTE — Progress Notes (Signed)
[facility]  PATIENT: Tina Sanders DOB: [birth year]  REFERRING CLINICIAN: Lesia Hausen, PA-c HISTORY FROM: patient, caregiver, EPIC notes REASON FOR VISIT: new consult    HISTORICAL  CHIEF COMPLAINT:  Chief Complaint  Patient presents with  . Seizures    She resides in an assisted living facility [facility]).  She is here with her caregiver, Roderic Ovens.  They are unsure when her last seizure occurred but thinks it may have been last month.    HISTORY OF PRESENT ILLNESS:   60 year old female here for evaluation of seizure disorder. Patient arrives with transport/caregiver from her assisted living facility. I reviewed referring provider notes, Emergency room notes, imaging and lab results. Summary patient has history of left frontal ischemic infarction many years ago (date not exactly known) with subsequent seizure disorder. Apparently she has history of chronic alcohol abuse as well. Unfortunately patient, caregiver and facility or not able to provide me any additional information. No one is sure when her seizures for started. Apparently her last seizure was 23 months ago but no one can give me a description of the event. Patient is currently on 3 antiseizure medications including levetiracetam, Vimpat, Dilantin. Patient has a family member who lives in [place] is not available at this office visit today or by phone.   REVIEW OF SYSTEMS: Full 14 system review of systems performed and negative with exception of: Seizure disorder.  ALLERGIES: Allergies  Allergen Reactions  . Penicillins Other (See Comments)    abdominal bloating Has patient had a PCN reaction causing immediate rash, facial/tongue/throat swelling, SOB or lightheadedness with hypotension: No Has patient had a PCN reaction causing severe rash involving mucus membranes or skin necrosis: No Has patient had a PCN reaction that required hospitalization No Has patient had a PCN reaction occurring  within the last 10 years: No If all of the above answers are "NO", then may proceed with Cephalosporin use.     HOME MEDICATIONS: Outpatient Medications Prior to Visit  Medication Sig Dispense Refill  . albuterol (PROVENTIL) (5 MG/ML) 0.5% nebulizer solution Take 0.5 mLs (2.5 mg total) by nebulization every 6 (six) hours as needed for wheezing or shortness of breath. 20 mL 12  . aspirin 81 MG chewable tablet Chew 1 tablet (81 mg total) by mouth daily. 90 tablet 3  . atorvastatin (LIPITOR) 40 MG tablet TAKE 1 TABLET BY MOUTH DAILY 30 tablet 11  . Lacosamide (VIMPAT) 150 MG TABS Take 150 mg by mouth 2 (two) times daily.    Marland Kitchen levETIRAcetam (KEPPRA) 750 MG tablet Take 2 tablets (1,500 mg total) by mouth 2 (two) times daily. (Patient taking differently: Take 1,500 mg by mouth 3 (three) times daily. ) 360 tablet 3  . Linaclotide (LINZESS) 145 MCG CAPS capsule Take 1 capsule (145 mcg total) by mouth daily. 90 capsule 3  . lisinopril-hydrochlorothiazide (ZESTORETIC) 10-12.5 MG tablet Take 1 tablet by mouth daily. 90 tablet 3  . Multiple Vitamin (MULTIVITAMIN WITH MINERALS) TABS tablet Take 1 tablet by mouth daily.    Marland Kitchen omeprazole (PRILOSEC) 20 MG capsule Take 1 capsule (20 mg total) by mouth daily. 90 capsule 3  . phenytoin (DILANTIN) 125 MG/5ML suspension Take 100 mg by mouth every 8 (eight) hours.    Marland Kitchen lacosamide (VIMPAT) 200 MG TABS tablet Take 1 tablet (200 mg total) by mouth 2 (two) times daily. 180 tablet 3  . ergocalciferol (VITAMIN D2) 50000 UNITS capsule Take 1 capsule (50,000 Units total) by mouth once a week. (Patient not taking:  Reported on 05/27/2015) 12 capsule 3  . megestrol (MEGACE) 400 MG/10ML suspension Take 5 mLs (200 mg total) by mouth 3 (three) times daily with meals. (Patient taking differently: Take 400 mg by mouth 2 (two) times daily. ) 1350 mL 3  . naproxen (NAPROSYN) 375 MG tablet Take 1 tablet (375 mg total) by mouth 2 (two) times daily. 20 tablet 0  . orphenadrine (NORFLEX)  100 MG tablet Take 1 tablet (100 mg total) by mouth 2 (two) times daily. 30 tablet 0  . phenytoin (DILANTIN) 50 MG tablet Chew 2 tablets (100 mg total) by mouth 3 (three) times daily. (Patient not taking: Reported on 05/27/2015) 30 tablet 0  . potassium chloride (K-DUR) 10 MEQ tablet Take 2 tablets (20 mEq total) by mouth daily. 180 tablet 3   No facility-administered medications prior to visit.     PAST MEDICAL HISTORY: Past Medical History:  Diagnosis Date  . Alcohol abuse   . B12 deficiency   . GERD (gastroesophageal reflux disease)   . Hypertension   . IBS (irritable bowel syndrome)   . Seizures ([place])   . 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 Stroke [facility])    residual right sided deficits, expressive dysphasia  . Vitamin D deficiency disease   . Vitamin E deficiency     PAST SURGICAL HISTORY: History reviewed. No pertinent surgical history.  FAMILY HISTORY: Family History  Problem Relation Age of Onset  . Hypertension Mother   . Hypertension Father   . Kidney disease Father   . Hyperlipidemia Father   . Hypertension Sister   . Diabetes Sister     SOCIAL HISTORY:  Social History   Social History  . Marital status: Single    Spouse name: N/A  . Number of children: 0  . Years of education: HS   Occupational History  . Disabled    Social History Main Topics  . Smoking status: Current Every Day Smoker    Packs/day: 0.25    Years: 40.00    Types: Cigarettes  . Smokeless tobacco: Never Used  . Alcohol use No     Comment: Reports not drinking now.  . Drug use: No  . Sexual activity: Not on file   Other Topics Concern  . Not on file   Social History Narrative   Lives at [facility] (Assisted Living).   Right-handed.   No caffeine use.     PHYSICAL EXAM  GENERAL EXAM/CONSTITUTIONAL: Vitals:  Vitals:   10/01/15 0900  BP: 105/67  Pulse: 76  Weight: 89 lb 12.8 oz (40.7 kg)  Height: 5\' 3"  (1.6 m)     Body mass index is 15.91 kg/m.  No exam  data present  Patient is in no distress; well developed, nourished and groomed; neck is supple  CARDIOVASCULAR:  Examination of carotid arteries is normal; no carotid bruits  Regular rate and rhythm, no murmurs  Examination of peripheral vascular system by observation and palpation is normal  POOR DENTITION  THIN, FRAIL APPEARING  EYES:  Ophthalmoscopic exam of optic discs and posterior segments is normal; no papilledema or hemorrhages  MUSCULOSKELETAL:  Gait, strength, tone, movements noted in Neurologic exam below  NEUROLOGIC: MENTAL STATUS:  No flowsheet data found.  EXPRESSIVE APHASIA  awake, alert, oriented to self; NOT PLACE OR TIME; NOT ABLE TO TELL ME HER NAME  DECR memory   DECRE attention and concentration  DECR FLUENCY, DECR COMPREHENSION, DECR NAMING   DECR fund of knowledge  CRANIAL NERVE:  2nd - no papilledema on fundoscopic exam  2nd, 3rd, 4th, 6th - pupils equal and reactive to light, visual fields full to confrontation, extraocular muscles intact, no nystagmus  5th - facial sensation symmetric  7th - facial strength symmetric  8th - hearing intact  9th - palate elevates symmetrically, uvula midline  11th - shoulder shrug symmetric  12th - tongue protrusion midline  SEVERE DYSARTHRIA  MOTOR:   normal bulk  INCREASED TONE ON RIGHT ARM AND RIGHT LEG  DECR RUE AND RLE (4/5)  SLOW MOVEMENTS ON RIGHT SIDE  RIGHT HAND FLEXED  SENSORY:   normal and symmetric to light touch and symm  COORDINATION:   finger-nose-finger, fine finger movements SLOW ON RIGHT SIDE  REFLEXES:   deep tendon reflexes present; SLIGHTLY BRISK ON RIGHT ARM AND RIGHT KNEE  GAIT/STATION:   narrow based gait; able to walk on toes, heels and tandem; romberg is negative    DIAGNOSTIC DATA (LABS, IMAGING, TESTING) - I reviewed patient records, labs, notes, testing and imaging myself where available.  Lab Results  Component Value Date   WBC 5.9  07/15/2015   HGB 11.1 (L) 07/15/2015   HCT 32.1 (L) 07/15/2015   MCV 85.6 07/15/2015   PLT 296 07/15/2015      Component Value Date/Time   NA 139 07/15/2015 1620   NA 136 11/13/2014 1018   K 4.5 07/15/2015 1620   CL 112 (H) 07/15/2015 1620   CO2 20 (L) 07/15/2015 1620   GLUCOSE 77 07/15/2015 1620   BUN 9 07/15/2015 1620   BUN 10 11/13/2014 1018   CREATININE 0.88 07/15/2015 1620   CALCIUM 9.3 07/15/2015 1620   PROT 6.8 02/27/2015 0959   PROT 7.3 11/13/2014 1018   ALBUMIN 3.7 02/27/2015 0959   ALBUMIN 4.0 11/13/2014 1018   AST 14 (L) 02/27/2015 0959   ALT 7 (L) 02/27/2015 0959   ALKPHOS 61 02/27/2015 0959   BILITOT 0.3 02/27/2015 0959   BILITOT 0.3 11/13/2014 1018   GFRNONAA >60 07/15/2015 1620   GFRAA >60 07/15/2015 1620   Lab Results  Component Value Date   CHOL 213 (H) 10/14/2014   HDL 70 10/14/2014   LDLCALC 132 (H) 10/14/2014   TRIG 57 10/14/2014   CHOLHDL 3.0 10/14/2014   Lab Results  Component Value Date   HGBA1C <4.0 (L) 10/26/2014   Lab Results  Component Value Date   VITAMINB12 1,459 (H) 10/26/2014   Lab Results  Component Value Date   TSH 0.742 10/26/2014    07/15/15 CT head [I reviewed images myself and agree with interpretation. -VRP]  1. No evidence of acute intracranial abnormality. 2. Chronic left MCA infarct. 3. No acute osseous abnormality identified in the cervical spine. 4. Mild platybasia, mild basilar invagination, and mild cervical spondylosis.  03/02/15 EEG - This awake EEG is abnormal due to focal slowing over the left temporal region.     ASSESSMENT AND PLAN  60 y.o. year old female here with history of chronic left MCA ischemic infarction, subsequent expressive aphasia and dysarthria with right hemiparesis, and seizure disorder. Apparently also has history of chronic alcohol abuse. At this point with limited information from patient, caregiver, referring notes, will continue current medications. In future if we get more  information we may be able to make some changes and consolidate medications.   Dx:  1. Chronic ischemic left MCA stroke   2. Expressive aphasia   3. Seizure disorder ([place])   4. Chronic alcohol abuse  PLAN: - ask facility to start seizure log (record date of seizure, symptoms/description, frequency) - continue current meds (levetiracetam, vimpat and dilantin) - in future, if we get more information on seizure history, type, frequency, then we may be able to consider adjusting or weaning medications  Return in about 6 months (around 03/30/2016). or sooner as needed  I reviewed images, labs, notes, records myself. I summarized findings and reviewed with patient, for this high risk condition (uncontrolled seizure disorder) requiring high complexity decision making.    Penni Bombard, MD [id number], [id number] AM Certified in Neurology, Neurophysiology and Neuroimaging  [facility] [address], [place], [place] [address] [phone]

## 2015-12-29 DIAGNOSIS — R070 Pain in throat: Secondary | ICD-10-CM | POA: Insufficient documentation

## 2016-03-31 ENCOUNTER — Encounter: Payer: Self-pay | Admitting: Diagnostic Neuroimaging

## 2016-03-31 ENCOUNTER — Ambulatory Visit (INDEPENDENT_AMBULATORY_CARE_PROVIDER_SITE_OTHER): Payer: Medicare Other | Admitting: Diagnostic Neuroimaging

## 2016-03-31 VITALS — BP 108/59 | HR 89 | Wt 94.8 lb

## 2016-03-31 DIAGNOSIS — I6930 Unspecified sequelae of cerebral infarction: Secondary | ICD-10-CM | POA: Diagnosis not present

## 2016-03-31 DIAGNOSIS — R4701 Aphasia: Secondary | ICD-10-CM

## 2016-03-31 DIAGNOSIS — G40909 Epilepsy, unspecified, not intractable, without status epilepticus: Secondary | ICD-10-CM

## 2016-03-31 NOTE — Progress Notes (Signed)
[facility]  PATIENT: Tina Sanders DOB: 04-12-1955  REFERRING CLINICIAN: Lesia Hausen, PA-c HISTORY FROM: patient, caregiver, EPIC notes REASON FOR VISIT: follow up   HISTORICAL  CHIEF COMPLAINT:  Chief Complaint  Patient presents with  . Chronic ischemic stroke    rm 6, living at [facility], care giver- Roderic Ovens  . Seizures    no seizures   . Follow-up    6 month    HISTORY OF PRESENT ILLNESS:   UPDATE 03/31/16: Since last visit, no seizures. Tolerating meds. No new neuro issues.   PRIOR HPI (10/01/15): 61 year old female here for evaluation of seizure disorder. Patient arrives with transport/caregiver from her assisted living facility. I reviewed referring provider notes, Emergency room notes, imaging and lab results. Summary patient has history of left frontal ischemic infarction many years ago (date not exactly known) with subsequent seizure disorder. Apparently she has history of chronic alcohol abuse as well. Unfortunately patient, caregiver and facility or not able to provide me any additional information. No one is sure when her seizures for started. Apparently her last seizure was 23 months ago but no one can give me a description of the event. Patient is currently on 3 antiseizure medications including levetiracetam, Vimpat, Dilantin. Patient has a family member who lives in [place] is not available at this office visit today or by phone.   REVIEW OF SYSTEMS: Full 14 system review of systems performed and negative with exception of: seizure disorder trouble swallowing confusion depression.   ALLERGIES: Allergies  Allergen Reactions  . Penicillins Other (See Comments)    abdominal bloating Has patient had a PCN reaction causing immediate rash, facial/tongue/throat swelling, SOB or lightheadedness with hypotension: No Has patient had a PCN reaction causing severe rash involving mucus membranes or skin necrosis: No Has patient had a PCN reaction  that required hospitalization No Has patient had a PCN reaction occurring within the last 10 years: No If all of the above answers are "NO", then may proceed with Cephalosporin use.     HOME MEDICATIONS: Outpatient Medications Prior to Visit  Medication Sig Dispense Refill  . aspirin 81 MG chewable tablet Chew 1 tablet (81 mg total) by mouth daily. 90 tablet 3  . atorvastatin (LIPITOR) 40 MG tablet TAKE 1 TABLET BY MOUTH DAILY 30 tablet 11  . cholecalciferol (VITAMIN D) 1000 units tablet Take 1,000 Units by mouth daily.    . Lacosamide (VIMPAT) 150 MG TABS Take 150 mg by mouth 2 (two) times daily.    Marland Kitchen levETIRAcetam (KEPPRA) 750 MG tablet Take 2 tablets (1,500 mg total) by mouth 2 (two) times daily. (Patient taking differently: Take 1,500 mg by mouth 3 (three) times daily. ) 360 tablet 3  . Linaclotide (LINZESS) 145 MCG CAPS capsule Take 1 capsule (145 mcg total) by mouth daily. 90 capsule 3  . Multiple Vitamin (MULTIVITAMIN WITH MINERALS) TABS tablet Take 1 tablet by mouth daily.    . phenytoin (DILANTIN) 125 MG/5ML suspension Take 100 mg by mouth every 8 (eight) hours.    Marland Kitchen acetaminophen (TYLENOL) 325 MG tablet Take 650 mg by mouth every 6 (six) hours as needed.    Marland Kitchen albuterol (PROVENTIL) (5 MG/ML) 0.5% nebulizer solution Take 0.5 mLs (2.5 mg total) by nebulization every 6 (six) hours as needed for wheezing or shortness of breath. (Patient not taking: Reported on 03/31/2016) 20 mL 12  . lisinopril-hydrochlorothiazide (ZESTORETIC) 10-12.5 MG tablet Take 1 tablet by mouth daily. 90 tablet 3  . omeprazole (PRILOSEC) 20 MG  capsule Take 1 capsule (20 mg total) by mouth daily. 90 capsule 3   No facility-administered medications prior to visit.     PAST MEDICAL HISTORY: Past Medical History:  Diagnosis Date  . Alcohol abuse   . B12 deficiency   . GERD (gastroesophageal reflux disease)   . Hypertension   . IBS (irritable bowel syndrome)   . Seizures ([place])   . 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 Stroke [facility])    residual right sided deficits, expressive dysphasia  . Vitamin D deficiency disease   . Vitamin E deficiency     PAST SURGICAL HISTORY: No past surgical history on file.  FAMILY HISTORY: Family History  Problem Relation Age of Onset  . Hypertension Mother   . Hypertension Father   . Kidney disease Father   . Hyperlipidemia Father   . Hypertension Sister   . Diabetes Sister     SOCIAL HISTORY:  Social History   Social History  . Marital status: Single    Spouse name: N/A  . Number of children: 0  . Years of education: HS   Occupational History  . Disabled    Social History Main Topics  . Smoking status: Current Every Day Smoker    Packs/day: 0.25    Years: 40.00    Types: Cigarettes  . Smokeless tobacco: Never Used  . Alcohol use No     Comment: Reports not drinking now.  . Drug use: No  . Sexual activity: Not on file   Other Topics Concern  . Not on file   Social History Narrative   Lives at [facility] (Assisted Living).   Right-handed.   No caffeine use.     PHYSICAL EXAM  GENERAL EXAM/CONSTITUTIONAL: Vitals:  Vitals:   03/31/16 1151  BP: (!) 108/59  Pulse: 89  Weight: 94 lb 12.8 oz (43 kg)   Body mass index is 16.79 kg/m. No exam data present  Patient is in no distress; well developed, nourished and groomed; neck is supple  CARDIOVASCULAR:  Examination of carotid arteries is normal; no carotid bruits  Regular rate and rhythm, no murmurs  Examination of peripheral vascular system by observation and palpation is normal  POOR DENTITION  THIN, FRAIL APPEARING  EYES:  Ophthalmoscopic exam of optic discs and posterior segments is normal; no papilledema or hemorrhages  MUSCULOSKELETAL:  Gait, strength, tone, movements noted in Neurologic exam below  NEUROLOGIC: MENTAL STATUS:  No flowsheet data found.  EXPRESSIVE APHASIA  awake, alert, oriented to self; NOT PLACE OR TIME; ABLE TO TELL ME HER  NAME  DECR memory   DECRE attention and concentration  DECR FLUENCY, DECR COMPREHENSION, DECR NAMING   DECR fund of knowledge  CRANIAL NERVE:   2nd - no papilledema on fundoscopic exam  2nd, 3rd, 4th, 6th - pupils equal and reactive to light, visual fields full to confrontation, extraocular muscles intact, no nystagmus  5th - facial sensation symmetric  7th - facial strength symmetric  8th - hearing intact  9th - palate elevates symmetrically, uvula midline  11th - shoulder shrug symmetric  12th - tongue protrusion midline  SEVERE DYSARTHRIA  MOTOR:   normal bulk  INCREASED TONE ON RIGHT ARM AND RIGHT LEG  DECR RUE AND RLE (4/5)  SLOW MOVEMENTS ON RIGHT SIDE  RIGHT HAND FLEXED  SENSORY:   normal and symmetric to light touch and symm  COORDINATION:   finger-nose-finger, fine finger movements SLOW ON RIGHT SIDE  REFLEXES:   deep tendon reflexes  present; SLIGHTLY BRISK ON RIGHT ARM AND RIGHT KNEE  GAIT/STATION:   narrow based gait; able to walk on toes, heels and tandem; romberg is negative    DIAGNOSTIC DATA (LABS, IMAGING, TESTING) - I reviewed patient records, labs, notes, testing and imaging myself where available.  Lab Results  Component Value Date   WBC 5.9 07/15/2015   HGB 11.1 (L) 07/15/2015   HCT 32.1 (L) 07/15/2015   MCV 85.6 07/15/2015   PLT 296 07/15/2015      Component Value Date/Time   NA 139 07/15/2015 1620   NA 136 11/13/2014 1018   K 4.5 07/15/2015 1620   CL 112 (H) 07/15/2015 1620   CO2 20 (L) 07/15/2015 1620   GLUCOSE 77 07/15/2015 1620   BUN 9 07/15/2015 1620   BUN 10 11/13/2014 1018   CREATININE 0.88 07/15/2015 1620   CALCIUM 9.3 07/15/2015 1620   PROT 6.8 02/27/2015 0959   PROT 7.3 11/13/2014 1018   ALBUMIN 3.7 02/27/2015 0959   ALBUMIN 4.0 11/13/2014 1018   AST 14 (L) 02/27/2015 0959   ALT 7 (L) 02/27/2015 0959   ALKPHOS 61 02/27/2015 0959   BILITOT 0.3 02/27/2015 0959   BILITOT 0.3 11/13/2014 1018    GFRNONAA >60 07/15/2015 1620   GFRAA >60 07/15/2015 1620   Lab Results  Component Value Date   CHOL 213 (H) 10/14/2014   HDL 70 10/14/2014   LDLCALC 132 (H) 10/14/2014   TRIG 57 10/14/2014   CHOLHDL 3.0 10/14/2014   Lab Results  Component Value Date   HGBA1C <4.0 (L) 10/26/2014   Lab Results  Component Value Date   VITAMINB12 1,459 (H) 10/26/2014   Lab Results  Component Value Date   TSH 0.742 10/26/2014    07/15/15 CT head [I reviewed images myself and agree with interpretation. -VRP]  1. No evidence of acute intracranial abnormality. 2. Chronic left MCA infarct. 3. No acute osseous abnormality identified in the cervical spine. 4. Mild platybasia, mild basilar invagination, and mild cervical spondylosis.  03/02/15 EEG - This awake EEG is abnormal due to focal slowing over the left temporal region.     ASSESSMENT AND PLAN  61 y.o. year old female here with history of chronic left MCA ischemic infarction, subsequent expressive aphasia and dysarthria with right hemiparesis, and seizure disorder. Apparently also has history of chronic alcohol abuse. Could consider reducing some anti-seizure meds, but since patient is stable and doing well now, would not make any changes at this time.   Dx:  1. Chronic ischemic left MCA stroke   2. Expressive aphasia   3. Seizure disorder ([place])      PLAN: I spent 15 minutes of face to face time with patient. Greater than 50% of time was spent in counseling and coordination of care with patient. In summary we discussed:  - continue current meds (levetiracetam, vimpat and dilantin) - annual labs (CBC, CMP) per PCP - may follow up as needed  Return if symptoms worsen or fail to improve, for return to PCP.   Penni Bombard, MD 3/57/0177, 93:90 PM Certified in Neurology, Neurophysiology and Neuroimaging  [facility] [address], [place], [place] [address] [phone]

## 2016-04-03 ENCOUNTER — Other Ambulatory Visit: Payer: Self-pay | Admitting: Internal Medicine

## 2016-04-30 ENCOUNTER — Emergency Department (HOSPITAL_COMMUNITY): Payer: Medicare Other

## 2016-04-30 ENCOUNTER — Encounter (HOSPITAL_COMMUNITY): Payer: Self-pay

## 2016-04-30 ENCOUNTER — Emergency Department (HOSPITAL_COMMUNITY)
Admission: EM | Admit: 2016-04-30 | Discharge: 2016-04-30 | Disposition: A | Discharge status: Home / Self Care | Payer: Medicare Other | Attending: Emergency Medicine | Admitting: Emergency Medicine

## 2016-04-30 DIAGNOSIS — G40909 Epilepsy, unspecified, not intractable, without status epilepticus: Secondary | ICD-10-CM | POA: Insufficient documentation

## 2016-04-30 DIAGNOSIS — R569 Unspecified convulsions: Secondary | ICD-10-CM

## 2016-04-30 DIAGNOSIS — Z7982 Long term (current) use of aspirin: Secondary | ICD-10-CM | POA: Insufficient documentation

## 2016-04-30 DIAGNOSIS — F17210 Nicotine dependence, cigarettes, uncomplicated: Secondary | ICD-10-CM | POA: Diagnosis not present

## 2016-04-30 DIAGNOSIS — R4182 Altered mental status, unspecified: Secondary | ICD-10-CM | POA: Diagnosis present

## 2016-04-30 DIAGNOSIS — Z8673 Personal history of transient ischemic attack (TIA), and cerebral infarction without residual deficits: Secondary | ICD-10-CM | POA: Diagnosis not present

## 2016-04-30 DIAGNOSIS — I10 Essential (primary) hypertension: Secondary | ICD-10-CM | POA: Diagnosis not present

## 2016-04-30 DIAGNOSIS — R791 Abnormal coagulation profile: Secondary | ICD-10-CM | POA: Insufficient documentation

## 2016-04-30 DIAGNOSIS — Z79899 Other long term (current) drug therapy: Secondary | ICD-10-CM | POA: Diagnosis not present

## 2016-04-30 LAB — COMPREHENSIVE METABOLIC PANEL
ALT: 22 U/L (ref 14–54)
ANION GAP: 5 (ref 5–15)
AST: 28 U/L (ref 15–41)
Albumin: 3.7 g/dL (ref 3.5–5.0)
Alkaline Phosphatase: 185 U/L — ABNORMAL HIGH (ref 38–126)
BILIRUBIN TOTAL: 0.4 mg/dL (ref 0.3–1.2)
BUN: 11 mg/dL (ref 6–20)
CHLORIDE: 106 mmol/L (ref 101–111)
CO2: 25 mmol/L (ref 22–32)
Calcium: 8.8 mg/dL — ABNORMAL LOW (ref 8.9–10.3)
Creatinine, Ser: 0.81 mg/dL (ref 0.44–1.00)
GFR calc Af Amer: >60 mL/min (ref >60)
Glucose, Bld: 84 mg/dL (ref 65–99)
POTASSIUM: 4.5 mmol/L (ref 3.5–5.1)
Sodium: 136 mmol/L (ref 135–145)
TOTAL PROTEIN: 7.8 g/dL (ref 6.5–8.1)

## 2016-04-30 LAB — CBC
HEMATOCRIT: 32.3 % — AB (ref 36.0–46.0)
HEMOGLOBIN: 11 g/dL — AB (ref 12.0–15.0)
MCH: 28.6 pg (ref 26.0–34.0)
MCHC: 34.1 g/dL (ref 30.0–36.0)
MCV: 84.1 fL (ref 78.0–100.0)
Platelets: 363 10*3/uL (ref 150–400)
RBC: 3.84 MIL/uL — ABNORMAL LOW (ref 3.87–5.11)
RDW: 12.3 % (ref 11.5–15.5)
WBC: 6 10*3/uL (ref 4.0–10.5)

## 2016-04-30 LAB — DIFFERENTIAL
BASOS ABS: 0 10*3/uL (ref 0.0–0.1)
BASOS PCT: 0 %
EOS ABS: 0.1 10*3/uL (ref 0.0–0.7)
Eosinophils Relative: 2 %
LYMPHS ABS: 2 10*3/uL (ref 0.7–4.0)
Lymphocytes Relative: 33 %
MONO ABS: 0.5 10*3/uL (ref 0.1–1.0)
MONOS PCT: 8 %
Neutro Abs: 3.4 10*3/uL (ref 1.7–7.7)
Neutrophils Relative %: 57 %

## 2016-04-30 LAB — PROTIME-INR
INR: 1.15
PROTHROMBIN TIME: 14.7 s (ref 11.4–15.2)

## 2016-04-30 LAB — I-STAT CHEM 8, ED
BUN: 14 mg/dL (ref 6–20)
CALCIUM ION: 1.13 mmol/L — AB (ref 1.15–1.40)
CREATININE: 0.7 mg/dL (ref 0.44–1.00)
Chloride: 107 mmol/L (ref 101–111)
GLUCOSE: 79 mg/dL (ref 65–99)
HCT: 34 % — ABNORMAL LOW (ref 36.0–46.0)
HEMOGLOBIN: 11.6 g/dL — AB (ref 12.0–15.0)
Potassium: 4.5 mmol/L (ref 3.5–5.1)
Sodium: 140 mmol/L (ref 135–145)
TCO2: 25 mmol/L (ref 0–100)

## 2016-04-30 LAB — URINALYSIS, ROUTINE W REFLEX MICROSCOPIC
BILIRUBIN URINE: NEGATIVE
GLUCOSE, UA: NEGATIVE mg/dL
Hgb urine dipstick: NEGATIVE
KETONES UR: NEGATIVE mg/dL
LEUKOCYTES UA: NEGATIVE
NITRITE: NEGATIVE
PH: 5 (ref 5.0–8.0)
PROTEIN: NEGATIVE mg/dL
Specific Gravity, Urine: 1.01 (ref 1.005–1.030)

## 2016-04-30 LAB — RAPID URINE DRUG SCREEN, HOSP PERFORMED
AMPHETAMINES: NOT DETECTED
BARBITURATES: NOT DETECTED
Benzodiazepines: NOT DETECTED
COCAINE: NOT DETECTED
OPIATES: NOT DETECTED
TETRAHYDROCANNABINOL: NOT DETECTED

## 2016-04-30 LAB — I-STAT TROPONIN, ED: Troponin i, poc: 0 ng/mL (ref 0.00–0.08)

## 2016-04-30 LAB — APTT: aPTT: 33 seconds (ref 24–36)

## 2016-04-30 LAB — ETHANOL: Alcohol, Ethyl (B): <5 mg/dL (ref <5)

## 2016-04-30 MED ORDER — SODIUM CHLORIDE 0.9 % IV SOLN
1000.0000 mg | Freq: Once | INTRAVENOUS | Status: AC
  Administered 2016-04-30: 1000 mg via INTRAVENOUS
  Filled 2016-04-30: qty 10

## 2016-04-30 NOTE — ED Provider Notes (Signed)
[facility] DEPT Provider Note   CSN: [id number] Arrival date & time:        History   Chief Complaint Chief Complaint  Patient presents with  . Altered Mental Status    HPI Tina Sanders is a 61 y.o. female.  HPI   Patient is a 61 year old female with history of alcohol abuse, hypertension, seizures, stroke with residual right-sided deficits and expressive aphasia who presents to the ED via EMS from [facility] with altered mental status. EMS reports patient was found by nursing staff with altered mental status prior to arrival. Nursing staff reports patient is typically verbal and ambulatory at baseline. Nursing facility reports patient was last seen normal at 8:30 AM.  I spoke with nurse at nursing facility who reports pt that she found the pt around 9am holding onto hand rails in the hall "balled up". Nurse and CNA then assisted pt to ambulate from hall to her bed. Pt reported having back, CP and left arm pain. Nurse reports pt received her morning meds. Nurse reports at baseline pt can ambulation, slurred/slowed speech and generalized right sided weakness at baseline s/p stroke.  LEVEL V CAVEAT - AMS  Past Medical History:  Diagnosis Date  . Alcohol abuse   . B12 deficiency   . GERD (gastroesophageal reflux disease)   . Hypertension   . IBS (irritable bowel syndrome)   . Seizures ([place])   . 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 Stroke [facility])    residual right sided deficits, expressive dysphasia  . Vitamin D deficiency disease   . Vitamin E deficiency     Patient Active Problem List   Diagnosis Date Noted  . Altered mental status   . Dilantin overdose   . Rib pain on left side   . Dilantin toxicity 02/27/2015  . Seizure disorder ([place]) 02/27/2015  . 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 Focal and partial seizures ([place]) 10/31/2014  . 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 Seizure ([place])   . Aphasia   . Hepatitis C antibody test positive 10/27/2014  .  Malnutrition of moderate degree 10/27/2014  . Hyperlipidemia 10/15/2014  . Essential hypertension 10/14/2014  . GERD (gastroesophageal reflux disease) 10/14/2014  . Generalized seizures ([place]) 10/14/2014  . Vitamin D deficiency 10/14/2014  . Vitamin B12 deficiency 10/14/2014  . Asthma 10/14/2014  . CVA (cerebral vascular accident) ([place]) 10/14/2014    History reviewed. No pertinent surgical history.  OB History    No data available       Home Medications    Prior to Admission medications   Medication Sig Start Date End Date Taking? Authorizing Provider  aspirin 81 MG chewable tablet Chew 1 tablet (81 mg total) by mouth daily. 12/22/14  Yes Jule Ser, DO  atorvastatin (LIPITOR) 40 MG tablet TAKE 1 TABLET BY MOUTH DAILY 04/21/15  Yes Jule Ser, DO  cholecalciferol (VITAMIN D) 1000 units tablet Take 1,000 Units by mouth daily.   Yes Historical Provider, MD  Lacosamide (VIMPAT) 150 MG TABS Take 150 mg by mouth 2 (two) times daily.   Yes Historical Provider, MD  levETIRAcetam (KEPPRA) 500 MG tablet Take 500 mg by mouth 2 (two) times daily.   Yes Historical Provider, MD  Linaclotide Rolan Lipa) 145 MCG CAPS capsule Take 1 capsule (145 mcg total) by mouth daily. 12/22/14  Yes Jule Ser, DO  lisinopril-hydrochlorothiazide (PRINZIDE,ZESTORETIC) 10-12.5 MG tablet Take by mouth. 12/22/14  Yes Historical Provider, MD  mirtazapine (REMERON) 7.5 MG tablet Take 7.5 mg by  mouth at bedtime.   Yes Historical Provider, MD  Multiple Vitamin (MULTIVITAMIN WITH MINERALS) TABS tablet Take 1 tablet by mouth daily. 10/31/14  Yes Iline Oven, MD  omeprazole (PRILOSEC) 20 MG capsule Take 20 mg by mouth daily. 12/22/14  Yes Historical Provider, MD  phenytoin (DILANTIN) 125 MG/5ML suspension Take 100 mg by mouth every 8 (eight) hours.   Yes Historical Provider, MD  rivastigmine (EXELON) 1.5 MG capsule Take 1.5 mg by mouth 2 (two) times daily.   Yes Historical Provider, MD  acetaminophen  (TYLENOL) 325 MG tablet Take 650 mg by mouth every 6 (six) hours as needed.    Historical Provider, MD  albuterol (PROVENTIL) (5 MG/ML) 0.5% nebulizer solution Take 0.5 mLs (2.5 mg total) by nebulization every 6 (six) hours as needed for wheezing or shortness of breath. Patient not taking: Reported on 03/31/2016 10/14/14   Jule Ser, DO  levETIRAcetam (KEPPRA) 750 MG tablet Take 2 tablets (1,500 mg total) by mouth 2 (two) times daily. Patient not taking: Reported on 04/30/2016 03/01/15   Florinda Marker, MD  lisinopril-hydrochlorothiazide (ZESTORETIC) 10-12.5 MG tablet Take 1 tablet by mouth daily. 12/22/14 12/21/15  Jule Ser, DO  omeprazole (PRILOSEC) 20 MG capsule Take 1 capsule (20 mg total) by mouth daily. 12/22/14 12/21/15  Jule Ser, DO    Family History Family History  Problem Relation Age of Onset  . Hypertension Mother   . Hypertension Father   . Kidney disease Father   . Hyperlipidemia Father   . Hypertension Sister   . Diabetes Sister     Social History Social History  Substance Use Topics  . Smoking status: Current Every Day Smoker    Packs/day: 0.25    Years: 40.00    Types: Cigarettes  . Smokeless tobacco: Never Used  . Alcohol use No     Comment: Reports not drinking now.     Allergies   Penicillins   Review of Systems Review of Systems  Unable to perform ROS: Mental status change     Physical Exam Updated Vital Signs BP 109/70   Pulse 74   Temp 97.3 F (36.3 C) (Oral)   Resp (!) 21   Ht 5' (1.524 m)   Wt 40.8 kg   SpO2 100%   BMI 17.58 kg/m   Physical Exam  Constitutional: She appears well-developed and well-nourished.  Pt nonverbal on exam  HENT:  Head: Normocephalic and atraumatic.  Mouth/Throat: Oropharynx is clear and moist. No oropharyngeal exudate.  Eyes: Conjunctivae and EOM are normal. Right eye exhibits no discharge. Left eye exhibits no discharge. No scleral icterus.  Neck: Normal range of motion. Neck supple.    Cardiovascular: Normal rate, regular rhythm, normal heart sounds and intact distal pulses.   Pulmonary/Chest: Effort normal and breath sounds normal. No respiratory distress. She has no wheezes. She has no rales. She exhibits no tenderness.  Abdominal: Soft. Bowel sounds are normal. She exhibits no distension and no mass. There is tenderness. There is no rebound and no guarding. No hernia.  Pt mildly grimacing with palpation of abdomen diffusely.   Musculoskeletal: She exhibits no edema.  Neurological: GCS eye subscore is 4. GCS verbal subscore is 2. GCS motor subscore is 4.  Pt alert with eyes open, intermittently making eye contact to verbal response. Pt not following commands. Pt laying in bed moving her head, looking around the room. Spontaneously moving hands, arms and legs but will not follow commands. Neuro exam limited due to AMS and pt  remaining nonverbal but reported to yell when pt was being undressed and placed in a gown.  Skin: Skin is warm and dry.  Nursing note and vitals reviewed.    ED Treatments / Results  Labs (all labs ordered are listed, but only abnormal results are displayed) Labs Reviewed  COMPREHENSIVE METABOLIC PANEL - Abnormal; Notable for the following:       Result Value   Calcium 8.8 (*)    Alkaline Phosphatase 185 (*)    All other components within normal limits  CBC - Abnormal; Notable for the following:    RBC 3.84 (*)    Hemoglobin 11.0 (*)    HCT 32.3 (*)    All other components within normal limits  URINALYSIS, ROUTINE W REFLEX MICROSCOPIC - Abnormal; Notable for the following:    APPearance HAZY (*)    All other components within normal limits  I-STAT CHEM 8, ED - Abnormal; Notable for the following:    Calcium, Ion 1.13 (*)    Hemoglobin 11.6 (*)    HCT 34.0 (*)    All other components within normal limits  PROTIME-INR  ETHANOL  APTT  DIFFERENTIAL  RAPID URINE DRUG SCREEN, HOSP PERFORMED  I-STAT TROPOININ, ED    EKG  EKG  Interpretation  Date/Time:  Sunday April 30 2016 11:08:28 EDT Ventricular Rate:  76 PR Interval:    QRS Duration: 93 QT Interval:  376 QTC Calculation: 423 R Axis:   49 Text Interpretation:  Sinus rhythm Low voltage, extremity leads ST-t wave abnormality No significant change since last tracing Abnormal ekg Confirmed by Carmin Muskrat  MD [id number]) on 04/30/2016 11:17:55 AM       Radiology Dg Thoracic Spine 2 View  Result Date: 04/30/2016 CLINICAL DATA:  Thoracic spine pain. No known injury. Initial encounter. EXAM: THORACIC SPINE 2 VIEWS COMPARISON:  PA and lateral chest 05/27/2015. FINDINGS: There is no acute fracture. Remote, mild compression fracture of T8 is unchanged. Alignment is maintained. IMPRESSION: No acute abnormality. No change in a mild T8 compression fracture since the prior exam. Electronically Signed   By: Inge Rise M.D.   On: 04/30/2016 16:09   Dg Lumbar Spine Complete  Result Date: 04/30/2016 CLINICAL DATA:  Low back pain.  No known injury.  Initial encounter. EXAM: LUMBAR SPINE - COMPLETE 4+ VIEW COMPARISON:  Plain films lumbar spine 11/09/2014. FINDINGS: Vertebral body height and alignment are maintained. Intervertebral disc space height is normal with mild anterior endplate spurring at L3 and L4 unchanged. Mild facet degenerative change L4-5 and L5-S1 noted. IMPRESSION: No acute abnormality. Mild degenerative disease. Electronically Signed   By: Inge Rise M.D.   On: 04/30/2016 16:11   Ct Head Wo Contrast  Result Date: 04/30/2016 CLINICAL DATA:  Patient is non-verbal and increased lethargy, Per family "she is normally up walking around and communicating with staff and family members" EXAM: CT HEAD WITHOUT CONTRAST TECHNIQUE: Contiguous axial images were obtained from the base of the skull through the vertex without intravenous contrast. COMPARISON:  07/15/2015 FINDINGS: Brain: No parenchymal masses or mass effect. There is no evidence of a recent infarct.  There are no extra-axial masses or abnormal fluid collections. No intracranial hemorrhage. There is mild ex vacuo dilation of the left lateral ventricle due to an old left MCA distribution infarct, stable. Ventricles are otherwise normal in size. No hydrocephalus. Vascular: No hyperdense vessel or unexpected calcification. Skull: Normal. Negative for fracture or focal lesion. Sinuses/Orbits: Globes and orbits are unremarkable. Visualized sinuses and mastoid  air cells are clear. Other: None. IMPRESSION: 1. No acute intracranial abnormalities. 2. Stable appearance of the old left MCA distribution infarct. Electronically Signed   By: Lajean Manes M.D.   On: 04/30/2016 12:40    Procedures Procedures (including critical care time)  Medications Ordered in ED Medications  levETIRAcetam (KEPPRA) 1,000 mg in sodium chloride 0.9 % 100 mL IVPB (1,000 mg Intravenous New Bag/Given 04/30/16 1614)     Initial Impression / Assessment and Plan / ED Course  I have reviewed the triage vital signs and the nursing notes.  Pertinent labs & imaging results that were available during my care of the patient were reviewed by me and considered in my medical decision making (see chart for details).    Patient presents from nursing facility with reported altered mental status. PMH of alcohol abuse, hypertension, seizures, stroke with residual right-sided deficits and expressive aphasia. Nursing facility denies any witnessed seizures. Denies any known recent falls. VSS. On initial exam patient is laying in bed alert,  intermittently making eye contact to verbal response. Pt not following commands. Pt laying in bed moving her head, looking around the room. Spontaneously moving hands, arms and legs but will not follow commands.   Labs unremarkable. UA negative. UDS negative. CT head negative. EKG showed sinus rhythm with no significant changes from prior. On reevaluation, patient's family at bedside. During reevaluation patient  is alert and oriented to person and place only which he reports is baseline. Patient with slowed speech consistent with baseline. GCS 15. No neuro deficits. Bilateral upper and lower extremities neurovascularly intact with 5 out of 5 strength. Patient reports having diffuse back pain, diffusely tender to palpation. Plan to order thoracic and lumbar x-rays for further evaluation. Discussed pt with Dr. Vanita Panda who also evaluated pt in the ED. Xrays negative. Patient's symptoms appear to be consistent with postictal behavior. Do not suspect CVA/TIA warranting further workup or imaging. Plan to load patient with Keppra in the ED and discharge home with PCP and neuro follow-up. Discussed return precautions.   Final Clinical Impressions(s) / ED Diagnoses   Final diagnoses:  Seizure [facility])    New Prescriptions New Prescriptions   No medications on file     Nona Dell, [place] 04/30/16 [place], MD 05/01/16 [phone]

## 2016-04-30 NOTE — ED Notes (Signed)
pts sister and BIL at bedside.  Reports pt walks, understands conversation, expressive aphasia, eats soft foods, thickened liquids.  PA updated.

## 2016-04-30 NOTE — ED Notes (Signed)
Attempted report x 5, no answer each time. Discharge instructions to be sent with PTAR.

## 2016-04-30 NOTE — ED Notes (Signed)
Waiting on PTAR.  Pt moved to hallway, pt refused to stay in bed, ambulated to hall, sat in chair, sister and BIL at bedside.  Pt wanting to go outside to smoke, pt instructed that she is not able to leave building.  Pt verbalizes unhappiness with that plan.

## 2016-04-30 NOTE — ED Notes (Signed)
Pt responds to question with name and some response to command.

## 2016-04-30 NOTE — Discharge Instructions (Signed)
Continue taking her home medications including years seizure medicine as prescribed. I recommend following up with her primary care provider within the next 2-3 days for follow-up evaluation. I also recommend following up with neurology within the next week for follow-up evaluation regarding your seizure. Please return to the Emergency Department if symptoms worsen or new onset of fever, headache, confusion, altered mental status, weakness, chest pain, difficulty breathing, abdominal pain, vomiting, numbness, weakness.

## 2016-04-30 NOTE — ED Triage Notes (Addendum)
Pt arrives [facility] from [facility] where she was noted as being decreased LOC Normal is verbal and ambulatory. Pt is non verbal and does not move extremities. Tracks with eyes.Last seen normal 0830

## 2016-05-04 ENCOUNTER — Emergency Department (HOSPITAL_COMMUNITY)
Admission: EM | Admit: 2016-05-04 | Discharge: 2016-05-04 | Disposition: A | Discharge status: Home / Self Care | Payer: Medicare Other | Attending: Emergency Medicine | Admitting: Emergency Medicine

## 2016-05-04 ENCOUNTER — Encounter (HOSPITAL_COMMUNITY): Payer: Self-pay | Admitting: Emergency Medicine

## 2016-05-04 ENCOUNTER — Emergency Department (HOSPITAL_COMMUNITY): Payer: Medicare Other

## 2016-05-04 DIAGNOSIS — F17210 Nicotine dependence, cigarettes, uncomplicated: Secondary | ICD-10-CM | POA: Insufficient documentation

## 2016-05-04 DIAGNOSIS — R410 Disorientation, unspecified: Secondary | ICD-10-CM | POA: Diagnosis not present

## 2016-05-04 DIAGNOSIS — Z7982 Long term (current) use of aspirin: Secondary | ICD-10-CM | POA: Insufficient documentation

## 2016-05-04 DIAGNOSIS — R4182 Altered mental status, unspecified: Secondary | ICD-10-CM | POA: Diagnosis present

## 2016-05-04 DIAGNOSIS — I10 Essential (primary) hypertension: Secondary | ICD-10-CM | POA: Insufficient documentation

## 2016-05-04 DIAGNOSIS — Z8673 Personal history of transient ischemic attack (TIA), and cerebral infarction without residual deficits: Secondary | ICD-10-CM | POA: Insufficient documentation

## 2016-05-04 DIAGNOSIS — Z79899 Other long term (current) drug therapy: Secondary | ICD-10-CM | POA: Insufficient documentation

## 2016-05-04 LAB — COMPREHENSIVE METABOLIC PANEL
ALT: 29 U/L (ref 14–54)
AST: 37 U/L (ref 15–41)
Albumin: 3.8 g/dL (ref 3.5–5.0)
Alkaline Phosphatase: 207 U/L — ABNORMAL HIGH (ref 38–126)
Anion gap: 8 (ref 5–15)
BUN: 12 mg/dL (ref 6–20)
CALCIUM: 8.8 mg/dL — AB (ref 8.9–10.3)
CHLORIDE: 110 mmol/L (ref 101–111)
CO2: 23 mmol/L (ref 22–32)
CREATININE: 0.81 mg/dL (ref 0.44–1.00)
Glucose, Bld: 74 mg/dL (ref 65–99)
Potassium: 4.2 mmol/L (ref 3.5–5.1)
Sodium: 141 mmol/L (ref 135–145)
Total Bilirubin: 0.4 mg/dL (ref 0.3–1.2)
Total Protein: 7.6 g/dL (ref 6.5–8.1)

## 2016-05-04 LAB — CBC
HCT: 32.6 % — ABNORMAL LOW (ref 36.0–46.0)
Hemoglobin: 11.1 g/dL — ABNORMAL LOW (ref 12.0–15.0)
MCH: 28.7 pg (ref 26.0–34.0)
MCHC: 34 g/dL (ref 30.0–36.0)
MCV: 84.2 fL (ref 78.0–100.0)
PLATELETS: 306 10*3/uL (ref 150–400)
RBC: 3.87 MIL/uL (ref 3.87–5.11)
RDW: 12.4 % (ref 11.5–15.5)
WBC: 6 10*3/uL (ref 4.0–10.5)

## 2016-05-04 LAB — URINALYSIS, ROUTINE W REFLEX MICROSCOPIC
Bilirubin Urine: NEGATIVE
Glucose, UA: NEGATIVE mg/dL
Hgb urine dipstick: NEGATIVE
KETONES UR: NEGATIVE mg/dL
LEUKOCYTES UA: NEGATIVE
NITRITE: NEGATIVE
PROTEIN: NEGATIVE mg/dL
Specific Gravity, Urine: 1.01 (ref 1.005–1.030)
pH: 5 (ref 5.0–8.0)

## 2016-05-04 LAB — PHENYTOIN LEVEL, TOTAL: PHENYTOIN LVL: 35.7 ug/mL — AB (ref 10.0–20.0)

## 2016-05-04 LAB — CBG MONITORING, ED: GLUCOSE-CAPILLARY: 69 mg/dL (ref 65–99)

## 2016-05-04 MED ORDER — LEVETIRACETAM 750 MG PO TABS: 750.0000 mg | ORAL_TABLET | Freq: Two times a day (BID) | ORAL | 0 refills | Status: DC

## 2016-05-04 MED ORDER — SODIUM CHLORIDE 0.9 % IV BOLUS (SEPSIS)
1000.0000 mL | Freq: Once | INTRAVENOUS | Status: AC
  Administered 2016-05-04: 1000 mL via INTRAVENOUS

## 2016-05-04 MED ORDER — PHENYTOIN 125 MG/5ML PO SUSP: 100.0000 mg | Freq: Two times a day (BID) | ORAL | 0 refills | Status: DC

## 2016-05-04 NOTE — ED Notes (Signed)
Family states pt's ride is here.  Transported pt to Ed waiting area via wheelchair.

## 2016-05-04 NOTE — ED Notes (Signed)
CBG: 69 RN notified

## 2016-05-04 NOTE — ED Notes (Signed)
Pt informed it would take [facility] about 5 hours to take patient home. Pts daughter states that her husband will come pick up patient from ED and take her home.

## 2016-05-04 NOTE — ED Notes (Signed)
Pt sitting up on side of the bed, drinking water without difficulty, awaiting ride home.

## 2016-05-04 NOTE — ED Provider Notes (Signed)
[facility] DEPT Provider Note   CSN: [id number] Arrival date & time: 05/04/16  1149     History   Chief Complaint Chief Complaint  Patient presents with  . Altered Mental Status    HPI Tina Sanders is a 61 y.o. female.  HPI Patient presents from her nursing facility after an episode of altered mental status. Patient herself is spitting spontaneously, though not consistently, not answering questions appropriately, and has history of seizure, stroke. Level V caveat secondary to verbal status, altered mental status. Past Medical History:  Diagnosis Date  . Alcohol abuse   . B12 deficiency   . GERD (gastroesophageal reflux disease)   . Hypertension   . IBS (irritable bowel syndrome)   . Seizures ([place])   . 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 Stroke [facility])    residual right sided deficits, expressive dysphasia  . Vitamin D deficiency disease   . Vitamin E deficiency     Patient Active Problem List   Diagnosis Date Noted  . Altered mental status   . Dilantin overdose   . Rib pain on left side   . Dilantin toxicity 02/27/2015  . Seizure disorder ([place]) 02/27/2015  . 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 Focal and partial seizures ([place]) 10/31/2014  . 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 Seizure ([place])   . Aphasia   . Hepatitis C antibody test positive 10/27/2014  . Malnutrition of moderate degree 10/27/2014  . Hyperlipidemia 10/15/2014  . Essential hypertension 10/14/2014  . GERD (gastroesophageal reflux disease) 10/14/2014  . Generalized seizures ([place]) 10/14/2014  . Vitamin D deficiency 10/14/2014  . Vitamin B12 deficiency 10/14/2014  . Asthma 10/14/2014  . CVA (cerebral vascular accident) ([place]) 10/14/2014    History reviewed. No pertinent surgical history.  OB History    No data available       Home Medications    Prior to Admission medications   Medication Sig Start Date End Date Taking? Authorizing Provider  acetaminophen (TYLENOL) 325  MG tablet Take 650 mg by mouth every 6 (six) hours as needed.   Yes Historical Provider, MD  aspirin 81 MG chewable tablet Chew 1 tablet (81 mg total) by mouth daily. 12/22/14  Yes Jule Ser, DO  atorvastatin (LIPITOR) 40 MG tablet TAKE 1 TABLET BY MOUTH DAILY 04/21/15  Yes Jule Ser, DO  cholecalciferol (VITAMIN D) 1000 units tablet Take 1,000 Units by mouth daily.   Yes Historical Provider, MD  Lacosamide (VIMPAT) 150 MG TABS Take 150 mg by mouth 2 (two) times daily.   Yes Historical Provider, MD  levETIRAcetam (KEPPRA) 500 MG tablet Take 500 mg by mouth 2 (two) times daily.   Yes Historical Provider, MD  Linaclotide Rolan Lipa) 145 MCG CAPS capsule Take 1 capsule (145 mcg total) by mouth daily. 12/22/14  Yes Jule Ser, DO  lisinopril-hydrochlorothiazide (PRINZIDE,ZESTORETIC) 10-12.5 MG tablet Take by mouth. 12/22/14  Yes Historical Provider, MD  mirtazapine (REMERON) 7.5 MG tablet Take 7.5 mg by mouth at bedtime.   Yes Historical Provider, MD  Multiple Vitamin (MULTIVITAMIN WITH MINERALS) TABS tablet Take 1 tablet by mouth daily. 10/31/14  Yes Iline Oven, MD  omeprazole (PRILOSEC) 20 MG capsule Take 20 mg by mouth daily. 12/22/14  Yes Historical Provider, MD  phenytoin (DILANTIN) 125 MG/5ML suspension Take 100 mg by mouth every 8 (eight) hours.   Yes Historical Provider, MD  rivastigmine (EXELON) 1.5 MG capsule Take 1.5 mg by mouth 2 (two) times daily.   Yes Historical Provider, MD  albuterol (PROVENTIL) (5 MG/ML) 0.5% nebulizer solution Take 0.5 mLs (2.5 mg total) by nebulization every 6 (six) hours as needed for wheezing or shortness of breath. Patient not taking: Reported on 03/31/2016 10/14/14   Jule Ser, DO  levETIRAcetam (KEPPRA) 750 MG tablet Take 2 tablets (1,500 mg total) by mouth 2 (two) times daily. Patient not taking: Reported on 04/30/2016 03/01/15   Florinda Marker, MD  lisinopril-hydrochlorothiazide (ZESTORETIC) 10-12.5 MG tablet Take 1 tablet by mouth daily.  12/22/14 12/21/15  Jule Ser, DO  omeprazole (PRILOSEC) 20 MG capsule Take 1 capsule (20 mg total) by mouth daily. 12/22/14 12/21/15  Jule Ser, DO    Family History Family History  Problem Relation Age of Onset  . Hypertension Mother   . Hypertension Father   . Kidney disease Father   . Hyperlipidemia Father   . Hypertension Sister   . Diabetes Sister     Social History Social History  Substance Use Topics  . Smoking status: Current Every Day Smoker    Packs/day: 0.25    Years: 40.00    Types: Cigarettes  . Smokeless tobacco: Never Used  . Alcohol use No     Comment: Reports not drinking now.     Allergies   Penicillins   Review of Systems Review of Systems  Unable to perform ROS: Mental status change     Physical Exam Updated Vital Signs BP 114/65 (BP Location: Right Arm)   Pulse 77   Temp 97.9 F (36.6 C) (Oral)   Resp 12   SpO2 100%   Physical Exam  Constitutional:  Sickly, frail-appearing elderly female  HENT:  Head: Normocephalic and atraumatic.  Eyes: Conjunctivae and EOM are normal. Right eye exhibits no discharge. Left eye exhibits no discharge. No scleral icterus.  Neck: Normal range of motion. Neck supple.  Cardiovascular: Normal rate, regular rhythm and intact distal pulses.   Pulmonary/Chest: Effort normal. No respiratory distress.  Abdominal: Soft. She exhibits no distension.  Pt mildly grimacing with palpation of abdomen diffusely.   Musculoskeletal: She exhibits no edema.  Neurological: She displays atrophy. GCS eye subscore is 4. GCS verbal subscore is 2. GCS motor subscore is 4.  Pt alert with eyes open, intermittently making eye contact to verbal response. Pt not following commands consistently, but does speak briefly, seemingly complaining about electrodes on her left chest. Diffuse atrophy, but the patient moves all extremity spontaneously.  Skin: Skin is warm and dry.  Psychiatric: Cognition and memory are impaired.    Nursing note and vitals reviewed.    ED Treatments / Results  Labs (all labs ordered are listed, but only abnormal results are displayed) Labs Reviewed  COMPREHENSIVE METABOLIC PANEL - Abnormal; Notable for the following:       Result Value   Calcium 8.8 (*)    Alkaline Phosphatase 207 (*)    All other components within normal limits  CBC - Abnormal; Notable for the following:    Hemoglobin 11.1 (*)    HCT 32.6 (*)    All other components within normal limits  URINALYSIS, ROUTINE W REFLEX MICROSCOPIC - Abnormal; Notable for the following:    APPearance HAZY (*)    All other components within normal limits  PHENYTOIN LEVEL, TOTAL - Abnormal; Notable for the following:    Phenytoin Lvl 35.7 (*)    All other components within normal limits  CBG MONITORING, ED    EKG  EKG Interpretation  Date/Time:  Thursday May 04 2016 11:50:35 EDT Ventricular Rate:  76 PR Interval:    QRS Duration: 89 QT Interval:  376 QTC Calculation: 423 R Axis:   42 Text Interpretation:  Sinus rhythm Borderline low voltage, extremity leads ST-t wave abnormality Abnormal ekg Confirmed by Carmin Muskrat  MD [phone]) on 05/04/2016 12:24:47 PM       Radiology Dg Chest Port 1 View  Result Date: 05/04/2016 CLINICAL DATA:  Heard mental status. EXAM: PORTABLE CHEST 1 VIEW COMPARISON:  05/27/2015 FINDINGS: Bibasilar atelectasis. Heart is upper limits normal in size. No effusions or acute bony abnormality. IMPRESSION: Bibasilar atelectasis. Electronically Signed   By: Rolm Baptise M.D.   On: 05/04/2016 12:32    Procedures Procedures (including critical care time)  Medications Ordered in ED Medications  sodium chloride 0.9 % bolus 1,000 mL (not administered)     Initial Impression / Assessment and Plan / ED Course  I have reviewed the triage vital signs and the nursing notes.  Pertinent labs & imaging results that were available during my care of the patient were reviewed by me and considered in my  medical decision making (see chart for details).  I evaluated this patient almost 2 weeks ago with a physician assistant, patient was presenting after similar episode, found to have likely seizure activity.  2:40 PM Vision substantially more awake and alert. Speech is still slightly difficult to understand, but the patient states that she does not understand how she could have had a seizure, as she takes all medication. Patient is now accompanied by a family member. Given the reported compliance with Keppra, Vimpat, Dilantin, with persistent seizure activity, patient will be discussed with our neurologist.  Now, patient is even more awake than before. I discussed her medications with family. We agreed to implement changes advised by neurology. With no other new complaints, no evidence for ongoing seizure activity, patient will be followed with neurology as an outpatient as well.    Final Clinical Impressions(s) / ED Diagnoses  Seizure   Carmin Muskrat, MD 05/04/16 1549

## 2016-05-04 NOTE — ED Triage Notes (Signed)
Pt brought to ED via [facility] from [facility] memory care.   Per EMS pt had a change in neuro status.  EMS reports pt is nonverbal at baseline.  Pt has a hx of seizures and stroke. Facility reports they are unaware of last known normal.

## 2016-05-04 NOTE — Discharge Instructions (Signed)
As discussed, your evaluation today has been largely reassuring.  But, it is important that you monitor your condition carefully, and do not hesitate to return to the ED if you develop new, or concerning changes in your condition.  Tonight, please hold all Dilantin.  [place], please use the new regimen of antiseizure medication.

## 2016-05-04 NOTE — ED Notes (Signed)
Pt more alert. Sitting upright in the bed.  Eyes open spontaneously. Pt interacting with family at baseline.  Pt mumbles/grunts with speech, almost incoherent by this RN although family is able to understand pt.

## 2016-05-19 ENCOUNTER — Encounter: Payer: Self-pay | Admitting: Diagnostic Neuroimaging

## 2016-05-19 ENCOUNTER — Ambulatory Visit (INDEPENDENT_AMBULATORY_CARE_PROVIDER_SITE_OTHER): Payer: Medicare Other | Admitting: Diagnostic Neuroimaging

## 2016-05-19 ENCOUNTER — Encounter (INDEPENDENT_AMBULATORY_CARE_PROVIDER_SITE_OTHER): Payer: Self-pay

## 2016-05-19 VITALS — BP 86/55 | HR 83 | Wt 90.4 lb

## 2016-05-19 DIAGNOSIS — R4701 Aphasia: Secondary | ICD-10-CM | POA: Diagnosis not present

## 2016-05-19 DIAGNOSIS — T420X1S Poisoning by hydantoin derivatives, accidental (unintentional), sequela: Secondary | ICD-10-CM

## 2016-05-19 DIAGNOSIS — I6930 Unspecified sequelae of cerebral infarction: Secondary | ICD-10-CM

## 2016-05-19 DIAGNOSIS — G40909 Epilepsy, unspecified, not intractable, without status epilepticus: Secondary | ICD-10-CM | POA: Diagnosis not present

## 2016-05-19 NOTE — Progress Notes (Signed)
[facility]  PATIENT: Tina Sanders DOB: October 29, 1955  REFERRING CLINICIAN: Lesia Hausen, PA-c HISTORY FROM: patient, caregiver, EPIC notes REASON FOR VISIT: follow up   HISTORICAL  CHIEF COMPLAINT:  Chief Complaint  Patient presents with  . Follow-up  . Altered Mental Status    HISTORY OF PRESENT ILLNESS:   UPDATE 05/19/16: Since last visit, patient went to ER x 2 in April 2018, for confusion spells and possible seizures, on 04/30/16 and 05/04/16. The 2nd ER visit, dilantin level was toxic at 35.7. Dilantin dosing was reduced. Now patient back to baseline.   UPDATE 03/31/16: Since last visit, no seizures. Tolerating meds. No new neuro issues.   PRIOR HPI (10/01/15): 61 year old female here for evaluation of seizure disorder. Patient arrives with transport/caregiver from her assisted living facility. I reviewed referring provider notes, Emergency room notes, imaging and lab results. Summary patient has history of left frontal ischemic infarction many years ago (date not exactly known) with subsequent seizure disorder. Apparently she has history of chronic alcohol abuse as well. Unfortunately patient, caregiver and facility or not able to provide me any additional information. No one is sure when her seizures for started. Apparently her last seizure was 23 months ago but no one can give me a description of the event. Patient is currently on 3 antiseizure medications including levetiracetam, Vimpat, Dilantin. Patient has a family member who lives in [place] is not available at this office visit today or by phone.   REVIEW OF SYSTEMS: Full 14 system review of systems performed and negative with exception of: headaches light sens seizure disorder trouble swallowing confusion depression.   ALLERGIES: Allergies  Allergen Reactions  . Penicillins Other (See Comments)    abdominal bloating Has patient had a PCN reaction causing immediate rash, facial/tongue/throat  swelling, SOB or lightheadedness with hypotension: No Has patient had a PCN reaction causing severe rash involving mucus membranes or skin necrosis: No Has patient had a PCN reaction that required hospitalization No Has patient had a PCN reaction occurring within the last 10 years: No If all of the above answers are "NO", then may proceed with Cephalosporin use.     HOME MEDICATIONS: Outpatient Medications Prior to Visit  Medication Sig Dispense Refill  . acetaminophen (TYLENOL) 325 MG tablet Take 650 mg by mouth every 6 (six) hours as needed.    Marland Kitchen albuterol (PROVENTIL) (5 MG/ML) 0.5% nebulizer solution Take 0.5 mLs (2.5 mg total) by nebulization every 6 (six) hours as needed for wheezing or shortness of breath. 20 mL 12  . aspirin 81 MG chewable tablet Chew 1 tablet (81 mg total) by mouth daily. 90 tablet 3  . atorvastatin (LIPITOR) 40 MG tablet TAKE 1 TABLET BY MOUTH DAILY 30 tablet 11  . cholecalciferol (VITAMIN D) 1000 units tablet Take 1,000 Units by mouth daily.    . Lacosamide (VIMPAT) 150 MG TABS Take 150 mg by mouth 2 (two) times daily.    Marland Kitchen levETIRAcetam (KEPPRA) 750 MG tablet Take 2 tablets (1,500 mg total) by mouth 2 (two) times daily. 360 tablet 3  . levETIRAcetam (KEPPRA) 750 MG tablet Take 1 tablet (750 mg total) by mouth 2 (two) times daily. 60 tablet 0  . Linaclotide (LINZESS) 145 MCG CAPS capsule Take 1 capsule (145 mcg total) by mouth daily. 90 capsule 3  . lisinopril-hydrochlorothiazide (PRINZIDE,ZESTORETIC) 10-12.5 MG tablet Take by mouth.    . mirtazapine (REMERON) 7.5 MG tablet Take 7.5 mg by mouth at bedtime.    . Multiple Vitamin (  MULTIVITAMIN WITH MINERALS) TABS tablet Take 1 tablet by mouth daily.    Marland Kitchen omeprazole (PRILOSEC) 20 MG capsule Take 20 mg by mouth daily.    . phenytoin (DILANTIN) 125 MG/5ML suspension Take 4 mLs (100 mg total) by mouth 2 (two) times daily. 237 mL 0  . rivastigmine (EXELON) 1.5 MG capsule Take 1.5 mg by mouth 2 (two) times daily.    Marland Kitchen  lisinopril-hydrochlorothiazide (ZESTORETIC) 10-12.5 MG tablet Take 1 tablet by mouth daily. 90 tablet 3  . omeprazole (PRILOSEC) 20 MG capsule Take 1 capsule (20 mg total) by mouth daily. 90 capsule 3   No facility-administered medications prior to visit.     PAST MEDICAL HISTORY: Past Medical History:  Diagnosis Date  . Alcohol abuse   . B12 deficiency   . GERD (gastroesophageal reflux disease)   . Hypertension   . IBS (irritable bowel syndrome)   . Seizures ([place])   . Sickle-cell/Hb-C disease ([place])   . Stroke [facility])    residual right sided deficits, expressive dysphasia  . Vitamin D deficiency disease   . Vitamin E deficiency     PAST SURGICAL HISTORY: No past surgical history on file.  FAMILY HISTORY: Family History  Problem Relation Age of Onset  . Hypertension Mother   . Hypertension Father   . Kidney disease Father   . Hyperlipidemia Father   . Hypertension Sister   . Diabetes Sister     SOCIAL HISTORY:  Social History   Social History  . Marital status: Single    Spouse name: N/A  . Number of children: 0  . Years of education: HS   Occupational History  . Disabled    Social History Main Topics  . Smoking status: Current Every Day Smoker    Packs/day: 0.25    Years: 40.00    Types: Cigarettes  . Smokeless tobacco: Never Used  . Alcohol use No     Comment: Reports not drinking now.  . Drug use: No  . Sexual activity: Not on file   Other Topics Concern  . Not on file   Social History Narrative   Lives at [facility] (Assisted Living).   Right-handed.   No caffeine use.     PHYSICAL EXAM  GENERAL EXAM/CONSTITUTIONAL: Vitals:  Vitals:   05/19/16 1151  BP: (!) 86/55  Pulse: 83  Weight: 90 lb 6.4 oz (41 kg)   Wt Readings from Last 3 Encounters:  05/19/16 90 lb 6.4 oz (41 kg)  04/30/16 90 lb (40.8 kg)  03/31/16 94 lb 12.8 oz (43 kg)   Body mass index is 17.66 kg/m. No exam data present  Patient is in no distress; well developed,  nourished and groomed; neck is supple  CARDIOVASCULAR:  Examination of carotid arteries is normal; no carotid bruits  Regular rate and rhythm, no murmurs  Examination of peripheral vascular system by observation and palpation is normal  POOR DENTITION  THIN, FRAIL APPEARING  EYES:  Ophthalmoscopic exam of optic discs and posterior segments is normal; no papilledema or hemorrhages  MUSCULOSKELETAL:  Gait, strength, tone, movements noted in Neurologic exam below  NEUROLOGIC: MENTAL STATUS:  No flowsheet data found.  EXPRESSIVE APHASIA  awake, alert, oriented to self; NOT PLACE OR TIME; ABLE TO TELL ME HER NAME  DECR memory   DECRE attention and concentration  DECR FLUENCY, DECR COMPREHENSION, DECR NAMING   DECR fund of knowledge  CRANIAL NERVE:   2nd - no papilledema on fundoscopic exam  2nd, 3rd, 4th,  6th - pupils equal and reactive to light, visual fields full to confrontation, extraocular muscles intact, no nystagmus  5th - facial sensation symmetric  7th - facial strength symmetric  8th - hearing intact  9th - palate elevates symmetrically, uvula midline  11th - shoulder shrug symmetric  12th - tongue protrusion midline  SEVERE DYSARTHRIA  MOTOR:   normal bulk  INCREASED TONE ON RIGHT ARM AND RIGHT LEG  DECR RUE AND RLE (4/5)  SLOW MOVEMENTS ON RIGHT SIDE  RIGHT HAND FLEXED  SENSORY:   normal and symmetric to light touch and symm  COORDINATION:   finger-nose-finger, fine finger movements SLOW ON RIGHT SIDE  REFLEXES:   deep tendon reflexes present; SLIGHTLY BRISK ON RIGHT ARM AND RIGHT KNEE  GAIT/STATION:   narrow based gait; able to walk on toes, heels and tandem; romberg is negative    DIAGNOSTIC DATA (LABS, IMAGING, TESTING) - I reviewed patient records, labs, notes, testing and imaging myself where available.  Lab Results  Component Value Date   WBC 6.0 05/04/2016   HGB 11.1 (L) 05/04/2016   HCT 32.6 (L) 05/04/2016     MCV 84.2 05/04/2016   PLT 306 05/04/2016      Component Value Date/Time   NA 141 05/04/2016 1158   NA 136 11/13/2014 1018   K 4.2 05/04/2016 1158   CL 110 05/04/2016 1158   CO2 23 05/04/2016 1158   GLUCOSE 74 05/04/2016 1158   BUN 12 05/04/2016 1158   BUN 10 11/13/2014 1018   CREATININE 0.81 05/04/2016 1158   CALCIUM 8.8 (L) 05/04/2016 1158   PROT 7.6 05/04/2016 1158   PROT 7.3 11/13/2014 1018   ALBUMIN 3.8 05/04/2016 1158   ALBUMIN 4.0 11/13/2014 1018   AST 37 05/04/2016 1158   ALT 29 05/04/2016 1158   ALKPHOS 207 (H) 05/04/2016 1158   BILITOT 0.4 05/04/2016 1158   BILITOT 0.3 11/13/2014 1018   GFRNONAA >60 05/04/2016 1158   GFRAA >60 05/04/2016 1158   Lab Results  Component Value Date   CHOL 213 (H) 10/14/2014   HDL 70 10/14/2014   LDLCALC 132 (H) 10/14/2014   TRIG 57 10/14/2014   CHOLHDL 3.0 10/14/2014   Lab Results  Component Value Date   HGBA1C <4.0 (L) 10/26/2014   Lab Results  Component Value Date   VITAMINB12 1,459 (H) 10/26/2014   Lab Results  Component Value Date   TSH 0.742 10/26/2014   Lab Results  Component Value Date   PHENYTOIN 35.7 ([place]) 05/04/2016   07/15/15 CT head [I reviewed images myself and agree with interpretation. -[facility]]  1. No evidence of acute intracranial abnormality. 2. Chronic left MCA infarct. 3. No acute osseous abnormality identified in the cervical spine. 4. Mild platybasia, mild basilar invagination, and mild cervical spondylosis.  03/02/15 EEG - This awake EEG is abnormal due to focal slowing over the left temporal region.     ASSESSMENT AND PLAN  61 y.o. year old female here with history of chronic left MCA ischemic infarction, subsequent expressive aphasia and dysarthria with right hemiparesis, and seizure disorder. Apparently also has history of chronic alcohol abuse.   Recent ER visits in April 2018 likely related to dilantin toxicity. Now on lower dose. Will repeat level.   Dx:  1. Seizure disorder ([place])    2. Chronic ischemic left MCA stroke   3. Expressive aphasia   4. Accidental phenytoin poisoning, sequela      PLAN: I spent 25 minutes of face to face time  with patient. Greater than 50% of time was spent in counseling and coordination of care with patient. In summary we discussed:  - continue current meds (levetiracetam 750mg  BID, vimpat 150mg  BID and dilantin 100mg  BID) - repeat dilantin level today - annual labs (CBC, CMP) per PCP  Orders Placed This Encounter  Procedures  . Phenytoin level, free and total   Return in about 3 months (around 08/19/2016).   Penni Bombard, MD 05/27/3435, 35:78 AM Certified in Neurology, Neurophysiology and Neuroimaging  [facility] [address], [place], [place] [address] [phone]

## 2016-05-23 LAB — PHENYTOIN LEVEL, FREE AND TOTAL
Phenytoin, Free: 0.6 ug/mL — ABNORMAL LOW (ref 1.0–2.0)
Phenytoin, Serum: 8.7 ug/mL — ABNORMAL LOW (ref 10.0–20.0)

## 2016-05-24 ENCOUNTER — Telehealth: Payer: Self-pay | Admitting: *Deleted

## 2016-05-24 NOTE — Telephone Encounter (Signed)
[facility] where patient resides. Spoke with Tina Sanders, Med Tech. Informed her that patient's labs showed her Dilantin level is improved and no longer toxic; although slightly low. Advised Veronica that Dr Leta Baptist will continue with her current dosing of dilantin even though it's slightly low, because patient also on two other seizure medications. Veronica verbalized understanding.

## 2016-05-24 NOTE — Telephone Encounter (Signed)
Patient resides at [facility]. Attempted to reach facility x 2. No answer and phone call was disconnected both times after ringing several times. Will attempt to reach later re; Dilantin level results, medication instructions.

## 2016-06-06 ENCOUNTER — Other Ambulatory Visit: Payer: Self-pay

## 2016-06-06 NOTE — Telephone Encounter (Signed)
Spoke with patient's sister-patient has an appt tomorrow in [facility], they will address refills at that time.  Phone call complete.Despina Hidden Cassady5/29/201811:13 AM

## 2016-06-06 NOTE — Telephone Encounter (Signed)
Pt sister called requesting to speak with a nurse about meds. Please call back.

## 2016-06-07 ENCOUNTER — Ambulatory Visit (INDEPENDENT_AMBULATORY_CARE_PROVIDER_SITE_OTHER): Payer: Medicare Other | Admitting: Internal Medicine

## 2016-06-07 ENCOUNTER — Encounter: Payer: Self-pay | Admitting: Internal Medicine

## 2016-06-07 ENCOUNTER — Telehealth: Payer: Self-pay | Admitting: Diagnostic Neuroimaging

## 2016-06-07 VITALS — BP 107/64 | HR 80 | Temp 97.6°F | Wt 90.0 lb

## 2016-06-07 DIAGNOSIS — F0390 Unspecified dementia without behavioral disturbance: Secondary | ICD-10-CM | POA: Diagnosis not present

## 2016-06-07 DIAGNOSIS — F0391 Unspecified dementia with behavioral disturbance: Secondary | ICD-10-CM

## 2016-06-07 DIAGNOSIS — Z5189 Encounter for other specified aftercare: Secondary | ICD-10-CM | POA: Diagnosis present

## 2016-06-07 DIAGNOSIS — Z79899 Other long term (current) drug therapy: Secondary | ICD-10-CM | POA: Diagnosis not present

## 2016-06-07 DIAGNOSIS — K219 Gastro-esophageal reflux disease without esophagitis: Secondary | ICD-10-CM | POA: Diagnosis not present

## 2016-06-07 DIAGNOSIS — G40909 Epilepsy, unspecified, not intractable, without status epilepticus: Secondary | ICD-10-CM | POA: Diagnosis not present

## 2016-06-07 DIAGNOSIS — I69351 Hemiplegia and hemiparesis following cerebral infarction affecting right dominant side: Secondary | ICD-10-CM | POA: Diagnosis not present

## 2016-06-07 DIAGNOSIS — F17210 Nicotine dependence, cigarettes, uncomplicated: Secondary | ICD-10-CM

## 2016-06-07 DIAGNOSIS — Z720 Tobacco use: Secondary | ICD-10-CM

## 2016-06-07 DIAGNOSIS — I6930 Unspecified sequelae of cerebral infarction: Secondary | ICD-10-CM

## 2016-06-07 DIAGNOSIS — I10 Essential (primary) hypertension: Secondary | ICD-10-CM | POA: Diagnosis not present

## 2016-06-07 DIAGNOSIS — I69320 Aphasia following cerebral infarction: Secondary | ICD-10-CM | POA: Diagnosis not present

## 2016-06-07 DIAGNOSIS — K589 Irritable bowel syndrome without diarrhea: Secondary | ICD-10-CM

## 2016-06-07 MED ORDER — PHENYTOIN 125 MG/5ML PO SUSP: 100.0000 mg | Freq: Two times a day (BID) | ORAL | 0 refills | Status: DC

## 2016-06-07 MED ORDER — ASPIRIN 81 MG PO CHEW: 81.0000 mg | CHEWABLE_TABLET | Freq: Every day | ORAL | 1 refills | Status: DC

## 2016-06-07 MED ORDER — LISINOPRIL-HYDROCHLOROTHIAZIDE 10-12.5 MG PO TABS: 1.0000 | ORAL_TABLET | Freq: Every day | ORAL | 3 refills | Status: DC

## 2016-06-07 MED ORDER — NICOTINE 14 MG/24HR TD PT24: 14.0000 mg | MEDICATED_PATCH | TRANSDERMAL | 0 refills | Status: DC

## 2016-06-07 MED ORDER — RIVASTIGMINE TARTRATE 1.5 MG PO CAPS: 1.5000 mg | ORAL_CAPSULE | Freq: Two times a day (BID) | ORAL | 2 refills | Status: DC

## 2016-06-07 MED ORDER — LINACLOTIDE 145 MCG PO CAPS: 145.0000 ug | ORAL_CAPSULE | Freq: Every day | ORAL | 3 refills | Status: DC

## 2016-06-07 MED ORDER — OMEPRAZOLE 20 MG PO CPDR: 20.0000 mg | DELAYED_RELEASE_CAPSULE | Freq: Every day | ORAL | 3 refills | Status: DC

## 2016-06-07 MED ORDER — ATORVASTATIN CALCIUM 40 MG PO TABS: 40.0000 mg | ORAL_TABLET | Freq: Every day | ORAL | 1 refills | Status: DC

## 2016-06-07 MED ORDER — MIRTAZAPINE 7.5 MG PO TABS: 7.5000 mg | ORAL_TABLET | Freq: Every day | ORAL | 2 refills | Status: DC

## 2016-06-07 MED FILL — PHENYTOIN 125 MG/5 ML SUSP: 125 | 29 days supply | Qty: 237 | Fill #0

## 2016-06-07 NOTE — Progress Notes (Signed)
   CC: SNF discharge f/up   HPI:  [name] is a 61 y.o.   Pt has hx of left frontal ischemic infarct several years ago with subsequent seizure disorder. Has expressive aphasia. She went to the ED 3 times from the SNF due to seizure. She is on keppra, vimpat and dilantin.   She has been in [facility] assisted living for last 1 year or so and was discharged on 5/28, currently living with her sister. Doing well. Has not been using her cane due to height being different, the home health agency is working on getting it adjusted.   current seizure meds (levetiracetam 750mg  BID, vimpat 150mg  BID and dilantin 100mg  BID). Follows with Dr. Leta Baptist.  No other complaints   Past Medical History:  Diagnosis Date  . Alcohol abuse   . B12 deficiency   . GERD (gastroesophageal reflux disease)   . Hypertension   . IBS (irritable bowel syndrome)   . Seizures ([place])   . 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 Stroke [facility])    residual right sided deficits, expressive dysphasia  . Vitamin D deficiency disease   . Vitamin E deficiency     Review of Systems:  Review of Systems  Constitutional: Negative for chills and fever.  Cardiovascular: Negative for chest pain and palpitations.  Gastrointestinal: Negative for heartburn, nausea and vomiting.  Musculoskeletal: Negative for falls.  Neurological: Negative for dizziness.  Psychiatric/Behavioral: Positive for memory loss.     Physical Exam:  Vitals:   06/07/16 1453  BP: 107/64  Pulse: 80  Temp: 97.6 F (36.4 C)  TempSrc: Oral  SpO2: 100%  Weight: 90 lb (40.8 kg)   Physical Exam  Constitutional: She is oriented to person, place, and time. She appears well-developed. No distress.  Thin Elderly female.   Cardiovascular: Normal rate and regular rhythm.  Exam reveals no gallop and no friction rub.   No murmur heard. Respiratory: Effort normal and breath sounds normal. No respiratory distress. She has no wheezes.  Musculoskeletal:   Has gait imbalance.   Neurological: She is alert and oriented to person, place, and time.  Slurred speech.   Skin: She is not diaphoretic.  Psychiatric: She has a normal mood and affect.    Assessment & Plan:   See Encounters Tab for problem based charting.  Patient discussed with Dr. Angelia Mould

## 2016-06-07 NOTE — Assessment & Plan Note (Addendum)
Dementia likely is likely vascular. Has agitation with dementia Unclear why she is on exelon. However, she has been taking it upto now. I refilled it today. May consider d/cing in the future.   Refilled mirtazapine.

## 2016-06-07 NOTE — Assessment & Plan Note (Signed)
She is linzess. I have refilled it.

## 2016-06-07 NOTE — Assessment & Plan Note (Addendum)
Smoking 1 pack daily. Asking for nicotine patch. I have ordered it.

## 2016-06-07 NOTE — Assessment & Plan Note (Signed)
BP is well controlled. Refilled lisinopril-hctz 10-12.5mg  daily.

## 2016-06-07 NOTE — Assessment & Plan Note (Signed)
GERD well controlled. Refilled omeprazole

## 2016-06-07 NOTE — Telephone Encounter (Signed)
[name] calling to get new Rx's called to [facility] on [address] for phenytoin (DILANTIN) 125 MG/5ML suspension, levETIRAcetam (KEPPRA) 750 MG tablet, and Lacosamide (VIMPAT) 150 MG TABS. Patient has changed pharmacies.

## 2016-06-07 NOTE — Assessment & Plan Note (Signed)
current seizure meds (levetiracetam 750mg  BID, vimpat 150mg  BID and dilantin 100mg  BID). Follows with Dr. Leta Baptist.  I refilled dilantin today as she ran out. I asked them to request further anti epileptic med refill from the neurologist Dr. Leta Baptist.

## 2016-06-07 NOTE — Assessment & Plan Note (Signed)
Having residual right sided weakness and expressive aphasia. Has trouble with balance and has to use walker and cane but she has not been using them recently. I discussed the importance of using the devices. Home health agency is currently working on adjusting the equipments to her height.  I have ordered home health service as the current company is only going to be there few more days just because of recent discharge from the assisted living facility. She needs home health going forward too.

## 2016-06-07 NOTE — Patient Instructions (Signed)
Requested home health services  Follow up in 3 months.

## 2016-06-07 NOTE — Telephone Encounter (Signed)
Attempted to reach patient's sister. No answer, voice mailbox not set up.

## 2016-06-09 ENCOUNTER — Ambulatory Visit: Payer: Medicare Other

## 2016-06-09 MED ORDER — PHENYTOIN 125 MG/5ML PO SUSP: 100.0000 mg | Freq: Two times a day (BID) | ORAL | 11 refills | Status: DC

## 2016-06-09 MED ORDER — LACOSAMIDE 150 MG PO TABS: 150.0000 mg | ORAL_TABLET | Freq: Two times a day (BID) | ORAL | 3 refills | Status: DC

## 2016-06-09 MED ORDER — LEVETIRACETAM 750 MG PO TABS: 750.0000 mg | ORAL_TABLET | Freq: Two times a day (BID) | ORAL | 3 refills | Status: AC

## 2016-06-09 MED FILL — levETIRAcetam 750 MG TABS: 750 | 90 days supply | Qty: 180 | Fill #0

## 2016-06-09 NOTE — Telephone Encounter (Signed)
Attempted to reach patient's sister, Blanch Media to inform her the three Rx she requested have been sent to [facility]. No answer, voice mailbox not set up.

## 2016-06-09 NOTE — Addendum Note (Signed)
Addended by: Florian Buff C on: 06/09/2016 12:46 PM   Modules accepted: Orders

## 2016-06-12 NOTE — Progress Notes (Signed)
Internal Medicine Clinic Attending  Case discussed with Dr. Ahmed at the time of the visit.  We reviewed the resident's history and exam and pertinent patient test results.  I agree with the assessment, diagnosis, and plan of care documented in the resident's note. 

## 2016-06-13 ENCOUNTER — Other Ambulatory Visit: Payer: Self-pay

## 2016-06-13 NOTE — Telephone Encounter (Signed)
Pt's sister came by the clinic - stated [facility] needs pt's insurance info faxed to them in order to provide home delivery. Copies were made of insurance cards and faxed to them.

## 2016-06-13 NOTE — Telephone Encounter (Signed)
Pt's sister,Joyce, wanted to know which pharmacy pt's meds were sent which is [facility]. Stated she will call to see they are ready for pick up.

## 2016-06-13 NOTE — Telephone Encounter (Signed)
Want to know which pharmacy meds were sent to. Per patient sister she is using [facility].

## 2016-06-20 ENCOUNTER — Telehealth: Payer: Self-pay

## 2016-06-20 NOTE — Telephone Encounter (Signed)
Caren Griffins from [facility] home health requesting VO. Please call back.

## 2016-06-27 ENCOUNTER — Telehealth: Payer: Self-pay | Admitting: *Deleted

## 2016-06-27 ENCOUNTER — Other Ambulatory Visit: Payer: Self-pay

## 2016-06-27 NOTE — Telephone Encounter (Signed)
I do not see that we ever have Rx'd ensure.  Dr Genene Churn documented : I refilled dilantin today as she ran out. I asked them to request further anti epileptic med refill from the neurologist Dr. Leta Baptist.  Her last level was high. These two things enforce she needs to contact neuro.

## 2016-06-27 NOTE — Telephone Encounter (Signed)
Spoke w/ pt's sister/ caregiver, she was present when nurse was in home, she states she told the nurse pt was not having any breathing problems but nurse stated no no, she is supposed to have the breathing machine for shortness of breath, pt and sister state pt is not having problem, caregiver was ask to call for an appt if it is noted or to call 911 otherwise it can be addressed at next appt, both are agreeable

## 2016-06-27 NOTE — Telephone Encounter (Signed)
phenytoin (DILANTIN) 125 MG/5ML suspension, refill request @ [facility].

## 2016-06-27 NOTE — Telephone Encounter (Signed)
Pt's sister calls and states she needs a neblizer and the albuterol solution for the nebulizer, states HHN states pt needs this asap. Please advise

## 2016-06-27 NOTE — Telephone Encounter (Signed)
Called cack, she needs to know pt's pcp name, will speak w/ doriss.

## 2016-06-27 NOTE — Telephone Encounter (Signed)
Thank you for clarification. Will not order neb at this time

## 2016-06-27 NOTE — Telephone Encounter (Signed)
Tina Sanders can you please call the pt's sister, she wanted to speak w/ a social worker from the clinic, call her at the ph# listed on chart

## 2016-06-27 NOTE — Telephone Encounter (Signed)
We did D/C her on neb but that was Feb 2017. Not addressed since then. I called number - no voice mail. What are her sxs, why urgent, why is HH worried about?

## 2016-06-29 ENCOUNTER — Telehealth: Payer: Self-pay

## 2016-06-29 MED FILL — PHENYTOIN 125 MG/5ML SUSP: 125 | 29 days supply | Qty: 237 | Fill #0

## 2016-06-29 NOTE — Telephone Encounter (Signed)
Yes, VO is okay. Thanks.

## 2016-06-29 NOTE — Telephone Encounter (Signed)
Dyann Ruddle with [facility] home [facility] requesting VO. Please call back at [phone] or [phone].

## 2016-06-29 NOTE — Telephone Encounter (Signed)
[facility] calls for VO for home PT, 2x week for 3 weeks for safety, gait training, balance and strength do you agree?

## 2016-07-03 ENCOUNTER — Ambulatory Visit (INDEPENDENT_AMBULATORY_CARE_PROVIDER_SITE_OTHER): Payer: Medicare Other

## 2016-07-03 ENCOUNTER — Encounter: Payer: Self-pay | Admitting: Podiatry

## 2016-07-03 ENCOUNTER — Encounter: Payer: Self-pay | Admitting: *Deleted

## 2016-07-03 ENCOUNTER — Ambulatory Visit (INDEPENDENT_AMBULATORY_CARE_PROVIDER_SITE_OTHER): Payer: Medicare Other | Admitting: Podiatry

## 2016-07-03 DIAGNOSIS — M79671 Pain in right foot: Secondary | ICD-10-CM

## 2016-07-03 DIAGNOSIS — R52 Pain, unspecified: Secondary | ICD-10-CM

## 2016-07-03 DIAGNOSIS — M21371 Foot drop, right foot: Secondary | ICD-10-CM

## 2016-07-03 DIAGNOSIS — M109 Gout, unspecified: Secondary | ICD-10-CM | POA: Diagnosis not present

## 2016-07-03 NOTE — Progress Notes (Signed)
Subjective:    Patient ID: Tina Sanders, female   DOB: 61 y.o.   MRN: [id number]   HPI 61 year old female presents the also the feeling upper for concerns of pain is swelling to the right big toe which is been ongoing for about 3 weeks. Patient denies any recent injury or trauma to the area. She just recently got out of rehabilitation for stroke and her sister states that when she picked her up 3 weeks and noticed that the the big toe is swollen. She's concerned for possible gout. She said no recent treatment for this. She has no other concerns.   Review of Systems  All other systems reviewed and are negative.       Objective:  Physical Exam General: NAD  Dermatological:  [place] edema to the right hallux was on the hallux IPJ. There is no erythema or increase in warmth. There is no open lesions identified at this time bilaterally. There is no pre-ulcerative callus is identified.  Vascular: Dorsalis Pedis artery and Posterior Tibial artery pedal pulses are 2/4 bilateral with immedate capillary fill time. There is no pain with calf compression, swelling, warmth, erythema.   Neruologic:  Sensation decreased with Derrel Nip monofilament.  Musculoskeletal: There is tenderness to the right hallux mostly along the hallux IPJ. There is minimal tenderness of the first MTPJ on the right side. There is no specific area pinpoint bony tenderness or pain the vibratory sensation. There is mild edema to the right hallux compared to contralateral extremity. There is no erythema or increase in warmth. Strength decreased on the right side secondary to stroke and she has dropfoot. Her sister does state that she drags her foot. This could be contributing to her hallux pain and swelling. No other areas of tenderness identified this time.  Assessment: Right hallux pain, swelling  Plan: -Treatment options discussed including all alternatives, risks, and complications -Etiology of symptoms were  discussed -X-rays were obtained and reviewed with the patient. No evidence of acute fracture identified. -Order blood work including CBC, ESR, CRP, uric acid. I do not with his gout however we will check. -I did dispense ankle brace to help prevent dropfoot. I believe that since she is driving her foot it is causing pain to her big toe. Hopefully the ankle brace will and directed to help the hallux. I don't want to put into a surgical shoe for mobilization given her dropfoot and weakness of the right side. -Follow-up in 2 weeks or sooner if needed. Call any questions concerns.  Celesta Gentile, DPM

## 2016-07-04 NOTE — Telephone Encounter (Signed)
Called and gave [facility] pcp name

## 2016-07-05 ENCOUNTER — Other Ambulatory Visit: Payer: Self-pay | Admitting: *Deleted

## 2016-07-13 ENCOUNTER — Other Ambulatory Visit: Payer: Self-pay | Admitting: Internal Medicine

## 2016-07-13 DIAGNOSIS — I6930 Unspecified sequelae of cerebral infarction: Secondary | ICD-10-CM

## 2016-07-13 MED ORDER — LACOSAMIDE 150 MG PO TABS: 150.0000 mg | ORAL_TABLET | Freq: Two times a day (BID) | ORAL | 3 refills | Status: DC

## 2016-07-13 NOTE — Telephone Encounter (Signed)
Lacosamide (VIMPAT) 150 MG TABS, refill request @ [facility].

## 2016-07-18 ENCOUNTER — Telehealth: Payer: Self-pay

## 2016-07-18 NOTE — Telephone Encounter (Signed)
Thank you :)

## 2016-07-18 NOTE — Telephone Encounter (Signed)
[name] requesting personal care services advised patient that I would initiate PCS form and place in PCP box she will be contacted by [facility] to sent assessment for serivces

## 2016-07-21 ENCOUNTER — Ambulatory Visit (INDEPENDENT_AMBULATORY_CARE_PROVIDER_SITE_OTHER): Payer: Medicare Other | Admitting: Podiatry

## 2016-07-21 DIAGNOSIS — M109 Gout, unspecified: Secondary | ICD-10-CM | POA: Diagnosis not present

## 2016-07-21 DIAGNOSIS — M21371 Foot drop, right foot: Secondary | ICD-10-CM

## 2016-07-24 NOTE — Progress Notes (Signed)
Subjective: 61 year old female presents the office today for follow-up evaluation of right hallux pain. Since last appointment her sister states that she has not been complaining any pain in the swelling has improved however she still has some minimal swelling. They do not get the blood work completed. She has been wearing the ankle brace which does help. She has no new concerns. Denies any systemic complaints such as fevers, chills, nausea, vomiting. No acute changes since last appointment, and no other complaints at this time.   Objective: AAO x3, NAD DP/PT pulses palpable bilaterally, CRT less than 3 seconds Unable to elicit any area of tenderness bilateral lower extremity is. There is pain to edema to the right hallux there is no erythema or increase in warmth. As noted with MPJ range of motion IPJ range of motion. 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 No open lesions or pre-ulcerative lesions. There is no pain compression, swelling, warmth, erythema.   Assessment: 61 year old female with resolving right hallux pain   Plan: -Treatment options discussed including all alternatives, risks, and complications -At this point her symptoms have almost completely resolved and the swelling is a most results. She can return to regular shoe as tolerated and gradually increase activity. Monitoring reoccurrence. Ankle brace if needed. This was more to help with her dropfoot. Long-term she may need a brace.  -Offloading pads were dispensed to the bunion.   Celesta Gentile, DPM

## 2016-07-28 ENCOUNTER — Telehealth: Payer: Self-pay | Admitting: *Deleted

## 2016-07-28 ENCOUNTER — Other Ambulatory Visit: Payer: Self-pay | Admitting: Pharmacist

## 2016-07-28 DIAGNOSIS — K219 Gastro-esophageal reflux disease without esophagitis: Secondary | ICD-10-CM

## 2016-07-28 DIAGNOSIS — J45909 Unspecified asthma, uncomplicated: Secondary | ICD-10-CM

## 2016-07-28 MED ORDER — OMEPRAZOLE 20 MG PO CPDR: 20.0000 mg | DELAYED_RELEASE_CAPSULE | Freq: Every day | ORAL | 3 refills | Status: DC

## 2016-07-28 MED ORDER — ALBUTEROL SULFATE (5 MG/ML) 0.5% IN NEBU: 2.5000 mg | INHALATION_SOLUTION | Freq: Four times a day (QID) | RESPIRATORY_TRACT | 12 refills | Status: DC | PRN

## 2016-07-28 NOTE — Telephone Encounter (Signed)
Pt needs one of the devices to wear to call in case of emergency, I think like life alert or something like it, can do we know of a way to help this happen?

## 2016-07-28 NOTE — Telephone Encounter (Signed)
Sister will do some looking and will give her a brochure when she returns

## 2016-07-28 NOTE — Telephone Encounter (Signed)
I have no idea how to get one. I think you have to contact the company directly and pay for it

## 2016-08-15 MED FILL — PHENYTOIN 125 MG/5ML SUSP: 125 | 29 days supply | Qty: 237 | Fill #1

## 2016-08-27 ENCOUNTER — Encounter (HOSPITAL_COMMUNITY): Payer: Self-pay | Admitting: Emergency Medicine

## 2016-08-27 ENCOUNTER — Emergency Department (HOSPITAL_COMMUNITY): Payer: Medicare Other

## 2016-08-27 ENCOUNTER — Observation Stay (HOSPITAL_COMMUNITY)
Admission: EM | Admit: 2016-08-27 | Discharge: 2016-08-28 | Disposition: A | Discharge status: Home / Self Care | Payer: Medicare Other | Attending: Internal Medicine | Admitting: Internal Medicine

## 2016-08-27 DIAGNOSIS — G40909 Epilepsy, unspecified, not intractable, without status epilepticus: Secondary | ICD-10-CM | POA: Insufficient documentation

## 2016-08-27 DIAGNOSIS — R4701 Aphasia: Secondary | ICD-10-CM | POA: Diagnosis not present

## 2016-08-27 DIAGNOSIS — I998 Other disorder of circulatory system: Secondary | ICD-10-CM | POA: Insufficient documentation

## 2016-08-27 DIAGNOSIS — F0391 Unspecified dementia with behavioral disturbance: Secondary | ICD-10-CM | POA: Diagnosis not present

## 2016-08-27 DIAGNOSIS — F17210 Nicotine dependence, cigarettes, uncomplicated: Secondary | ICD-10-CM | POA: Diagnosis not present

## 2016-08-27 DIAGNOSIS — E538 Deficiency of other specified B group vitamins: Secondary | ICD-10-CM

## 2016-08-27 DIAGNOSIS — Z720 Tobacco use: Secondary | ICD-10-CM | POA: Diagnosis present

## 2016-08-27 DIAGNOSIS — I6930 Unspecified sequelae of cerebral infarction: Secondary | ICD-10-CM | POA: Diagnosis not present

## 2016-08-27 DIAGNOSIS — K219 Gastro-esophageal reflux disease without esophagitis: Secondary | ICD-10-CM | POA: Insufficient documentation

## 2016-08-27 DIAGNOSIS — T420X1A Poisoning by hydantoin derivatives, accidental (unintentional), initial encounter: Secondary | ICD-10-CM | POA: Diagnosis present

## 2016-08-27 DIAGNOSIS — Z7982 Long term (current) use of aspirin: Secondary | ICD-10-CM | POA: Diagnosis not present

## 2016-08-27 DIAGNOSIS — D5720 Sickle-cell/Hb-C disease without crisis: Secondary | ICD-10-CM | POA: Diagnosis present

## 2016-08-27 DIAGNOSIS — E440 Moderate protein-calorie malnutrition: Secondary | ICD-10-CM | POA: Diagnosis present

## 2016-08-27 DIAGNOSIS — J45909 Unspecified asthma, uncomplicated: Secondary | ICD-10-CM | POA: Diagnosis not present

## 2016-08-27 DIAGNOSIS — R1310 Dysphagia, unspecified: Secondary | ICD-10-CM | POA: Insufficient documentation

## 2016-08-27 DIAGNOSIS — X58XXXA Exposure to other specified factors, initial encounter: Secondary | ICD-10-CM | POA: Insufficient documentation

## 2016-08-27 DIAGNOSIS — Z681 Body mass index (BMI) 19 or less, adult: Secondary | ICD-10-CM | POA: Diagnosis not present

## 2016-08-27 DIAGNOSIS — Z79899 Other long term (current) drug therapy: Secondary | ICD-10-CM | POA: Insufficient documentation

## 2016-08-27 DIAGNOSIS — I10 Essential (primary) hypertension: Secondary | ICD-10-CM | POA: Diagnosis not present

## 2016-08-27 DIAGNOSIS — F0390 Unspecified dementia without behavioral disturbance: Secondary | ICD-10-CM | POA: Diagnosis not present

## 2016-08-27 DIAGNOSIS — I69351 Hemiplegia and hemiparesis following cerebral infarction affecting right dominant side: Secondary | ICD-10-CM | POA: Diagnosis not present

## 2016-08-27 DIAGNOSIS — E46 Unspecified protein-calorie malnutrition: Secondary | ICD-10-CM | POA: Insufficient documentation

## 2016-08-27 DIAGNOSIS — K589 Irritable bowel syndrome without diarrhea: Secondary | ICD-10-CM | POA: Diagnosis present

## 2016-08-27 DIAGNOSIS — E559 Vitamin D deficiency, unspecified: Secondary | ICD-10-CM | POA: Diagnosis not present

## 2016-08-27 DIAGNOSIS — I69391 Dysphagia following cerebral infarction: Secondary | ICD-10-CM | POA: Insufficient documentation

## 2016-08-27 DIAGNOSIS — E785 Hyperlipidemia, unspecified: Secondary | ICD-10-CM | POA: Diagnosis not present

## 2016-08-27 DIAGNOSIS — T420X5A Adverse effect of hydantoin derivatives, initial encounter: Principal | ICD-10-CM | POA: Insufficient documentation

## 2016-08-27 LAB — CBC
HCT: 35.8 % — ABNORMAL LOW (ref 36.0–46.0)
HEMATOCRIT: 30.6 % — AB (ref 36.0–46.0)
HEMOGLOBIN: 10.7 g/dL — AB (ref 12.0–15.0)
Hemoglobin: 12.6 g/dL (ref 12.0–15.0)
MCH: 29.9 pg (ref 26.0–34.0)
MCH: 29.9 pg (ref 26.0–34.0)
MCHC: 35.2 g/dL (ref 30.0–36.0)
MCHC: 35 g/dL (ref 30.0–36.0)
MCV: 85 fL (ref 78.0–100.0)
MCV: 85.5 fL (ref 78.0–100.0)
PLATELETS: 327 10*3/uL (ref 150–400)
Platelets: 281 10*3/uL (ref 150–400)
RBC: 4.21 MIL/uL (ref 3.87–5.11)
RBC: 3.58 MIL/uL — AB (ref 3.87–5.11)
RDW: 13.4 % (ref 11.5–15.5)
RDW: 13.7 % (ref 11.5–15.5)
WBC: 6.3 10*3/uL (ref 4.0–10.5)
WBC: 6.8 10*3/uL (ref 4.0–10.5)

## 2016-08-27 LAB — I-STAT CHEM 8, ED
BUN: 16 mg/dL (ref 6–20)
CALCIUM ION: 1.05 mmol/L — AB (ref 1.15–1.40)
Chloride: 105 mmol/L (ref 101–111)
Creatinine, Ser: 0.7 mg/dL (ref 0.44–1.00)
Glucose, Bld: 101 mg/dL — ABNORMAL HIGH (ref 65–99)
HCT: 43 % (ref 36.0–46.0)
HEMOGLOBIN: 14.6 g/dL (ref 12.0–15.0)
Potassium: 3.6 mmol/L (ref 3.5–5.1)
SODIUM: 140 mmol/L (ref 135–145)
TCO2: 23 mmol/L (ref 0–100)

## 2016-08-27 LAB — COMPREHENSIVE METABOLIC PANEL
ALK PHOS: 142 U/L — AB (ref 38–126)
ALT: 36 U/L (ref 14–54)
ANION GAP: 10 (ref 5–15)
AST: 53 U/L — ABNORMAL HIGH (ref 15–41)
Albumin: 4.1 g/dL (ref 3.5–5.0)
BILIRUBIN TOTAL: 0.7 mg/dL (ref 0.3–1.2)
BUN: 12 mg/dL (ref 6–20)
CALCIUM: 9.1 mg/dL (ref 8.9–10.3)
CO2: 23 mmol/L (ref 22–32)
Chloride: 105 mmol/L (ref 101–111)
Creatinine, Ser: 0.81 mg/dL (ref 0.44–1.00)
Glucose, Bld: 104 mg/dL — ABNORMAL HIGH (ref 65–99)
POTASSIUM: 3.6 mmol/L (ref 3.5–5.1)
Sodium: 138 mmol/L (ref 135–145)
TOTAL PROTEIN: 8 g/dL (ref 6.5–8.1)

## 2016-08-27 LAB — CBG MONITORING, ED: Glucose-Capillary: 110 mg/dL — ABNORMAL HIGH (ref 65–99)

## 2016-08-27 LAB — ETHANOL

## 2016-08-27 LAB — I-STAT TROPONIN, ED: TROPONIN I, POC: 0 ng/mL (ref 0.00–0.08)

## 2016-08-27 LAB — DIFFERENTIAL
Basophils Absolute: 0 10*3/uL (ref 0.0–0.1)
Basophils Relative: 0 %
EOS ABS: 0.2 10*3/uL (ref 0.0–0.7)
EOS PCT: 4 %
LYMPHS ABS: 1.6 10*3/uL (ref 0.7–4.0)
LYMPHS PCT: 26 %
MONO ABS: 0.9 10*3/uL (ref 0.1–1.0)
Monocytes Relative: 14 %
Neutro Abs: 3.6 10*3/uL (ref 1.7–7.7)
Neutrophils Relative %: 56 %

## 2016-08-27 LAB — TSH: TSH: 1.281 u[IU]/mL (ref 0.350–4.500)

## 2016-08-27 LAB — PROTIME-INR
INR: 1.1
PROTHROMBIN TIME: 14.2 s (ref 11.4–15.2)

## 2016-08-27 LAB — CREATININE, SERUM: CREATININE: 0.67 mg/dL (ref 0.44–1.00)

## 2016-08-27 LAB — APTT: aPTT: 26 seconds (ref 24–36)

## 2016-08-27 LAB — PHENYTOIN LEVEL, TOTAL: PHENYTOIN LVL: 34.6 ug/mL — AB (ref 10.0–20.0)

## 2016-08-27 MED ORDER — ENOXAPARIN SODIUM 30 MG/0.3ML ~~LOC~~ SOLN
30.0000 mg | Freq: Every day | SUBCUTANEOUS | Status: DC
  Administered 2016-08-28: 30 mg via SUBCUTANEOUS
  Filled 2016-08-27: qty 0.3

## 2016-08-27 MED ORDER — ACETAMINOPHEN 650 MG RE SUPP: 650.0000 mg | Freq: Four times a day (QID) | RECTAL | Status: DC | PRN

## 2016-08-27 MED ORDER — PANTOPRAZOLE SODIUM 40 MG PO TBEC
40.0000 mg | DELAYED_RELEASE_TABLET | Freq: Every day | ORAL | Status: DC
  Administered 2016-08-28: 40 mg via ORAL
  Filled 2016-08-27: qty 1

## 2016-08-27 MED ORDER — ADULT MULTIVITAMIN W/MINERALS CH: 1.0000 | ORAL_TABLET | Freq: Every day | ORAL | Status: DC

## 2016-08-27 MED ORDER — ADULT MULTIVITAMIN W/MINERALS CH
1.0000 | ORAL_TABLET | Freq: Every day | ORAL | Status: DC
  Administered 2016-08-28: 1 via ORAL
  Filled 2016-08-27: qty 1

## 2016-08-27 MED ORDER — FOLIC ACID 1 MG PO TABS
1.0000 mg | ORAL_TABLET | Freq: Every day | ORAL | Status: DC
  Administered 2016-08-28: 1 mg via ORAL
  Filled 2016-08-27: qty 1

## 2016-08-27 MED ORDER — LEVETIRACETAM 750 MG PO TABS
750.0000 mg | ORAL_TABLET | Freq: Two times a day (BID) | ORAL | Status: DC
  Administered 2016-08-27 – 2016-08-28 (×2): 750 mg via ORAL
  Filled 2016-08-27 (×2): qty 1

## 2016-08-27 MED ORDER — LINACLOTIDE 145 MCG PO CAPS
145.0000 ug | ORAL_CAPSULE | Freq: Every day | ORAL | Status: DC
  Administered 2016-08-28: 145 ug via ORAL
  Filled 2016-08-27: qty 1

## 2016-08-27 MED ORDER — VITAMIN D 1000 UNITS PO TABS
1000.0000 [IU] | ORAL_TABLET | Freq: Every day | ORAL | Status: DC
  Administered 2016-08-28: 1000 [IU] via ORAL
  Filled 2016-08-27: qty 1

## 2016-08-27 MED ORDER — ATORVASTATIN CALCIUM 40 MG PO TABS
40.0000 mg | ORAL_TABLET | Freq: Every day | ORAL | Status: DC
  Administered 2016-08-28: 40 mg via ORAL
  Filled 2016-08-27: qty 1

## 2016-08-27 MED ORDER — ACETAMINOPHEN 325 MG PO TABS: 650.0000 mg | ORAL_TABLET | Freq: Four times a day (QID) | ORAL | Status: DC | PRN

## 2016-08-27 MED ORDER — ASPIRIN 81 MG PO CHEW
81.0000 mg | CHEWABLE_TABLET | Freq: Every day | ORAL | Status: DC
  Administered 2016-08-28: 81 mg via ORAL
  Filled 2016-08-27: qty 1

## 2016-08-27 MED ORDER — MIRTAZAPINE 15 MG PO TABS
7.5000 mg | ORAL_TABLET | Freq: Every day | ORAL | Status: DC
  Administered 2016-08-27: 7.5 mg via ORAL
  Filled 2016-08-27: qty 1

## 2016-08-27 MED ORDER — LISINOPRIL-HYDROCHLOROTHIAZIDE 10-12.5 MG PO TABS: 1.0000 | ORAL_TABLET | Freq: Every day | ORAL | Status: DC

## 2016-08-27 MED ORDER — LACOSAMIDE 50 MG PO TABS
150.0000 mg | ORAL_TABLET | Freq: Two times a day (BID) | ORAL | Status: DC
  Administered 2016-08-27 – 2016-08-28 (×2): 150 mg via ORAL
  Filled 2016-08-27 (×2): qty 3

## 2016-08-27 MED ORDER — VITAMIN B-1 100 MG PO TABS
100.0000 mg | ORAL_TABLET | Freq: Every day | ORAL | Status: DC
  Administered 2016-08-28: 100 mg via ORAL
  Filled 2016-08-27: qty 1

## 2016-08-27 MED ORDER — IPRATROPIUM-ALBUTEROL 0.5-2.5 (3) MG/3ML IN SOLN: 3.0000 mL | RESPIRATORY_TRACT | Status: DC | PRN

## 2016-08-27 MED ORDER — ONDANSETRON HCL 4 MG/2ML IJ SOLN: 4.0000 mg | Freq: Four times a day (QID) | INTRAMUSCULAR | Status: DC | PRN

## 2016-08-27 MED ORDER — ONDANSETRON HCL 4 MG PO TABS
4.0000 mg | ORAL_TABLET | Freq: Four times a day (QID) | ORAL | Status: DC | PRN
Start: 2016-08-27 — End: 2016-08-28

## 2016-08-27 MED ORDER — RIVASTIGMINE TARTRATE 1.5 MG PO CAPS
1.5000 mg | ORAL_CAPSULE | Freq: Two times a day (BID) | ORAL | Status: DC
  Administered 2016-08-27 – 2016-08-28 (×2): 1.5 mg via ORAL
  Filled 2016-08-27 (×3): qty 1

## 2016-08-27 MED ORDER — NICOTINE 14 MG/24HR TD PT24
14.0000 mg | MEDICATED_PATCH | TRANSDERMAL | Status: DC
  Administered 2016-08-27 – 2016-08-28 (×2): 14 mg via TRANSDERMAL
  Filled 2016-08-27 (×2): qty 1

## 2016-08-27 MED ORDER — SODIUM CHLORIDE 0.9 % IV SOLN
INTRAVENOUS | Status: DC
  Administered 2016-08-27: 23:00:00 via INTRAVENOUS
  Administered 2016-08-28: 1000 mL via INTRAVENOUS

## 2016-08-27 NOTE — ED Notes (Signed)
Attempted report, Katie, Agricultural consultant stated she had not assigned pt yet. Call back in 15mins.

## 2016-08-27 NOTE — H&P (Signed)
History and Physical  Tina Sanders OTL:[id number] DOB: 08-29-1955 DOA: 08/27/2016  Referring physician: Jeneen Rinks MD PCP: Aldine Contes, MD  Neurologist: Penni Bombard, MD   Chief Complaint: unable to speak  HPI: Tina Sanders is a 61 y.o. 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  Pt resides at home and family brought to ED because patient had become completely nonverbal over the course of the day.  She had been at baseline earlier in the day.  Her baseline is reported that she has marked aphasia and dysarthria at baseline after the large CVA.  The patient also has a history of substance abuse and chronic tobacco abuse.  Her caretaker says that she has been giving her 4 dropper fulls of dilantin suspension BID.  She didn't bring the dropper so we could verify the amount per dropper.  The patient is prescribed to take 4 ml BID of the 125 mg /5 ml strength dilantin solution which is 100 mg BID.  I am assuming that 1 dropper is supposed to equal 1 teaspoon.   ED Course: patient arrived to ED from home and Code Stroke was called. CT and MRI did not show acute findings and code stroke was canceled.  Her serum dilantin level was noted to be markedly elevated at 35.  The neurology service recommended that the dilantin be held and that patient be observed for repeat serum dilantin levels.  Of note, 3 months ago the serum dilantin level was also 35 and unsure of what interventions were taken at that time but family reports that the dilantin dose was reduced at that time to 100 mg BID by her neurologist.     Severity of Illness: The appropriate patient status for this patient is OBSERVATION. Observation status is judged to be reasonable and necessary in order to provide the required intensity of service to ensure the patient's safety. The patient's presenting symptoms, physical exam findings, and initial radiographic and laboratory data in the  context of their medical condition is felt to place them at decreased risk for further clinical deterioration. Furthermore, it is anticipated that the patient will be medically stable for discharge from the hospital within 2 midnights of admission. The following factors support the patient status of observation.   " The patient's presenting symptoms include APHASIC. " The physical exam findings include UNABLE TO SPEAK. " The initial radiographic and laboratory data are SUPRATHERAPEUTIC SERUM DILANTIN LEVEL.  Review of Systems: UNABLE TO OBTAIN   Past Medical History:  Diagnosis Date  . Alcohol abuse   . B12 deficiency   . GERD (gastroesophageal reflux disease)   . Hypertension   . IBS (irritable bowel syndrome)   . Seizures ([place])   . 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 Stroke [facility])    residual right sided deficits, expressive dysphasia  . Vitamin D deficiency disease   . Vitamin E deficiency    History reviewed. No pertinent surgical history. Social History:  reports that she has been smoking Cigarettes.  She has a 10.00 pack-year smoking history. She has never used smokeless tobacco. She reports that she does not drink alcohol or use drugs.  Allergies  Allergen Reactions  . Penicillins Other (See Comments)    abdominal bloating Has patient had a PCN reaction causing immediate rash, facial/tongue/throat swelling, SOB or lightheadedness with hypotension: No Has patient had a PCN reaction causing severe rash involving mucus membranes or skin necrosis: No Has patient had a PCN  reaction that required hospitalization No Has patient had a PCN reaction occurring within the last 10 years: No If all of the above answers are "NO", then may proceed with Cephalosporin use.   . Nicotine Itching, Dermatitis and Rash    Family History  Problem Relation Age of Onset  . Hypertension Mother   . Hypertension Father   . Kidney disease Father   . Hyperlipidemia Father   . Hypertension Sister     . Diabetes Sister     Prior to Admission medications   Medication Sig Start Date End Date Taking? Authorizing Provider  aspirin 81 MG chewable tablet Chew 1 tablet (81 mg total) by mouth daily. 06/07/16  Yes Ahmed, Chesley Mires, MD  atorvastatin (LIPITOR) 40 MG tablet Take 1 tablet (40 mg total) by mouth daily. 07/13/16  Yes Aldine Contes, MD  cholecalciferol (VITAMIN D) 1000 units tablet Take 1,000 Units by mouth daily.   Yes [provider]  Lacosamide (VIMPAT) 150 MG TABS Take 1 tablet (150 mg total) by mouth 2 (two) times daily. 07/13/16  Yes Aldine Contes, MD  levETIRAcetam (KEPPRA) 750 MG tablet Take 1 tablet (750 mg total) by mouth 2 (two) times daily. 06/09/16  Yes Penumalli, Earlean Polka, MD  linaclotide (LINZESS) 145 MCG CAPS capsule Take 1 capsule (145 mcg total) by mouth daily. 06/07/16  Yes Ahmed, Chesley Mires, MD  lisinopril-hydrochlorothiazide (ZESTORETIC) 10-12.5 MG tablet Take 1 tablet by mouth daily. 06/07/16 06/06/17 Yes Ahmed, Chesley Mires, MD  mirtazapine (REMERON) 7.5 MG tablet Take 1 tablet (7.5 mg total) by mouth at bedtime. 06/07/16  Yes Ahmed, Chesley Mires, MD  Multiple Vitamin (MULTIVITAMIN WITH MINERALS) TABS tablet Take 1 tablet by mouth daily. 10/31/14  Yes Iline Oven, MD  omeprazole (PRILOSEC) 20 MG capsule Take 1 capsule (20 mg total) by mouth daily. 07/28/16 07/27/17 Yes Aldine Contes, MD  phenytoin (DILANTIN) 125 MG/5ML suspension Take 4 mLs (100 mg total) by mouth 2 (two) times daily. 06/09/16  Yes Penumalli, Earlean Polka, MD  rivastigmine (EXELON) 1.5 MG capsule Take 1 capsule (1.5 mg total) by mouth 2 (two) times daily. 06/07/16  Yes Ahmed, Chesley Mires, MD  albuterol (PROVENTIL) (5 MG/ML) 0.5% nebulizer solution Take 0.5 mLs (2.5 mg total) by nebulization every 6 (six) hours as needed for wheezing or shortness of breath. 07/28/16   Aldine Contes, MD  nicotine (NICODERM CQ - DOSED IN MG/24 HOURS) 14 mg/24hr patch Place 1 patch (14 mg total) onto the skin daily. Patient not taking:  Reported on 08/27/2016 06/07/16 06/07/17  Dellia Nims, MD   Physical Exam: Vitals:   08/27/16 1630 08/27/16 1700 08/27/16 1730 08/27/16 1756  BP: 102/68 118/66 99/66   Pulse: 80 80 81   Resp: (!) 21 13 (!) 21   Temp:    97.9 F (36.6 C)  TempSrc:      SpO2: 100% 99% 100%   Weight:       Constitutional: No distress. Thin frail emaciated appearing female, NAD. Pt is cooperative. Pt is verbalizing during my exam.  HENT: Head: Normocephalic. Atraumatic.   Eyes: Pupils are equal, round, and reactive to light. Conjunctivae are normal. No scleral icterus.  Neck: Normal range of motion. Neck supple. No thyromegaly present.  Cardiovascular: Normal rate and regular rhythm.  Exam reveals no gallop and no friction rub.  No murmur heard. Pulmonary/Chest: Effort normal and breath sounds normal. No respiratory distress. She has no wheezes. She has no rales.  Abdominal: Soft. Bowel sounds are normal. She exhibits no distension. There is  no tenderness. There is no rebound.  Musculoskeletal: Normal range of motion.  Neurological: vocalizing. No obvious facial droop. Awake and alert.  Normal use and movement of left upper and left lower extremities. Does not appear post-ictal.  Skin: Skin is warm and dry. No rash noted.  Psychiatric: She has a normal affect.    Labs on Admission:  Basic Metabolic Panel:  Recent Labs Lab 08/27/16 1429 08/27/16 1438  NA 138 140  K 3.6 3.6  CL 105 105  CO2 23  --   GLUCOSE 104* 101*  BUN 12 16  CREATININE 0.81 0.70  CALCIUM 9.1  --    Liver Function Tests:  Recent Labs Lab 08/27/16 1429  AST 53*  ALT 36  ALKPHOS 142*  BILITOT 0.7  PROT 8.0  ALBUMIN 4.1   No results for input(s): LIPASE, AMYLASE in the last 168 hours. No results for input(s): AMMONIA in the last 168 hours. CBC:  Recent Labs Lab 08/27/16 1429 08/27/16 1438  WBC 6.3  --   NEUTROABS 3.6  --   HGB 12.6 14.6  HCT 35.8* 43.0  MCV 85.0  --   PLT 327  --    Cardiac Enzymes: No  results for input(s): CKTOTAL, CKMB, CKMBINDEX, TROPONINI in the last 168 hours.  BNP (last 3 results) No results for input(s): PROBNP in the last 8760 hours. CBG:  Recent Labs Lab 08/27/16 1428  GLUCAP 110*    Radiological Exams on Admission: Mr Brain Wo Contrast  Result Date: 08/27/2016 CLINICAL DATA:  Expressive aphasia. No acute finding by CT earlier today. EXAM: MRI HEAD WITHOUT CONTRAST TECHNIQUE: Multiplanar, multiecho pulse sequences of the brain and surrounding structures were obtained without intravenous contrast. COMPARISON:  CT studies same day. FINDINGS: Brain: Diffusion imaging does not show any acute or subacute infarction. The brainstem is normal. Chronic cerebellar atrophy. Cerebral hemispheres show old infarction in the left middle cerebral artery territory affecting the insular region and frontoparietal brain with atrophy, encephalomalacia and gliosis. Mild chronic small-vessel change affects the hemispheric white matter. No evidence of mass lesion, hemorrhage, hydrocephalus or extra-axial collection. Vascular: Major vessels at the base of the brain show flow. Skull and upper cervical spine: Negative Sinuses/Orbits: Clear/normal Other: None IMPRESSION: No acute finding by MRI. Old left MCA territory infarction with atrophy, encephalomalacia and gliosis. Electronically Signed   By: Nelson Chimes M.D.   On: 08/27/2016 16:27   Ct Head Code Stroke Wo Contrast  Result Date: 08/27/2016 CLINICAL DATA:  Code stroke. Unable to speak. History of remote CVA. EXAM: CT HEAD WITHOUT CONTRAST TECHNIQUE: Contiguous axial images were obtained from the base of the skull through the vertex without intravenous contrast. COMPARISON:  05/04/2016 FINDINGS: Brain: No evidence of acute infarction, hemorrhage, hydrocephalus, extra-axial collection or mass lesion/mass effect. Stable appearance of large remote left MCA territory infarct affecting the insula, operculum, and posterolateral frontal cortex.  Deep white matter tracts and basal ganglia were also involved. Atrophy, most notable in the cerebellum. Patient has history of alcohol abuse and seizures. Vascular: No hyperdense vessel.  Atherosclerotic calcifications. Skull: No acute or aggressive finding. There is hypoplastic clivus which contacts the odontoid tip. Sinuses/Orbits: No acute findings Other:  Prelim text page sent 08/27/2016  at 2:47 pm to Dr. Lorraine Lax. ASPECTS [facility] Stroke Program Early CT Score) - not scored in the setting of extensive chronic changes. IMPRESSION: 1. No acute finding. 2. Large remote left MCA territory infarct. 3. Atrophy, especially cerebellar. Electronically Signed   By: Monte Fantasia  M.D.   On: 08/27/2016 14:49    EKG: Independently reviewed.   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 Dilantin toxicity - Pt has a supratherapeutic dilantin level at 35 today which could likely be causing her symptoms of aphasia and altered mentation.  An acute CVA has been ruled out by imaging studies.  Neurology requests to hold dilantin and repeat levels tomorrow. Supportive care overnight.  neurochecks ordered.  I'd like to get a free phenytoin level in AM in addition to total phenytoin level.  I asked family to bring in the dropper so that we could verify that she has been getting the right amount of dilantin suspension that the neurologist had intended which is 100 mg BID.  Holding all dilantin at this time.  Neurohospitalist service consulted.  2. Seizure disorder, continue current keppra, vimpat and hold dilantin as noted above.  CBC, CMP in AM.  Seizure precautions.  EKG in AM.  3. Chronic ischemia left MCA stroke - stable per CT and MRI.   4. Tobacco Abuse - nicotine patch ordered to use in hospital.  5. Chronic expressive aphasia - hopefully will return to baseline with correction of toxic serum dilantin levels.  She is already starting to show signs of improvement per family and I observed her vocalizing when I examined her in the ED.   6. Protein calorie malnutrition - Dietitian consult requested.   DVT Prophylaxis: lovenox Code Status: full   Family Communication:   Disposition Plan: Home when stabilized   Time spent: 10 minutes   Irwin Brakeman, MD [facility] Pager [phone]  If 7PM-7AM, please contact night-coverage [email or web address] Password [email or web address] 08/27/2016, 6:00 PM

## 2016-08-27 NOTE — ED Provider Notes (Signed)
[facility] DEPT Provider Note   CSN: [id number] Arrival date & time: 08/27/16  1423  Patient seen myself upon arrival 1423 PM   History   Chief Complaint No chief complaint on file.   HPI Kizzi Overbey is a 61 y.o. female.Complaint is "nonverbal".  HPI this is a 61 year old female. She currently resides at home with family. Previous prolonged skilled nursing stay after left MCA stroke. Per her old records has expressive aphasia and marked dysarthria.  Last time known normal 1350 p.m. today. Onset of symptoms noted by family 35 10 PM.  Per her old records patient has marked aphasia. Family states that she was having a conversation at 10 minutes before too. It can after to a noted that she was "unable to speak". No family arrives with her to confirm if there was a suspicion of seizure disorder. Patient had history of alcohol use and abuse in the past, previous CVA, seizures.  Past Medical History:  Diagnosis Date  . Alcohol abuse   . B12 deficiency   . GERD (gastroesophageal reflux disease)   . Hypertension   . IBS (irritable bowel syndrome)   . Seizures ([place])   . 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 Stroke [facility])    residual right sided deficits, expressive dysphasia  . Vitamin D deficiency disease   . Vitamin E deficiency     Patient Active Problem List   Diagnosis Date Noted  . Dementia 06/07/2016  . IBS (irritable bowel syndrome) 06/07/2016  . Tobacco use 06/07/2016  . Throat pain 12/29/2015  . Seizure disorder ([place]) 02/27/2015  . Aphasia   . Malnutrition of moderate degree 10/27/2014  . Hyperlipidemia 10/15/2014  . Essential hypertension 10/14/2014  . GERD (gastroesophageal reflux disease) 10/14/2014  . Vitamin D deficiency 10/14/2014  . Vitamin B12 deficiency 10/14/2014  . Asthma 10/14/2014  . History of CVA with residual deficit 10/14/2014    No past surgical history on file.  OB History    No data available       Home Medications    Prior to  Admission medications   Medication Sig Start Date End Date Taking? Authorizing Provider  albuterol (PROVENTIL) (5 MG/ML) 0.5% nebulizer solution Take 0.5 mLs (2.5 mg total) by nebulization every 6 (six) hours as needed for wheezing or shortness of breath. 07/28/16   Aldine Contes, MD  aspirin 81 MG chewable tablet Chew 1 tablet (81 mg total) by mouth daily. 06/07/16   Dellia Nims, MD  atorvastatin (LIPITOR) 40 MG tablet Take 1 tablet (40 mg total) by mouth daily. 07/13/16   Aldine Contes, MD  cholecalciferol (VITAMIN D) 1000 units tablet Take 1,000 Units by mouth daily.    [provider]  Lacosamide (VIMPAT) 150 MG TABS Take 1 tablet (150 mg total) by mouth 2 (two) times daily. 07/13/16   Aldine Contes, MD  levETIRAcetam (KEPPRA) 750 MG tablet Take 1 tablet (750 mg total) by mouth 2 (two) times daily. 06/09/16   Penumalli, Earlean Polka, MD  linaclotide (LINZESS) 145 MCG CAPS capsule Take 1 capsule (145 mcg total) by mouth daily. 06/07/16   Dellia Nims, MD  lisinopril-hydrochlorothiazide (ZESTORETIC) 10-12.5 MG tablet Take 1 tablet by mouth daily. 06/07/16 06/06/17  Dellia Nims, MD  mirtazapine (REMERON) 7.5 MG tablet Take 1 tablet (7.5 mg total) by mouth at bedtime. 06/07/16   Dellia Nims, MD  Multiple Vitamin (MULTIVITAMIN WITH MINERALS) TABS tablet Take 1 tablet by mouth daily. 10/31/14   Iline Oven, MD  nicotine (NICODERM CQ -  DOSED IN MG/24 HOURS) 14 mg/24hr patch Place 1 patch (14 mg total) onto the skin daily. 06/07/16 06/07/17  Dellia Nims, MD  omeprazole (PRILOSEC) 20 MG capsule Take 1 capsule (20 mg total) by mouth daily. 07/28/16 07/27/17  Aldine Contes, MD  phenytoin (DILANTIN) 125 MG/5ML suspension Take 4 mLs (100 mg total) by mouth 2 (two) times daily. 06/09/16   Penumalli, Earlean Polka, MD  rivastigmine (EXELON) 1.5 MG capsule Take 1 capsule (1.5 mg total) by mouth 2 (two) times daily. 06/07/16   Dellia Nims, MD    Family History Family History  Problem Relation  Age of Onset  . Hypertension Mother   . Hypertension Father   . Kidney disease Father   . Hyperlipidemia Father   . Hypertension Sister   . Diabetes Sister     Social History Social History  Substance Use Topics  . Smoking status: Current Every Day Smoker    Packs/day: 0.25    Years: 40.00    Types: Cigarettes  . Smokeless tobacco: Never Used  . Alcohol use No     Comment: Reports not drinking now.     Allergies   Penicillins   Review of Systems Review of Systems  Unable to perform ROS: Patient nonverbal     Physical Exam Updated Vital Signs Wt 38.9 kg (85 lb 12.1 oz)   BMI 16.75 kg/m   Physical Exam  Constitutional: No distress.  Thin frail appearing  HENT:  Head: Normocephalic.  Eyes: Pupils are equal, round, and reactive to light. Conjunctivae are normal. No scleral icterus.  Neck: Normal range of motion. Neck supple. No thyromegaly present.  Cardiovascular: Normal rate and regular rhythm.  Exam reveals no gallop and no friction rub.   No murmur heard. Pulmonary/Chest: Effort normal and breath sounds normal. No respiratory distress. She has no wheezes. She has no rales.  Abdominal: Soft. Bowel sounds are normal. She exhibits no distension. There is no tenderness. There is no rebound.  Musculoskeletal: Normal range of motion.  Neurological:  Minimally to non-verbal. No obvious facial droop. Awake and alert. Decreased sensation use a right artery. Normal use and movement of left upper and left lower exudate.  Skin: Skin is warm and dry. No rash noted.  Psychiatric: She has a normal mood and affect. Her behavior is normal.     ED Treatments / Results  Labs (all labs ordered are listed, but only abnormal results are displayed) Labs Reviewed  CBC - Abnormal; Notable for the following:       Result Value   HCT 35.8 (*)    All other components within normal limits  CBG MONITORING, ED - Abnormal; Notable for the following:    Glucose-Capillary 110 (*)     All other components within normal limits  I-STAT CHEM 8, ED - Abnormal; Notable for the following:    Glucose, Bld 101 (*)    Calcium, Ion 1.05 (*)    All other components within normal limits  DIFFERENTIAL  PROTIME-INR  APTT  COMPREHENSIVE METABOLIC PANEL  ETHANOL  PHENYTOIN LEVEL, TOTAL  I-STAT TROPONIN, ED  CBG MONITORING, ED    EKG  EKG Interpretation None       Radiology Ct Head Code Stroke Wo Contrast  Result Date: 08/27/2016 CLINICAL DATA:  Code stroke. Unable to speak. History of remote CVA. EXAM: CT HEAD WITHOUT CONTRAST TECHNIQUE: Contiguous axial images were obtained from the base of the skull through the vertex without intravenous contrast. COMPARISON:  05/04/2016 FINDINGS: Brain: No  evidence of acute infarction, hemorrhage, hydrocephalus, extra-axial collection or mass lesion/mass effect. Stable appearance of large remote left MCA territory infarct affecting the insula, operculum, and posterolateral frontal cortex. Deep white matter tracts and basal ganglia were also involved. Atrophy, most notable in the cerebellum. Patient has history of alcohol abuse and seizures. Vascular: No hyperdense vessel.  Atherosclerotic calcifications. Skull: No acute or aggressive finding. There is hypoplastic clivus which contacts the odontoid tip. Sinuses/Orbits: No acute findings Other:  Prelim text page sent 08/27/2016  at 2:47 pm to Dr. Lorraine Lax. ASPECTS [facility] Stroke Program Early CT Score) - not scored in the setting of extensive chronic changes. IMPRESSION: 1. No acute finding. 2. Large remote left MCA territory infarct. 3. Atrophy, especially cerebellar. Electronically Signed   By: Monte Fantasia M.D.   On: 08/27/2016 14:49    Procedures Procedures (including critical care time)  Medications Ordered in ED Medications - No data to display   Initial Impression / Assessment and Plan / ED Course  I have reviewed the triage vital signs and the nursing notes.  Pertinent labs &  imaging results that were available during my care of the patient were reviewed by me and considered in my medical decision making (see chart for details).   patient accompanied to CT scan by myself as well as her hospitalist. CT shows no obvious hemorrhage or acute findings. Does show large MCA distribution previous infarct. Patient taking immediately to MRI compared by her hospitalist. Disposition and treatment plan will be established after completion of above studies.  Patient has had previous presentations similar to today after seizures. Meds include Vimpat, Keppra, and phenytoin. Has had previous evaluation finding out an alcohol level even when at her care facility. We'll wait for alcohol level well.  Final Clinical Impressions(s) / ED Diagnoses   Final diagnoses:  Aphasia    New Prescriptions New Prescriptions   No medications on file     Tanna Furry, MD 08/27/16 1453

## 2016-08-27 NOTE — Consult Note (Signed)
Requesting Physician: Dr. Jeneen Rinks    Chief Complaint: Not speaking  History obtained from: Chart and EMS report HPI:                                                                                                                                      Michaelah Credeur is an 61 y.o. female with a past medical history of seizure, alcohol abuse, hypertension, sickle cell disease, multiple vitamin deficiencies and stroke with residual right sided weakness, dysarthria, expressive aphasia and confusion. Per EMS report, she was found seated, leaning on a counter and unable to speak. EMS was then called for transport, at which time a code stroke was initiated, and she was brought to [facility] for further evaluation and treatment.  Per Dr. Gladstone Lighter last note in May, 2018: she was expressively aphasic, not oriented to place or time, had decreased memory, attention and concentration, decreased comprehenision and ability to name objects and dysarthia. Further, she has increased tone on the right arm and leg paired with decreased strength and slow movements on that side. Her right hand is spastic and remains in a flexed position.  Pertinent Imaging and diagnostics: CT head 08/26/16: No acute finding. Large remote left MCA infarct. Cerebellar atrophy. MR brain limited 08/26/16: Dilantin 34.6. 37.6 corrected  Date last known well: Date: 08/27/2016 Time last known well: Time: 13:50 NIHSS 10  tPA Given: No: Not a stroke  Modified Rankin Score: 3  Stroke Risk Factors - hypercoagulable state and hypertension   Past Medical History:  Diagnosis Date  . Alcohol abuse   . B12 deficiency   . GERD (gastroesophageal reflux disease)   . Hypertension   . IBS (irritable bowel syndrome)   . Seizures ([place])   . Sickle-cell/Hb-C disease ([place])   . Stroke [facility])    residual right sided deficits, expressive dysphasia  . Vitamin D deficiency disease   . Vitamin E deficiency     No past surgical history on file.  Family  History  Problem Relation Age of Onset  . Hypertension Mother   . Hypertension Father   . Kidney disease Father   . Hyperlipidemia Father   . Hypertension Sister   . Diabetes Sister      reports that she has been smoking Cigarettes.  She has a 10.00 pack-year smoking history. She has never used smokeless tobacco. She reports that she does not drink alcohol or use drugs.  Allergies  Allergen Reactions  . Penicillins Other (See Comments)    abdominal bloating Has patient had a PCN reaction causing immediate rash, facial/tongue/throat swelling, SOB or lightheadedness with hypotension: No Has patient had a PCN reaction causing severe rash involving mucus membranes or skin necrosis: No Has patient had a PCN reaction that required hospitalization No Has patient had a PCN reaction occurring within the last 10 years: No If all of the above answers are "NO", then may proceed with Cephalosporin  use.     Medications:                                                                                                                       No outpatient prescriptions have been marked as taking for the 08/27/16 encounter [facility] Encounter).    Review Of Systems:                                                                                                           History obtained from unobtainable from patient due to language barrier  Weight 38.9 kg (85 lb 12.1 oz).   Physical Examination:                                                                                                      General: WD cachetic female.  HEENT:  Normocephalic, no lesions, without obvious abnormality.  Normal external eye and conjunctiva.  Normal external ears. Normal external nose, mucus membranes and septum.  Normal pharynx. Cardiovascular: regular rate and rhythm, pulses palpable throughout   Pulmonary: Unlabored Abdomen: Soft Extremities: no joint deformities, effusion, or inflammation Musculoskeletal:  Increased tone of the right arm. Diminished bulk Skin: warm and dry, no hyperpigmentation, vitiligo, or suspicious lesions  Neurological Examination:                                                                                               Mental Status: Joei Frangos is alert, oriented, thought content appropriate. Mostly anarthric. She tries to speak and occasionally says some dysarthric words. Able to follow some simple commands without difficulty. Cranial Nerves: II: Small right visual field cut. VFF otherwise, pupils are equal, round, reactive to light. III,IV, VI: Ptosis not present, extra-ocular muscle movements grossly  bilaterally V,VII: Smile and eyebrow raise is symmetric. VIII: Hearing grossly intact IX,X: Uvula and palate rise symmetrically XI: SCM and bilateral shoulder shrug strength 5/5 XII: Midline tongue extension Motor: Right :     Upper extremity   4/5   Left:     Upper extremity   5/5          Lower extremity   4/5    Lower extremity   5/5 Pronator drift not present Sensory: Pinprick and light touch intact throughout, bilaterally Deep Tendon Reflexes: 2+ and symmetric throughout left extremities, 3+ on the right Plantars: Right: downgoing   Left: downgoing Cerebellar: Finger-to-nose test slow on the right, without evidence of dysmetria or ataxia bilaterally. Gait: Not tested  Lab Results: Basic Metabolic Panel:  Recent Labs Lab 08/27/16 1429 08/27/16 1438  NA 138 140  K 3.6 3.6  CL 105 105  CO2 23  --   GLUCOSE 104* 101*  BUN 12 16  CREATININE 0.81 0.70  CALCIUM 9.1  --     Liver Function Tests:  Recent Labs Lab 08/27/16 1429  AST 53*  ALT 36  ALKPHOS 142*  BILITOT 0.7  PROT 8.0  ALBUMIN 4.1   No results for input(s): LIPASE, AMYLASE in the last 168 hours. No results for input(s): AMMONIA in the last 168 hours.  CBC:  Recent Labs Lab 08/27/16 1429 08/27/16 1438  WBC 6.3  --   NEUTROABS 3.6  --   HGB 12.6 14.6  HCT 35.8* 43.0   MCV 85.0  --   PLT 327  --     Cardiac Enzymes: No results for input(s): CKTOTAL, CKMB, CKMBINDEX, TROPONINI in the last 168 hours.  Lipid Panel: No results for input(s): CHOL, TRIG, HDL, CHOLHDL, VLDL, LDLCALC in the last 168 hours.  CBG:  Recent Labs Lab 08/27/16 1428  GLUCAP 110*    Microbiology: Results for orders placed or performed during the hospital encounter of 07/15/15  Urine culture     Status: Abnormal   Collection Time: 07/15/15  4:20 PM  Result Value Ref Range Status   Specimen Description URINE, CLEAN CATCH  Final   Special Requests NONE  Final   Culture MULTIPLE SPECIES PRESENT, SUGGEST RECOLLECTION (A)  Final   Report Status 07/17/2015 FINAL  Final    Coagulation Studies:  Recent Labs  08/27/16 1429  LABPROT 14.2  INR 1.10    Imaging: Ct Head Code Stroke Wo Contrast  Result Date: 08/27/2016 CLINICAL DATA:  Code stroke. Unable to speak. History of remote CVA. EXAM: CT HEAD WITHOUT CONTRAST TECHNIQUE: Contiguous axial images were obtained from the base of the skull through the vertex without intravenous contrast. COMPARISON:  05/04/2016 FINDINGS: Brain: No evidence of acute infarction, hemorrhage, hydrocephalus, extra-axial collection or mass lesion/mass effect. Stable appearance of large remote left MCA territory infarct affecting the insula, operculum, and posterolateral frontal cortex. Deep white matter tracts and basal ganglia were also involved. Atrophy, most notable in the cerebellum. Patient has history of alcohol abuse and seizures. Vascular: No hyperdense vessel.  Atherosclerotic calcifications. Skull: No acute or aggressive finding. There is hypoplastic clivus which contacts the odontoid tip. Sinuses/Orbits: No acute findings Other:  Prelim text page sent 08/27/2016  at 2:47 pm to Dr. Lorraine Lax. ASPECTS [facility] Stroke Program Early CT Score) - not scored in the setting of extensive chronic changes. IMPRESSION: 1. No acute finding. 2. Large remote left  MCA territory infarct. 3. Atrophy, especially cerebellar. Electronically Signed   By: Neva Seat.D.  On: 08/27/2016 14:49     Assessment   Old Left MCA stroke Acute Aphasia H/O seizures  Ms. Charley is a 61 year old female with a history of seizure and large left MCA territory stroke who presented to [facility] for acute onset of inability to speak. Has expressive aphasia at baseline, but still communicative per EMS.  On assessment, patient appeared alert but trying to speak, severely dysarthric, almost anarthri  with attempts to speak words. STAT MRI r/o acute stroke.   Unsure of the etiology of acute inability to speak. Her Dilantin level is high, although I doubt it would not cause an abrupt speech difficulty. She was awake and alert so and following commands perfectly and so less suspicious its seizures.  Plan:  1) Hold dilantin for now. Daily dilantin levels. Keep her between 96-78LF/YB 2) Metabolic workup/correction of metabolic derangements 2) We will continue to follow  Solon Augusta PA-C [facility] [phone]  08/27/2016, 3:28 PM    NEUROHOSPITALIST ADDENDUM Seen and examined the patient this AM. Formulated plan as documented above. Recommendations as above. Will follow.  Karena Addison Kane Kusek MD [facility] [phone]  If 7pm to 7am, please call on call as listed on AMION.

## 2016-08-27 NOTE — ED Notes (Signed)
After CT and during STAT MRI, Neurologist canceled code stroke.

## 2016-08-27 NOTE — ED Triage Notes (Signed)
Received pt from home via EMS with c/o Code Stroke. Pt LSN at 1350. At 1410 pt was found by family laying across the counter. Pt was non-verbal. Per EMS pt has history of CVA with right sided deficits and aphasia. Pt being non-verbal is the only thing new.

## 2016-08-27 NOTE — ED Notes (Signed)
Pt. Ambulated to restroom with no distress.

## 2016-08-28 ENCOUNTER — Telehealth: Payer: Self-pay | Admitting: Diagnostic Neuroimaging

## 2016-08-28 DIAGNOSIS — K219 Gastro-esophageal reflux disease without esophagitis: Secondary | ICD-10-CM | POA: Diagnosis not present

## 2016-08-28 DIAGNOSIS — E785 Hyperlipidemia, unspecified: Secondary | ICD-10-CM | POA: Diagnosis not present

## 2016-08-28 DIAGNOSIS — J45909 Unspecified asthma, uncomplicated: Secondary | ICD-10-CM

## 2016-08-28 DIAGNOSIS — T420X1A Poisoning by hydantoin derivatives, accidental (unintentional), initial encounter: Secondary | ICD-10-CM | POA: Diagnosis not present

## 2016-08-28 DIAGNOSIS — G40909 Epilepsy, unspecified, not intractable, without status epilepticus: Secondary | ICD-10-CM | POA: Diagnosis not present

## 2016-08-28 DIAGNOSIS — Z720 Tobacco use: Secondary | ICD-10-CM | POA: Diagnosis not present

## 2016-08-28 DIAGNOSIS — I10 Essential (primary) hypertension: Secondary | ICD-10-CM | POA: Diagnosis not present

## 2016-08-28 DIAGNOSIS — E440 Moderate protein-calorie malnutrition: Secondary | ICD-10-CM | POA: Diagnosis not present

## 2016-08-28 DIAGNOSIS — R4701 Aphasia: Secondary | ICD-10-CM | POA: Diagnosis not present

## 2016-08-28 DIAGNOSIS — I998 Other disorder of circulatory system: Secondary | ICD-10-CM | POA: Diagnosis not present

## 2016-08-28 DIAGNOSIS — T420X5A Adverse effect of hydantoin derivatives, initial encounter: Secondary | ICD-10-CM | POA: Diagnosis not present

## 2016-08-28 DIAGNOSIS — K589 Irritable bowel syndrome without diarrhea: Secondary | ICD-10-CM

## 2016-08-28 DIAGNOSIS — I6930 Unspecified sequelae of cerebral infarction: Secondary | ICD-10-CM | POA: Diagnosis not present

## 2016-08-28 DIAGNOSIS — F0391 Unspecified dementia with behavioral disturbance: Secondary | ICD-10-CM | POA: Diagnosis not present

## 2016-08-28 LAB — COMPREHENSIVE METABOLIC PANEL
ALT: 28 U/L (ref 14–54)
AST: 37 U/L (ref 15–41)
Albumin: 3.2 g/dL — ABNORMAL LOW (ref 3.5–5.0)
Alkaline Phosphatase: 117 U/L (ref 38–126)
Anion gap: 4 — ABNORMAL LOW (ref 5–15)
BILIRUBIN TOTAL: 0.7 mg/dL (ref 0.3–1.2)
BUN: 11 mg/dL (ref 6–20)
CALCIUM: 8.3 mg/dL — AB (ref 8.9–10.3)
CO2: 28 mmol/L (ref 22–32)
Chloride: 106 mmol/L (ref 101–111)
Creatinine, Ser: 0.68 mg/dL (ref 0.44–1.00)
GFR calc Af Amer: >60 mL/min (ref >60)
GLUCOSE: 84 mg/dL (ref 65–99)
Potassium: 3.5 mmol/L (ref 3.5–5.1)
Sodium: 138 mmol/L (ref 135–145)
TOTAL PROTEIN: 6.1 g/dL — AB (ref 6.5–8.1)

## 2016-08-28 LAB — CBC
HCT: 30.7 % — ABNORMAL LOW (ref 36.0–46.0)
Hemoglobin: 10.5 g/dL — ABNORMAL LOW (ref 12.0–15.0)
MCH: 29.7 pg (ref 26.0–34.0)
MCHC: 34.2 g/dL (ref 30.0–36.0)
MCV: 86.7 fL (ref 78.0–100.0)
PLATELETS: 271 10*3/uL (ref 150–400)
RBC: 3.54 MIL/uL — ABNORMAL LOW (ref 3.87–5.11)
RDW: 13.6 % (ref 11.5–15.5)
WBC: 6 10*3/uL (ref 4.0–10.5)

## 2016-08-28 LAB — PHOSPHORUS: Phosphorus: 4.4 mg/dL (ref 2.5–4.6)

## 2016-08-28 LAB — PHENYTOIN LEVEL, TOTAL: Phenytoin Lvl: 21.6 ug/mL — ABNORMAL HIGH (ref 10.0–20.0)

## 2016-08-28 LAB — MAGNESIUM: Magnesium: 1.9 mg/dL (ref 1.7–2.4)

## 2016-08-28 LAB — HIV ANTIBODY (ROUTINE TESTING W REFLEX): HIV Screen 4th Generation wRfx: NONREACTIVE

## 2016-08-28 MED ORDER — LACOSAMIDE 50 MG PO TABS: 200.0000 mg | ORAL_TABLET | Freq: Two times a day (BID) | ORAL | Status: DC

## 2016-08-28 MED ORDER — THIAMINE HCL 100 MG PO TABS: 100.0000 mg | ORAL_TABLET | Freq: Every day | ORAL | 0 refills | Status: DC

## 2016-08-28 MED ORDER — LACOSAMIDE 200 MG PO TABS: 200.0000 mg | ORAL_TABLET | Freq: Two times a day (BID) | ORAL | 0 refills | Status: AC

## 2016-08-28 MED ORDER — HYDROCHLOROTHIAZIDE 12.5 MG PO CAPS
12.5000 mg | ORAL_CAPSULE | Freq: Every day | ORAL | Status: DC
  Filled 2016-08-28: qty 1

## 2016-08-28 MED ORDER — FOLIC ACID 1 MG PO TABS: 1.0000 mg | ORAL_TABLET | Freq: Every day | ORAL | 0 refills | Status: DC

## 2016-08-28 MED ORDER — LISINOPRIL 10 MG PO TABS
10.0000 mg | ORAL_TABLET | Freq: Every day | ORAL | Status: DC
  Administered 2016-08-28: 10 mg via ORAL
  Filled 2016-08-28: qty 1

## 2016-08-28 MED FILL — VIMPAT 200 MG TABLET: 200 | 30 days supply | Qty: 60 | Fill #0

## 2016-08-28 NOTE — Progress Notes (Signed)
Pt discharge education and instructions completed with pt and sister at bedside; both voices understanding and denies any questions. Pt IV and telemetry removed; pt sister handed her prescription for Vimpat and pt to pick up electronically sent prescription from preferred pharmacy on file. Pt transported off unit via wheelchair with sister and belongings to the side. Pt discharge home with sister to transport her home. Delia Heady RN

## 2016-08-28 NOTE — Progress Notes (Signed)
MEDICATION RELATED CONSULT NOTE - INITIAL   Pharmacy Consult for Phenytoin Indication: Seizure history  Allergies  Allergen Reactions  . Penicillins Other (See Comments)    abdominal bloating Has patient had a PCN reaction causing immediate rash, facial/tongue/throat swelling, SOB or lightheadedness with hypotension: No Has patient had a PCN reaction causing severe rash involving mucus membranes or skin necrosis: No Has patient had a PCN reaction that required hospitalization No Has patient had a PCN reaction occurring within the last 10 years: No If all of the above answers are "NO", then may proceed with Cephalosporin use.   . Nicotine Itching, Dermatitis and Rash     Labs:  Recent Labs  08/27/16 1429 08/27/16 1438 08/27/16 2228 08/28/16 0535  WBC 6.3  --  6.8 6.0  HGB 12.6 14.6 10.7* 10.5*  HCT 35.8* 43.0 30.6* 30.7*  PLT 327  --  281 271  APTT 26  --   --   --   CREATININE 0.81 0.70 0.67 0.68  MG  --   --   --  1.9  PHOS  --   --   --  4.4  ALBUMIN 4.1  --   --  3.2*  PROT 8.0  --   --  6.1*  AST 53*  --   --  37  ALT 36  --   --  28  ALKPHOS 142*  --   --  117  BILITOT 0.7  --   --  0.7   Estimated Creatinine Clearance: 43.1 mL/min (by C-G formula based on SCr of 0.68 mg/dL).  Assessment: 61 year old female on phenytoin prior to admission Level on admission elevated at 78, still high today at 21 Phenytoin dose prior to admission = 100 mg BID (suspension)  Goal of Therapy:  Goal = 10 to 20 mcg / dL  Plan:  Continue to hold phenytoin Follow up level in AM  Thank you Anette Guarneri, PharmD [phone]  08/28/2016,10:14 AM

## 2016-08-28 NOTE — Care Management Obs Status (Signed)
[place] NOTIFICATION   Patient Details  Name: Tina Sanders MRN: [id number] Date of Birth: 01/14/1955   Medicare Observation Status Notification Given:  Yes    Pollie Friar, RN 08/28/2016, 3:07 PM

## 2016-08-28 NOTE — Telephone Encounter (Signed)
Patient admitted for confusion and aphasia, found to be toxic on dilantin. Apparently caregiver may have been giving patient too much dilantin. Now dilantin held and levels gradually declining.   I recommended to discontinue dilantin(due to multiple episodes of dilantin toxicity) and increase vimpat to 200mg  twice a day. Continue levetiracetam 750mg  twice a day.  I will have patient see me or NP at [place] in 2-4 weeks.   Penni Bombard, MD 02/26/9808, 2:54 PM Certified in Neurology, Neurophysiology and Neuroimaging  [facility] [address], [place], [place] [address] [phone]

## 2016-08-28 NOTE — Progress Notes (Signed)
Patient slept throughout night, would not cooperate with RN doing assessments.  Bed alarm on, safety maintain.

## 2016-08-28 NOTE — Progress Notes (Signed)
Neurology Progress Note   Subjective:  No acute events overnight.  Patient is able to follow commands, but has expressive aphasia and some dysarthria. She is alert to place and name. She is not able to repeat the words  Of note, patient was admitted in February 2017 with elevated levels of Dilantin, and was found to be dysarthric at that time as well.    Exam: Vitals:   08/28/16 0528 08/28/16 0849  BP: 118/65 (!) 109/55  Pulse: 75 69  Resp: 18 16  Temp: 97.9 F (36.6 C) 97.9 F (36.6 C)  SpO2: 100% 100%    HEENT-  Normocephalic, no lesions, without obvious abnormality.  Normal external eye and conjunctiva.  Normal TM's bilaterally.  Normal auditory canals and external ears. Normal external nose, mucus membranes and septum.  Normal pharynx. Cardiovascular- regular rate and rhythm, S1, S2 normal, no murmur, click, rub or gallop, pulses palpable throughout   Lungs- chest clear, no wheezing, rales, normal symmetric air entry, Heart exam - S1, S2 normal, no murmur, no gallop, rate regular Abdomen- soft, non-tender; bowel sounds normal; no masses,  no organomegaly Extremities- no edema Lymph-no adenopathy palpable Musculoskeletal-no joint tenderness, deformity or swelling Skin-warm and dry, no hyperpigmentation, vitiligo, or suspicious lesions   Neuro:  MS: Able to follow commands, but able to repeat only some words. Dysarthria and anarthria present.  CN: Pupils are equal and round. They are symmetrically reactive from 3-->2 mm. EOMI without nystagmus. Facial sensation is intact to light touch. Face is symmetric at rest with normal strength and mobility. Hearing is intact to conversational voice. Palate elevates symmetrically and uvula is midline. Voice is normal in tone, pitch and quality. Bilateral SCM and trapezii are 5/5. Tongue is midline with normal bulk and mobility.   Motor: Right :     Upper extremity   4/5                              Left:     Upper extremity   5/5             Lower extremity   4/5                                          Lower extremity   5/5 Pronator drift not present Sensory: Pinprick and light touch intact throughout, bilaterally Deep Tendon Reflexes: 2+ and symmetric throughout left extremities, 3+ on the right Plantars: Right: downgoing                           Left: downgoing Cerebellar: Finger-to-nose test slow on the right, without evidence of dysmetria or ataxia bilaterally. Gait: Not tested   Pertinent Labs/Diagnostics: Results for orders placed or performed during the hospital encounter of 08/27/16 (from the past 24 hour(s))  CBG monitoring, ED     Status: Abnormal   Collection Time: 08/27/16  2:28 PM  Result Value Ref Range   Glucose-Capillary 110 (H) 65 - 99 mg/dL  Protime-INR     Status: None   Collection Time: 08/27/16  2:29 PM  Result Value Ref Range   Prothrombin Time 14.2 11.4 - 15.2 seconds   INR 1.10   APTT     Status: None   Collection Time: 08/27/16  2:29 PM  Result Value Ref  Range   aPTT 26 24 - 36 seconds  CBC     Status: Abnormal   Collection Time: 08/27/16  2:29 PM  Result Value Ref Range   WBC 6.3 4.0 - 10.5 K/uL   RBC 4.21 3.87 - 5.11 MIL/uL   Hemoglobin 12.6 12.0 - 15.0 g/dL   HCT 35.8 (L) 36.0 - 46.0 %   MCV 85.0 78.0 - 100.0 fL   MCH 29.9 26.0 - 34.0 pg   MCHC 35.2 30.0 - 36.0 g/dL   RDW 13.4 11.5 - 15.5 %   Platelets 327 150 - 400 K/uL  Differential     Status: None   Collection Time: 08/27/16  2:29 PM  Result Value Ref Range   Neutrophils Relative % 56 %   Neutro Abs 3.6 1.7 - 7.7 K/uL   Lymphocytes Relative 26 %   Lymphs Abs 1.6 0.7 - 4.0 K/uL   Monocytes Relative 14 %   Monocytes Absolute 0.9 0.1 - 1.0 K/uL   Eosinophils Relative 4 %   Eosinophils Absolute 0.2 0.0 - 0.7 K/uL   Basophils Relative 0 %   Basophils Absolute 0.0 0.0 - 0.1 K/uL  Comprehensive metabolic panel     Status: Abnormal   Collection Time: 08/27/16  2:29 PM  Result Value Ref Range   Sodium 138 135 - 145  mmol/L   Potassium 3.6 3.5 - 5.1 mmol/L   Chloride 105 101 - 111 mmol/L   CO2 23 22 - 32 mmol/L   Glucose, Bld 104 (H) 65 - 99 mg/dL   BUN 12 6 - 20 mg/dL   Creatinine, Ser 0.81 0.44 - 1.00 mg/dL   Calcium 9.1 8.9 - 10.3 mg/dL   Total Protein 8.0 6.5 - 8.1 g/dL   Albumin 4.1 3.5 - 5.0 g/dL   AST 53 (H) 15 - 41 U/L   ALT 36 14 - 54 U/L   Alkaline Phosphatase 142 (H) 38 - 126 U/L   Total Bilirubin 0.7 0.3 - 1.2 mg/dL   GFR calc non Af Amer >60 >60 mL/min   GFR calc Af Amer >60 >60 mL/min   Anion gap 10 5 - 15  I-stat troponin, ED     Status: None   Collection Time: 08/27/16  2:35 PM  Result Value Ref Range   Troponin i, poc 0.00 0.00 - 0.08 ng/mL   Comment 3          I-Stat Chem 8, ED     Status: Abnormal   Collection Time: 08/27/16  2:38 PM  Result Value Ref Range   Sodium 140 135 - 145 mmol/L   Potassium 3.6 3.5 - 5.1 mmol/L   Chloride 105 101 - 111 mmol/L   BUN 16 6 - 20 mg/dL   Creatinine, Ser 0.70 0.44 - 1.00 mg/dL   Glucose, Bld 101 (H) 65 - 99 mg/dL   Calcium, Ion 1.05 (L) 1.15 - 1.40 mmol/L   TCO2 23 0 - 100 mmol/L   Hemoglobin 14.6 12.0 - 15.0 g/dL   HCT 43.0 36.0 - 46.0 %  Ethanol     Status: None   Collection Time: 08/27/16  2:53 PM  Result Value Ref Range   Alcohol, Ethyl (B) <5 <5 mg/dL  Phenytoin level, total     Status: Abnormal   Collection Time: 08/27/16  2:54 PM  Result Value Ref Range   Phenytoin Lvl 34.6 (HH) 10.0 - 20.0 ug/mL  CBC     Status: Abnormal   Collection Time: 08/27/16  10:28 PM  Result Value Ref Range   WBC 6.8 4.0 - 10.5 K/uL   RBC 3.58 (L) 3.87 - 5.11 MIL/uL   Hemoglobin 10.7 (L) 12.0 - 15.0 g/dL   HCT 30.6 (L) 36.0 - 46.0 %   MCV 85.5 78.0 - 100.0 fL   MCH 29.9 26.0 - 34.0 pg   MCHC 35.0 30.0 - 36.0 g/dL   RDW 13.7 11.5 - 15.5 %   Platelets 281 150 - 400 K/uL  Creatinine, serum     Status: None   Collection Time: 08/27/16 10:28 PM  Result Value Ref Range   Creatinine, Ser 0.67 0.44 - 1.00 mg/dL   GFR calc non Af Amer >60 >60  mL/min   GFR calc Af Amer >60 >60 mL/min  TSH     Status: None   Collection Time: 08/27/16 10:28 PM  Result Value Ref Range   TSH 1.281 0.350 - 4.500 uIU/mL  Comprehensive metabolic panel     Status: Abnormal   Collection Time: 08/28/16  5:35 AM  Result Value Ref Range   Sodium 138 135 - 145 mmol/L   Potassium 3.5 3.5 - 5.1 mmol/L   Chloride 106 101 - 111 mmol/L   CO2 28 22 - 32 mmol/L   Glucose, Bld 84 65 - 99 mg/dL   BUN 11 6 - 20 mg/dL   Creatinine, Ser 0.68 0.44 - 1.00 mg/dL   Calcium 8.3 (L) 8.9 - 10.3 mg/dL   Total Protein 6.1 (L) 6.5 - 8.1 g/dL   Albumin 3.2 (L) 3.5 - 5.0 g/dL   AST 37 15 - 41 U/L   ALT 28 14 - 54 U/L   Alkaline Phosphatase 117 38 - 126 U/L   Total Bilirubin 0.7 0.3 - 1.2 mg/dL   GFR calc non Af Amer >60 >60 mL/min   GFR calc Af Amer >60 >60 mL/min   Anion gap 4 (L) 5 - 15  Magnesium     Status: None   Collection Time: 08/28/16  5:35 AM  Result Value Ref Range   Magnesium 1.9 1.7 - 2.4 mg/dL  Phosphorus     Status: None   Collection Time: 08/28/16  5:35 AM  Result Value Ref Range   Phosphorus 4.4 2.5 - 4.6 mg/dL  CBC     Status: Abnormal   Collection Time: 08/28/16  5:35 AM  Result Value Ref Range   WBC 6.0 4.0 - 10.5 K/uL   RBC 3.54 (L) 3.87 - 5.11 MIL/uL   Hemoglobin 10.5 (L) 12.0 - 15.0 g/dL   HCT 30.7 (L) 36.0 - 46.0 %   MCV 86.7 78.0 - 100.0 fL   MCH 29.7 26.0 - 34.0 pg   MCHC 34.2 30.0 - 36.0 g/dL   RDW 13.6 11.5 - 15.5 %   Platelets 271 150 - 400 K/uL     Impression:  61 year old patient with history of seizure, baseline expressive aphasia, HTN,  prior large MCA territory stroke with residual right sided decicits, and history of admission for dilantin toxicity,  History of alcohol abuse, B12 deficiency, who presented with acute onset of dysarthria/anarthria. CT and MRi ruled out another stroke.    Recommendations: 1) Dilantin toxicity: Obtain Daily dilantin levels  With the goal being 10-20 ug/mL. On admission, the level was 35. The  half-life of Dilantin is just over 22 hours and it is metabolized in the liver. I think it will take at least another day or two for dilantin level to be therapeutic.  Patient had a similar admission in 2017 and at that time her dilantin level was elevated also- at that time she was taking 100 mg TID, as well as several ER visits for similar reason. Continue current doses of vimpat and Keppra. Hold dilantin until level is therapeutic. Current adjusted Dilantin level 29.2 Patient will need dose titration of dilantin until her levels are therapeutic.  Currently she has a caretaker at home, who was giving her 4 dropper fulls of dilantin suspension BID- not sure how many mg are in the dropper as the caretaker was not able to verify that. The caretaker needs education on the dose needed to give.    Signed Burgess Estelle MD Internal medicine resident Neurology Team   08/28/2016, 9:12 AM  Attending addendum Agree with the history and physical and examination documented above. I helped formulate the plan as documented above. Communicated the plan to the primary hospitalist-Dr. Cassia for help with Dilantin dosing. -Seizure precautions -Please call neurology with questions.  Amie Portland, MD [facility] [phone]  If 7pm to 7am, please call on call as listed on AMION.

## 2016-08-28 NOTE — Discharge Summary (Signed)
Physician Discharge Summary  Tina Sanders NWG:[id number] DOB: 06-26-1955 DOA: 08/27/2016  PCP: Aldine Contes, MD  Admit date: 08/27/2016 Discharge date: 08/28/2016  Admitted From: Home Disposition:  Home  Recommendations for Outpatient Follow-up:  1. Follow up with PCP in 1-2 weeks 2. Follow up with Neurology Dr. Leta Baptist in 2-4 weeks 3. Stop Dilantin altogether as Vimpat was increased 4. Please obtain CMP/CBC, Mag, Phos in one week  Home Health: No Equipment/Devices: None    Discharge Condition: Stable CODE STATUS: FULL CODE Diet recommendation: Heart Healthy Diet  Brief/Interim Summary: Tina Sanders is a 61 y.o. 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  Pt resides at home and family brought to ED because patient had become completely nonverbal over the course of the day.  She had been at baseline earlier in the day.  Her baseline is reported that she has marked aphasia and dysarthria at baseline after the large CVA.  The patient also has a history of substance abuse and chronic tobacco abuse.  Her caretaker says that she has been giving her 4 dropper fulls of dilantin suspension BID.  She didn't bring the dropper so we could verify the amount per dropper.  The patient is prescribed to take 4 ml BID of the 125 mg /5 ml strength dilantin solution which is 100 mg BID. Patient arrived to ED from home and Code Stroke was called. CT and MRI did not show acute findings and code stroke was canceled.  Her serum dilantin level was noted to be markedly elevated at 35.  The neurology service recommended that the dilantin be held and that patient be observed for repeat serum dilantin levels.  Of note, 3 months ago the serum dilantin level was also 35 and unsure of what interventions were taken at that time but family reports that the dilantin dose was reduced at that time to 100 mg BID by her neurologist. Repeat Dilantin level was 21. Inpatient  Neurology recommended evaluating until Dilantin level became therapeutic and having pharmacy adjust medication dosing, but I spoke with patient's Outpatient Neurologist who recommended Discontinuing Dilantin altogether and increasing Vimpat dose. Patient was deemed medically stable as symptoms improved significantly. She will need to follow up with PCP and outpatient Neurology within 2-4 weeks for further medication adjustments.  Discharge Diagnoses:  Principal Problem:   Dilantin toxicity Active Problems:   Essential hypertension   GERD (gastroesophageal reflux disease)   Vitamin D deficiency   Vitamin B12 deficiency   Asthma   History of CVA with residual deficit   Hyperlipidemia   Malnutrition of moderate degree   Aphasia   Seizure disorder (HCC)   Dementia   IBS (irritable bowel syndrome)   Tobacco use   Sickle-cell/Hb-C disease (HCC)  Dilantin toxicity - Pt has a supratherapeutic dilantin level at 35 which could likely be causing her symptoms of aphasia and altered mentation.  An acute CVA has been ruled out by imaging studies.  Neurology requestedto hold dilantin and repeat levels. Repeat Level was 21. Supportive care overnight and neurochecks were ordered.  I'd like to get a free phenytoin level in AM in addition to total phenytoin level. Neurohospitalist recommending waiting until dilantin level was therapeutic and adjusting dilantin dose with Pharmacy's help but after discussion with outpatient Neurologist will D/C Dilantin altogether and increase Vimpat. Patient to follow up with PCP and Outpatient Neurology in 2-4 weeks.   Seizure disorder  -continue current keppra, increased vimpat and stoped dilantin as noted  above.  Seizure precautions and outpatient Neuro follow up  Chronic ischemia left MCA stroke - stable per CT and MRI.    Tobacco Abuse - nicotine patch ordered to use in hospital.   Chronic expressive aphasia - appeared to be at baseline  Protein calorie malnutrition  - Dietitian consult requested. Can be followed as an outpatient   Discharge Instructions  Discharge Instructions    Call MD for:  difficulty breathing, headache or visual disturbances    Complete by:  As directed    Call MD for:  extreme fatigue    Complete by:  As directed    Call MD for:  hives    Complete by:  As directed    Call MD for:  persistant dizziness or light-headedness    Complete by:  As directed    Call MD for:  persistant nausea and vomiting    Complete by:  As directed    Call MD for:  redness, tenderness, or signs of infection (pain, swelling, redness, odor or green/yellow discharge around incision site)    Complete by:  As directed    Call MD for:  severe uncontrolled pain    Complete by:  As directed    Call MD for:  temperature >100.4    Complete by:  As directed    Diet - low sodium heart healthy    Complete by:  As directed    Discharge instructions    Complete by:  As directed    Follow up with PCP and with Neurology Dr. Leta Baptist as an outpatient. Take all medications as prescribed. If symptoms change or worsen please return to the ED for evaluation .   Increase activity slowly    Complete by:  As directed      Allergies as of 08/28/2016      Reactions   Penicillins Other (See Comments)   abdominal bloating Has patient had a PCN reaction causing immediate rash, facial/tongue/throat swelling, SOB or lightheadedness with hypotension: No Has patient had a PCN reaction causing severe rash involving mucus membranes or skin necrosis: No Has patient had a PCN reaction that required hospitalization No Has patient had a PCN reaction occurring within the last 10 years: No If all of the above answers are "NO", then may proceed with Cephalosporin use.   Nicotine Itching, Dermatitis, Rash      Medication List    STOP taking these medications   phenytoin 125 MG/5ML suspension Commonly known as:  DILANTIN     TAKE these medications   albuterol (5 MG/ML) 0.5%  nebulizer solution Commonly known as:  PROVENTIL Take 0.5 mLs (2.5 mg total) by nebulization every 6 (six) hours as needed for wheezing or shortness of breath.   aspirin 81 MG chewable tablet Chew 1 tablet (81 mg total) by mouth daily.   atorvastatin 40 MG tablet Commonly known as:  LIPITOR Take 1 tablet (40 mg total) by mouth daily.   cholecalciferol 1000 units tablet Commonly known as:  VITAMIN D Take 1,000 Units by mouth daily.   folic acid 1 MG tablet Commonly known as:  FOLVITE Take 1 tablet (1 mg total) by mouth daily.   lacosamide 200 MG Tabs tablet Commonly known as:  VIMPAT Take 1 tablet (200 mg total) by mouth 2 (two) times daily. What changed:  medication strength  how much to take   levETIRAcetam 750 MG tablet Commonly known as:  KEPPRA Take 1 tablet (750 mg total) by mouth 2 (two) times daily.  linaclotide 145 MCG Caps capsule Commonly known as:  LINZESS Take 1 capsule (145 mcg total) by mouth daily.   lisinopril-hydrochlorothiazide 10-12.5 MG tablet Commonly known as:  ZESTORETIC Take 1 tablet by mouth daily.   mirtazapine 7.5 MG tablet Commonly known as:  REMERON Take 1 tablet (7.5 mg total) by mouth at bedtime.   multivitamin with minerals Tabs tablet Take 1 tablet by mouth daily.   nicotine 14 mg/24hr patch Commonly known as:  NICODERM CQ - dosed in mg/24 hours Place 1 patch (14 mg total) onto the skin daily.   omeprazole 20 MG capsule Commonly known as:  PRILOSEC Take 1 capsule (20 mg total) by mouth daily.   rivastigmine 1.5 MG capsule Commonly known as:  EXELON Take 1 capsule (1.5 mg total) by mouth 2 (two) times daily.   thiamine 100 MG tablet Take 1 tablet (100 mg total) by mouth daily.       Allergies  Allergen Reactions  . Penicillins Other (See Comments)    abdominal bloating Has patient had a PCN reaction causing immediate rash, facial/tongue/throat swelling, SOB or lightheadedness with hypotension: No Has patient had a  PCN reaction causing severe rash involving mucus membranes or skin necrosis: No Has patient had a PCN reaction that required hospitalization No Has patient had a PCN reaction occurring within the last 10 years: No If all of the above answers are "NO", then may proceed with Cephalosporin use.   . Nicotine Itching, Dermatitis and Rash    Consultations:  Neurology  Procedures/Studies: Mr Brain Wo Contrast  Result Date: 08/27/2016 CLINICAL DATA:  Expressive aphasia. No acute finding by CT earlier today. EXAM: MRI HEAD WITHOUT CONTRAST TECHNIQUE: Multiplanar, multiecho pulse sequences of the brain and surrounding structures were obtained without intravenous contrast. COMPARISON:  CT studies same day. FINDINGS: Brain: Diffusion imaging does not show any acute or subacute infarction. The brainstem is normal. Chronic cerebellar atrophy. Cerebral hemispheres show old infarction in the left middle cerebral artery territory affecting the insular region and frontoparietal brain with atrophy, encephalomalacia and gliosis. Mild chronic small-vessel change affects the hemispheric white matter. No evidence of mass lesion, hemorrhage, hydrocephalus or extra-axial collection. Vascular: Major vessels at the base of the brain show flow. Skull and upper cervical spine: Negative Sinuses/Orbits: Clear/normal Other: None IMPRESSION: No acute finding by MRI. Old left MCA territory infarction with atrophy, encephalomalacia and gliosis. Electronically Signed   By: Nelson Chimes M.D.   On: 08/27/2016 16:27   Ct Head Code Stroke Wo Contrast  Result Date: 08/27/2016 CLINICAL DATA:  Code stroke. Unable to speak. History of remote CVA. EXAM: CT HEAD WITHOUT CONTRAST TECHNIQUE: Contiguous axial images were obtained from the base of the skull through the vertex without intravenous contrast. COMPARISON:  05/04/2016 FINDINGS: Brain: No evidence of acute infarction, hemorrhage, hydrocephalus, extra-axial collection or mass lesion/mass  effect. Stable appearance of large remote left MCA territory infarct affecting the insula, operculum, and posterolateral frontal cortex. Deep white matter tracts and basal ganglia were also involved. Atrophy, most notable in the cerebellum. Patient has history of alcohol abuse and seizures. Vascular: No hyperdense vessel.  Atherosclerotic calcifications. Skull: No acute or aggressive finding. There is hypoplastic clivus which contacts the odontoid tip. Sinuses/Orbits: No acute findings Other:  Prelim text page sent 08/27/2016  at 2:47 pm to Dr. Lorraine Lax. ASPECTS [facility] Stroke Program Early CT Score) - not scored in the setting of extensive chronic changes. IMPRESSION: 1. No acute finding. 2. Large remote left MCA territory infarct. 3.  Atrophy, especially cerebellar. Electronically Signed   By: Monte Fantasia M.D.   On: 08/27/2016 14:49     Subjective: Seen and examined at bedside and was improved. Wanted to go home. No complaints.  Discharge Exam: Vitals:   08/28/16 1110 08/28/16 1347  BP: (!) 110/59 (!) 96/59  Pulse: 70 69  Resp: 18 16  Temp: 97.9 F (36.6 C) 97.7 F (36.5 C)  SpO2: 100% 100%   Vitals:   08/28/16 0528 08/28/16 0849 08/28/16 1110 08/28/16 1347  BP: 118/65 (!) 109/55 (!) 110/59 (!) 96/59  Pulse: 75 69 70 69  Resp: 18 16 18 16   Temp: 97.9 F (36.6 C) 97.9 F (36.6 C) 97.9 F (36.6 C) 97.7 F (36.5 C)  TempSrc: Oral Oral Oral Oral  SpO2: 100% 100% 100% 100%  Weight:       General: Pt is alert, awake, not in acute distress; Has some dysarthria and expressive aphasia Cardiovascular: RRR, S1/S2 +, no rubs, no gallops Respiratory: CTA bilaterally, no wheezing, no rhonchi Abdominal: Soft, NT, ND, bowel sounds + Extremities: no edema, no cyanosis  The results of significant diagnostics from this hospitalization (including imaging, microbiology, ancillary and laboratory) are listed below for reference.    Microbiology: No results found for this or any previous visit  (from the past 240 hour(s)).   Labs: BNP (last 3 results) No results for input(s): BNP in the last 8760 hours. Basic Metabolic Panel:  Recent Labs Lab 08/27/16 1429 08/27/16 1438 08/27/16 2228 08/28/16 0535  NA 138 140  --  138  K 3.6 3.6  --  3.5  CL 105 105  --  106  CO2 23  --   --  28  GLUCOSE 104* 101*  --  84  BUN 12 16  --  11  CREATININE 0.81 0.70 0.67 0.68  CALCIUM 9.1  --   --  8.3*  MG  --   --   --  1.9  PHOS  --   --   --  4.4   Liver Function Tests:  Recent Labs Lab 08/27/16 1429 08/28/16 0535  AST 53* 37  ALT 36 28  ALKPHOS 142* 117  BILITOT 0.7 0.7  PROT 8.0 6.1*  ALBUMIN 4.1 3.2*   No results for input(s): LIPASE, AMYLASE in the last 168 hours. No results for input(s): AMMONIA in the last 168 hours. CBC:  Recent Labs Lab 08/27/16 1429 08/27/16 1438 08/27/16 2228 08/28/16 0535  WBC 6.3  --  6.8 6.0  NEUTROABS 3.6  --   --   --   HGB 12.6 14.6 10.7* 10.5*  HCT 35.8* 43.0 30.6* 30.7*  MCV 85.0  --  85.5 86.7  PLT 327  --  281 271   Cardiac Enzymes: No results for input(s): CKTOTAL, CKMB, CKMBINDEX, TROPONINI in the last 168 hours. BNP: Invalid input(s): POCBNP CBG:  Recent Labs Lab 08/27/16 1428  GLUCAP 110*   D-Dimer No results for input(s): DDIMER in the last 72 hours. Hgb A1c No results for input(s): HGBA1C in the last 72 hours. Lipid Profile No results for input(s): CHOL, HDL, LDLCALC, TRIG, CHOLHDL, LDLDIRECT in the last 72 hours. Thyroid function studies  Recent Labs  08/27/16 2228  TSH 1.281   Anemia work up No results for input(s): VITAMINB12, FOLATE, FERRITIN, TIBC, IRON, RETICCTPCT in the last 72 hours. Urinalysis    Component Value Date/Time   COLORURINE YELLOW 05/04/2016 1415   APPEARANCEUR HAZY (A) 05/04/2016 1415   LABSPEC 1.010 05/04/2016  [place] 5.0 05/04/2016 [place] 05/04/2016 [place] 05/04/2016 [place] 05/04/2016 1415   KETONESUR NEGATIVE  05/04/2016 1415   PROTEINUR NEGATIVE 05/04/2016 1415   UROBILINOGEN 0.2 11/09/2014 1214   NITRITE NEGATIVE 05/04/2016 1415   LEUKOCYTESUR NEGATIVE 05/04/2016 1415   Sepsis Labs Invalid input(s): PROCALCITONIN,  WBC,  LACTICIDVEN Microbiology No results found for this or any previous visit (from the past 240 hour(s)).  Time coordinating discharge: 35 minutes  SIGNED:  Kerney Elbe, DO [facility] 08/28/2016, 2:29 PM Pager [phone]  If 7PM-7AM, please contact night-coverage [email or web address] Password [email or web address]

## 2016-08-28 NOTE — Progress Notes (Signed)
Received patient from ED via stretcher. Ambulated to bathroom with two assist. Unsteady gait with a limp.  Yellow socks, armband and bed alarm on.  Patient being admitted for supratherapeutic serum dilantin level. Patient is reportedly at baseline with aphasic. Per ED report family found patient to be unable to speak with dysarthria. At the time of arrival to floor, reportedly, she is at her baseline. She is a small lady, skin intact. Allergy to PCN. Armband applied.  She is on telebox 5C12 with continuous pulse oximeter. Very difficult to understand her and she was not completely cooperative. NS at 60cc/hr was started into an 18 gauge IV L forearm. Will continue to monitor and assess for needs.

## 2016-08-28 NOTE — Telephone Encounter (Addendum)
[facility] to schedule patient's hospital FU per Dr Leta Baptist. [facility] stated patient is no longer a resident there.  Called patient's sister, LVM requesting a call back to schedule paitent for a follow up in 2-4 weeks per Dr Leta Baptist.

## 2016-08-28 NOTE — Progress Notes (Signed)
Pt discharging home with her sister who is her primary caretaker. Pt does have an aide 2 hours a day.  Per patients sister, patient is to [facility] going to [facility] the first week of September.  Sister is to provide transportation home today.

## 2016-08-29 LAB — PHENYTOIN LEVEL, FREE AND TOTAL
Phenytoin, Free: 2 ug/mL (ref 1.0–2.0)
Phenytoin, Total: 22 ug/mL — ABNORMAL HIGH (ref 10.0–20.0)

## 2016-08-29 NOTE — Telephone Encounter (Signed)
LVM for sister requesting she call back to schedule patient's hospital FU in 2-4 weeks with Dr Leta Baptist or NP.

## 2016-09-01 ENCOUNTER — Ambulatory Visit: Payer: Medicare Other | Admitting: Diagnostic Neuroimaging

## 2016-09-04 NOTE — Telephone Encounter (Signed)
LVM with number for sister, Blanch Media requesting she call back and schedule patient to be seen by Dr Leta Baptist in 2-4 weeks, following hospitalization, per Dr Leta Baptist.

## 2016-09-05 NOTE — Telephone Encounter (Addendum)
Scheduled patient for 4 week hospital follow up, will call sister and LVM if cannot get her on phone. LVM for sister advising her of follow up appt, and left office number should she need to reschedule.

## 2016-09-25 ENCOUNTER — Ambulatory Visit: Payer: Self-pay | Admitting: Diagnostic Neuroimaging

## 2016-10-23 ENCOUNTER — Other Ambulatory Visit: Payer: Self-pay | Admitting: Internal Medicine

## 2016-10-23 DIAGNOSIS — I6930 Unspecified sequelae of cerebral infarction: Secondary | ICD-10-CM

## 2016-10-24 NOTE — Telephone Encounter (Signed)
Pls sch PCP appt (first available seems Feb) HTN F/U.BP well controlled

## 2016-11-15 ENCOUNTER — Ambulatory Visit (INDEPENDENT_AMBULATORY_CARE_PROVIDER_SITE_OTHER): Payer: No Typology Code available for payment source | Admitting: Diagnostic Neuroimaging

## 2016-11-15 ENCOUNTER — Encounter: Payer: Self-pay | Admitting: Diagnostic Neuroimaging

## 2016-11-15 VITALS — BP 117/74 | HR 79 | Ht 60.0 in | Wt 95.8 lb

## 2016-11-15 DIAGNOSIS — G40909 Epilepsy, unspecified, not intractable, without status epilepticus: Secondary | ICD-10-CM | POA: Diagnosis not present

## 2016-11-15 DIAGNOSIS — I6930 Unspecified sequelae of cerebral infarction: Secondary | ICD-10-CM

## 2016-11-15 DIAGNOSIS — R4701 Aphasia: Secondary | ICD-10-CM | POA: Diagnosis not present

## 2016-11-15 NOTE — Progress Notes (Signed)
[facility]  PATIENT: Tina Sanders DOB: 1955/08/17  REFERRING CLINICIAN: Lesia Hausen, PA-c HISTORY FROM: patient and chart review REASON FOR VISIT: follow up   HISTORICAL  CHIEF COMPLAINT:  Chief Complaint  Patient presents with  . Follow-up  . Hx Stroke    Pt unsure if has had sz.  exelon patch caused rash.   Pharmacy call [facility]  . Seizures    dilantin toxicity ED 08-28-16.  On vimpat and keppra now, no dilantin. Lives with sister Blanch Media [phone] who is not with pt today.  She attends [facility].    Marland Kitchen Nicotine Dependence    HISTORY OF PRESENT ILLNESS:   UPDATE (11/15/16, VRP): Since last visit, doing well now. In August 2018, was admitted for confusion and aphasia, found to be toxic on dilantin. Dilantin stopped, and vimpat increased to 200mg  twice a day. Levetiracetam continued at 750mg  twice a day. Since then, no further seizures.   UPDATE 05/19/16: Since last visit, patient went to ER x 2 in April 2018, for confusion spells and possible seizures, on 04/30/16 and 05/04/16. The 2nd ER visit, dilantin level was toxic at 35.7. Dilantin dosing was reduced. Now patient back to baseline.   UPDATE 03/31/16: Since last visit, no seizures. Tolerating meds. No new neuro issues.   PRIOR HPI (10/01/15): 61 year old female here for evaluation of seizure disorder. Patient arrives with transport/caregiver from her assisted living facility. I reviewed referring provider notes, Emergency room notes, imaging and lab results. Summary patient has history of left frontal ischemic infarction many years ago (date not exactly known) with subsequent seizure disorder. Apparently she has history of chronic alcohol abuse as well. Unfortunately patient, caregiver and facility or not able to provide me any additional information. No one is sure when her seizures for started. Apparently her last seizure was 23 months ago but no one can give me a description of the event. Patient is currently  on 3 antiseizure medications including levetiracetam, Vimpat, Dilantin. Patient has a family member who lives in [place] is not available at this office visit today or by phone.   REVIEW OF SYSTEMS: Full 14 system review of systems performed and negative with exception of: only as per HPI.   ALLERGIES: Allergies  Allergen Reactions  . Penicillins Other (See Comments)    abdominal bloating Has patient had a PCN reaction causing immediate rash, facial/tongue/throat swelling, SOB or lightheadedness with hypotension: No Has patient had a PCN reaction causing severe rash involving mucus membranes or skin necrosis: No Has patient had a PCN reaction that required hospitalization No Has patient had a PCN reaction occurring within the last 10 years: No If all of the above answers are "NO", then may proceed with Cephalosporin use.   . Nicotine Itching, Dermatitis and Rash    HOME MEDICATIONS: Outpatient Medications Prior to Visit  Medication Sig Dispense Refill  . acetaminophen (TYLENOL) 500 MG tablet Take 500-1,000 mg every 6 (six) hours as needed by mouth.    . ENSURE PLUS (ENSURE PLUS) LIQD Take 1 Can 2 (two) times daily between meals by mouth.    . lacosamide (VIMPAT) 200 MG TABS tablet Take 1 tablet (200 mg total) by mouth 2 (two) times daily. 60 tablet 0  . levETIRAcetam (KEPPRA) 750 MG tablet Take 1 tablet (750 mg total) by mouth 2 (two) times daily. 180 tablet 3  . lisinopril-hydrochlorothiazide (ZESTORETIC) 10-12.5 MG tablet Take 1 tablet by mouth daily. 90 tablet 3  . Menthol, Topical Analgesic, (BIOFREEZE EX) Apply  topically. Uses BID prn    . mirtazapine (REMERON) 7.5 MG tablet Take 1 tablet (7.5 mg total) by mouth at bedtime. (Patient taking differently: Take 15 mg at bedtime by mouth. ) 30 tablet 2  . rivastigmine (EXELON) 1.5 MG capsule Take 1 capsule (1.5 mg total) by mouth 2 (two) times daily. (Patient not taking: Reported on 11/15/2016) 60 capsule 2  . albuterol (PROVENTIL)  (5 MG/ML) 0.5% nebulizer solution Take 0.5 mLs (2.5 mg total) by nebulization every 6 (six) hours as needed for wheezing or shortness of breath. (Patient not taking: Reported on 11/15/2016) 20 mL 12  . aspirin 81 MG chewable tablet Chew 1 tablet (81 mg total) by mouth daily. (Patient not taking: Reported on 11/15/2016) 90 tablet 1  . atorvastatin (LIPITOR) 40 MG tablet Take 1 tablet (40 mg total) by mouth daily. (Patient not taking: Reported on 11/15/2016) 30 tablet 11  . cholecalciferol (VITAMIN D) 1000 units tablet Take 1,000 Units by mouth daily.    . folic acid (FOLVITE) 1 MG tablet Take 1 tablet (1 mg total) by mouth daily. (Patient not taking: Reported on 11/15/2016) 30 tablet 0  . linaclotide (LINZESS) 145 MCG CAPS capsule Take 1 capsule (145 mcg total) by mouth daily. (Patient not taking: Reported on 11/15/2016) 90 capsule 3  . Multiple Vitamin (MULTIVITAMIN WITH MINERALS) TABS tablet Take 1 tablet by mouth daily. (Patient not taking: Reported on 11/15/2016)    . nicotine (NICODERM CQ - DOSED IN MG/24 HOURS) 14 mg/24hr patch Place 1 patch (14 mg total) onto the skin daily. (Patient not taking: Reported on 11/15/2016) 30 patch 0  . omeprazole (PRILOSEC) 20 MG capsule Take 1 capsule (20 mg total) by mouth daily. (Patient not taking: Reported on 11/15/2016) 90 capsule 3  . thiamine 100 MG tablet Take 1 tablet (100 mg total) by mouth daily. (Patient not taking: Reported on 11/15/2016) 30 tablet 0   No facility-administered medications prior to visit.     PAST MEDICAL HISTORY: Past Medical History:  Diagnosis Date  . Alcohol abuse   . B12 deficiency   . GERD (gastroesophageal reflux disease)   . Hypertension   . IBS (irritable bowel syndrome)   . Seizures ([place])   . 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 Stroke [facility])    residual right sided deficits, expressive dysphasia  . Vitamin D deficiency disease   . Vitamin E deficiency     PAST SURGICAL HISTORY: No past surgical history on file.  FAMILY  HISTORY: Family History  Problem Relation Age of Onset  . Hypertension Mother   . Hypertension Father   . Kidney disease Father   . Hyperlipidemia Father   . Hypertension Sister   . Diabetes Sister     SOCIAL HISTORY:  Social History   Socioeconomic History  . Marital status: Single    Spouse name: Not on file  . Number of children: 0  . Years of education: HS  . Highest education level: Not on file  Social Needs  . Financial resource strain: Not on file  . Food insecurity - worry: Not on file  . Food insecurity - inability: Not on file  . Transportation needs - medical: Not on file  . Transportation needs - non-medical: Not on file  Occupational History  . Occupation: Disabled  Tobacco Use  . Smoking status: Current Some Day Smoker    Packs/day: 0.25    Years: 40.00    Pack years: 10.00    Types: Cigarettes  .  Smokeless tobacco: Never Used  Substance and Sexual Activity  . Alcohol use: No  . Drug use: No  . Sexual activity: Not on file  Other Topics Concern  . Not on file  Social History Narrative   Lives at home with daughter, Blanch Media.  Attends [facility].     Right-handed.   No caffeine use.     PHYSICAL EXAM  GENERAL EXAM/CONSTITUTIONAL: Vitals:  Vitals:   11/15/16 0829  BP: 117/74  Pulse: 79  Weight: 95 lb 12.8 oz (43.5 kg)  Height: 5' (1.524 m)   Wt Readings from Last 3 Encounters:  11/15/16 95 lb 12.8 oz (43.5 kg)  08/28/16 83 lb 9.6 oz (37.9 kg)  06/07/16 90 lb (40.8 kg)   Body mass index is 18.71 kg/m. No exam data present  Patient is in no distress; well developed, nourished and groomed; neck is supple  CARDIOVASCULAR:  Examination of carotid arteries is normal; no carotid bruits  Regular rate and rhythm, no murmurs  Examination of peripheral vascular system by observation and palpation is normal  POOR DENTITION  THIN, FRAIL APPEARING  EYES:  Ophthalmoscopic exam of optic discs and posterior segments is normal; no papilledema or  hemorrhages  MUSCULOSKELETAL:  Gait, strength, tone, movements noted in Neurologic exam below  NEUROLOGIC: MENTAL STATUS:  No flowsheet data found.  EXPRESSIVE APHASIA  awake, alert, oriented to self; NOT PLACE OR TIME; ABLE TO TELL ME HER NAME  DECR memory   DECRE attention and concentration  DECR FLUENCY, DECR COMPREHENSION, DECR NAMING   DECR fund of knowledge  CRANIAL NERVE:   2nd - no papilledema on fundoscopic exam  2nd, 3rd, 4th, 6th - pupils equal and reactive to light, visual fields full to confrontation, extraocular muscles intact, no nystagmus  5th - facial sensation symmetric  7th - facial strength symmetric  8th - hearing intact  9th - palate elevates symmetrically, uvula midline  11th - shoulder shrug symmetric  12th - tongue protrusion midline  MOD-SEVERE DYSARTHRIA  MOTOR:   normal bulk  INCREASED TONE ON RIGHT ARM AND RIGHT LEG  DECR RUE AND RLE (4/5)  SLOW MOVEMENTS ON RIGHT SIDE  RIGHT HAND FLEXED  SENSORY:   normal and symmetric to light touch and symm  COORDINATION:   finger-nose-finger, fine finger movements SLOW ON RIGHT SIDE  REFLEXES:   deep tendon reflexes present; SLIGHTLY BRISK ON RIGHT ARM AND RIGHT KNEE  GAIT/STATION:   narrow based gait; able to walk on toes, heels and tandem; romberg is negative    DIAGNOSTIC DATA (LABS, IMAGING, TESTING) - I reviewed patient records, labs, notes, testing and imaging myself where available.  Lab Results  Component Value Date   WBC 6.0 08/28/2016   HGB 10.5 (L) 08/28/2016   HCT 30.7 (L) 08/28/2016   MCV 86.7 08/28/2016   PLT 271 08/28/2016      Component Value Date/Time   NA 138 08/28/2016 0535   NA 136 11/13/2014 1018   K 3.5 08/28/2016 0535   CL 106 08/28/2016 0535   CO2 28 08/28/2016 0535   GLUCOSE 84 08/28/2016 0535   BUN 11 08/28/2016 0535   BUN 10 11/13/2014 1018   CREATININE 0.68 08/28/2016 0535   CALCIUM 8.3 (L) 08/28/2016 0535   PROT 6.1 (L)  08/28/2016 0535   PROT 7.3 11/13/2014 1018   ALBUMIN 3.2 (L) 08/28/2016 0535   ALBUMIN 4.0 11/13/2014 1018   AST 37 08/28/2016 0535   ALT 28 08/28/2016 0535   ALKPHOS 117 08/28/2016  0535   BILITOT 0.7 08/28/2016 0535   BILITOT 0.3 11/13/2014 1018   GFRNONAA >60 08/28/2016 0535   GFRAA >60 08/28/2016 0535   Lab Results  Component Value Date   CHOL 213 (H) 10/14/2014   HDL 70 10/14/2014   LDLCALC 132 (H) 10/14/2014   TRIG 57 10/14/2014   CHOLHDL 3.0 10/14/2014   Lab Results  Component Value Date   HGBA1C <4.0 (L) 10/26/2014   Lab Results  Component Value Date   VITAMINB12 1,459 (H) 10/26/2014   Lab Results  Component Value Date   TSH 1.281 08/27/2016   Lab Results  Component Value Date   PHENYTOIN 21.6 (H) 08/28/2016   07/15/15 CT head [I reviewed images myself and agree with interpretation. -VRP]  1. No evidence of acute intracranial abnormality. 2. Chronic left MCA infarct. 3. No acute osseous abnormality identified in the cervical spine. 4. Mild platybasia, mild basilar invagination, and mild cervical spondylosis.  03/02/15 EEG - This awake EEG is abnormal due to focal slowing over the left temporal region.     ASSESSMENT AND PLAN  61 y.o. year old female here with history of chronic left MCA ischemic infarction, subsequent expressive aphasia and dysarthria with right hemiparesis, and seizure disorder. Also has history of alcohol and cocaine abuse, now in remission.   Dx:  1. Seizure disorder ([place])   2. Chronic ischemic left MCA stroke   3. Expressive aphasia      PLAN:  I spent 15 minutes of face to face time with patient. Greater than 50% of time was spent in counseling and coordination of care with patient. In summary we discussed:   - continue current meds (levetiracetam 750mg  BID, vimpat 200mg  BID) - annual labs (CBC, CMP) per PCP  Return in about 1 year (around 11/15/2017) for with NP.   Penni Bombard, MD 11/17/4172, 0:81 AM Certified  in Neurology, Neurophysiology and Neuroimaging  [facility] [address], [place], [place] [address] [phone]

## 2016-11-15 NOTE — Addendum Note (Signed)
Addended by: Brandon Melnick on: 11/15/2016 09:42 AM   Modules accepted: Orders

## 2016-12-28 ENCOUNTER — Ambulatory Visit (INDEPENDENT_AMBULATORY_CARE_PROVIDER_SITE_OTHER): Payer: No Typology Code available for payment source | Admitting: Orthopedic Surgery

## 2016-12-28 ENCOUNTER — Encounter (INDEPENDENT_AMBULATORY_CARE_PROVIDER_SITE_OTHER): Payer: Self-pay | Admitting: Orthopedic Surgery

## 2016-12-28 ENCOUNTER — Ambulatory Visit (INDEPENDENT_AMBULATORY_CARE_PROVIDER_SITE_OTHER): Payer: No Typology Code available for payment source

## 2016-12-28 DIAGNOSIS — M79674 Pain in right toe(s): Secondary | ICD-10-CM | POA: Diagnosis not present

## 2016-12-28 DIAGNOSIS — M7989 Other specified soft tissue disorders: Secondary | ICD-10-CM

## 2016-12-28 DIAGNOSIS — I6930 Unspecified sequelae of cerebral infarction: Secondary | ICD-10-CM | POA: Diagnosis not present

## 2016-12-28 NOTE — Progress Notes (Signed)
Office Visit Note   Patient: Tina Sanders           Date of Birth: 11-Sep-1955           MRN: [id number] Visit Date: 12/28/2016              Requested by: Aldine Contes, MD [address] [place], [place] [id number] PCP: Aldine Contes, MD  Chief Complaint  Patient presents with  . Right Foot - Pain, Edema      HPI: Patient is a 61 year old woman who is about 4 years status post a stroke involving the right lower extremity.  Patient does have other deficits secondary to the stroke.  Patient complains of pain around the great toe MTP joint.  Assessment & Plan: Visit Diagnoses:  1. Pain and swelling of toe of right foot   2. History of CVA with residual deficit     Plan: Reviewed with patient and there is no destructive changes of the MTP joint no clinical signs of gout.  She does have flaccid paralysis of the right lower extremity and discussed the importance of wearing her posterior ankle foot orthosis with her extra-depth shoes.  There is no evidence of any pressure from the shoe wear.  Discussed the importance of using the stability of the AFO to allow for ambulation.  Follow-Up Instructions: Return if symptoms worsen or fail to improve.   Ortho Exam  Patient is alert, oriented, no adenopathy, well-dressed, normal affect, normal respiratory effort. Examination patient has a good dorsalis pedis and posterior tibial pulse she has good range of motion of the ankle and subtalar joint.  The MTP joint has pain-free range of motion there is no tenderness to palpation there is no evidence of a paronychial infection.  Patient has significant motor weakness.  She has no knee active extension or flexion.  She does have active hip flexion.  She has no active plantar flexion or dorsiflexion of the ankle.  She has slight amount of plantar flexion and dorsiflexion of the toes.  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  No images are attached to the encounter.  Labs: Lab Results  Component Value Date   HGBA1C <4.0 (L) 10/26/2014   REPTSTATUS 07/17/2015 FINAL 07/15/2015   CULT MULTIPLE SPECIES PRESENT, SUGGEST RECOLLECTION (A) 07/15/2015    @LABSALLVALUES (HGBA1)@  There is no height or weight on file to calculate BMI.  Orders:  Orders Placed This Encounter  Procedures  . XR Foot Complete Right   No orders of the defined types were placed in this encounter.    Procedures: No procedures performed  Clinical Data: No additional findings.  ROS:  All other systems negative, except as noted in the HPI. Review of Systems  Objective: Vital Signs: There were no vitals taken for this visit.  Specialty Comments:  No specialty comments available.  PMFS History: Patient Active Problem List   Diagnosis Date Noted  . Dilantin toxicity 08/27/2016  . 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 Dementia 06/07/2016  . IBS (irritable bowel syndrome) 06/07/2016  . Tobacco use 06/07/2016  . Throat pain 12/29/2015  . Seizure disorder ([place]) 02/27/2015  . Aphasia   . Malnutrition of moderate degree 10/27/2014  . Hyperlipidemia 10/15/2014  . Essential hypertension 10/14/2014  . GERD (gastroesophageal reflux disease) 10/14/2014  . Vitamin D deficiency 10/14/2014  . Vitamin B12 deficiency 10/14/2014  .  Asthma 10/14/2014  . History of CVA with residual deficit 10/14/2014   Past Medical History:  Diagnosis Date  . Alcohol abuse   . B12 deficiency   . GERD (gastroesophageal reflux disease)   . Hypertension   . IBS (irritable bowel syndrome)   . Seizures ([place])   . 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 Stroke [facility])    residual right sided deficits, expressive dysphasia  . Vitamin D deficiency disease   . Vitamin E deficiency     Family History  Problem Relation Age of Onset  . Hypertension Mother   . Hypertension Father     . Kidney disease Father   . Hyperlipidemia Father   . Hypertension Sister   . Diabetes Sister     History reviewed. No pertinent surgical history. Social History   Occupational History  . Occupation: Disabled  Tobacco Use  . Smoking status: Current Some Day Smoker    Packs/day: 0.25    Years: 40.00    Pack years: 10.00    Types: Cigarettes  . Smokeless tobacco: Never Used  Substance and Sexual Activity  . Alcohol use: No  . Drug use: No  . Sexual activity: Not on file

## 2017-02-27 ENCOUNTER — Ambulatory Visit: Payer: Medicare Other | Admitting: Internal Medicine

## 2017-03-14 ENCOUNTER — Other Ambulatory Visit: Payer: Self-pay | Admitting: Internal Medicine

## 2017-03-14 DIAGNOSIS — R609 Edema, unspecified: Secondary | ICD-10-CM

## 2017-03-14 DIAGNOSIS — M542 Cervicalgia: Secondary | ICD-10-CM

## 2017-03-20 ENCOUNTER — Ambulatory Visit
Admission: RE | Admit: 2017-03-20 | Discharge: 2017-03-20 | Disposition: A | Discharge status: Home / Self Care | Payer: Medicare Other | Source: Ambulatory Visit | Attending: Internal Medicine | Admitting: Internal Medicine

## 2017-03-20 DIAGNOSIS — R609 Edema, unspecified: Secondary | ICD-10-CM

## 2017-03-20 DIAGNOSIS — M542 Cervicalgia: Secondary | ICD-10-CM

## 2017-03-20 IMAGING — CT CT HEAD W/O CM
3 of 4 series · 17 of 30 positions shown, 19 images · non-contrast
Comparison: None.

CLINICAL DATA: Patient has seizure lasting for 10-15 minutes.
Witnessed seizure. Patient had uncontrolled movements during scan
which limits evaluation. Repeat scan was performed.

EXAM:
CT HEAD WITHOUT CONTRAST
TECHNIQUE: Contiguous axial images were obtained from the base of the skull
through the vertex without intravenous contrast.

[Series 201: head w/o, idose (1) · axial · non-contrast · 0.43mm/px · z∈[+74,+164]mm · 5 of 28 slices shown (1 of 2)]
[im 5/28  brain]
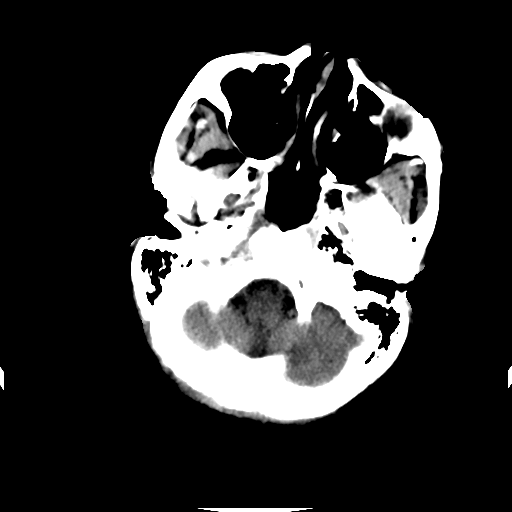
[im 10/28  brain]
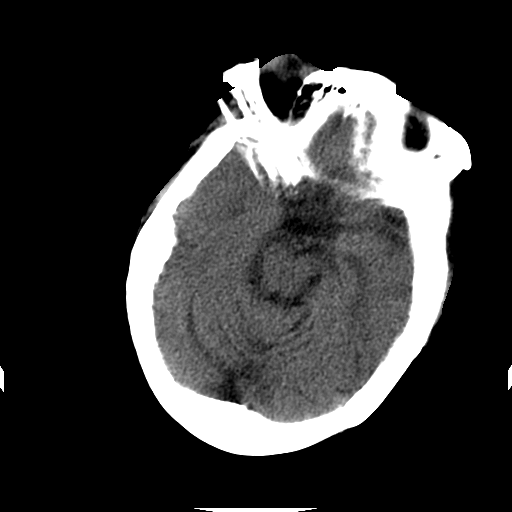
[im 14/28  brain]
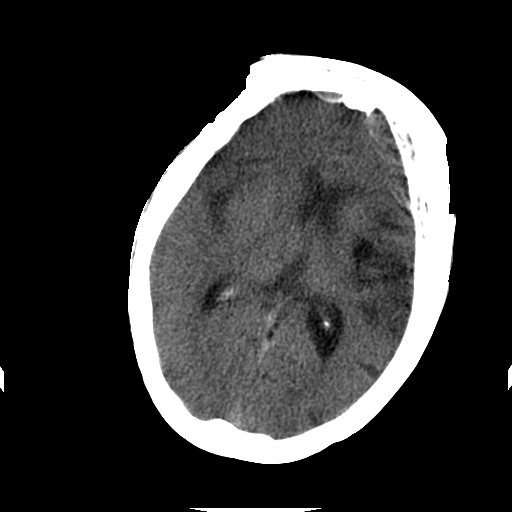
[im 19/28  brain]
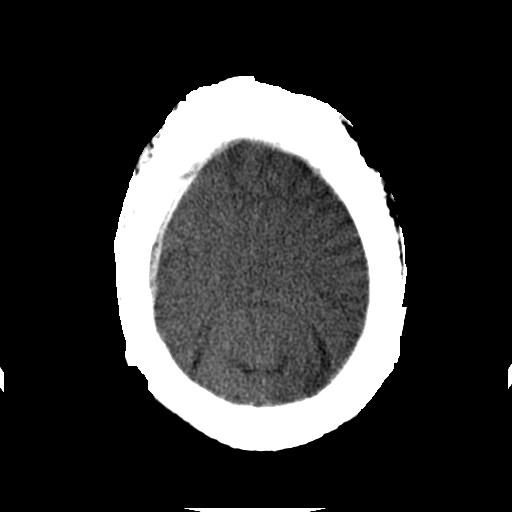
[im 23/28  brain]
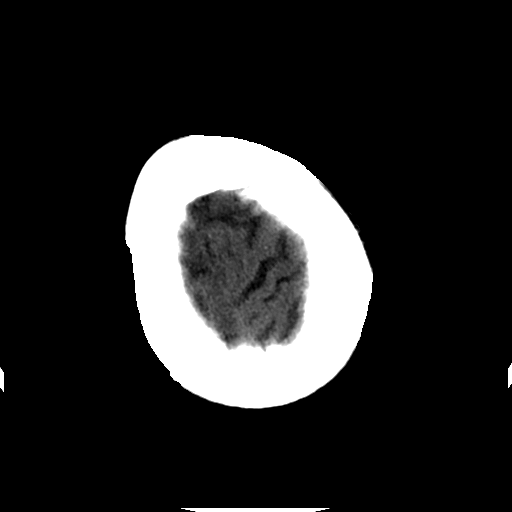

[Series 301: head w/o, idose (1) · axial · non-contrast · 0.43mm/px · z∈[+69,+169]mm · 6 of 28 slices shown, 8 images (2 of 2)]
[im 4/28  brain]
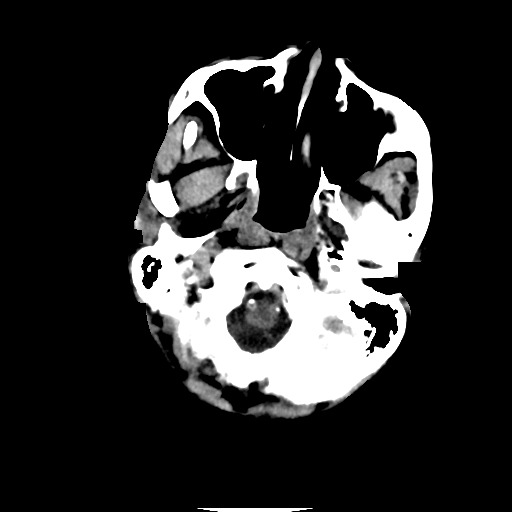
[im 4/28  bone]
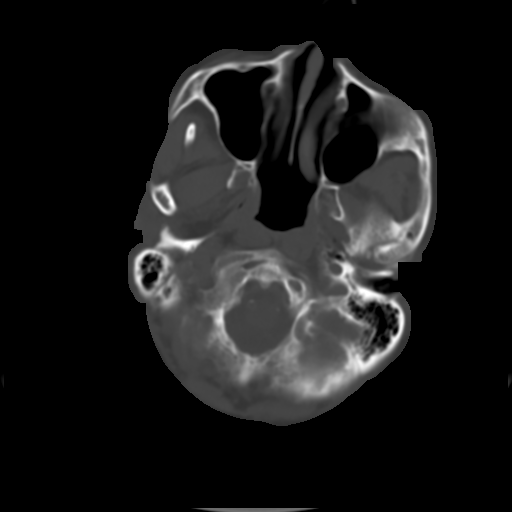
[im 8/28  brain]
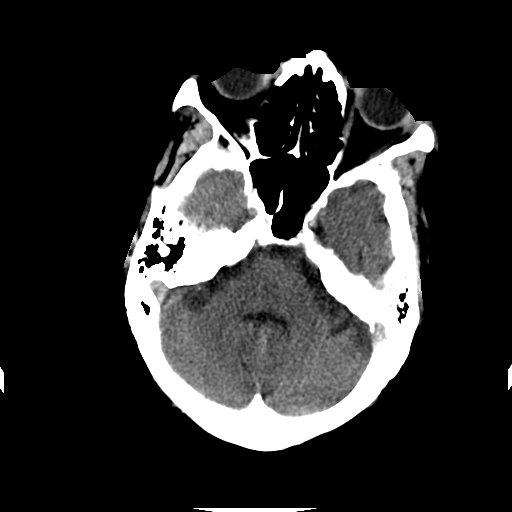
[im 12/28  brain]
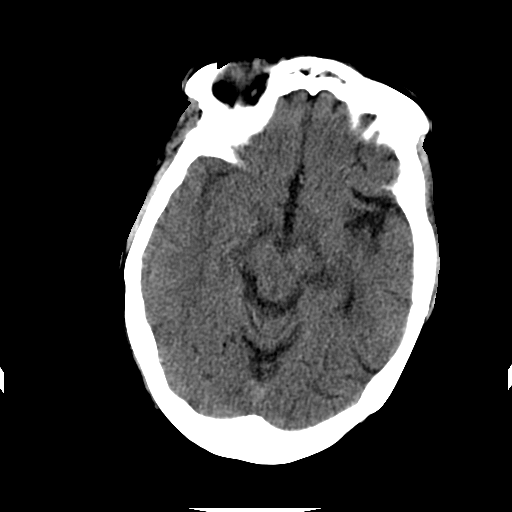
[im 16/28  brain]
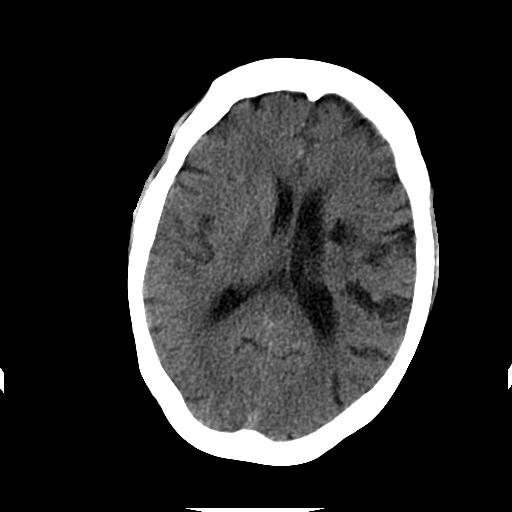
[im 20/28  brain]
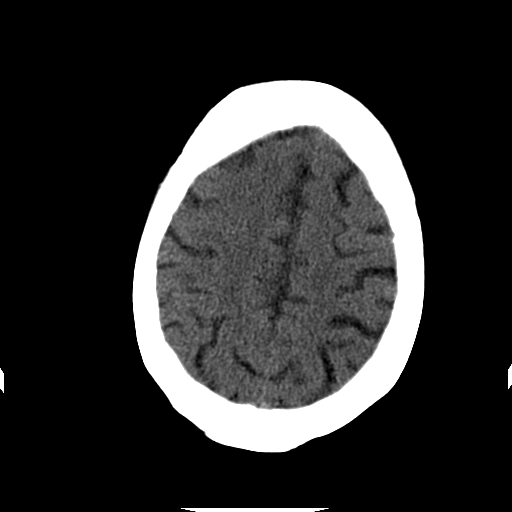
[im 20/28  bone]
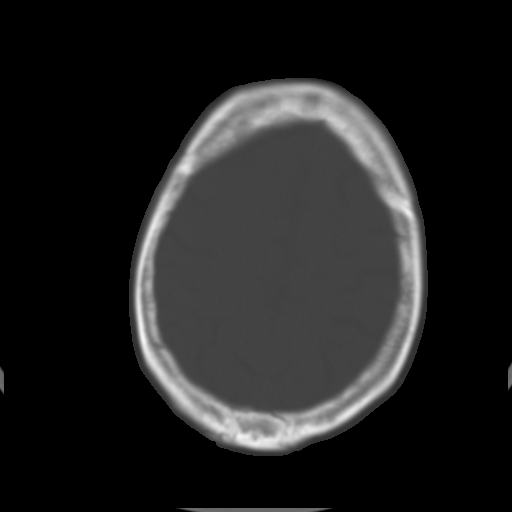
[im 24/28  brain]
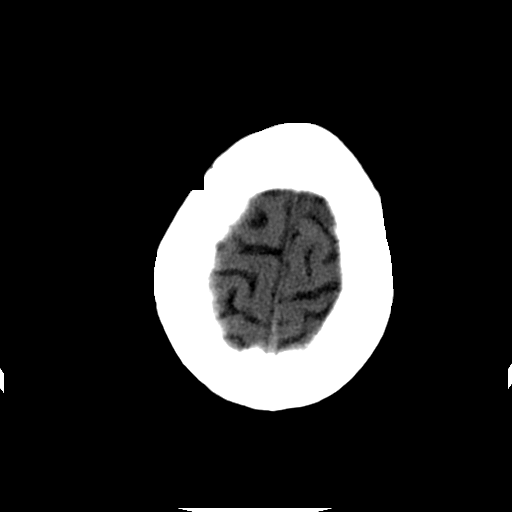

[Series 302: head w/o bone, idose (1) · axial · non-contrast · 0.43mm/px · z∈[+69,+169]mm · 6 of 28 slices shown]
[im 4/28  bone]
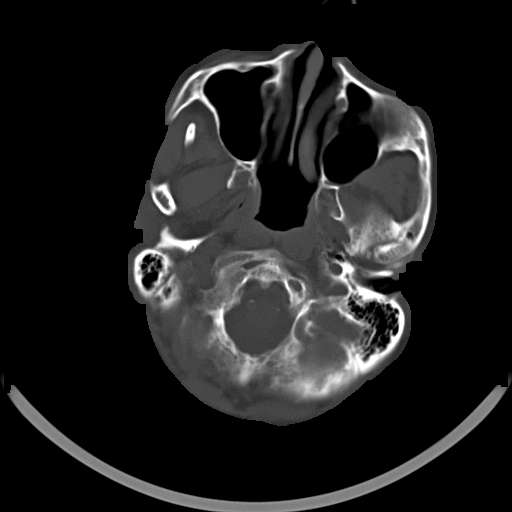
[im 8/28  bone]
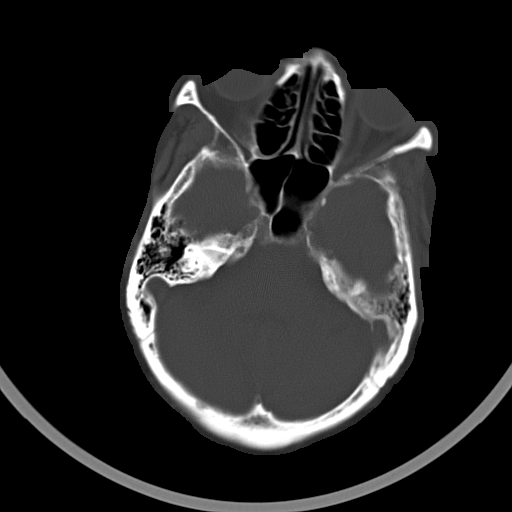
[im 12/28  bone]
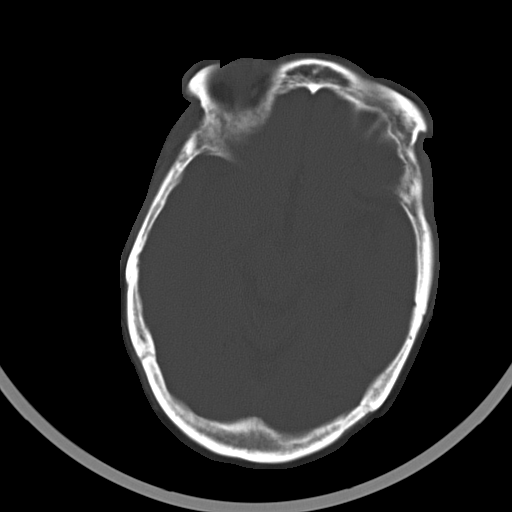
[im 16/28  bone]
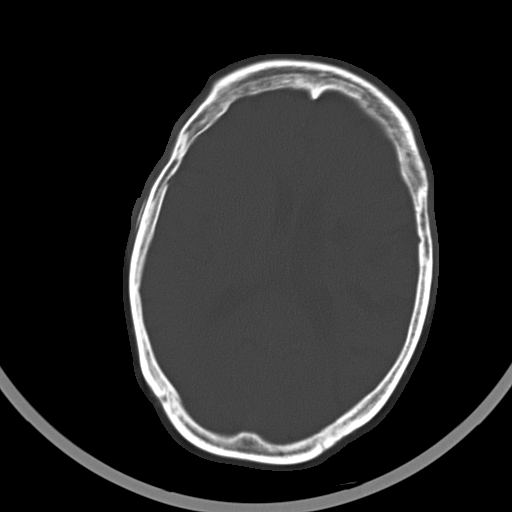
[im 20/28  bone]
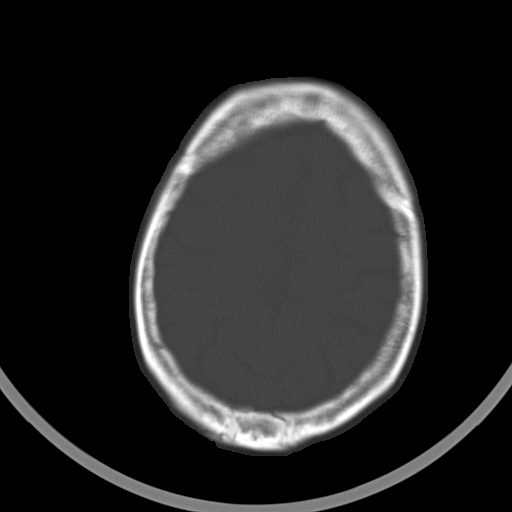
[im 24/28  bone]
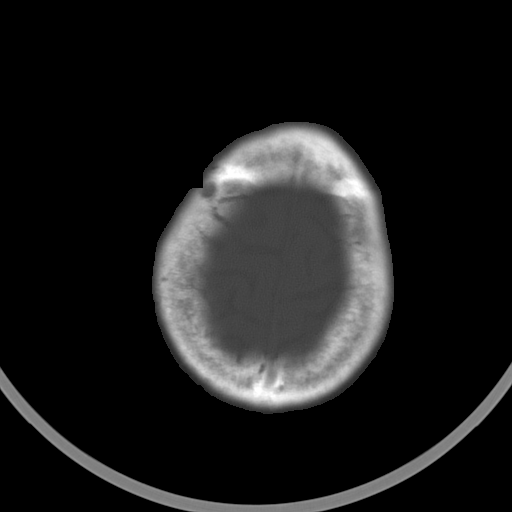

[17 of 30 positions shown; findings below may reference images not displayed]

FINDINGS: Repeat imaging improved head motion.

No acute intracranial hemorrhage. No focal mass lesion. No CT
evidence of acute infarction. No midline shift or mass effect. No
hydrocephalus. Basilar cisterns are patent.

Focal hypodensity in the in the LEFT external capsule, LEFT insular
ribbon and centrum semiovale. There is mild ventricular dilatation
on the LEFT associated with volume loss.

Paranasal sinuses and  mastoid air cells are clear.
IMPRESSION: 1. No acute intracranial findings.
2. Chronic infarction in the deep white matter and insular ribbon on
the LEFT with mild ex vacuo dilatation of the lateral ventricle.

## 2017-11-15 NOTE — Progress Notes (Signed)
[facility]  PATIENT: Tina Sanders DOB: 07/28/1955   REASON FOR VISIT: Follow-up for seizure disorder HISTORY FROM: Patient and sister    HISTORY OF PRESENT ILLNESS:UPDATE 11/8/2019CM Tina Sanders, 62 year old female returns for follow-up with her sister for seizure disorder.  Last seizure was in April 2018.  She is currently well controlled on lacosamide 200 mg  twice daily and levetiracetam 750 mg 1 tablet twice daily.  [facility]  [facility]s [facility] provided by [facility] .  Appetite is good no recent falls.  Sleeping well at night.  She returns for reevaluation UPDATE (11/15/16, VRP): Since last visit, doing well now. In August 2018, was admitted for confusion and aphasia, found to be toxic on dilantin. Dilantin stopped, and vimpat increased to 200mg  twice a day. Levetiracetam continued at 750mg  twice a day. Since then, no further seizures.   UPDATE 05/19/16: Since last visit, patient went to ER x 2 in April 2018, for confusion spells and possible seizures, on 04/30/16 and 05/04/16. The 2nd ER visit, dilantin level was toxic at 35.7. Dilantin dosing was reduced. Now patient back to baseline.   UPDATE 03/31/16: Since last visit, no seizures. Tolerating meds. No new neuro issues.   PRIOR HPI (10/01/15): 62 year old female here for evaluation of seizure disorder. Patient arrives with transport/caregiver from her assisted living facility. I reviewed referring provider notes, Emergency room notes, imaging and lab results. Summary patient has history of left frontal ischemic infarction many years ago (date not exactly known) with subsequent seizure disorder. Apparently she has history of chronic alcohol abuse as well. Unfortunately patient, caregiver and facility or not able to provide me any additional information. No one is sure when her seizures for started. Apparently her last seizure was 23 months ago but no one can give me a description of the event.  Patient is currently on 3 antiseizure medications including levetiracetam, Vimpat, Dilantin. Patient has a family member who lives in [place] is not available at this office visit today or by phone.    REVIEW OF SYSTEMS: Full 14 system review of systems performed and notable only for those listed, all others are neg:  Constitutional: neg  Cardiovascular: neg Ear/Nose/Throat: neg  Skin: neg Eyes: neg Respiratory: neg Gastroitestinal: neg  Hematology/Lymphatic: neg  Endocrine: neg Musculoskeletal:neg Allergy/Immunology: neg Neurological: Seizure disorder Psychiatric: neg Sleep : neg   ALLERGIES: Allergies  Allergen Reactions  . Penicillins Other (See Comments)    abdominal bloating Has patient had a PCN reaction causing immediate rash, facial/tongue/throat swelling, SOB or lightheadedness with hypotension: No Has patient had a PCN reaction causing severe rash involving mucus membranes or skin necrosis: No Has patient had a PCN reaction that required hospitalization No Has patient had a PCN reaction occurring within the last 10 years: No If all of the above answers are "NO", then may proceed with Cephalosporin use.   . Nicotine Itching, Dermatitis and Rash    HOME MEDICATIONS: Outpatient Medications Prior to Visit  Medication Sig Dispense Refill  . acetaminophen (TYLENOL) 500 MG tablet Take 500-1,000 mg every 6 (six) hours as needed by mouth.    . ENSURE PLUS (ENSURE PLUS) LIQD Take 1 Can 2 (two) times daily between meals by mouth.    . lacosamide (VIMPAT) 200 MG TABS tablet Take 1 tablet (200 mg total) by mouth 2 (two) times daily. 60 tablet 0  . levETIRAcetam (KEPPRA) 750 MG tablet Take 1 tablet (750 mg total) by mouth  2 (two) times daily. 180 tablet 3  . Menthol, Topical Analgesic, (BIOFREEZE EX) Apply topically. Uses BID prn    . mirtazapine (REMERON) 15 MG tablet Take 15 mg at bedtime by mouth.    Marland Kitchen lisinopril-hydrochlorothiazide (ZESTORETIC) 10-12.5 MG tablet Take  1 tablet by mouth daily. 90 tablet 3   No facility-administered medications prior to visit.     PAST MEDICAL HISTORY: Past Medical History:  Diagnosis Date  . Alcohol abuse   . B12 deficiency   . GERD (gastroesophageal reflux disease)   . Hypertension   . IBS (irritable bowel syndrome)   . Seizures ([place])   . 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 Stroke [facility])    residual right sided deficits, expressive dysphasia  . Vitamin D deficiency disease   . Vitamin E deficiency     PAST SURGICAL HISTORY: History reviewed. No pertinent surgical history.  FAMILY HISTORY: Family History  Problem Relation Age of Onset  . Hypertension Mother   . Hypertension Father   . Kidney disease Father   . Hyperlipidemia Father   . Hypertension Sister   . Diabetes Sister     SOCIAL HISTORY: Social History   Socioeconomic History  . Marital status: Single    Spouse name: Not on file  . Number of children: 0  . Years of education: HS  . Highest education level: Not on file  Occupational History  . Occupation: Disabled  Social Needs  . Financial resource strain: Not on file  . Food insecurity:    Worry: Not on file    Inability: Not on file  . Transportation needs:    Medical: Not on file    Non-medical: Not on file  Tobacco Use  . Smoking status: Current Some Day Smoker    Packs/day: 0.25    Years: 40.00    Pack years: 10.00    Types: Cigarettes  . Smokeless tobacco: Never Used  Substance and Sexual Activity  . Alcohol use: No  . Drug use: No  . Sexual activity: Not on file  Lifestyle  . Physical activity:    Days per week: Not on file    Minutes per session: Not on file  . Stress: Not on file  Relationships  . Social connections:    Talks on phone: Not on file    Gets together: Not on file    Attends religious service: Not on file    Active member of club or organization: Not on file    Attends meetings of clubs or organizations: Not on file    Relationship status: Not  on file  . Intimate partner violence:    Fear of current or ex partner: Not on file    Emotionally abused: Not on file    Physically abused: Not on file    Forced sexual activity: Not on file  Other Topics Concern  . Not on file  Social History Narrative   Lives at home with daughter, Blanch Media.  Attends [facility].     Right-handed.   No caffeine use.     PHYSICAL EXAM  Vitals:   11/16/17 0910  BP: (!) 105/56  Pulse: 82  Weight: 117 lb (53.1 kg)  Height: 5' (1.524 m)   Body mass index is 22.85 kg/m.  Generalized: Well developed, in no acute distress  Head: normocephalic and atraumatic,. Oropharynx benign poor dentation Neck: Supple,  Musculoskeletal: No deformity   Neurological examination   Mentation: Alert , not oriented to place or time, able  to tell me her name, decreased attention and concentration decreased comprehension expressive aphasia , moderate dysarthria  Cranial nerve II-XII: Pupils were equal round reactive to light extraocular movements were full, visual field were full on confrontational test. Facial sensation and strength were normal. hearing was intact to finger rubbing bilaterally. Uvula tongue midline. head turning and shoulder shrug were normal and symmetric.Tongue protrusion into cheek strength was normal. Motor: Increased tone, right arm and right leg 4/5, right hand held in flexion, full strength on the left Sensory: normal and symmetric to light touch, pinprick, and  Vibration,   Coordination: finger-nose-finger, slow movements on the right Reflexes: Slightly brisk on the right arm and right knee as compared to the left,. Gait and Station: Narrow based gait no assistive device no difficulty with turns DIAGNOSTIC DATA (LABS, IMAGING, TESTING) - I reviewed patient records, labs, notes, testing and imaging myself where available.  Lab Results  Component Value Date   WBC 6.0 08/28/2016   HGB 10.5 (L) 08/28/2016   HCT 30.7 (L) 08/28/2016   MCV 86.7  08/28/2016   PLT 271 08/28/2016      Component Value Date/Time   NA 138 08/28/2016 0535   NA 136 11/13/2014 1018   K 3.5 08/28/2016 0535   CL 106 08/28/2016 0535   CO2 28 08/28/2016 0535   GLUCOSE 84 08/28/2016 0535   BUN 11 08/28/2016 0535   BUN 10 11/13/2014 1018   CREATININE 0.68 08/28/2016 0535   CALCIUM 8.3 (L) 08/28/2016 0535   PROT 6.1 (L) 08/28/2016 0535   PROT 7.3 11/13/2014 1018   ALBUMIN 3.2 (L) 08/28/2016 0535   ALBUMIN 4.0 11/13/2014 1018   AST 37 08/28/2016 0535   ALT 28 08/28/2016 0535   ALKPHOS 117 08/28/2016 0535   BILITOT 0.7 08/28/2016 0535   BILITOT 0.3 11/13/2014 1018   GFRNONAA >60 08/28/2016 0535   GFRAA >60 08/28/2016 0535   Lab Results  Component Value Date   CHOL 213 (H) 10/14/2014   HDL 70 10/14/2014   LDLCALC 132 (H) 10/14/2014   TRIG 57 10/14/2014   CHOLHDL 3.0 10/14/2014   Lab Results  Component Value Date   HGBA1C <4.0 (L) 10/26/2014   Lab Results  Component Value Date   VITAMINB12 1,459 (H) 10/26/2014   Lab Results  Component Value Date   TSH 1.281 08/27/2016      ASSESSMENT AND PLAN 62y.o. year old female here with history of chronic left MCA ischemic infarction, subsequent expressive aphasia and dysarthria with right hemiparesis, and seizure disorder. Also has history of alcohol and cocaine abuse, now in remission.       PLAN: Continue Vimpat at current dose Continue Keppra at current dose  CBC and CMP per PCP Call for seizure activity Follow up in 1 year Dennie Bible, [facility], [facility], [facility] [address], [place], [place] [address] [phone]

## 2017-11-16 ENCOUNTER — Ambulatory Visit (INDEPENDENT_AMBULATORY_CARE_PROVIDER_SITE_OTHER): Payer: No Typology Code available for payment source | Admitting: Nurse Practitioner

## 2017-11-16 ENCOUNTER — Encounter: Payer: Self-pay | Admitting: Nurse Practitioner

## 2017-11-16 VITALS — BP 105/56 | HR 82 | Ht 60.0 in | Wt 117.0 lb

## 2017-11-16 DIAGNOSIS — G40909 Epilepsy, unspecified, not intractable, without status epilepticus: Secondary | ICD-10-CM

## 2017-11-16 DIAGNOSIS — I6930 Unspecified sequelae of cerebral infarction: Secondary | ICD-10-CM | POA: Diagnosis not present

## 2017-11-16 DIAGNOSIS — R4701 Aphasia: Secondary | ICD-10-CM

## 2017-11-16 NOTE — Patient Instructions (Signed)
Continue Vimpat at current dose Continue Keppra at current dose  Follow up in 1 year

## 2017-11-27 NOTE — Progress Notes (Signed)
I reviewed note and agree with plan.   Tatum Massman R. Ryna Beckstrom, MD  Certified in Neurology, Neurophysiology and Neuroimaging  [facility] [address], [address] [place], [place] [address] [phone]   

## 2018-01-29 ENCOUNTER — Ambulatory Visit (INDEPENDENT_AMBULATORY_CARE_PROVIDER_SITE_OTHER): Payer: No Typology Code available for payment source | Admitting: Podiatry

## 2018-01-29 VITALS — BP 115/63 | HR 87

## 2018-01-29 DIAGNOSIS — B351 Tinea unguium: Secondary | ICD-10-CM

## 2018-01-29 DIAGNOSIS — M79674 Pain in right toe(s): Secondary | ICD-10-CM | POA: Diagnosis not present

## 2018-01-29 DIAGNOSIS — Q828 Other specified congenital malformations of skin: Secondary | ICD-10-CM

## 2018-01-29 DIAGNOSIS — M79675 Pain in left toe(s): Secondary | ICD-10-CM | POA: Diagnosis not present

## 2018-01-30 NOTE — Progress Notes (Signed)
Subjective: 63 y.o. returns the office today for painful, elongated, thickened toenails which she cannot trim themself. Denies any redness or drainage around the nails. She also has a callus on the right big toe that she would like trimmed. Denies any acute changes since last appointment and no new complaints today. Denies any systemic complaints such as fevers, chills, nausea, vomiting.   PCP: Angelica Pou, MD  Objective: AAO 3, NAD DP/PT pulses palpable, CRT less than 3 seconds Sensation decreased with SWMF.  Nails hypertrophic, dystrophic, elongated, brittle, discolored 10. There is tenderness overlying the nails 1-5 bilaterally. There is no surrounding erythema or drainage along the nail sites. Hyperkeratotic lesion right medial hallux. Upon debridement there is no underlying ulceration, drainage, or signs of infection No open lesions or pre-ulcerative lesions are identified. Dropfoot on the right.  No pain with calf compression, swelling, warmth, erythema.  Assessment: Patient presents with symptomatic onychomycosis; hyperkeratotic lesion  Plan: -Treatment options including alternatives, risks, complications were discussed -Nails sharply debrided 10 without complication/bleeding. -Hyperkeratotic lesion sharply debrided x 1 without any complications or bleeding.  -Discussed daily foot inspection. If there are any changes, to call the office immediately.  -Follow-up in 3 months or sooner if any problems are to arise. In the meantime, encouraged to call the office with any questions, concerns, changes symptoms.  Celesta Gentile, DPM

## 2018-02-25 ENCOUNTER — Other Ambulatory Visit: Payer: Self-pay | Admitting: Internal Medicine

## 2018-02-25 DIAGNOSIS — Z1231 Encounter for screening mammogram for malignant neoplasm of breast: Secondary | ICD-10-CM

## 2018-03-27 ENCOUNTER — Ambulatory Visit: Payer: No Typology Code available for payment source

## 2018-04-18 ENCOUNTER — Ambulatory Visit: Payer: No Typology Code available for payment source

## 2018-04-30 ENCOUNTER — Ambulatory Visit: Payer: No Typology Code available for payment source | Admitting: Podiatry

## 2018-05-02 NOTE — Telephone Encounter (Signed)
Review meds 

## 2018-06-19 ENCOUNTER — Ambulatory Visit: Payer: No Typology Code available for payment source

## 2018-08-14 ENCOUNTER — Ambulatory Visit
Admission: RE | Admit: 2018-08-14 | Discharge: 2018-08-14 | Disposition: A | Discharge status: Home / Self Care | Payer: Medicare (Managed Care) | Source: Ambulatory Visit | Attending: Internal Medicine | Admitting: Internal Medicine

## 2018-08-14 ENCOUNTER — Other Ambulatory Visit: Payer: Self-pay

## 2018-08-14 DIAGNOSIS — Z1231 Encounter for screening mammogram for malignant neoplasm of breast: Secondary | ICD-10-CM

## 2018-12-03 ENCOUNTER — Other Ambulatory Visit: Payer: Self-pay

## 2018-12-03 ENCOUNTER — Encounter: Payer: Self-pay | Admitting: Diagnostic Neuroimaging

## 2018-12-03 ENCOUNTER — Ambulatory Visit (INDEPENDENT_AMBULATORY_CARE_PROVIDER_SITE_OTHER): Payer: Medicare (Managed Care) | Admitting: Diagnostic Neuroimaging

## 2018-12-03 VITALS — BP 122/68 | HR 80 | Temp 97.0°F | Ht 60.0 in | Wt 119.8 lb

## 2018-12-03 DIAGNOSIS — R4701 Aphasia: Secondary | ICD-10-CM

## 2018-12-03 DIAGNOSIS — G40909 Epilepsy, unspecified, not intractable, without status epilepticus: Secondary | ICD-10-CM | POA: Diagnosis not present

## 2018-12-03 DIAGNOSIS — I6930 Unspecified sequelae of cerebral infarction: Secondary | ICD-10-CM | POA: Diagnosis not present

## 2018-12-03 NOTE — Progress Notes (Signed)
[facility]  PATIENT: Tina Sanders DOB: 07-20-1955  REFERRING CLINICIAN: Lesia Hausen, PA-c HISTORY FROM: patient and sister REASON FOR VISIT: follow up   HISTORICAL  CHIEF COMPLAINT:  Chief Complaint  Patient presents with   Follow-up    Rm 6, sister. No seizures.  [facility].   Seizures    HISTORY OF PRESENT ILLNESS:   UPDATE (12/03/18, VRP): Since last visit, doing well. Symptoms are stable. No seizures. No alleviating or aggravating factors. Tolerating meds (vimpat and levetiracetam).    UPDATE (11/15/16, VRP): Since last visit, doing well now. In August 2018, was admitted for confusion and aphasia, found to be toxic on dilantin. Dilantin stopped, and vimpat increased to 200mg  twice a day. Levetiracetam continued at 750mg  twice a day. Since then, no further seizures.   UPDATE 05/19/16: Since last visit, patient went to ER x 2 in April 2018, for confusion spells and possible seizures, on 04/30/16 and 05/04/16. The 2nd ER visit, dilantin level was toxic at 35.7. Dilantin dosing was reduced. Now patient back to baseline.   UPDATE 03/31/16: Since last visit, no seizures. Tolerating meds. No new neuro issues.   PRIOR HPI (10/01/15): 63 year old female here for evaluation of seizure disorder. Patient arrives with transport/caregiver from her assisted living facility. I reviewed referring provider notes, Emergency room notes, imaging and lab results. Summary patient has history of left frontal ischemic infarction many years ago (date not exactly known) with subsequent seizure disorder. Apparently she has history of chronic alcohol abuse as well. Unfortunately patient, caregiver and facility or not able to provide me any additional information. No one is sure when her seizures for started. Apparently her last seizure was 23 months ago but no one can give me a description of the event. Patient is currently on 3 antiseizure medications including levetiracetam, Vimpat,  Dilantin. Patient has a family member who lives in [place] is not available at this office visit today or by phone.   REVIEW OF SYSTEMS: Full 14 system review of systems performed and negative with exception of: only as per HPI.    ALLERGIES: Allergies  Allergen Reactions   Penicillins Other (See Comments)    abdominal bloating Has patient had a PCN reaction causing immediate rash, facial/tongue/throat swelling, SOB or lightheadedness with hypotension: No Has patient had a PCN reaction causing severe rash involving mucus membranes or skin necrosis: No Has patient had a PCN reaction that required hospitalization No Has patient had a PCN reaction occurring within the last 10 years: No If all of the above answers are "NO", then may proceed with Cephalosporin use.    Nicotine Itching, Dermatitis and Rash    HOME MEDICATIONS: Outpatient Medications Prior to Visit  Medication Sig Dispense Refill   acetaminophen (TYLENOL) 500 MG tablet Take 500-1,000 mg every 6 (six) hours as needed by mouth.     aspirin EC 81 MG tablet Take 81 mg by mouth daily.     atorvastatin (LIPITOR) 10 MG tablet Take 10 mg by mouth daily at 6 PM.     lacosamide (VIMPAT) 200 MG TABS tablet Take 1 tablet (200 mg total) by mouth 2 (two) times daily. 60 tablet 0   levETIRAcetam (KEPPRA) 750 MG tablet Take 1 tablet (750 mg total) by mouth 2 (two) times daily. 180 tablet 3   lisinopril (ZESTRIL) 10 MG tablet Take 10 mg by mouth daily.     Menthol, Topical Analgesic, (BIOFREEZE EX) Apply topically. Uses BID prn     mirtazapine (REMERON)  15 MG tablet Take 15 mg at bedtime by mouth.     OVER THE COUNTER MEDICATION Laxative plu: 2 tabs once daily at bedtime for constipation     UNKNOWN TO PATIENT Protein shake by mouth twice daily.     ENSURE PLUS (ENSURE PLUS) LIQD Take 1 Can 2 (two) times daily between meals by mouth.     lisinopril-hydrochlorothiazide (ZESTORETIC) 10-12.5 MG tablet Take 1 tablet by mouth  daily. 90 tablet 3   No facility-administered medications prior to visit.     PAST MEDICAL HISTORY: Past Medical History:  Diagnosis Date   Alcohol abuse    B12 deficiency    GERD (gastroesophageal reflux disease)    Hypertension    IBS (irritable bowel syndrome)    Seizures ([place])    Sickle-cell/Hb-C disease ([facility])    Stroke ([facility])    residual right sided deficits, expressive dysphasia   Vitamin D deficiency disease    Vitamin E deficiency     PAST SURGICAL HISTORY: History reviewed. No pertinent surgical history.  FAMILY HISTORY: Family History  Problem Relation Age of Onset   Hypertension Mother    Hypertension Father    Kidney disease Father    Hyperlipidemia Father    Hypertension Sister    Diabetes Sister     SOCIAL HISTORY:  Social History   Socioeconomic History   Marital status: Single    Spouse name: Not on file   Number of children: 0   Years of education: HS   Highest education level: Not on file  Occupational History   Occupation: Disabled  Scientist, product/process development strain: Not on file   Food insecurity    Worry: Not on file    Inability: Not on file   Transportation needs    Medical: Not on file    Non-medical: Not on file  Tobacco Use   Smoking status: Current Some Day Smoker    Packs/day: 0.25    Years: 40.00    Pack years: 10.00    Types: Cigarettes   Smokeless tobacco: Never Used  Substance and Sexual Activity   Alcohol use: No   Drug use: No   Sexual activity: Not on file  Lifestyle   Physical activity    Days per week: Not on file    Minutes per session: Not on file   Stress: Not on file  Relationships   Social connections    Talks on phone: Not on file    Gets together: Not on file    Attends religious service: Not on file    Active member of club or organization: Not on file    Attends meetings of clubs or organizations: Not on file    Relationship status: Not on file   Intimate  partner violence    Fear of current or ex partner: Not on file    Emotionally abused: Not on file    Physically abused: Not on file    Forced sexual activity: Not on file  Other Topics Concern   Not on file  Social History Narrative   Lives at home with daughter, Blanch Media.  Attends [facility].     Right-handed.   No caffeine use.     PHYSICAL EXAM  GENERAL EXAM/CONSTITUTIONAL: Vitals:  Vitals:   12/03/18 0851  BP: 122/68  Pulse: 80  Temp: (!) 97 F (36.1 C)  SpO2: 95%  Weight: 119 lb 12.8 oz (54.3 kg)  Height: 5' (1.524 m)   Wt Readings from  Last 3 Encounters:  12/03/18 119 lb 12.8 oz (54.3 kg)  11/16/17 117 lb (53.1 kg)  11/15/16 95 lb 12.8 oz (43.5 kg)   Body mass index is 23.4 kg/m. No exam data present  Patient is in no distress; well developed, nourished and groomed; neck is supple  CARDIOVASCULAR:  Examination of carotid arteries is normal; no carotid bruits  Regular rate and rhythm, no murmurs  Examination of peripheral vascular system by observation and palpation is normal  POOR DENTITION  THIN, FRAIL APPEARING  EYES:  Ophthalmoscopic exam of optic discs and posterior segments is normal; no papilledema or hemorrhages  MUSCULOSKELETAL:  Gait, strength, tone, movements noted in Neurologic exam below  NEUROLOGIC: MENTAL STATUS:  No flowsheet data found.  EXPRESSIVE APHASIA  awake, alert, oriented to self; NOT PLACE OR TIME; ABLE TO TELL ME HER NAME  DECR memory   DECRE attention and concentration  DECR FLUENCY, DECR COMPREHENSION, DECR NAMING   DECR fund of knowledge  CRANIAL NERVE:   2nd - no papilledema on fundoscopic exam  2nd, 3rd, 4th, 6th - pupils equal and reactive to light, visual fields full to confrontation, extraocular muscles intact, no nystagmus  5th - facial sensation symmetric  7th - facial strength symmetric  8th - hearing intact  9th - palate elevates symmetrically, uvula midline  11th - shoulder shrug  symmetric  12th - tongue protrusion midline  MOD-SEVERE DYSARTHRIA  MOTOR:   normal bulk  MILD INCREASED TONE ON RIGHT ARM AND RIGHT LEG  DECR RUE AND RLE (4/5)  SLOW MOVEMENTS ON RIGHT SIDE  RIGHT HAND FLEXED  SENSORY:   normal and symmetric to light touch and symm  COORDINATION:   finger-nose-finger, fine finger movements SLOW ON RIGHT SIDE  REFLEXES:   deep tendon reflexes present; SLIGHTLY BRISK ON RIGHT ARM AND RIGHT KNEE  GAIT/STATION:   RIGHT HEMIPARETIC GAIT    DIAGNOSTIC DATA (LABS, IMAGING, TESTING) - I reviewed patient records, labs, notes, testing and imaging myself where available.  Lab Results  Component Value Date   WBC 6.0 08/28/2016   HGB 10.5 (L) 08/28/2016   HCT 30.7 (L) 08/28/2016   MCV 86.7 08/28/2016   PLT 271 08/28/2016      Component Value Date/Time   NA 138 08/28/2016 0535   NA 136 11/13/2014 1018   K 3.5 08/28/2016 0535   CL 106 08/28/2016 0535   CO2 28 08/28/2016 0535   GLUCOSE 84 08/28/2016 0535   BUN 11 08/28/2016 0535   BUN 10 11/13/2014 1018   CREATININE 0.68 08/28/2016 0535   CALCIUM 8.3 (L) 08/28/2016 0535   PROT 6.1 (L) 08/28/2016 0535   PROT 7.3 11/13/2014 1018   ALBUMIN 3.2 (L) 08/28/2016 0535   ALBUMIN 4.0 11/13/2014 1018   AST 37 08/28/2016 0535   ALT 28 08/28/2016 0535   ALKPHOS 117 08/28/2016 0535   BILITOT 0.7 08/28/2016 0535   BILITOT 0.3 11/13/2014 1018   GFRNONAA >60 08/28/2016 0535   GFRAA >60 08/28/2016 0535   Lab Results  Component Value Date   CHOL 213 (H) 10/14/2014   HDL 70 10/14/2014   LDLCALC 132 (H) 10/14/2014   TRIG 57 10/14/2014   CHOLHDL 3.0 10/14/2014   Lab Results  Component Value Date   HGBA1C <4.0 (L) 10/26/2014   Lab Results  Component Value Date   VITAMINB12 1,459 (H) 10/26/2014   Lab Results  Component Value Date   TSH 1.281 08/27/2016   Lab Results  Component Value Date  PHENYTOIN 21.6 (H) 08/28/2016   07/15/15 CT head [I reviewed images myself and agree with  interpretation. -VRP]  1. No evidence of acute intracranial abnormality. 2. Chronic left MCA infarct. 3. No acute osseous abnormality identified in the cervical spine. 4. Mild platybasia, mild basilar invagination, and mild cervical spondylosis.  03/02/15 EEG - This awake EEG is abnormal due to focal slowing over the left temporal region.     ASSESSMENT AND PLAN  63 y.o. year old female here with history of chronic left MCA ischemic infarction, subsequent expressive aphasia and dysarthria with right hemiparesis, and seizure disorder. Also has history of alcohol and cocaine abuse, now in remission.   Dx:  1. History of CVA with residual deficit   2. Seizure disorder ([place])   3. Aphasia      PLAN:  SEIZURE DISORDER (stable; last seizure in 2018) - continue current meds (levetiracetam 750mg  BID, vimpat 200mg  BID) - follow up as needed  Return [facility], for return to PCP, pending if symptoms worsen or fail to improve.   Penni Bombard, MD [id number], 0000000 AM Certified in Neurology, Neurophysiology and Neuroimaging  [facility] [address] [place], [place] [address] [phone]

## 2019-02-26 ENCOUNTER — Emergency Department (HOSPITAL_COMMUNITY)
Admission: EM | Admit: 2019-02-26 | Discharge: 2019-02-27 | Disposition: A | Discharge status: Home / Self Care | Payer: Medicare (Managed Care) | Attending: Emergency Medicine | Admitting: Emergency Medicine

## 2019-02-26 DIAGNOSIS — R569 Unspecified convulsions: Secondary | ICD-10-CM | POA: Diagnosis present

## 2019-02-26 DIAGNOSIS — F17210 Nicotine dependence, cigarettes, uncomplicated: Secondary | ICD-10-CM | POA: Diagnosis not present

## 2019-02-26 DIAGNOSIS — Z79899 Other long term (current) drug therapy: Secondary | ICD-10-CM | POA: Diagnosis not present

## 2019-02-26 DIAGNOSIS — I10 Essential (primary) hypertension: Secondary | ICD-10-CM | POA: Insufficient documentation

## 2019-02-26 DIAGNOSIS — Z7982 Long term (current) use of aspirin: Secondary | ICD-10-CM | POA: Diagnosis not present

## 2019-02-26 DIAGNOSIS — E876 Hypokalemia: Secondary | ICD-10-CM | POA: Insufficient documentation

## 2019-02-26 MED ORDER — LEVETIRACETAM IN NACL 1000 MG/100ML IV SOLN
1000.0000 mg | Freq: Once | INTRAVENOUS | Status: AC
  Administered 2019-02-27: 1000 mg via INTRAVENOUS
  Filled 2019-02-26: qty 100

## 2019-02-26 NOTE — ED Notes (Signed)
Tina Sanders sister [phone] looking for an update

## 2019-02-26 NOTE — ED Triage Notes (Addendum)
Pt arrived from home via [facility] c/o of a seizure lasting for 2 minutes. Per family pt has a history of stroke with disabilities. Pt unable to verbalize history but did express that she did not take her seizure medication but unknown for how long. Per family pt also had 3 beers today but per family is a baseline of mentation. Per EMS family not clear on when seizure happened

## 2019-02-26 NOTE — ED Provider Notes (Signed)
[facility] EMERGENCY DEPARTMENT Provider Note   CSN: [id number] Arrival date & time: 02/26/19  2312     History Chief Complaint  Patient presents with  . Seizures    Tina Sanders is a 64 y.o. female.  Patient to ED from home with reported seizure. 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 Per EMS, she had a single 2-minute seizure tonight. She is reported to have been noncompliant with Keppra but unknown for how long. EMS also reports family said she had 3 beers tonight. No reported fall or injury. Family unavailable by phone for additional information.   The history is provided by the EMS personnel.  Seizures      Past Medical History:  Diagnosis Date  . Alcohol abuse   . B12 deficiency   . GERD (gastroesophageal reflux disease)   . Hypertension   . IBS (irritable bowel syndrome)   . Seizures ([place])   . 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 Stroke [facility])    residual right sided deficits, expressive dysphasia  . Vitamin D deficiency disease   . Vitamin E deficiency     Patient Active Problem List   Diagnosis Date Noted  . Dilantin toxicity 08/27/2016  . 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 Dementia ([place]) 06/07/2016  . IBS (irritable bowel syndrome) 06/07/2016  . Tobacco use 06/07/2016  . Throat pain 12/29/2015  . Seizure disorder ([place]) 02/27/2015  . Aphasia   . Malnutrition of moderate degree 10/27/2014  . Hyperlipidemia 10/15/2014  . Essential hypertension 10/14/2014  . GERD (gastroesophageal reflux disease) 10/14/2014  . Vitamin D deficiency 10/14/2014  . Vitamin B12 deficiency 10/14/2014  . Asthma 10/14/2014  . History of CVA with residual deficit 10/14/2014    No past surgical history on file.   OB History   No obstetric history on file.     Family History  Problem Relation Age of Onset  . Hypertension Mother   . Hypertension Father   . Kidney disease Father   . Hyperlipidemia Father   . Hypertension Sister   .  Diabetes Sister     Social History   Tobacco Use  . Smoking status: Current Some Day Smoker    Packs/day: 0.25    Years: 40.00    Pack years: 10.00    Types: Cigarettes  . Smokeless tobacco: Never Used  Substance Use Topics  . Alcohol use: No  . Drug use: No    Home Medications Prior to Admission medications   Medication Sig Start Date End Date Taking? Authorizing Provider  acetaminophen (TYLENOL) 500 MG tablet Take 500-1,000 mg every 6 (six) hours as needed by mouth.    [provider]  aspirin EC 81 MG tablet Take 81 mg by mouth daily.    [provider]  atorvastatin (LIPITOR) 10 MG tablet Take 10 mg by mouth daily at 6 PM.    [provider]  lacosamide (VIMPAT) 200 MG TABS tablet Take 1 tablet (200 mg total) by mouth 2 (two) times daily. 08/28/16   Raiford Noble Latif, DO  levETIRAcetam (KEPPRA) 750 MG tablet Take 1 tablet (750 mg total) by mouth 2 (two) times daily. 06/09/16   Penumalli, Earlean Polka, MD  lisinopril (ZESTRIL) 10 MG tablet Take 10 mg by mouth daily.    [provider]  Menthol, Topical Analgesic, (BIOFREEZE EX) Apply topically. Uses BID prn    [provider]  mirtazapine (REMERON) 15 MG tablet Take 15 mg at bedtime by mouth.  [provider]  OVER THE COUNTER MEDICATION Laxative plu: 2 tabs once daily at bedtime for constipation    [provider]  UNKNOWN TO PATIENT Protein shake by mouth twice daily.    [provider]    Allergies    Penicillins and Nicotine  Review of Systems   Review of Systems  Unable to perform ROS: Patient nonverbal  Neurological: Positive for seizures.    Physical Exam Updated Vital Signs BP 108/66   Pulse 87   Temp 97.7 F (36.5 C) (Oral)   Resp 16   SpO2 100%   Physical Exam Vitals and nursing note reviewed.  Constitutional:      General: She is not in acute distress.    Appearance: Normal appearance. She is not ill-appearing.  HENT:     Head:  Normocephalic and atraumatic.  Cardiovascular:     Rate and Rhythm: Normal rate and regular rhythm.     Heart sounds: No murmur.  Pulmonary:     Effort: Pulmonary effort is normal.     Breath sounds: No wheezing, rhonchi or rales.  Abdominal:     General: Abdomen is flat.     Tenderness: There is no abdominal tenderness.  Musculoskeletal:        General: Normal range of motion.  Skin:    General: Skin is warm and dry.  Neurological:     Mental Status: She is alert.     Comments: Patient has chronic aphasia. She is awake, alert, follows command. She has right sided extremity weakness from previous stroke.      ED Results / Procedures / Treatments   Labs (all labs ordered are listed, but only abnormal results are displayed) Labs Reviewed - No data to display Results for orders placed or performed during the hospital encounter of 02/26/19  CBC with Differential  Result Value Ref Range   WBC 7.4 4.0 - 10.5 K/uL   RBC 3.78 (L) 3.87 - 5.11 MIL/uL   Hemoglobin 11.2 (L) 12.0 - 15.0 g/dL   HCT 33.1 (L) 36.0 - 46.0 %   MCV 87.6 80.0 - 100.0 fL   MCH 29.6 26.0 - 34.0 pg   MCHC 33.8 30.0 - 36.0 g/dL   RDW 12.9 11.5 - 15.5 %   Platelets 273 150 - 400 K/uL   nRBC 0.0 0.0 - 0.2 %   Neutrophils Relative % 55 %   Neutro Abs 4.1 1.7 - 7.7 K/uL   Lymphocytes Relative 32 %   Lymphs Abs 2.3 0.7 - 4.0 K/uL   Monocytes Relative 10 %   Monocytes Absolute 0.7 0.1 - 1.0 K/uL   Eosinophils Relative 3 %   Eosinophils Absolute 0.2 0.0 - 0.5 K/uL   Basophils Relative 0 %   Basophils Absolute 0.0 0.0 - 0.1 K/uL   Immature Granulocytes 0 %   Abs Immature Granulocytes 0.02 0.00 - 0.07 K/uL  Urinalysis, Routine w reflex microscopic  Result Value Ref Range   Color, Urine STRAW (A) YELLOW   APPearance CLEAR CLEAR   Specific Gravity, Urine 1.002 (L) 1.005 - 1.030   pH 6.0 5.0 - 8.0   Glucose, UA NEGATIVE NEGATIVE mg/dL   Hgb urine dipstick NEGATIVE NEGATIVE   Bilirubin Urine NEGATIVE NEGATIVE    Ketones, ur NEGATIVE NEGATIVE mg/dL   Protein, ur NEGATIVE NEGATIVE mg/dL   Nitrite NEGATIVE NEGATIVE   Leukocytes,Ua NEGATIVE NEGATIVE  Basic metabolic panel  Result Value Ref Range   Sodium 140 135 - 145 mmol/L  Potassium 3.1 (L) 3.5 - 5.1 mmol/L   Chloride 116 (H) 98 - 111 mmol/L   CO2 14 (L) 22 - 32 mmol/L   Glucose, Bld 79 70 - 99 mg/dL   BUN 18 8 - 23 mg/dL   Creatinine, Ser 0.48 0.44 - 1.00 mg/dL   Calcium 6.7 (L) 8.9 - 10.3 mg/dL   GFR calc non Af Amer >60 >60 mL/min   GFR calc Af Amer >60 >60 mL/min   Anion gap 10 5 - 15    EKG EKG Interpretation  Date/Time:  Wednesday February 26 2019 23:27:24 EST Ventricular Rate:  87 PR Interval:    QRS Duration: 133 QT Interval:  391 QTC Calculation: 468 R Axis:   147 Text Interpretation: Sinus rhythm Nonspecific intraventricular conduction delay ST elevation, consider inferior injury improved artifact from earlier in day between two ecg's it does not appear there are any significant changes compared to  aug 2018 Confirmed by Merrily Pew [phone]) on 02/26/2019 11:31:24 PM   Radiology No results found. CT Head Wo Contrast  Result Date: 02/27/2019 CLINICAL DATA:  Seizure EXAM: CT HEAD WITHOUT CONTRAST TECHNIQUE: Contiguous axial images were obtained from the base of the skull through the vertex without intravenous contrast. COMPARISON:  08/27/2016 FINDINGS: Brain: No focal abnormality affects the brainstem or cerebellum. Right cerebral hemisphere appears normal. There is old infarction in the left MCA territory affecting the insula and the frontoparietal brain. Ischemic changes may be slightly more extensive than were seen in 2018 but appear chronic. No sign of mass, hemorrhage, hydrocephalus or extra-axial collection. Vascular: There is atherosclerotic calcification of the major vessels at the base of the brain. Skull: Negative Sinuses/Orbits: Clear/normal Other: None IMPRESSION: No acute or reversible finding. Old infarction in the  left MCA territory with progression to encephalomalacia and gliosis. The ischemic changes may be slightly more extensive than were seen on the study of 2018, but do not appear recent. Electronically Signed   By: Nelson Chimes M.D.   On: 02/27/2019 01:08    Procedures Procedures (including critical care time)  Medications Ordered in ED Medications - No data to display  ED Course  I have reviewed the triage vital signs and the nursing notes.  Pertinent labs & imaging results that were available during my care of the patient were reviewed by me and considered in my medical decision making (see chart for details).    MDM Rules/Calculators/A&P                      Patient to ED by EMS with report of seizure tonight. Keppra loaded after arrival. Labs pending.    Family initially unavailable for collateral history but was contacted later and who report she had gotten up to the bathroom and family heard her fall to the ground, brother-in-law found her on the floor seizing. Lasted 2 minutes and occurred around 10:00 pm. Sister reports she gives her medications daily and has not missed any doses.   Head CT negative for acute findings. No seizure activity during ED visit. Will continue to observe.   Labs concerning for hypocalcemia of 6.7. Consider this as possible cause of seizure. She is also acidotic with CO2 14. Normal anion gap. Mild hypokalemia of 3.1.  Oral potassium provided. IVF's and calcium 1 gram provided. Plan to check calcium level, BMET after treatment to determine disposition.   Sister, Blanch Media, has been updated regarding plan.   Patient care signed out to [facility], PA-C, pending  re-evaluation of labs.   Final Clinical Impression(s) / ED Diagnoses Final diagnoses:  None   1. Seizure 2. Hypocalcemia 3. Hypokalemia  Rx / DC Orders ED Discharge Orders    None       Charlann Lange, Hershal Coria 02/27/19 [id number]    Mesner, Corene Cornea, MD 02/27/19 [phone]

## 2019-02-27 ENCOUNTER — Emergency Department (HOSPITAL_COMMUNITY): Payer: Medicare (Managed Care)

## 2019-02-27 DIAGNOSIS — R569 Unspecified convulsions: Secondary | ICD-10-CM | POA: Diagnosis not present

## 2019-02-27 LAB — CBC WITH DIFFERENTIAL/PLATELET
Abs Immature Granulocytes: 0.02 10*3/uL (ref 0.00–0.07)
Basophils Absolute: 0 10*3/uL (ref 0.0–0.1)
Basophils Relative: 0 %
Eosinophils Absolute: 0.2 10*3/uL (ref 0.0–0.5)
Eosinophils Relative: 3 %
HCT: 33.1 % — ABNORMAL LOW (ref 36.0–46.0)
Hemoglobin: 11.2 g/dL — ABNORMAL LOW (ref 12.0–15.0)
Immature Granulocytes: 0 %
Lymphocytes Relative: 32 %
Lymphs Abs: 2.3 10*3/uL (ref 0.7–4.0)
MCH: 29.6 pg (ref 26.0–34.0)
MCHC: 33.8 g/dL (ref 30.0–36.0)
MCV: 87.6 fL (ref 80.0–100.0)
Monocytes Absolute: 0.7 10*3/uL (ref 0.1–1.0)
Monocytes Relative: 10 %
Neutro Abs: 4.1 10*3/uL (ref 1.7–7.7)
Neutrophils Relative %: 55 %
Platelets: 273 10*3/uL (ref 150–400)
RBC: 3.78 MIL/uL — ABNORMAL LOW (ref 3.87–5.11)
RDW: 12.9 % (ref 11.5–15.5)
WBC: 7.4 10*3/uL (ref 4.0–10.5)
nRBC: 0 % (ref 0.0–0.2)

## 2019-02-27 LAB — BASIC METABOLIC PANEL
Anion gap: 10 (ref 5–15)
Anion gap: 8 (ref 5–15)
BUN: 18 mg/dL (ref 8–23)
BUN: 20 mg/dL (ref 8–23)
CO2: 14 mmol/L — ABNORMAL LOW (ref 22–32)
CO2: 20 mmol/L — ABNORMAL LOW (ref 22–32)
Calcium: 6.7 mg/dL — ABNORMAL LOW (ref 8.9–10.3)
Calcium: 8.6 mg/dL — ABNORMAL LOW (ref 8.9–10.3)
Chloride: 116 mmol/L — ABNORMAL HIGH (ref 98–111)
Chloride: 111 mmol/L (ref 98–111)
Creatinine, Ser: 0.48 mg/dL (ref 0.44–1.00)
Creatinine, Ser: 0.68 mg/dL (ref 0.44–1.00)
GFR calc Af Amer: >60 mL/min (ref >60)
GFR calc Af Amer: >60 mL/min (ref >60)
GFR calc non Af Amer: >60 mL/min (ref >60)
GFR calc non Af Amer: >60 mL/min (ref >60)
Glucose, Bld: 79 mg/dL (ref 70–99)
Glucose, Bld: 86 mg/dL (ref 70–99)
Potassium: 3.1 mmol/L — ABNORMAL LOW (ref 3.5–5.1)
Potassium: 4.7 mmol/L (ref 3.5–5.1)
Sodium: 140 mmol/L (ref 135–145)
Sodium: 139 mmol/L (ref 135–145)

## 2019-02-27 LAB — URINALYSIS, ROUTINE W REFLEX MICROSCOPIC
Bilirubin Urine: NEGATIVE
Glucose, UA: NEGATIVE mg/dL
Hgb urine dipstick: NEGATIVE
Ketones, ur: NEGATIVE mg/dL
Leukocytes,Ua: NEGATIVE
Nitrite: NEGATIVE
Protein, ur: NEGATIVE mg/dL
Specific Gravity, Urine: 1.002 — ABNORMAL LOW (ref 1.005–1.030)
pH: 6 (ref 5.0–8.0)

## 2019-02-27 LAB — CALCIUM: Calcium: 8.7 mg/dL — ABNORMAL LOW (ref 8.9–10.3)

## 2019-02-27 MED ORDER — SODIUM CHLORIDE 0.9 % IV SOLN: 1.0000 g | Freq: Once | INTRAVENOUS | Status: DC

## 2019-02-27 MED ORDER — CALCIUM CARBONATE 1250 (500 CA) MG PO TABS: 1.0000 | ORAL_TABLET | Freq: Every day | ORAL | 0 refills | Status: AC

## 2019-02-27 MED ORDER — POTASSIUM CHLORIDE CRYS ER 20 MEQ PO TBCR
40.0000 meq | EXTENDED_RELEASE_TABLET | Freq: Once | ORAL | Status: AC
  Administered 2019-02-27: 04:00:00 40 meq via ORAL
  Filled 2019-02-27: qty 2

## 2019-02-27 MED ORDER — SODIUM CHLORIDE 0.9 % IV BOLUS: 1000.0000 mL | Freq: Once | INTRAVENOUS | Status: DC

## 2019-02-27 MED ORDER — POTASSIUM CHLORIDE CRYS ER 20 MEQ PO TBCR: 20.0000 meq | EXTENDED_RELEASE_TABLET | Freq: Every day | ORAL | 0 refills | Status: AC

## 2019-02-27 MED ORDER — CALCIUM GLUCONATE-NACL 1-0.675 GM/50ML-% IV SOLN
1.0000 g | Freq: Once | INTRAVENOUS | Status: AC
  Administered 2019-02-27: 04:00:00 1000 mg via INTRAVENOUS
  Filled 2019-02-27: qty 50

## 2019-02-27 NOTE — ED Notes (Signed)
Pt transported to CT ?

## 2019-02-27 NOTE — ED Notes (Signed)
Patient Alert and oriented to baseline. Stable and ambulatory to baseline. Patient verbalized understanding of the discharge instructions.  Patient belongings were taken by the patient.   

## 2019-02-27 NOTE — Discharge Instructions (Addendum)
You were seen in the emergency department today after a seizure.  Your labs show that your calcium and potassium are low, you were given these in the emergency department and we are sending you home with supplements of both these medicines.  Your bicarb is not a lab we checked was low but improved with the fluids and supplements given in the emergency department.  Please be sure to take your seizure medication as prescribed.  Please take the supplements as prescribed.  Please follow-up with your primary care provider for a recheck of your labs (a basic metabolic panel) as well as with your neurologist for your seizure.  Do not drive or operate heavy machinery until seen by your neurologist after your seizure.  Return to the emergency department for new or worsening symptoms including but not limited to recurrent seizure activity, numbness, tingling, weakness, passing out, heart palpitations, chest pain, trouble breathing, or any other concerns.

## 2019-02-27 NOTE — ED Provider Notes (Signed)
07:15: Assumed care from [facility] PA-C at change of shift pending repeat BMP.   Please see prior provider note for full H&P.  Briefly patient is a 64 yo female with a history of prior seizures on keppra & prior stroke with subsequent aphasia limiting ability to provide history who presented to the ED s/p seizure last night. 1 seizure, 2 minutes in duration, no known injury. Family initially not available for further info, subsequently discussed with and confirmed patient has been taking her meds.    Results for orders placed or performed during the hospital encounter of 02/26/19  CBC with Differential  Result Value Ref Range   WBC 7.4 4.0 - 10.5 K/uL   RBC 3.78 (L) 3.87 - 5.11 MIL/uL   Hemoglobin 11.2 (L) 12.0 - 15.0 g/dL   HCT 33.1 (L) 36.0 - 46.0 %   MCV 87.6 80.0 - 100.0 fL   MCH 29.6 26.0 - 34.0 pg   MCHC 33.8 30.0 - 36.0 g/dL   RDW 12.9 11.5 - 15.5 %   Platelets 273 150 - 400 K/uL   nRBC 0.0 0.0 - 0.2 %   Neutrophils Relative % 55 %   Neutro Abs 4.1 1.7 - 7.7 K/uL   Lymphocytes Relative 32 %   Lymphs Abs 2.3 0.7 - 4.0 K/uL   Monocytes Relative 10 %   Monocytes Absolute 0.7 0.1 - 1.0 K/uL   Eosinophils Relative 3 %   Eosinophils Absolute 0.2 0.0 - 0.5 K/uL   Basophils Relative 0 %   Basophils Absolute 0.0 0.0 - 0.1 K/uL   Immature Granulocytes 0 %   Abs Immature Granulocytes 0.02 0.00 - 0.07 K/uL  Urinalysis, Routine w reflex microscopic  Result Value Ref Range   Color, Urine STRAW (A) YELLOW   APPearance CLEAR CLEAR   Specific Gravity, Urine 1.002 (L) 1.005 - 1.030   pH 6.0 5.0 - 8.0   Glucose, UA NEGATIVE NEGATIVE mg/dL   Hgb urine dipstick NEGATIVE NEGATIVE   Bilirubin Urine NEGATIVE NEGATIVE   Ketones, ur NEGATIVE NEGATIVE mg/dL   Protein, ur NEGATIVE NEGATIVE mg/dL   Nitrite NEGATIVE NEGATIVE   Leukocytes,Ua NEGATIVE NEGATIVE  Basic metabolic panel  Result Value Ref Range   Sodium 140 135 - 145 mmol/L   Potassium 3.1 (L) 3.5 - 5.1 mmol/L   Chloride 116  (H) 98 - 111 mmol/L   CO2 14 (L) 22 - 32 mmol/L   Glucose, Bld 79 70 - 99 mg/dL   BUN 18 8 - 23 mg/dL   Creatinine, Ser 0.48 0.44 - 1.00 mg/dL   Calcium 6.7 (L) 8.9 - 10.3 mg/dL   GFR calc non Af Amer >60 >60 mL/min   GFR calc Af Amer >60 >60 mL/min   Anion gap 10 5 - 15  Calcium  Result Value Ref Range   Calcium 8.7 (L) 8.9 - 10.3 mg/dL   CT Head Wo Contrast  Result Date: 02/27/2019 CLINICAL DATA:  Seizure EXAM: CT HEAD WITHOUT CONTRAST TECHNIQUE: Contiguous axial images were obtained from the base of the skull through the vertex without intravenous contrast. COMPARISON:  08/27/2016 FINDINGS: Brain: No focal abnormality affects the brainstem or cerebellum. Right cerebral hemisphere appears normal. There is old infarction in the left MCA territory affecting the insula and the frontoparietal brain. Ischemic changes may be slightly more extensive than were seen in 2018 but appear chronic. No sign of mass, hemorrhage, hydrocephalus or extra-axial collection. Vascular: There is atherosclerotic calcification of the major vessels at the base  of the brain. Skull: Negative Sinuses/Orbits: Clear/normal Other: None IMPRESSION: No acute or reversible finding. Old infarction in the left MCA territory with progression to encephalomalacia and gliosis. The ischemic changes may be slightly more extensive than were seen on the study of 2018, but do not appear recent. Electronically Signed   By: Nelson Chimes M.D.   On: 02/27/2019 01:08    Procedures  MDM   CT head: No acute or reversible finding. Old infarction in the left MCA territory with progression to encephalomalacia and gliosis. The ischemic changes may be slightly more extensive than were seen on the study of 2018, but do not appear recent.   Initial labs:  CBC: anemia improved from prior.  BMP: Hypokalemia- orally replaced (will DC home with additional oral supplement), hypocalcemia- improved with supplementation in the ED w/ repeat calcium level.  Low bicarb, normal anion gap.   Given PO potassium, fluids, and IV calcium   Plan @ change of shift is to follow up on BMP--> if improved bicab can be discharged with supplemental potassium/calcium.   BMP w/ improved potassium & bicarb. Discharge home with supplementation as discussed. I discussed results, treatment plan, need for follow-up, and return precautions with the patient & her sister via telephone. Provided opportunity for questions, confirmed understanding and are in agreement with plan.           Tina Dyke, PA-C 02/27/19 1001    Maudie Flakes, MD 03/01/19 [phone]

## 2019-08-07 ENCOUNTER — Other Ambulatory Visit: Payer: Self-pay | Admitting: Family Medicine

## 2019-08-07 DIAGNOSIS — Z1231 Encounter for screening mammogram for malignant neoplasm of breast: Secondary | ICD-10-CM

## 2019-08-12 ENCOUNTER — Ambulatory Visit: Payer: Medicare (Managed Care) | Admitting: Obstetrics and Gynecology

## 2019-08-21 ENCOUNTER — Other Ambulatory Visit: Payer: Self-pay

## 2019-08-21 ENCOUNTER — Ambulatory Visit
Admission: RE | Admit: 2019-08-21 | Discharge: 2019-08-21 | Disposition: A | Discharge status: Home / Self Care | Payer: Medicare (Managed Care) | Source: Ambulatory Visit | Attending: Family Medicine | Admitting: Family Medicine

## 2019-08-21 DIAGNOSIS — Z1231 Encounter for screening mammogram for malignant neoplasm of breast: Secondary | ICD-10-CM

## 2019-08-27 ENCOUNTER — Ambulatory Visit: Payer: Medicare (Managed Care) | Admitting: Obstetrics and Gynecology

## 2019-08-29 ENCOUNTER — Other Ambulatory Visit (HOSPITAL_COMMUNITY): Payer: Self-pay | Admitting: Nurse Practitioner

## 2019-08-29 ENCOUNTER — Telehealth (HOSPITAL_COMMUNITY): Payer: Self-pay | Admitting: Nurse Practitioner

## 2019-08-29 DIAGNOSIS — U071 COVID-19: Secondary | ICD-10-CM

## 2019-08-29 NOTE — Progress Notes (Signed)
I connected by phone with Tina Sanders on 08/29/2019 at 11:19 AM to discuss the potential use of an new treatment for mild to moderate COVID-19 viral infection in non-hospitalized patients.  This patient is a 64 y.o. female that meets the FDA criteria for Emergency Use Authorization of casirivimab\imdevimab.  Has a (+) direct SARS-CoV-2 viral test result  Has mild or moderate COVID-19   Is ? 66 years of age and weighs ? 40 kg  Is NOT hospitalized due to COVID-19  Is NOT requiring oxygen therapy or requiring an increase in baseline oxygen flow rate due to COVID-19  Is within 10 days of symptom onset  Has at least one of the high risk factor(s) for progression to severe COVID-19 and/or hospitalization as defined in EUA.  Specific high risk criteria : Older age (>/= 66 yo), copd, hptn.    I have spoken and communicated the following to the patient or parent/caregiver:  1. FDA has authorized the emergency use of casirivimab\imdevimab for the treatment of mild to moderate COVID-19 in adults and pediatric patients with positive results of direct SARS-CoV-2 viral testing who are 64 years of age and older weighing at least 40 kg, and who are at high risk for progressing to severe COVID-19 and/or hospitalization.  2. The significant known and potential risks and benefits of casirivimab\imdevimab, and the extent to which such potential risks and benefits are unknown.  3. Information on available alternative treatments and the risks and benefits of those alternatives, including clinical trials.  4. Patients treated with casirivimab\imdevimab should continue to self-isolate and use infection control measures (e.g., wear mask, isolate, social distance, avoid sharing personal items, clean and disinfect high touch surfaces, and frequent handwashing) according to CDC guidelines.   5. The patient or parent/caregiver has the option to accept or refuse casirivimab\imdevimab .  After reviewing this  information with the patient, The patient agreed to proceed with receiving casirivimab\imdevimab infusion and will be provided a copy of the Fact sheet prior to receiving the infusion.Beckey Rutter, [place], AGNP-C [phone] ([place])

## 2019-08-29 NOTE — Telephone Encounter (Signed)
Called to discuss with Tina Sanders about Covid symptoms and the use of regeneron, a monoclonal antibody infusion for those with mild to moderate Covid symptoms and at a high risk of hospitalization.  I spoke to her sister and caregiver who was unaware that the patient had tested positive. I spoke to Dr. Bradd Burner at [facility] who confirms that patient was vaccinated and tested positive on 8/18. He will discuss with family. I spoke to Blanch Media, patient's caregiver who says patient became symptomatic last night. Assisted in making appointment for family members to be tested. See orders only encounter for further orders for MAB.

## 2019-08-30 ENCOUNTER — Ambulatory Visit (HOSPITAL_COMMUNITY): Admission: RE | Admit: 2019-08-30 | Payer: Medicare (Managed Care) | Source: Ambulatory Visit

## 2019-08-31 ENCOUNTER — Other Ambulatory Visit: Payer: Self-pay | Admitting: Adult Health

## 2019-08-31 ENCOUNTER — Ambulatory Visit (HOSPITAL_COMMUNITY)
Admission: RE | Admit: 2019-08-31 | Discharge: 2019-08-31 | Disposition: A | Discharge status: Home / Self Care | Payer: Medicare Other | Source: Ambulatory Visit | Attending: Pulmonary Disease | Admitting: Pulmonary Disease

## 2019-08-31 DIAGNOSIS — Z23 Encounter for immunization: Secondary | ICD-10-CM | POA: Diagnosis not present

## 2019-08-31 DIAGNOSIS — U071 COVID-19: Secondary | ICD-10-CM | POA: Insufficient documentation

## 2019-08-31 MED ORDER — ALBUTEROL SULFATE HFA 108 (90 BASE) MCG/ACT IN AERS: 2.0000 | INHALATION_SPRAY | Freq: Once | RESPIRATORY_TRACT | Status: DC | PRN

## 2019-08-31 MED ORDER — SODIUM CHLORIDE 0.9 % IV SOLN: INTRAVENOUS | Status: DC | PRN

## 2019-08-31 MED ORDER — FAMOTIDINE IN NACL 20-0.9 MG/50ML-% IV SOLN: 20.0000 mg | Freq: Once | INTRAVENOUS | Status: DC | PRN

## 2019-08-31 MED ORDER — METHYLPREDNISOLONE SODIUM SUCC 125 MG IJ SOLR: 125.0000 mg | Freq: Once | INTRAMUSCULAR | Status: DC | PRN

## 2019-08-31 MED ORDER — EPINEPHRINE 0.3 MG/0.3ML IJ SOAJ: 0.3000 mg | Freq: Once | INTRAMUSCULAR | Status: DC | PRN

## 2019-08-31 MED ORDER — SODIUM CHLORIDE 0.9 % IV SOLN
1200.0000 mg | Freq: Once | INTRAVENOUS | Status: AC
  Administered 2019-08-31: 1200 mg via INTRAVENOUS
  Filled 2019-08-31: qty 10

## 2019-08-31 MED ORDER — DIPHENHYDRAMINE HCL 50 MG/ML IJ SOLN: 50.0000 mg | Freq: Once | INTRAMUSCULAR | Status: DC | PRN

## 2019-08-31 NOTE — Progress Notes (Signed)
I connected by phone with Andreas Blower on 08/31/2019 at 11:04 AM to discuss the potential use of a new treatment for mild to moderate COVID-19 viral infection in non-hospitalized patients.  This patient is a 64 y.o. female that meets the FDA criteria for Emergency Use Authorization of COVID monoclonal antibody casirivimab/imdevimab.  Has a (+) direct SARS-CoV-2 viral test result  Has mild or moderate COVID-19   Is NOT hospitalized due to COVID-19  Is within 10 days of symptom onset  Has at least one of the high risk factor(s) for progression to severe COVID-19 and/or hospitalization as defined in EUA.  Specific high risk criteria : Older age (>/= 66 yo)   I have spoken and communicated the following to the patient or parent/caregiver regarding COVID monoclonal antibody treatment:  1. FDA has authorized the emergency use for the treatment of mild to moderate COVID-19 in adults and pediatric patients with positive results of direct SARS-CoV-2 viral testing who are 63 years of age and older weighing at least 40 kg, and who are at high risk for progressing to severe COVID-19 and/or hospitalization.  2. The significant known and potential risks and benefits of COVID monoclonal antibody, and the extent to which such potential risks and benefits are unknown.  3. Information on available alternative treatments and the risks and benefits of those alternatives, including clinical trials.  4. Patients treated with COVID monoclonal antibody should continue to self-isolate and use infection control measures (e.g., wear mask, isolate, social distance, avoid sharing personal items, clean and disinfect high touch surfaces, and frequent handwashing) according to CDC guidelines.   5. The patient or parent/caregiver has the option to accept or refuse COVID monoclonal antibody treatment.  After reviewing this information with the patient, The patient agreed to proceed with receiving casirivimab\imdevimab  infusion and will be provided a copy of the Fact sheet prior to receiving the infusion. Scot Dock 08/31/2019 11:04 AM

## 2019-08-31 NOTE — Discharge Instructions (Signed)

## 2019-08-31 NOTE — Progress Notes (Signed)
°  Diagnosis: COVID-19  Physician: Dr. Asencion Noble  Procedure: Covid Infusion Clinic Med: casirivimab\imdevimab infusion - Provided patient with casirivimab\imdevimab fact sheet for patients, parents and caregivers prior to infusion.  Complications: No immediate complications noted.  Discharge: Discharged home   Tina Sanders, Tina Sanders 08/31/2019

## 2019-09-16 ENCOUNTER — Ambulatory Visit: Payer: Medicare (Managed Care) | Admitting: Obstetrics and Gynecology

## 2019-10-02 ENCOUNTER — Other Ambulatory Visit: Payer: Self-pay

## 2019-10-02 ENCOUNTER — Encounter: Payer: Self-pay | Admitting: Obstetrics and Gynecology

## 2019-10-02 ENCOUNTER — Ambulatory Visit (INDEPENDENT_AMBULATORY_CARE_PROVIDER_SITE_OTHER): Payer: Medicare (Managed Care) | Admitting: Obstetrics and Gynecology

## 2019-10-02 VITALS — BP 110/66 | Ht 63.0 in | Wt 119.0 lb

## 2019-10-02 DIAGNOSIS — Z124 Encounter for screening for malignant neoplasm of cervix: Secondary | ICD-10-CM | POA: Diagnosis not present

## 2019-10-02 DIAGNOSIS — Z1272 Encounter for screening for malignant neoplasm of vagina: Secondary | ICD-10-CM

## 2019-10-02 DIAGNOSIS — Z13820 Encounter for screening for osteoporosis: Secondary | ICD-10-CM | POA: Diagnosis not present

## 2019-10-02 DIAGNOSIS — Z01419 Encounter for gynecological examination (general) (routine) without abnormal findings: Secondary | ICD-10-CM | POA: Diagnosis not present

## 2019-10-02 NOTE — Progress Notes (Signed)
Ekta Dancer Jul 30, 1955 [id number]  SUBJECTIVE:  64 y.o. G0P0000 female new patient here for annual routine gynecologic exam and Pap smear. She has no gynecologic concerns.  Denies vaginal bleeding, discharge, pelvic pain, excessive vaginal dryness, hot flashes, night sweats.  No breast concerns.  The patient has had a stroke and is less conversive so her sister is with her today to help with discussing her health history.  Current Outpatient Medications  Medication Sig Dispense Refill  . acetaminophen (TYLENOL) 500 MG tablet Take 500-1,000 mg every 6 (six) hours as needed by mouth.    Marland Kitchen aspirin EC 81 MG tablet Take 81 mg by mouth daily.    Marland Kitchen atorvastatin (LIPITOR) 10 MG tablet Take 10 mg by mouth daily at 6 PM.    . calcium carbonate (OS-CAL - DOSED IN MG OF ELEMENTAL CALCIUM) 1250 (500 Ca) MG tablet Take 1 tablet (500 mg of elemental calcium total) by mouth daily with breakfast. 7 tablet 0  . cetirizine (ZYRTEC) 10 MG tablet Take 10 mg by mouth daily.    Marland Kitchen lacosamide (VIMPAT) 200 MG TABS tablet Take 1 tablet (200 mg total) by mouth 2 (two) times daily. 60 tablet 0  . levETIRAcetam (KEPPRA) 750 MG tablet Take 1 tablet (750 mg total) by mouth 2 (two) times daily. 180 tablet 3  . lisinopril (ZESTRIL) 10 MG tablet Take 10 mg by mouth daily.    . Menthol, Topical Analgesic, (BIOFREEZE EX) Apply topically. Uses BID prn    . mirtazapine (REMERON) 15 MG tablet Take 15 mg at bedtime by mouth.    Marland Kitchen OVER THE COUNTER MEDICATION Laxative plu: 2 tabs once daily at bedtime for constipation    . potassium chloride SA (KLOR-CON) 20 MEQ tablet Take 1 tablet (20 mEq total) by mouth daily. 5 tablet 0  . UNKNOWN TO PATIENT Protein shake by mouth twice daily.     No current facility-administered medications for this visit.   Allergies: Penicillins and Nicotine  No LMP recorded. Patient is postmenopausal.  Past medical history,surgical history, problem list, medications, allergies, family history and  social history were all reviewed and documented as reviewed in the EPIC chart.  ROS:  Feeling well. No dyspnea or chest pain on exertion.  No abdominal pain, change in bowel habits, black or bloody stools.  No urinary tract symptoms. GYN ROS: no abnormal bleeding, pelvic pain or discharge, no breast pain or new or enlarging lumps on self exam.No neurological complaints.    OBJECTIVE:  BP 110/66   Ht 5\' 3"  (1.6 m)   Wt 119 lb (54 kg)   BMI 21.08 kg/m  The patient appears well, alert, oriented x 3, in no distress. ENT normal.  Neck supple. No cervical or supraclavicular adenopathy or thyromegaly.  Lungs are clear, good air entry, no wheezes, rhonchi or rales. S1 and S2 normal, no murmurs, regular rate and rhythm.  Abdomen soft without tenderness, guarding, mass or organomegaly.  Neurological is normal, no focal findings.  BREAST EXAM: breasts appear normal, no suspicious masses, no skin or nipple changes or axillary nodes  PELVIC EXAM: VULVA: normal appearing vulva with atrophic change, no masses, tenderness or lesions, VAGINA: normal appearing vagina with atrophic change, normal color and discharge, no lesions, CERVIX: surgically absent, UTERUS: surgically absent, vaginal cuff normal, ADNEXA: normal adnexa in size, nontender and no masses, PAP: Pap smear done today on vaginal cuff, thin-prep method  Chaperone: Caryn Bee present during the examination  ASSESSMENT:  64 y.o. G0P0000 here for  annual gynecologic exam  PLAN:   1. Postmenopausal.  No significant hot flashes or night sweats.   No vaginal bleeding. 2. Pap smear uncertain of when this was done.  Pap smear collected today.   3. It appears the patient had a hysterectomy based on her exam, no palpable ovaries but she is menopausal so uncertain if she had retained her ovaries or not from her given history. 4. Mammogram 08/2019.  Normal breast exam today.  Continue with annual mammograms. 5. Colonoscopy. Uncertain of when this was  done.  Encouraged her sister to help her check with [facility] on this to coordinate. 6. DEXA never.  DEXA test recommended and explained so she plans to schedule this. 7. Health maintenance.  No labs today as she normally has these completed elsewhere.  Return annually or sooner, prn.  Joseph Pierini MD 10/02/19

## 2019-10-03 LAB — PAP IG W/ RFLX HPV ASCU

## 2020-05-10 ENCOUNTER — Other Ambulatory Visit: Payer: Self-pay

## 2020-05-10 ENCOUNTER — Ambulatory Visit (INDEPENDENT_AMBULATORY_CARE_PROVIDER_SITE_OTHER): Payer: Medicare (Managed Care) | Admitting: Gastroenterology

## 2020-05-10 VITALS — BP 100/58 | HR 76 | Ht 62.0 in | Wt 115.0 lb

## 2020-05-10 DIAGNOSIS — G40909 Epilepsy, unspecified, not intractable, without status epilepticus: Secondary | ICD-10-CM | POA: Diagnosis not present

## 2020-05-10 DIAGNOSIS — I6930 Unspecified sequelae of cerebral infarction: Secondary | ICD-10-CM

## 2020-05-10 DIAGNOSIS — Z1211 Encounter for screening for malignant neoplasm of colon: Secondary | ICD-10-CM | POA: Diagnosis not present

## 2020-05-10 MED ORDER — PLENVU 140 G PO SOLR: 140.0000 g | ORAL | 0 refills | Status: DC

## 2020-05-10 NOTE — Patient Instructions (Signed)
If you are age 67 or older, your body mass index should be between 23-30. Your Body mass index is 21.03 kg/m. If this is out of the aforementioned range listed, please consider follow up with your Primary Care Provider.  If you are age 22 or younger, your body mass index should be between 19-25. Your Body mass index is 21.03 kg/m. If this is out of the aformentioned range listed, please consider follow up with your Primary Care Provider.   You have been scheduled for a colonoscopy. Please follow written instructions given to you at your visit today.  Please pick up your prep supplies at the pharmacy within the next 1-3 days. If you use inhalers (even only as needed), please bring them with you on the day of your procedure.  It was a pleasure to see you today!  Thank you for trusting me with your gastrointestinal care!

## 2020-05-10 NOTE — Progress Notes (Signed)
[facility] Consult Note:  History: Tina Sanders 05/10/2020  Referring provider: Barney Drain, MD ([facility])  Reason for consult/chief complaint: Colon Cancer Screening (Pt states she has never had a colonoscopy; has no GI complaints at this time)   Subjective  HPI:  This is a very pleasant 65 year old woman accompanied by her sister Tina Sanders who is also her caregiver in the same house.  Tina Sanders was referred by the pace care team for colon cancer screening.  She has not had previous screening done such as colonoscopy or stool based therapy.  She does not have chronic constipation or rectal bleeding.  She rarely has difficulty giving history due to her expressive aphasia, so her sister assists with the history. She originally had significant dysphagia after the stroke, but with therapy and time she has accommodated to that and is now able to eat and drink without difficulty.  She does not feel food or liquid stuck in the throat or chest and does not cough while eating.  ROS:  Review of Systems  Constitutional: Negative for appetite change and unexpected weight change.  HENT: Negative for mouth sores and voice change.   Eyes: Negative for pain and redness.  Respiratory: Negative for cough and shortness of breath.   Cardiovascular: Negative for chest pain and palpitations.  Genitourinary: Negative for dysuria and hematuria.  Musculoskeletal: Negative for arthralgias and myalgias.  Skin: Negative for pallor and rash.  Neurological: Negative for weakness and headaches.       Expressive aphasia and right-sided weakness  Hematological: Negative for adenopathy.     Past Medical History: Past Medical History:  Diagnosis Date  . Alcohol abuse   . B12 deficiency   . CVA (cerebral vascular accident) ([place])   . Dysarthria   . Family history of drug abuse   . GERD (gastroesophageal reflux disease)   . History of fall   . Hypertension   . IBS (irritable  bowel syndrome)   . Seizures ([place])   . 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 Stroke [facility])    residual right sided deficits, expressive dysphasia  . Toe deformity   . Vitamin D deficiency disease   . Vitamin E deficiency    Last neurology office note from 12/03/2018 reviewed.  No seizure in years  Past Surgical History: Past Surgical History:  Procedure Laterality Date  . ABDOMINAL HYSTERECTOMY       Family History: Family History  Problem Relation Age of Onset  . Hypertension Mother   . Hypertension Father   . Kidney disease Father   . Hyperlipidemia Father   . Hypertension Sister   . Diabetes Sister     Social History: Social History   Socioeconomic History  . Marital status: Single    Spouse name: Not on file  . Number of children: 0  . Years of education: HS  . Highest education level: Not on file  Occupational History  . Occupation: Disabled  Tobacco Use  . Smoking status: Current Some Day Smoker    Packs/day: 0.25    Years: 40.00    Pack years: 10.00    Types: Cigarettes  . Smokeless tobacco: Never Used  Vaping Use  . Vaping Use: Never used  Substance and Sexual Activity  . Alcohol use: No  . Drug use: Not Currently  . Sexual activity: Never    Comment: 1st intercourse teenager-? partners  Other Topics Concern  . Not on file  Social History Narrative   Lives  at home with daughter, Tina Sanders.  Attends [employer].     Right-handed.   No caffeine use.   Social Determinants of Health   Financial Resource Strain: Not on file  Food Insecurity: Not on file  Transportation Needs: Not on file  Physical Activity: Not on file  Stress: Not on file  Social Connections: Not on file    Allergies: Allergies  Allergen Reactions  . Penicillins Other (See Comments)    abdominal bloating Has patient had a PCN reaction causing immediate rash, facial/tongue/throat swelling, SOB or lightheadedness with hypotension: No Has patient had a PCN reaction causing severe rash  involving mucus membranes or skin necrosis: No Has patient had a PCN reaction that required hospitalization No Has patient had a PCN reaction occurring within the last 10 years: No If all of the above answers are "NO", then may proceed with Cephalosporin use.   . Nicotine Itching, Dermatitis and Rash    Outpatient Meds: Current Outpatient Medications  Medication Sig Dispense Refill  . acetaminophen (TYLENOL) 500 MG tablet Take 500-1,000 mg every 6 (six) hours as needed by mouth.    Marland Kitchen aspirin EC 81 MG tablet Take 81 mg by mouth daily.    Marland Kitchen atorvastatin (LIPITOR) 10 MG tablet Take 10 mg by mouth daily at 6 PM.    . calcium carbonate (OS-CAL - DOSED IN MG OF ELEMENTAL CALCIUM) 1250 (500 Ca) MG tablet Take 1 tablet (500 mg of elemental calcium total) by mouth daily with breakfast. 7 tablet 0  . cetirizine (ZYRTEC) 10 MG tablet Take 10 mg by mouth daily.    Marland Kitchen lacosamide (VIMPAT) 200 MG TABS tablet Take 1 tablet (200 mg total) by mouth 2 (two) times daily. 60 tablet 0  . levETIRAcetam (KEPPRA) 750 MG tablet Take 1 tablet (750 mg total) by mouth 2 (two) times daily. 180 tablet 3  . lisinopril (ZESTRIL) 10 MG tablet Take 10 mg by mouth daily.    . Menthol, Topical Analgesic, (BIOFREEZE EX) Apply topically. Uses BID prn    . mirtazapine (REMERON) 15 MG tablet Take 15 mg at bedtime by mouth.    Marland Kitchen OVER THE COUNTER MEDICATION Laxative plu: 2 tabs once daily at bedtime for constipation    . potassium chloride SA (KLOR-CON) 20 MEQ tablet Take 1 tablet (20 mEq total) by mouth daily. 5 tablet 0  . UNKNOWN TO PATIENT Protein shake by mouth twice daily.     No current facility-administered medications for this visit.      ___________________________________________________________________ Objective   Exam:  BP (!) 100/58   Pulse 76   Ht 5\' 2"  (1.575 m)   Wt 115 lb (52.2 kg)   BMI 21.03 kg/m  Wt Readings from Last 3 Encounters:  05/10/20 115 lb (52.2 kg)  10/02/19 119 lb (54 kg)  12/03/18  119 lb 12.8 oz (54.3 kg)     General: Pleasant and interactive.  Can say some words but is frustrated by her expressive aphasia.  She is able to express her comprehension nonverbally  Eyes: sclera anicteric, no redness  ENT: oral mucosa moist without lesions, no cervical or supraclavicular lymphadenopathy  CV: RRR without murmur, S1/S2, no JVD, no peripheral edema  Resp: clear to auscultation bilaterally, normal RR and effort noted  GI: soft, no tenderness, with active bowel sounds. No guarding or palpable organomegaly noted.  Skin; warm and dry, no rash or jaundice noted  Neuro: awake, alert and interactive.  Right facial droop, right hemiparesis.  She is able  to ambulate well with a walker, stand on her own, and get on exam table without assistance.  Labs: No data for review Assessment: Encounter Diagnoses  Name Primary?  . Special screening for malignant neoplasms, colon Yes  . Seizure disorder ([place])   . History of CVA with residual deficit     Average risk for colorectal cancer, no prior screening. CVA with residual motor and verbal deficit.  However, she remains highly functional and I believe she can undergo bowel preparation and sedation with acceptable risk.  Her sister confirms that she could help surely get through the bowel preparation at home, and her transportation would be arranged by the pace care team.  [place] Derry sister gives consent for medical procedures.  Procedure was described in detail along with risks and benefits and they were agreeable.  The benefits and risks of the planned procedure were described in detail with the patient or (when appropriate) their health care proxy.  Risks were outlined as including, but not limited to, bleeding, infection, perforation, adverse medication reaction leading to cardiac or pulmonary decompensation, pancreatitis (if ERCP).  The limitation of incomplete mucosal visualization was also discussed.  No guarantees or warranties  were given. Patient at increased risk for cardiopulmonary complications of procedure due to medical comorbidities.   Thank you for the courtesy of this consult.  Please call me with any questions or concerns.  Nelida Meuse III  CC: Referring provider noted above

## 2020-06-08 ENCOUNTER — Other Ambulatory Visit: Payer: Self-pay | Admitting: Obstetrics and Gynecology

## 2020-06-08 DIAGNOSIS — Z01419 Encounter for gynecological examination (general) (routine) without abnormal findings: Secondary | ICD-10-CM

## 2020-06-08 DIAGNOSIS — Z13820 Encounter for screening for osteoporosis: Secondary | ICD-10-CM

## 2020-06-11 ENCOUNTER — Other Ambulatory Visit: Payer: Medicare Other

## 2020-06-11 ENCOUNTER — Telehealth: Payer: Self-pay | Admitting: Gastroenterology

## 2020-06-11 ENCOUNTER — Other Ambulatory Visit: Payer: Self-pay

## 2020-06-11 NOTE — Telephone Encounter (Signed)
Inbound call from [place] with [facility] stating patient has not received prep medication and is wanting to know if script can be set to them.  Please advise.

## 2020-06-11 NOTE — Telephone Encounter (Signed)
Spoke to [place] and informed her that the patients sister and caregiver will come to the office today before 5 pm to pick up the sample kit at the front desk.

## 2020-06-14 ENCOUNTER — Ambulatory Visit (AMBULATORY_SURGERY_CENTER): Payer: Medicare (Managed Care) | Admitting: Gastroenterology

## 2020-06-14 ENCOUNTER — Other Ambulatory Visit: Payer: Self-pay

## 2020-06-14 ENCOUNTER — Encounter: Payer: Self-pay | Admitting: Gastroenterology

## 2020-06-14 VITALS — BP 110/61 | HR 89 | Temp 97.9°F | Resp 15 | Ht 62.0 in | Wt 115.0 lb

## 2020-06-14 DIAGNOSIS — D123 Benign neoplasm of transverse colon: Secondary | ICD-10-CM

## 2020-06-14 DIAGNOSIS — Z1211 Encounter for screening for malignant neoplasm of colon: Secondary | ICD-10-CM

## 2020-06-14 MED ORDER — SODIUM CHLORIDE 0.9 % IV SOLN: 500.0000 mL | Freq: Once | INTRAVENOUS | Status: AC

## 2020-06-14 NOTE — Op Note (Signed)
[facility] Patient Name: Tina Sanders Procedure Date: 06/14/2020 9:19 AM MRN: [id number] Endoscopist: [place]. Loletha Carrow , MD Age: 65 Referring MD:  Date of Birth: [birth year] Gender: Female Account #: [id number] Procedure:                Colonoscopy Indications:              Screening for colorectal malignant neoplasm, This                            is the patient's first colonoscopy Medicines:                Monitored Anesthesia Care Procedure:                Pre-Anesthesia Assessment:                           - Prior to the procedure, a History and Physical                            was performed, and patient medications and                            allergies were reviewed. The patient's tolerance of                            previous anesthesia was also reviewed. The risks                            and benefits of the procedure and the sedation                            options and risks were discussed with the patient.                            All questions were answered, and informed consent                            was obtained. Prior Anticoagulants: The patient has                            taken no previous anticoagulant or antiplatelet                            agents. ASA Grade Assessment: III - A patient with                            severe systemic disease. After reviewing the risks                            and benefits, the patient was deemed in                            satisfactory condition to undergo the procedure.  After obtaining informed consent, the colonoscope                            was passed under direct vision. Throughout the                            procedure, the patient's blood pressure, pulse, and                            oxygen saturations were monitored continuously. The                            Olympus CF-HQ190L [id number]) Colonoscope was                            introduced through the anus  and advanced to the the                            cecum, identified by appendiceal orifice and                            ileocecal valve. The colonoscopy was performed                            without difficulty. The patient tolerated the                            procedure well. The quality of the bowel                            preparation was good. The ileocecal valve,                            appendiceal orifice, and rectum were photographed. Scope In: 9:34:14 AM Scope Out: 9:47:42 AM Scope Withdrawal Time: 0 hours 11 minutes 18 seconds  Total Procedure Duration: 0 hours 13 minutes 28 seconds  Findings:                 The perianal and digital rectal examinations were                            normal.                           An area of melanosis was found in the entire colon.                           A diminutive polyp was found in the transverse                            colon. The polyp was semi-sessile. The polyp was                            removed with a cold snare. Resection and retrieval  were complete.                           Internal hemorrhoids were found.                           The exam was otherwise without abnormality on                            direct and retroflexion views. Complications:            No immediate complications. Estimated Blood Loss:     Estimated blood loss was minimal. Impression:               - Melanosis in the colon.                           - One diminutive polyp in the transverse colon,                            removed with a cold snare. Resected and retrieved.                           - Internal hemorrhoids.                           - The examination was otherwise normal on direct                            and retroflexion views. Recommendation:           - Patient has a contact number available for                            emergencies. The signs and symptoms of potential                             delayed complications were discussed with the                            patient. Return to normal activities tomorrow.                            Written discharge instructions were provided to the                            patient.                           - Resume previous diet.                           - Continue present medications.                           - Await pathology results.                           -  Repeat colonoscopy is recommended for                            surveillance. The colonoscopy date will be                            determined after pathology results from today's                            exam become available for review. Thomasenia Dowse L. Loletha Carrow, MD 06/14/2020 9:51:46 AM This report has been signed electronically.

## 2020-06-14 NOTE — Progress Notes (Signed)
Check-in-jb  Vital signs-cw  Information provided by sister who is patient poa.

## 2020-06-14 NOTE — Progress Notes (Signed)
To pacu, VSS. Report to [email or web address].tb 

## 2020-06-14 NOTE — Patient Instructions (Signed)
Handouts provided on polyps and hemorrhoids.   OU HAD AN ENDOSCOPIC PROCEDURE TODAY AT [facility]:   Refer to the procedure report that was given to you for any specific questions about what was found during the examination.  If the procedure report does not answer your questions, please call your gastroenterologist to clarify.  If you requested that your care partner not be given the details of your procedure findings, then the procedure report has been included in a sealed envelope for you to review at your convenience later.  YOU SHOULD EXPECT: Some feelings of bloating in the abdomen. Passage of more gas than usual.  Walking can help get rid of the air that was put into your GI tract during the procedure and reduce the bloating. If you had a lower endoscopy (such as a colonoscopy or flexible sigmoidoscopy) you may notice spotting of blood in your stool or on the toilet paper. If you underwent a bowel prep for your procedure, you may not have a normal bowel movement for a few days.  Please Note:  You might notice some irritation and congestion in your nose or some drainage.  This is from the oxygen used during your procedure.  There is no need for concern and it should clear up in a day or so.  SYMPTOMS TO REPORT IMMEDIATELY:   Following lower endoscopy (colonoscopy or flexible sigmoidoscopy):  Excessive amounts of blood in the stool  Significant tenderness or worsening of abdominal pains  Swelling of the abdomen that is new, acute  Fever of 100F or higher  For urgent or emergent issues, a gastroenterologist can be reached at any hour by calling [phone]. Do not use MyChart messaging for urgent concerns.    DIET:  We do recommend a small meal at first, but then you may proceed to your regular diet.  Drink plenty of fluids but you should avoid alcoholic beverages for 24 hours.  ACTIVITY:  You should plan to take it easy for the rest of today and you should NOT DRIVE  or use heavy machinery until tomorrow (because of the sedation medicines used during the test).    FOLLOW UP: Our staff will call the number listed on your records 48-72 hours following your procedure to check on you and address any questions or concerns that you may have regarding the information given to you following your procedure. If we do not reach you, we will leave a message.  We will attempt to reach you two times.  During this call, we will ask if you have developed any symptoms of COVID 19. If you develop any symptoms (ie: fever, flu-like symptoms, shortness of breath, cough etc.) before then, please call [phone].  If you test positive for Covid 19 in the 2 weeks post procedure, please call and report this information to [place].    If any biopsies were taken you will be contacted by phone or by letter within the next 1-3 weeks.  Please call us at [phone] if you have not heard about the biopsies in 3 weeks.    SIGNATURES/CONFIDENTIALITY: You and/or your care partner have signed paperwork which will be entered into your electronic medical record.  These signatures attest to the fact that that the information above on your After Visit Summary has been reviewed and is understood.  Full responsibility of the confidentiality of this discharge information lies with you and/or your care-partner.

## 2020-06-14 NOTE — Progress Notes (Signed)
Called to room to assist during endoscopic procedure.  Patient ID and intended procedure confirmed with present staff. Received instructions for my participation in the procedure from the performing physician.  

## 2020-06-16 ENCOUNTER — Telehealth: Payer: Self-pay | Admitting: *Deleted

## 2020-06-16 NOTE — Telephone Encounter (Signed)
No answer for post procedure call back. Left message for patient to call back with questions or concerns.

## 2020-06-16 NOTE — Telephone Encounter (Signed)
Follow up call made. 

## 2020-06-17 ENCOUNTER — Encounter: Payer: Self-pay | Admitting: Gastroenterology

## 2020-07-19 ENCOUNTER — Other Ambulatory Visit: Payer: Self-pay | Admitting: Vascular Surgery

## 2020-07-19 ENCOUNTER — Ambulatory Visit
Admission: RE | Admit: 2020-07-19 | Discharge: 2020-07-19 | Disposition: A | Discharge status: Home / Self Care | Payer: Medicare (Managed Care) | Source: Ambulatory Visit | Attending: Vascular Surgery | Admitting: Vascular Surgery

## 2020-07-19 DIAGNOSIS — M25451 Effusion, right hip: Secondary | ICD-10-CM

## 2020-07-19 DIAGNOSIS — Z0001 Encounter for general adult medical examination with abnormal findings: Secondary | ICD-10-CM

## 2020-08-16 ENCOUNTER — Other Ambulatory Visit: Payer: Self-pay | Admitting: Vascular Surgery

## 2020-08-16 DIAGNOSIS — Z1231 Encounter for screening mammogram for malignant neoplasm of breast: Secondary | ICD-10-CM

## 2020-10-05 ENCOUNTER — Ambulatory Visit
Admission: RE | Admit: 2020-10-05 | Discharge: 2020-10-05 | Disposition: A | Discharge status: Home / Self Care | Payer: Medicare (Managed Care) | Source: Ambulatory Visit | Attending: Vascular Surgery | Admitting: Vascular Surgery

## 2020-10-05 ENCOUNTER — Other Ambulatory Visit: Payer: Self-pay

## 2020-10-05 DIAGNOSIS — Z1231 Encounter for screening mammogram for malignant neoplasm of breast: Secondary | ICD-10-CM

## 2020-10-11 ENCOUNTER — Other Ambulatory Visit: Payer: Self-pay | Admitting: Vascular Surgery

## 2020-10-11 DIAGNOSIS — Z1231 Encounter for screening mammogram for malignant neoplasm of breast: Secondary | ICD-10-CM

## 2020-11-22 ENCOUNTER — Other Ambulatory Visit: Payer: Medicare (Managed Care)

## 2020-11-30 ENCOUNTER — Other Ambulatory Visit: Payer: Medicare (Managed Care)

## 2021-04-04 ENCOUNTER — Emergency Department (HOSPITAL_COMMUNITY)
Admission: EM | Admit: 2021-04-04 | Discharge: 2021-04-04 | Disposition: A | Discharge status: Home / Self Care | Payer: Medicare (Managed Care) | Attending: Emergency Medicine | Admitting: Emergency Medicine

## 2021-04-04 ENCOUNTER — Emergency Department (HOSPITAL_COMMUNITY): Payer: Medicare (Managed Care)

## 2021-04-04 ENCOUNTER — Other Ambulatory Visit: Payer: Self-pay

## 2021-04-04 DIAGNOSIS — S2241XA Multiple fractures of ribs, right side, initial encounter for closed fracture: Secondary | ICD-10-CM | POA: Insufficient documentation

## 2021-04-04 DIAGNOSIS — W010XXA Fall on same level from slipping, tripping and stumbling without subsequent striking against object, initial encounter: Secondary | ICD-10-CM | POA: Diagnosis not present

## 2021-04-04 DIAGNOSIS — R1031 Right lower quadrant pain: Secondary | ICD-10-CM | POA: Insufficient documentation

## 2021-04-04 DIAGNOSIS — I700 Atherosclerosis of aorta: Secondary | ICD-10-CM | POA: Insufficient documentation

## 2021-04-04 DIAGNOSIS — S299XXA Unspecified injury of thorax, initial encounter: Secondary | ICD-10-CM | POA: Diagnosis present

## 2021-04-04 DIAGNOSIS — R0781 Pleurodynia: Secondary | ICD-10-CM | POA: Diagnosis present

## 2021-04-04 DIAGNOSIS — R1011 Right upper quadrant pain: Secondary | ICD-10-CM | POA: Diagnosis not present

## 2021-04-04 DIAGNOSIS — Z7982 Long term (current) use of aspirin: Secondary | ICD-10-CM | POA: Insufficient documentation

## 2021-04-04 LAB — URINALYSIS, ROUTINE W REFLEX MICROSCOPIC
Bilirubin Urine: NEGATIVE
Glucose, UA: NEGATIVE mg/dL
Hgb urine dipstick: NEGATIVE
Ketones, ur: NEGATIVE mg/dL
Leukocytes,Ua: NEGATIVE
Nitrite: NEGATIVE
Protein, ur: NEGATIVE mg/dL
Specific Gravity, Urine: 1.023 (ref 1.005–1.030)
pH: 5 (ref 5.0–8.0)

## 2021-04-04 LAB — COMPREHENSIVE METABOLIC PANEL
ALT: 13 U/L (ref 0–44)
AST: 17 U/L (ref 15–41)
Albumin: 3.7 g/dL (ref 3.5–5.0)
Alkaline Phosphatase: 72 U/L (ref 38–126)
Anion gap: 6 (ref 5–15)
BUN: 12 mg/dL (ref 8–23)
CO2: 22 mmol/L (ref 22–32)
Calcium: 9 mg/dL (ref 8.9–10.3)
Chloride: 111 mmol/L (ref 98–111)
Creatinine, Ser: 0.8 mg/dL (ref 0.44–1.00)
GFR, Estimated: >60 mL/min (ref >60)
Glucose, Bld: 99 mg/dL (ref 70–99)
Potassium: 3.6 mmol/L (ref 3.5–5.1)
Sodium: 139 mmol/L (ref 135–145)
Total Bilirubin: 0.6 mg/dL (ref 0.3–1.2)
Total Protein: 7.6 g/dL (ref 6.5–8.1)

## 2021-04-04 LAB — CBC WITH DIFFERENTIAL/PLATELET
Abs Immature Granulocytes: 0.01 10*3/uL (ref 0.00–0.07)
Basophils Absolute: 0 10*3/uL (ref 0.0–0.1)
Basophils Relative: 0 %
Eosinophils Absolute: 0.5 10*3/uL (ref 0.0–0.5)
Eosinophils Relative: 8 %
HCT: 34.5 % — ABNORMAL LOW (ref 36.0–46.0)
Hemoglobin: 12 g/dL (ref 12.0–15.0)
Immature Granulocytes: 0 %
Lymphocytes Relative: 25 %
Lymphs Abs: 1.8 10*3/uL (ref 0.7–4.0)
MCH: 30.8 pg (ref 26.0–34.0)
MCHC: 34.8 g/dL (ref 30.0–36.0)
MCV: 88.7 fL (ref 80.0–100.0)
Monocytes Absolute: 0.8 10*3/uL (ref 0.1–1.0)
Monocytes Relative: 11 %
Neutro Abs: 3.9 10*3/uL (ref 1.7–7.7)
Neutrophils Relative %: 56 %
Platelets: 242 10*3/uL (ref 150–400)
RBC: 3.89 MIL/uL (ref 3.87–5.11)
RDW: 12.5 % (ref 11.5–15.5)
WBC: 7 10*3/uL (ref 4.0–10.5)
nRBC: 0 % (ref 0.0–0.2)

## 2021-04-04 LAB — LIPASE, BLOOD: Lipase: 46 U/L (ref 11–51)

## 2021-04-04 MED ORDER — FENTANYL CITRATE PF 50 MCG/ML IJ SOSY
50.0000 ug | PREFILLED_SYRINGE | Freq: Once | INTRAMUSCULAR | Status: AC
  Administered 2021-04-04: 50 ug via INTRAVENOUS
  Filled 2021-04-04: qty 1

## 2021-04-04 MED ORDER — HYDROCODONE-ACETAMINOPHEN 5-325 MG PO TABS: 1.0000 | ORAL_TABLET | Freq: Four times a day (QID) | ORAL | 0 refills | Status: AC | PRN

## 2021-04-04 MED ORDER — SODIUM CHLORIDE 0.9 % IV BOLUS
1000.0000 mL | Freq: Once | INTRAVENOUS | Status: AC
  Administered 2021-04-04: 1000 mL via INTRAVENOUS

## 2021-04-04 MED ORDER — IOHEXOL 300 MG/ML  SOLN
100.0000 mL | Freq: Once | INTRAMUSCULAR | Status: AC | PRN
  Administered 2021-04-04: 100 mL via INTRAVENOUS

## 2021-04-04 MED ORDER — OXYCODONE-ACETAMINOPHEN 5-325 MG PO TABS
1.0000 | ORAL_TABLET | Freq: Once | ORAL | Status: AC
  Administered 2021-04-04: 1 via ORAL
  Filled 2021-04-04: qty 1

## 2021-04-04 MED ORDER — LIDOCAINE 5 % EX PTCH
1.0000 | MEDICATED_PATCH | CUTANEOUS | Status: DC
  Administered 2021-04-04: 1 via TRANSDERMAL
  Filled 2021-04-04: qty 1

## 2021-04-04 NOTE — Discharge Instructions (Addendum)
You were seen in the emergency department for right sided chest/abdominal pain. Your labs were reassuring. Your CT scan showed fractures of your 6th and 7th ribs which is likely the cause of your pain.  You did have what appears to probably be an old eighth thoracic vertebral fracture as well that was noted.  We are sending you home with Norco to take for severe pain. ? ?-Norco-this is a narcotic/controlled substance medication that has potential addicting qualities.  We recommend that you take 1 tablet every 6 hours as needed for severe pain.  Do not drive or operate heavy machinery when taking this medicine as it can be sedating. Do not drink alcohol or take other sedating medications when taking this medicine for safety reasons.  Keep this out of reach of small children.  Please be aware this medicine has Tylenol in it (325 mg/tab) do not exceed the maximum dose of Tylenol in a day per over the counter recommendations should you decide to supplement with Tylenol over the counter.  ? ? ?We have prescribed you new medication(s) today. Discuss the medications prescribed today with your pharmacist as they can have adverse effects and interactions with your other medicines including over the counter and prescribed medications. Seek medical evaluation if you start to experience new or abnormal symptoms after taking one of these medicines, seek care immediately if you start to experience difficulty breathing, feeling of your throat closing, facial swelling, or rash as these could be indications of a more serious allergic reaction ? ? ?Please use the incentive spirometer every other hour to help try to maintain your lung capacity and to help prevent pneumonia. ? ?Please follow attached handout. ? ?Please follow-up with primary care within 3 days.  Return to the emergency department for new or worsening symptoms including but not limited to new or worsening pain, fever, trouble breathing, passing out, coughing up blood,  coughing up thick mucus sputum, blood in stool or urine, or any other concerns. ?

## 2021-04-04 NOTE — ED Notes (Signed)
Pt provided education on discharge instructions ?

## 2021-04-04 NOTE — ED Notes (Signed)
Pt able to ambulate with saturation maintaining between 93-99% EDP notified  ?

## 2021-04-04 NOTE — ED Notes (Signed)
Brother in [place] [phone] ?

## 2021-04-04 NOTE — ED Triage Notes (Signed)
Pt BIB EMS from home for RUQ abd pain. Pt has also fallen 2 times in the past 2-3 days. No LOC or bruising  ? ?Hx Stroke non-verbal  ? ?VSS ?

## 2021-04-04 NOTE — ED Provider Notes (Signed)
?[facility] ?Provider Note ? ? ?CSN: [id number] ?Arrival date & time: 04/04/21  0134 ? ?  ? ?History ? ?Chief Complaint  ?Patient presents with  ? Abdominal Pain  ? ? ?Tina Sanders is a 66 y.o. female with a hx of prior CVA w/ residual dysarthria, sickle cell anemia, IBS, and dementia who presents to the ED with complaints of right sided chest/abdominal pain S/p fall x 2 in the last few days. Patient with speech deficits @ baseline S/p CVA, able to provide some history- complaining of right side pain to the chest/abdomen, worse with movement/certain positions, no alleviating factors. No intervention PTA. Denies other ares of injury. Specifically denies head injury or LOC. Hx limited secondary to dysarthria. Per EMS fall x 2, once she slipped the other tripped and fell onto the bath tub. Lives @ home with family who are coming to the ED.  ? ?HPI ? ?  ? ?Home Medications ?Prior to Admission medications   ?Medication Sig Start Date End Date Taking? Authorizing Provider  ?acetaminophen (TYLENOL) 500 MG tablet Take 500-1,000 mg every 6 (six) hours as needed by mouth.    [provider]  ?aspirin EC 81 MG tablet Take 81 mg by mouth daily.    [provider]  ?atorvastatin (LIPITOR) 10 MG tablet Take 10 mg by mouth daily at 6 PM.    [provider]  ?calcium carbonate (OS-CAL - DOSED IN MG OF ELEMENTAL CALCIUM) 1250 (500 Ca) MG tablet Take 1 tablet (500 mg of elemental calcium total) by mouth daily with breakfast. 02/27/19   Raaga Maeder R, PA-C  ?cetirizine (ZYRTEC) 10 MG tablet Take 10 mg by mouth daily.    [provider]  ?lacosamide (VIMPAT) 200 MG TABS tablet Take 1 tablet (200 mg total) by mouth 2 (two) times daily. 08/28/16   Raiford Noble Latif, DO  ?levETIRAcetam (KEPPRA) 750 MG tablet Take 1 tablet (750 mg total) by mouth 2 (two) times daily. 06/09/16   Penumalli, Earlean Polka, MD  ?lisinopril (ZESTRIL) 10 MG tablet Take 10 mg by mouth  daily.    [provider]  ?Menthol, Topical Analgesic, (BIOFREEZE EX) Apply topically. Uses BID prn    [provider]  ?mirtazapine (REMERON) 15 MG tablet Take 15 mg at bedtime by mouth.    [provider]  ?OVER THE COUNTER MEDICATION Laxative plu: 2 tabs once daily at bedtime for constipation    [provider]  ?potassium chloride SA (KLOR-CON) 20 MEQ tablet Take 1 tablet (20 mEq total) by mouth daily. 02/27/19   Memory Heinrichs, Glynda Jaeger, PA-C  ?UNKNOWN TO PATIENT Protein shake by mouth twice daily.    [provider]  ?   ? ?Allergies    ?Penicillins and Nicotine   ? ?Review of Systems   ?Review of Systems  ?Cardiovascular:  Positive for chest pain (right side ribs).  ?Gastrointestinal:  Positive for abdominal pain. Negative for diarrhea and vomiting.  ?Musculoskeletal:  Negative for back pain and neck pain.  ?Neurological:  Negative for headaches.  ?All other systems reviewed and are negative. ? ?Physical Exam ?Updated Vital Signs ?BP 112/61   Pulse 78   Resp 20   SpO2 95%  ?Physical Exam ?Vitals and nursing note reviewed.  ?Constitutional:   ?   General: She is not in acute distress. ?   Appearance: She is well-developed.  ?HENT:  ?   Head: Normocephalic and atraumatic. No raccoon eyes or Battle's sign.  ?  Right Ear: No hemotympanum.  ?   Left Ear: No hemotympanum.  ?Eyes:  ?   General:     ?   Right eye: No discharge.     ?   Left eye: No discharge.  ?   Conjunctiva/sclera: Conjunctivae normal.  ?   Pupils: Pupils are equal, round, and reactive to light.  ?Cardiovascular:  ?   Rate and Rhythm: Normal rate and regular rhythm.  ?   Heart sounds: No murmur heard. ?Pulmonary:  ?   Effort: No respiratory distress.  ?   Breath sounds: No wheezing or rales.  ?Chest:  ?   Chest wall: Tenderness (right lateral chest wall) present.  ?   Comments: No ecchymosis or crepitus noted.  ?Abdominal:  ?   General: There is no distension.  ?   Palpations: Abdomen is soft.  ?    Tenderness: There is abdominal tenderness in the right upper quadrant and right lower quadrant.  ?Musculoskeletal:  ?   Cervical back: Normal range of motion and neck supple. No spinous process tenderness.  ?   Comments: Ues/Les: no obvious deformities, ranging @ all major joints. No focal bony tenderness.  ?Back: No point/focal vertebral tenderness or step off.   ?Skin: ?   General: Skin is warm and dry.  ?   Findings: No rash.  ?Neurological:  ?   Comments: Moving all extremities. Alert.   ?Psychiatric:     ?   Behavior: Behavior normal.  ? ? ?ED Results / Procedures / Treatments   ?Labs ?(all labs ordered are listed, but only abnormal results are displayed) ?Labs Reviewed  ?CBC WITH DIFFERENTIAL/PLATELET - Abnormal; Notable for the following components:  ?    Result Value  ? HCT 34.5 (*)   ? All other components within normal limits  ?COMPREHENSIVE METABOLIC PANEL  ?LIPASE, BLOOD  ?URINALYSIS, ROUTINE W REFLEX MICROSCOPIC  ? ? ?EKG ?EKG Interpretation ? ?Date/Time:  Monday April 04 2021 01:48:11 EDT ?Ventricular Rate:  71 ?PR Interval:  183 ?QRS Duration: 95 ?QT Interval:  395 ?QTC Calculation: 430 ?R Axis:   268 ?Text Interpretation: Sinus rhythm Left anterior fascicular block No acute changes from priors Confirmed by Addison Lank [phone]) on 04/04/2021 3:15:49 AM ? ?Radiology ?CT CHEST ABDOMEN PELVIS W CONTRAST ? ?Result Date: 04/04/2021 ?CLINICAL DATA:  Trauma. EXAM: CT CHEST, ABDOMEN, AND PELVIS WITH CONTRAST TECHNIQUE: Multidetector CT imaging of the chest, abdomen and pelvis was performed following the standard protocol during bolus administration of intravenous contrast. RADIATION DOSE REDUCTION: This exam was performed according to the departmental dose-optimization program which includes automated exposure control, adjustment of the mA and/or kV according to patient size and/or use of iterative reconstruction technique. CONTRAST:  173m OMNIPAQUE IOHEXOL 300 MG/ML  SOLN COMPARISON:  None. FINDINGS: CT  CHEST FINDINGS Cardiovascular: There is no cardiomegaly or pericardial effusion. Coronary vascular calcification predominantly involving the LAD. Mild atherosclerotic calcification of the thoracic aorta. No aneurysmal dilatation or dissection. The central pulmonary arteries are patent. Mediastinum/Nodes: No hilar or mediastinal adenopathy. The esophagus is grossly unremarkable. No mediastinal fluid collection. Lungs/Pleura: Emphysema. Bibasilar subpleural atelectasis/scarring. No lobar consolidation, pleural effusion, or pneumothorax. The central airways are patent. Musculoskeletal: Mildly displaced fractures of the anterolateral right sixth and seventh ribs. Age indeterminate, old-appearing compression fracture of T8 with approximately 50% loss of vertebral body height and anterior wedging. CT ABDOMEN PELVIS FINDINGS No intra-abdominal free air or free fluid. Hepatobiliary: Probable mild fatty liver. No intrahepatic biliary dilatation. The gallbladder is unremarkable.  Pancreas: Unremarkable. No pancreatic ductal dilatation or surrounding inflammatory changes. Spleen: Normal in size without focal abnormality. Adrenals/Urinary Tract: The adrenal glands are unremarkable. Mild left renal parenchyma atrophy and cortical scarring. There is no hydronephrosis on either side. Small bilateral renal cysts and additional subcentimeter hypodense lesions which are too small to characterize. The visualized ureters and urinary bladder appear unremarkable. Stomach/Bowel: There is no bowel obstruction or active inflammation. The appendix is normal. Vascular/Lymphatic: Moderate aortoiliac atherosclerotic disease. The IVC is unremarkable. No portal venous gas. There is no adenopathy. Reproductive: Hysterectomy.  No adnexal masses. Other: None Musculoskeletal: Osteopenia.  No acute osseous pathology. IMPRESSION: 1. Mildly displaced fractures of the anterolateral right sixth and seventh ribs. No pneumothorax. 2. Age indeterminate,  old-appearing compression fracture of T8 with approximately 50% loss of vertebral body height and anterior wedging. 3. No acute intra-abdominal or pelvic pathology. 4. Aortic Atherosclerosis (ICD10-I70.0). Electroni

## 2021-04-04 NOTE — ED Notes (Signed)
Pt's brother in law updated with pt's permission  ?

## 2021-09-23 ENCOUNTER — Other Ambulatory Visit: Payer: Self-pay | Admitting: Vascular Surgery

## 2021-09-23 DIAGNOSIS — Z1231 Encounter for screening mammogram for malignant neoplasm of breast: Secondary | ICD-10-CM

## 2021-10-06 ENCOUNTER — Ambulatory Visit: Payer: Medicare (Managed Care)

## 2021-10-26 ENCOUNTER — Ambulatory Visit
Admission: RE | Admit: 2021-10-26 | Discharge: 2021-10-26 | Disposition: A | Discharge status: Home / Self Care | Payer: Medicare (Managed Care) | Source: Ambulatory Visit | Attending: Vascular Surgery | Admitting: Vascular Surgery

## 2021-10-26 DIAGNOSIS — Z1231 Encounter for screening mammogram for malignant neoplasm of breast: Secondary | ICD-10-CM

## 2022-07-10 ENCOUNTER — Other Ambulatory Visit: Payer: Self-pay | Admitting: Vascular Surgery

## 2022-07-10 ENCOUNTER — Encounter: Payer: Self-pay | Admitting: Vascular Surgery

## 2022-07-10 DIAGNOSIS — Z1231 Encounter for screening mammogram for malignant neoplasm of breast: Secondary | ICD-10-CM

## 2022-10-30 ENCOUNTER — Ambulatory Visit
Admission: RE | Admit: 2022-10-30 | Discharge: 2022-10-30 | Disposition: A | Discharge status: Home / Self Care | Payer: Medicare (Managed Care) | Source: Ambulatory Visit | Attending: Vascular Surgery | Admitting: Vascular Surgery

## 2022-10-30 DIAGNOSIS — Z1231 Encounter for screening mammogram for malignant neoplasm of breast: Secondary | ICD-10-CM

## 2023-10-26 ENCOUNTER — Other Ambulatory Visit: Payer: Self-pay | Admitting: Vascular Surgery

## 2023-10-26 ENCOUNTER — Other Ambulatory Visit: Payer: Self-pay | Admitting: Nurse Practitioner

## 2023-10-26 ENCOUNTER — Other Ambulatory Visit: Payer: Self-pay

## 2023-10-26 DIAGNOSIS — Z1231 Encounter for screening mammogram for malignant neoplasm of breast: Secondary | ICD-10-CM

## 2023-11-21 ENCOUNTER — Ambulatory Visit
Admission: RE | Admit: 2023-11-21 | Discharge: 2023-11-21 | Disposition: A | Discharge status: Home / Self Care | Payer: Medicare (Managed Care) | Source: Ambulatory Visit | Attending: Vascular Surgery | Admitting: Vascular Surgery

## 2023-11-21 DIAGNOSIS — Z1231 Encounter for screening mammogram for malignant neoplasm of breast: Secondary | ICD-10-CM
